# Patient Record
Sex: Male | Born: 1945 | Race: Black or African American | Hispanic: No | Marital: Married | Smoking: Former smoker
Health system: Southern US, Community
[De-identification: ages and names within clinical notes are randomized; demographics above are authoritative.]

## PROBLEM LIST (undated history)

## (undated) DIAGNOSIS — K219 Gastro-esophageal reflux disease without esophagitis: Secondary | ICD-10-CM

## (undated) DIAGNOSIS — Z860100 Personal history of colon polyps, unspecified: Secondary | ICD-10-CM

## (undated) DIAGNOSIS — F32A Depression, unspecified: Secondary | ICD-10-CM

## (undated) DIAGNOSIS — N400 Enlarged prostate without lower urinary tract symptoms: Secondary | ICD-10-CM

## (undated) DIAGNOSIS — F329 Major depressive disorder, single episode, unspecified: Secondary | ICD-10-CM

## (undated) DIAGNOSIS — K317 Polyp of stomach and duodenum: Secondary | ICD-10-CM

## (undated) DIAGNOSIS — K5790 Diverticulosis of intestine, part unspecified, without perforation or abscess without bleeding: Secondary | ICD-10-CM

## (undated) DIAGNOSIS — F419 Anxiety disorder, unspecified: Secondary | ICD-10-CM

## (undated) DIAGNOSIS — Z87442 Personal history of urinary calculi: Secondary | ICD-10-CM

## (undated) DIAGNOSIS — Z8719 Personal history of other diseases of the digestive system: Secondary | ICD-10-CM

## (undated) DIAGNOSIS — Z8679 Personal history of other diseases of the circulatory system: Secondary | ICD-10-CM

## (undated) DIAGNOSIS — E785 Hyperlipidemia, unspecified: Secondary | ICD-10-CM

## (undated) DIAGNOSIS — N529 Male erectile dysfunction, unspecified: Secondary | ICD-10-CM

## (undated) DIAGNOSIS — C61 Malignant neoplasm of prostate: Secondary | ICD-10-CM

## (undated) DIAGNOSIS — I10 Essential (primary) hypertension: Secondary | ICD-10-CM

## (undated) DIAGNOSIS — Z9889 Other specified postprocedural states: Secondary | ICD-10-CM

## (undated) HISTORY — DX: Personal history of colonic polyps: Z86.010

## (undated) HISTORY — DX: Major depressive disorder, single episode, unspecified: F32.9

## (undated) HISTORY — PX: KNEE ARTHROSCOPY: SUR90

## (undated) HISTORY — DX: Personal history of urinary calculi: Z87.442

## (undated) HISTORY — DX: Depression, unspecified: F32.A

## (undated) HISTORY — DX: Diverticulosis of intestine, part unspecified, without perforation or abscess without bleeding: K57.90

## (undated) HISTORY — DX: Hyperlipidemia, unspecified: E78.5

## (undated) HISTORY — DX: Personal history of colon polyps, unspecified: Z86.0100

## (undated) HISTORY — DX: Personal history of other diseases of the circulatory system: Z86.79

## (undated) HISTORY — DX: Gastro-esophageal reflux disease without esophagitis: K21.9

## (undated) HISTORY — DX: Benign prostatic hyperplasia without lower urinary tract symptoms: N40.0

## (undated) HISTORY — PX: ESOPHAGEAL DILATION: SHX303

## (undated) HISTORY — DX: Polyp of stomach and duodenum: K31.7

## (undated) HISTORY — DX: Other specified postprocedural states: Z98.890

## (undated) HISTORY — PX: TONSILLECTOMY: SUR1361

## (undated) HISTORY — DX: Anxiety disorder, unspecified: F41.9

## (undated) HISTORY — DX: Personal history of other diseases of the digestive system: Z87.19

## (undated) HISTORY — PX: CARPAL TUNNEL RELEASE: SHX101

## (undated) HISTORY — PX: INGUINAL HERNIA REPAIR: SUR1180

---

## 2001-09-07 ENCOUNTER — Emergency Department (HOSPITAL_COMMUNITY): Admission: EM | Admit: 2001-09-07 | Discharge: 2001-09-07 | Payer: Self-pay | Admitting: Emergency Medicine

## 2001-09-07 ENCOUNTER — Encounter: Payer: Self-pay | Admitting: Emergency Medicine

## 2002-06-15 ENCOUNTER — Ambulatory Visit (HOSPITAL_COMMUNITY): Admission: RE | Admit: 2002-06-15 | Discharge: 2002-06-15 | Payer: Self-pay | Admitting: Endocrinology

## 2002-06-15 ENCOUNTER — Encounter: Payer: Self-pay | Admitting: Endocrinology

## 2005-01-21 ENCOUNTER — Ambulatory Visit: Payer: Self-pay | Admitting: Internal Medicine

## 2005-01-25 ENCOUNTER — Ambulatory Visit (HOSPITAL_COMMUNITY): Admission: RE | Admit: 2005-01-25 | Discharge: 2005-01-25 | Payer: Self-pay | Admitting: Internal Medicine

## 2005-01-25 ENCOUNTER — Ambulatory Visit: Payer: Self-pay | Admitting: *Deleted

## 2005-01-25 ENCOUNTER — Ambulatory Visit: Payer: Self-pay

## 2005-04-15 ENCOUNTER — Ambulatory Visit: Payer: Self-pay | Admitting: Endocrinology

## 2005-04-22 ENCOUNTER — Ambulatory Visit: Payer: Self-pay | Admitting: Endocrinology

## 2005-05-12 ENCOUNTER — Ambulatory Visit: Payer: Self-pay | Admitting: Endocrinology

## 2005-06-27 ENCOUNTER — Ambulatory Visit: Payer: Self-pay | Admitting: Endocrinology

## 2006-02-20 ENCOUNTER — Ambulatory Visit: Payer: Self-pay | Admitting: Endocrinology

## 2006-04-19 ENCOUNTER — Ambulatory Visit: Payer: Self-pay | Admitting: Endocrinology

## 2006-04-25 ENCOUNTER — Ambulatory Visit: Payer: Self-pay | Admitting: Endocrinology

## 2007-02-19 ENCOUNTER — Ambulatory Visit: Payer: Self-pay | Admitting: Endocrinology

## 2007-02-20 ENCOUNTER — Ambulatory Visit: Payer: Self-pay | Admitting: Endocrinology

## 2007-02-20 LAB — CONVERTED CEMR LAB
ALT: 23 units/L (ref 0–40)
AST: 18 units/L (ref 0–37)
Albumin: 3.8 g/dL (ref 3.5–5.2)
Alkaline Phosphatase: 47 units/L (ref 39–117)
BUN: 12 mg/dL (ref 6–23)
Basophils Absolute: 0 10*3/uL (ref 0.0–0.1)
Basophils Relative: 1 % (ref 0.0–1.0)
Bilirubin Urine: NEGATIVE
Bilirubin, Direct: 0.2 mg/dL (ref 0.0–0.3)
CO2: 32 meq/L (ref 19–32)
Calcium: 9.3 mg/dL (ref 8.4–10.5)
Chloride: 102 meq/L (ref 96–112)
Cholesterol: 171 mg/dL (ref 0–200)
Creatinine, Ser: 1.1 mg/dL (ref 0.4–1.5)
Eosinophils Absolute: 0.2 10*3/uL (ref 0.0–0.6)
Eosinophils Relative: 4.8 % (ref 0.0–5.0)
GFR calc Af Amer: 88 mL/min
GFR calc non Af Amer: 72 mL/min
Glucose, Bld: 95 mg/dL (ref 70–99)
HCT: 40.8 % (ref 39.0–52.0)
HDL: 39.7 mg/dL (ref >39.0)
Hemoglobin, Urine: NEGATIVE
Hemoglobin: 14.3 g/dL (ref 13.0–17.0)
Ketones, ur: NEGATIVE mg/dL
LDL Cholesterol: 104 mg/dL — ABNORMAL HIGH (ref 0–99)
Leukocytes, UA: NEGATIVE
Lymphocytes Relative: 24.4 % (ref 12.0–46.0)
MCHC: 35.1 g/dL (ref 30.0–36.0)
MCV: 82.6 fL (ref 78.0–100.0)
Monocytes Absolute: 0.3 10*3/uL (ref 0.2–0.7)
Monocytes Relative: 6.3 % (ref 3.0–11.0)
Neutro Abs: 2.8 10*3/uL (ref 1.4–7.7)
Neutrophils Relative %: 63.5 % (ref 43.0–77.0)
Nitrite: NEGATIVE
PSA: 2.25 ng/mL (ref 0.10–4.00)
Platelets: 178 10*3/uL (ref 150–400)
Potassium: 4.2 meq/L (ref 3.5–5.1)
RBC: 4.93 M/uL (ref 4.22–5.81)
RDW: 11.8 % (ref 11.5–14.6)
Sodium: 137 meq/L (ref 135–145)
Specific Gravity, Urine: 1.02 (ref 1.000–1.03)
TSH: 0.82 microintl units/mL (ref 0.35–5.50)
Total Bilirubin: 1 mg/dL (ref 0.3–1.2)
Total CHOL/HDL Ratio: 4.3
Total Protein, Urine: NEGATIVE mg/dL
Total Protein: 6.1 g/dL (ref 6.0–8.3)
Triglycerides: 139 mg/dL (ref 0–149)
Urine Glucose: NEGATIVE mg/dL
Urobilinogen, UA: 0.2 (ref 0.0–1.0)
VLDL: 28 mg/dL (ref 0–40)
Vitamin B-12: 762 pg/mL (ref 211–911)
WBC: 4.3 10*3/uL — ABNORMAL LOW (ref 4.5–10.5)
pH: 7 (ref 5.0–8.0)

## 2007-02-26 ENCOUNTER — Ambulatory Visit: Payer: Self-pay | Admitting: Cardiology

## 2007-05-02 ENCOUNTER — Ambulatory Visit: Payer: Self-pay | Admitting: Endocrinology

## 2007-05-02 LAB — CONVERTED CEMR LAB
ALT: 21 units/L (ref 0–40)
AST: 17 units/L (ref 0–37)
Albumin: 4.1 g/dL (ref 3.5–5.2)
Alkaline Phosphatase: 51 units/L (ref 39–117)
BUN: 13 mg/dL (ref 6–23)
Basophils Absolute: 0 10*3/uL (ref 0.0–0.1)
Basophils Relative: 0 % (ref 0.0–1.0)
Bilirubin Urine: NEGATIVE
Bilirubin, Direct: 0.2 mg/dL (ref 0.0–0.3)
CO2: 32 meq/L (ref 19–32)
Calcium: 9.4 mg/dL (ref 8.4–10.5)
Chloride: 104 meq/L (ref 96–112)
Cholesterol: 177 mg/dL (ref 0–200)
Creatinine, Ser: 1.1 mg/dL (ref 0.4–1.5)
Eosinophils Absolute: 0.2 10*3/uL (ref 0.0–0.6)
Eosinophils Relative: 4.3 % (ref 0.0–5.0)
GFR calc Af Amer: 88 mL/min
GFR calc non Af Amer: 72 mL/min
Glucose, Bld: 93 mg/dL (ref 70–99)
HCT: 42.2 % (ref 39.0–52.0)
HDL: 42.9 mg/dL (ref >39.0)
Hemoglobin, Urine: NEGATIVE
Hemoglobin: 14.6 g/dL (ref 13.0–17.0)
Ketones, ur: NEGATIVE mg/dL
LDL Cholesterol: 112 mg/dL — ABNORMAL HIGH (ref 0–99)
Leukocytes, UA: NEGATIVE
Lymphocytes Relative: 32.6 % (ref 12.0–46.0)
MCHC: 34.7 g/dL (ref 30.0–36.0)
MCV: 83.4 fL (ref 78.0–100.0)
Monocytes Absolute: 0.3 10*3/uL (ref 0.2–0.7)
Monocytes Relative: 7.7 % (ref 3.0–11.0)
Neutro Abs: 2.1 10*3/uL (ref 1.4–7.7)
Neutrophils Relative %: 55.4 % (ref 43.0–77.0)
Nitrite: NEGATIVE
Platelets: 206 10*3/uL (ref 150–400)
Potassium: 4.4 meq/L (ref 3.5–5.1)
RBC: 5.05 M/uL (ref 4.22–5.81)
RDW: 11.8 % (ref 11.5–14.6)
Sodium: 140 meq/L (ref 135–145)
Specific Gravity, Urine: 1.025 (ref 1.000–1.03)
TSH: 1.18 microintl units/mL (ref 0.35–5.50)
Total Bilirubin: 0.9 mg/dL (ref 0.3–1.2)
Total CHOL/HDL Ratio: 4.1
Total Protein, Urine: NEGATIVE mg/dL
Total Protein: 6.3 g/dL (ref 6.0–8.3)
Triglycerides: 113 mg/dL (ref 0–149)
Urine Glucose: NEGATIVE mg/dL
Urobilinogen, UA: 0.2 (ref 0.0–1.0)
VLDL: 23 mg/dL (ref 0–40)
WBC: 3.8 10*3/uL — ABNORMAL LOW (ref 4.5–10.5)
pH: 6.5 (ref 5.0–8.0)

## 2007-05-07 ENCOUNTER — Ambulatory Visit: Payer: Self-pay | Admitting: Endocrinology

## 2007-07-16 ENCOUNTER — Encounter: Payer: Self-pay | Admitting: Endocrinology

## 2007-07-16 DIAGNOSIS — Z860100 Personal history of colon polyps, unspecified: Secondary | ICD-10-CM | POA: Insufficient documentation

## 2007-07-16 DIAGNOSIS — F329 Major depressive disorder, single episode, unspecified: Secondary | ICD-10-CM

## 2007-07-16 DIAGNOSIS — E785 Hyperlipidemia, unspecified: Secondary | ICD-10-CM

## 2007-09-10 ENCOUNTER — Ambulatory Visit: Payer: Self-pay | Admitting: Endocrinology

## 2007-10-02 ENCOUNTER — Telehealth (INDEPENDENT_AMBULATORY_CARE_PROVIDER_SITE_OTHER): Payer: Self-pay | Admitting: *Deleted

## 2007-10-09 ENCOUNTER — Telehealth: Payer: Self-pay | Admitting: Endocrinology

## 2007-10-09 DIAGNOSIS — K219 Gastro-esophageal reflux disease without esophagitis: Secondary | ICD-10-CM | POA: Insufficient documentation

## 2007-10-26 ENCOUNTER — Ambulatory Visit: Payer: Self-pay | Admitting: Gastroenterology

## 2007-10-31 ENCOUNTER — Encounter: Payer: Self-pay | Admitting: Gastroenterology

## 2007-10-31 ENCOUNTER — Ambulatory Visit: Payer: Self-pay | Admitting: Gastroenterology

## 2007-10-31 DIAGNOSIS — Z8719 Personal history of other diseases of the digestive system: Secondary | ICD-10-CM

## 2007-10-31 HISTORY — DX: Personal history of other diseases of the digestive system: Z87.19

## 2008-01-30 ENCOUNTER — Ambulatory Visit: Payer: Self-pay | Admitting: Endocrinology

## 2008-05-06 ENCOUNTER — Ambulatory Visit: Payer: Self-pay | Admitting: Endocrinology

## 2008-05-07 LAB — CONVERTED CEMR LAB
ALT: 27 U/L
AST: 21 U/L
Albumin: 3.8 g/dL
Alkaline Phosphatase: 44 U/L
BUN: 12 mg/dL
Basophils Absolute: 0 10*3/uL
Basophils Relative: 0.3 %
Bilirubin Urine: NEGATIVE
Bilirubin, Direct: 0.1 mg/dL
CO2: 31 meq/L
Calcium: 9.4 mg/dL
Chloride: 102 meq/L
Cholesterol: 145 mg/dL
Creatinine, Ser: 1.3 mg/dL
Eosinophils Absolute: 0.1 10*3/uL
Eosinophils Relative: 5.5 % — ABNORMAL HIGH
GFR calc Af Amer: 72 mL/min
GFR calc non Af Amer: 59 mL/min
Glucose, Bld: 97 mg/dL
HCT: 42.2 %
HDL: 36.6 mg/dL — ABNORMAL LOW
Hemoglobin, Urine: NEGATIVE
Hemoglobin: 14.5 g/dL
Ketones, ur: NEGATIVE mg/dL
LDL Cholesterol: 91 mg/dL
Leukocytes, UA: NEGATIVE
Lymphocytes Relative: 42 %
MCHC: 34.2 g/dL
MCV: 85.3 fL
Monocytes Absolute: 0.3 10*3/uL
Monocytes Relative: 9.5 %
Neutro Abs: 1.2 10*3/uL — ABNORMAL LOW
Neutrophils Relative %: 42.7 % — ABNORMAL LOW
Nitrite: NEGATIVE
PSA: 2.52 ng/mL
Platelets: 186 10*3/uL
Potassium: 4.5 meq/L
RBC: 4.95 M/uL
RDW: 12 %
Sodium: 142 meq/L
Specific Gravity, Urine: 1.015
TSH: 1.06 u[IU]/mL
Total Bilirubin: 1.1 mg/dL
Total CHOL/HDL Ratio: 4
Total Protein, Urine: NEGATIVE mg/dL
Total Protein: 6.1 g/dL
Triglycerides: 87 mg/dL
Urine Glucose: NEGATIVE mg/dL
Urobilinogen, UA: 0.2
VLDL: 17 mg/dL
WBC: 2.7 10*3/uL — ABNORMAL LOW
pH: 6.5

## 2008-05-15 ENCOUNTER — Ambulatory Visit: Payer: Self-pay | Admitting: Endocrinology

## 2008-06-11 ENCOUNTER — Ambulatory Visit: Payer: Self-pay | Admitting: Gastroenterology

## 2008-06-25 ENCOUNTER — Ambulatory Visit: Payer: Self-pay | Admitting: Gastroenterology

## 2009-05-22 ENCOUNTER — Ambulatory Visit: Payer: Self-pay | Admitting: Endocrinology

## 2009-05-23 LAB — CONVERTED CEMR LAB
ALT: 19 units/L (ref 0–53)
AST: 18 units/L (ref 0–37)
Albumin: 4.2 g/dL (ref 3.5–5.2)
Alkaline Phosphatase: 55 units/L (ref 39–117)
BUN: 13 mg/dL (ref 6–23)
Basophils Absolute: 0 10*3/uL (ref 0.0–0.1)
Basophils Relative: 1 % (ref 0.0–3.0)
Bilirubin Urine: NEGATIVE
Bilirubin, Direct: 0.2 mg/dL (ref 0.0–0.3)
CO2: 31 meq/L (ref 19–32)
Calcium: 9.3 mg/dL (ref 8.4–10.5)
Chloride: 103 meq/L (ref 96–112)
Cholesterol: 157 mg/dL (ref 0–200)
Creatinine, Ser: 1.2 mg/dL (ref 0.4–1.5)
Eosinophils Absolute: 0.1 10*3/uL (ref 0.0–0.7)
Eosinophils Relative: 4.6 % (ref 0.0–5.0)
GFR calc non Af Amer: 78.56 mL/min (ref >60)
Glucose, Bld: 85 mg/dL (ref 70–99)
HCT: 44.1 % (ref 39.0–52.0)
HDL: 39.3 mg/dL (ref >39.00)
Hemoglobin, Urine: NEGATIVE
Hemoglobin: 15.2 g/dL (ref 13.0–17.0)
Ketones, ur: NEGATIVE mg/dL
LDL Cholesterol: 92 mg/dL (ref 0–99)
Leukocytes, UA: NEGATIVE
Lymphocytes Relative: 37.7 % (ref 12.0–46.0)
Lymphs Abs: 1.1 10*3/uL (ref 0.7–4.0)
MCHC: 34.5 g/dL (ref 30.0–36.0)
MCV: 84 fL (ref 78.0–100.0)
Monocytes Absolute: 0.3 10*3/uL (ref 0.1–1.0)
Monocytes Relative: 8.7 % (ref 3.0–12.0)
Neutro Abs: 1.4 10*3/uL (ref 1.4–7.7)
Neutrophils Relative %: 48 % (ref 43.0–77.0)
Nitrite: NEGATIVE
PSA: 3.71 ng/mL (ref 0.10–4.00)
Platelets: 171 10*3/uL (ref 150.0–400.0)
Potassium: 4.2 meq/L (ref 3.5–5.1)
RBC: 5.25 M/uL (ref 4.22–5.81)
RDW: 11.8 % (ref 11.5–14.6)
Sodium: 142 meq/L (ref 135–145)
Specific Gravity, Urine: 1.02 (ref 1.000–1.030)
TSH: 0.84 microintl units/mL (ref 0.35–5.50)
Total Bilirubin: 1.4 mg/dL — ABNORMAL HIGH (ref 0.3–1.2)
Total CHOL/HDL Ratio: 4
Total Protein, Urine: NEGATIVE mg/dL
Total Protein: 6.7 g/dL (ref 6.0–8.3)
Triglycerides: 129 mg/dL (ref 0.0–149.0)
Urine Glucose: NEGATIVE mg/dL
Urobilinogen, UA: 0.2 (ref 0.0–1.0)
VLDL: 25.8 mg/dL (ref 0.0–40.0)
WBC: 2.9 10*3/uL — ABNORMAL LOW (ref 4.5–10.5)
pH: 6 (ref 5.0–8.0)

## 2009-05-27 ENCOUNTER — Ambulatory Visit: Payer: Self-pay | Admitting: Endocrinology

## 2009-05-27 DIAGNOSIS — D72819 Decreased white blood cell count, unspecified: Secondary | ICD-10-CM | POA: Insufficient documentation

## 2009-08-17 ENCOUNTER — Ambulatory Visit: Payer: Self-pay | Admitting: Endocrinology

## 2009-08-17 DIAGNOSIS — H8110 Benign paroxysmal vertigo, unspecified ear: Secondary | ICD-10-CM

## 2009-08-17 LAB — CONVERTED CEMR LAB
Basophils Relative: 0 % (ref 0.0–3.0)
CO2: 32 meq/L (ref 19–32)
Chloride: 105 meq/L (ref 96–112)
Eosinophils Relative: 6 % — ABNORMAL HIGH (ref 0.0–5.0)
Glucose, Bld: 91 mg/dL (ref 70–99)
Monocytes Relative: 9.7 % (ref 3.0–12.0)
Neutrophils Relative %: 39.4 % — ABNORMAL LOW (ref 43.0–77.0)
Platelets: 175 10*3/uL (ref 150.0–400.0)
Potassium: 4.1 meq/L (ref 3.5–5.1)
RBC: 5.26 M/uL (ref 4.22–5.81)
Sodium: 140 meq/L (ref 135–145)
WBC: 3.3 10*3/uL — ABNORMAL LOW (ref 4.5–10.5)

## 2009-09-01 ENCOUNTER — Ambulatory Visit: Payer: Self-pay | Admitting: Endocrinology

## 2009-09-01 LAB — CONVERTED CEMR LAB
Basophils Absolute: 0 10*3/uL (ref 0.0–0.1)
Eosinophils Absolute: 0.2 10*3/uL (ref 0.0–0.7)
HCT: 42.4 % (ref 39.0–52.0)
Hemoglobin: 14.3 g/dL (ref 13.0–17.0)
Lymphs Abs: 1.2 10*3/uL (ref 0.7–4.0)
MCHC: 33.8 g/dL (ref 30.0–36.0)
MCV: 84.5 fL (ref 78.0–100.0)
Monocytes Absolute: 0.3 10*3/uL (ref 0.1–1.0)
Monocytes Relative: 9.6 % (ref 3.0–12.0)
Neutro Abs: 1.5 10*3/uL (ref 1.4–7.7)
RDW: 12.6 % (ref 11.5–14.6)

## 2010-05-19 ENCOUNTER — Ambulatory Visit: Payer: Self-pay | Admitting: Endocrinology

## 2010-05-20 LAB — CONVERTED CEMR LAB
ALT: 16 units/L (ref 0–53)
AST: 18 units/L (ref 0–37)
Albumin: 4.2 g/dL (ref 3.5–5.2)
Basophils Absolute: 0 10*3/uL (ref 0.0–0.1)
Bilirubin Urine: NEGATIVE
Eosinophils Relative: 5.6 % — ABNORMAL HIGH (ref 0.0–5.0)
GFR calc non Af Amer: 72.69 mL/min (ref >60)
Glucose, Bld: 90 mg/dL (ref 70–99)
HCT: 41.4 % (ref 39.0–52.0)
Hemoglobin: 14.3 g/dL (ref 13.0–17.0)
Ketones, ur: NEGATIVE mg/dL
Leukocytes, UA: NEGATIVE
Lymphs Abs: 1.2 10*3/uL (ref 0.7–4.0)
Monocytes Relative: 9.2 % (ref 3.0–12.0)
Neutro Abs: 1.4 10*3/uL (ref 1.4–7.7)
Potassium: 4.6 meq/L (ref 3.5–5.1)
RDW: 13.6 % (ref 11.5–14.6)
Sodium: 139 meq/L (ref 135–145)
TSH: 0.8 microintl units/mL (ref 0.35–5.50)
Urobilinogen, UA: 0.2 (ref 0.0–1.0)
VLDL: 39.6 mg/dL (ref 0.0–40.0)

## 2010-05-28 ENCOUNTER — Ambulatory Visit: Payer: Self-pay | Admitting: Endocrinology

## 2010-07-26 ENCOUNTER — Ambulatory Visit: Payer: Self-pay | Admitting: Endocrinology

## 2010-08-03 ENCOUNTER — Telehealth: Payer: Self-pay | Admitting: Endocrinology

## 2011-01-06 NOTE — Assessment & Plan Note (Signed)
Summary: cpx /bcbs / #/ cd   Vital Signs:  Patient profile:   65 year old male Height:      75 inches (190.50 cm) Weight:      210 pounds (95.45 kg) BMI:     26.34 O2 Sat:      96 % on Room air Temp:     97.9 degrees F (36.61 degrees C) oral Pulse rate:   79 / minute BP sitting:   114 / 74  (left arm) Cuff size:   large  Vitals Entered By: Brenton Grills MA (May 28, 2010 10:18 AM)  O2 Flow:  Room air CC: CPX/aj   CC:  CPX/aj.  History of Present Illness: here for regular wellness examination.  He's feeling pretty well in general, and does not drink or smoke.   Current Medications (verified): 1)  Zocor 80 Mg  Tabs (Simvastatin) .... Take 1 By Mouth At Bedtime 2)  Multivitamins   Tabs (Multiple Vitamin) .... Take 1 By Mouth Qd 3)  Viagra 100 Mg  Tabs (Sildenafil Citrate) .... As Needed Use 4)  Nexium 40 Mg  Cpdr (Esomeprazole Magnesium) .... Qd 5)  Flomax 0.4 Mg  Cp24 (Tamsulosin Hcl) .... Qd 6)  Alprazolam 0.25 Mg Tabs (Alprazolam) .Marland Kitchen.. 1 As Needed Anxiety  Allergies (verified): 1)  ! Sulfa  Past History:  Past Medical History: Last updated: 01/02/2008 Colonic polyps, hx of Depression Hyperlipidemia Decreased WBC Carpal tunnel syndrome Prostatism BPH E.D. Gean Maidens D  Family History: Reviewed history from 05/15/2008 and no changes required. mother had ovarian cancer  Social History: Reviewed history from 05/15/2008 and no changes required. married church pastor  Review of Systems       The patient complains of weight gain.  The patient denies fever, vision loss, decreased hearing, chest pain, syncope, dyspnea on exertion, prolonged cough, headaches, abdominal pain, melena, hematochezia, severe indigestion/heartburn, hematuria, suspicious skin lesions, and depression.    Physical Exam  General:  normal appearance.   Head:  head: no deformity eyes: no periorbital swelling, no proptosis external nose and ears are normal mouth: no lesion seen Neck:   Supple without thyroid enlargement or tenderness.  Lungs:  Clear to auscultation bilaterally. Normal respiratory effort.  Heart:  Regular rate and rhythm without murmurs or gallops noted. Normal S1,S2.   Abdomen:  abdomen is soft, nontender.  no hepatosplenomegaly.   not distended.  no hernia  Rectal:  normal external and internal exam.  heme neg  Prostate:  Normal size prostate without masses or tenderness.  Msk:  muscle bulk and strength are grossly normal.  no obvious joint swelling.  gait is normal and steady  Pulses:  dorsalis pedis intact bilat.  no carotid bruit  Extremities:  no deformity.  no ulcer on the feet.  feet are of normal color and temp.  no edema mycotic toenails.   there are healed abrasions (hypopigmented) at the legs. Neurologic:  cn 2-12 grossly intact.   readily moves all 4's.   sensation is intact to touch on the feet  Skin:  normal texture and temp.  no rash.  not diaphoretic  Cervical Nodes:  No significant adenopathy.  Psych:  Alert and cooperative; normal mood and affect; normal attention span and concentration.     Impression & Recommendations:  Problem # 1:  ROUTINE GENERAL MEDICAL EXAM@HEALTH  CARE FACL (ICD-V70.0)  Other Orders: Est. Patient 40-64 years (13086)  Patient Instructions: 1)  please consider these measures for your health:  minimize alcohol.  do not use tobacco products.  have a colonoscopy at least every 10 years from age 7.  keep firearms safely stored.  always use seat belts.  have working smoke alarms in your home.  see the dentist regularly.  never drive under the influence of alcohol or drugs (including prescription drugs).  2)  please let me know what your wishes would be, if artificial life support measures should become necessary.  it is critically important to prevent falling down (keep floor areas well-lit, dry, and free of loose objects) 3)  it is critically important to maintain a healthy body weight.  i would be happy to  refer you to a dietician.  we are checking for diabetes and cholesterol today.

## 2011-01-06 NOTE — Progress Notes (Signed)
Summary: Rx refill req  Phone Note Call from Patient Call back at Home Phone [phone]   Caller: Patient Summary of Call: Pt is requesting refills of Cialis 20mg  to [facility] [address] Initial call taken by: Margaret Pyle, CMA,  August 03, 2010 3:24 PM  Follow-up for Phone Call        sent Follow-up by: Minus Breeding MD,  August 03, 2010 4:05 PM  Additional Follow-up for Phone Call Additional follow up Details #1::        Pt's spouse informed Additional Follow-up by: Margaret Pyle, CMA,  August 03, 2010 4:15 PM    Prescriptions: CIALIS 20 MG TABS (TADALAFIL) for as needed use  #5 x 11   Entered and Authorized by:   Minus Breeding MD   Signed by:   Minus Breeding MD on 08/03/2010   Method used:   Electronically to        [facility] (retail)       [address]       [place], [place]  [address]       Ph: [phone]       Fax: [phone]   RxID:   [id number]

## 2011-01-06 NOTE — Assessment & Plan Note (Signed)
Summary: ?ulcers-lb   Vital Signs:  Patient profile:   65 year old male Height:      75 inches (190.50 cm) Weight:      207 pounds (94.09 kg) BMI:     25.97 O2 Sat:      95 % on Room air Temp:     97.9 degrees F (36.61 degrees C) oral Pulse rate:   72 / minute BP sitting:   122 / 74  (left arm) Cuff size:   large  Vitals Entered By: Brenton Grills MA (July 26, 2010 2:18 PM)  O2 Flow:  Room air CC: Ulcers/aj   CC:  Ulcers/aj.  History of Present Illness: pt states 1 year of intermittent bitter taste in his mouth, and associated nausea, in the context of eating spicy food.  no dysphagia.  he takes nexuim as rx'ed.    Current Medications (verified): 1)  Zocor 80 Mg  Tabs (Simvastatin) .... Take 1 By Mouth At Bedtime 2)  Multivitamins   Tabs (Multiple Vitamin) .... Take 1 By Mouth Qd 3)  Viagra 100 Mg  Tabs (Sildenafil Citrate) .... As Needed Use 4)  Nexium 40 Mg  Cpdr (Esomeprazole Magnesium) .... Qd 5)  Flomax 0.4 Mg  Cp24 (Tamsulosin Hcl) .... Qd 6)  Alprazolam 0.25 Mg Tabs (Alprazolam) .Marland Kitchen.. 1 As Needed Anxiety  Allergies (verified): 1)  ! Sulfa  Past History:  Past Medical History: Last updated: 01/02/2008 Colonic polyps, hx of Depression Hyperlipidemia Decreased WBC Carpal tunnel syndrome Prostatism BPH E.D. Loni Beckwith R D  Review of Systems  The patient denies abdominal pain, hematochezia, weight loss, and weight gain.         no vomiting.  Physical Exam  General:  normal appearance.   Abdomen:  abdomen is soft, nontender.  no hepatosplenomegaly.   not distended.  no hernia    Impression & Recommendations:  Problem # 1:  ESOPHAGEAL REFLUX (ICD-530.81) Assessment Deteriorated  Medications Added to Medication List This Visit: 1)  Nexium 40 Mg Cpdr (Esomeprazole magnesium) .Marland Kitchen.. 1 tab two times a day 2)  Cialis 20 Mg Tabs (Tadalafil) .... For as needed use  Other Orders: Gastroenterology Referral (GI) Est. Patient Level III ([address])  Patient  Instructions: 1)  refer back to dr Arlyce Dice.   you will be called with a day and time for an appointment. 2)  pending that day, increase nexium to 40 mg two times a day.

## 2011-01-13 ENCOUNTER — Encounter: Payer: Self-pay | Admitting: Endocrinology

## 2011-01-13 ENCOUNTER — Ambulatory Visit (INDEPENDENT_AMBULATORY_CARE_PROVIDER_SITE_OTHER): Payer: Medicare Other | Admitting: Endocrinology

## 2011-01-13 DIAGNOSIS — M259 Joint disorder, unspecified: Secondary | ICD-10-CM

## 2011-01-20 NOTE — Assessment & Plan Note (Signed)
Summary: ?athlete's foot   Vital Signs:  Patient profile:   65 year old male Height:      75 inches (190.50 cm) Weight:      208.25 pounds (94.66 kg) BMI:     26.12 O2 Sat:      95 % on Room air Temp:     98.7 degrees F (37.06 degrees C) oral Pulse rate:   80 / minute BP sitting:   110 / 82  (left arm) Cuff size:   large  Vitals Entered By: Brenton Grills CMA Duncan Dull) (January 13, 2011 3:34 PM)  O2 Flow:  Room air CC: Athlete's foot (L Foot)?/aj Is Patient Diabetic? No Comments pt declined flu shot   CC:  Athlete's foot (L Foot)?/aj.  History of Present Illness: pt states few weeks of burning of the left foot, but no assoc slight rash.  despite no rash, he thinks this could be athlete's foot.   he says cialis helps, but is too expensive.    Current Medications (verified): 1)  Zocor 80 Mg  Tabs (Simvastatin) .... Take 1 By Mouth At Bedtime 2)  Multivitamins   Tabs (Multiple Vitamin) .... Take 1 By Mouth Qd 3)  Nexium 40 Mg  Cpdr (Esomeprazole Magnesium) .Marland Kitchen.. 1 Tab Two Times A Day 4)  Flomax 0.4 Mg  Cp24 (Tamsulosin Hcl) .... Qd 5)  Alprazolam 0.25 Mg Tabs (Alprazolam) .Marland Kitchen.. 1 As Needed Anxiety 6)  Cialis 20 Mg Tabs (Tadalafil) .... For As Needed Use  Allergies (verified): 1)  ! Sulfa  Past History:  Past Medical History: Last updated: 01/02/2008 Colonic polyps, hx of Depression Hyperlipidemia Decreased WBC Carpal tunnel syndrome Prostatism BPH E.D. Loni Beckwith R D  Review of Systems       no itching  Physical Exam  General:  normal appearance.   Extremities:  no deformity.  no ulcer on the left foot.  feet are of normal color and temp.  no edema mycotic toenails.     Impression & Recommendations:  Problem # 1:  UNSPECIFIED DISORDER OF ANKLE AND FOOT JOINT (ICD-719.97) Assessment New uncertain etiology  Problem # 2:  ed he needs some adjustment in his therapy  Medications Added to Medication List This Visit: 1)  Clotrimazole-betamethasone 1-0.05 % Crea  (Clotrimazole-betamethasone) .... Three times a day as needed for foot symptoms. 2)  Levitra 20 Mg Tabs (Vardenafil hcl) .... For as needed use  Other Orders: Est. Patient Level III ([address])  Patient Instructions: 1)  trial of this cream three times a day as needed for the symptoms. 2)  call next week if you are not better, and i would be happy to refer you to a foot specialist. 3)  chenge cialis to levitra, for as needed use Prescriptions: LEVITRA 20 MG TABS (VARDENAFIL HCL) for as needed use  #5 x 11   Entered and Authorized by:   Minus Breeding MD   Signed by:   Minus Breeding MD on 01/13/2011   Method used:   Electronically to        [facility] (retail)       [address]       [place], [place]  [address]       Ph: [phone]       Fax: [phone]   RxID:   [id number] CLOTRIMAZOLE-BETAMETHASONE 1-0.05 % CREA (CLOTRIMAZOLE-BETAMETHASONE) three times a day as needed for foot symptoms.  #1 med tube  x 1   Entered and Authorized by:   Minus Breeding MD   Signed by:   Minus Breeding MD on 01/13/2011   Method used:   Electronically to        [facility] (retail)       [address]       [place], [place]  [address]       Ph: [phone]       Fax: [phone]   RxID:   [id number]    Orders Added: 1)  Est. Patient Level III [[address]]

## 2011-03-29 ENCOUNTER — Ambulatory Visit (INDEPENDENT_AMBULATORY_CARE_PROVIDER_SITE_OTHER): Payer: Medicare Other | Admitting: Endocrinology

## 2011-03-29 ENCOUNTER — Other Ambulatory Visit (INDEPENDENT_AMBULATORY_CARE_PROVIDER_SITE_OTHER): Payer: Medicare Other

## 2011-03-29 ENCOUNTER — Encounter: Payer: Self-pay | Admitting: Endocrinology

## 2011-03-29 DIAGNOSIS — E785 Hyperlipidemia, unspecified: Secondary | ICD-10-CM

## 2011-03-29 DIAGNOSIS — D72819 Decreased white blood cell count, unspecified: Secondary | ICD-10-CM

## 2011-03-29 DIAGNOSIS — R109 Unspecified abdominal pain: Secondary | ICD-10-CM

## 2011-03-29 DIAGNOSIS — R972 Elevated prostate specific antigen [PSA]: Secondary | ICD-10-CM

## 2011-03-29 DIAGNOSIS — R079 Chest pain, unspecified: Secondary | ICD-10-CM

## 2011-03-29 DIAGNOSIS — N400 Enlarged prostate without lower urinary tract symptoms: Secondary | ICD-10-CM

## 2011-03-29 DIAGNOSIS — K219 Gastro-esophageal reflux disease without esophagitis: Secondary | ICD-10-CM

## 2011-03-29 LAB — CBC WITH DIFFERENTIAL/PLATELET
Eosinophils Relative: 6.8 % — ABNORMAL HIGH (ref 0.0–5.0)
HCT: 42.4 % (ref 39.0–52.0)
Hemoglobin: 14.6 g/dL (ref 13.0–17.0)
Lymphs Abs: 1.3 10*3/uL (ref 0.7–4.0)
MCV: 83.6 fl (ref 78.0–100.0)
Monocytes Relative: 10.8 % (ref 3.0–12.0)
Neutro Abs: 1.1 10*3/uL — ABNORMAL LOW (ref 1.4–7.7)
WBC: 3 10*3/uL — ABNORMAL LOW (ref 4.5–10.5)

## 2011-03-29 LAB — LIPID PANEL
LDL Cholesterol: 94 mg/dL (ref 0–99)
Total CHOL/HDL Ratio: 4
VLDL: 16.4 mg/dL (ref 0.0–40.0)

## 2011-03-29 LAB — BASIC METABOLIC PANEL
CO2: 31 mEq/L (ref 19–32)
Chloride: 105 mEq/L (ref 96–112)
GFR: 69.36 mL/min (ref >60.00)
Glucose, Bld: 81 mg/dL (ref 70–99)
Potassium: 4.5 mEq/L (ref 3.5–5.1)
Sodium: 141 mEq/L (ref 135–145)

## 2011-03-29 LAB — HEPATIC FUNCTION PANEL
Bilirubin, Direct: 0.1 mg/dL (ref 0.0–0.3)
Total Bilirubin: 0.7 mg/dL (ref 0.3–1.2)

## 2011-03-29 LAB — TSH: TSH: 0.74 u[IU]/mL (ref 0.35–5.50)

## 2011-03-29 NOTE — Patient Instructions (Addendum)
Refer back to dr Arlyce Dice.  you will be called with a day and time for an appointment For now, increase nexium to 40 mg 2x a day. blood tests are being ordered for you today.  please call [phone] to hear your test results. Please make a "medicare wellness" appointment in 2 months. (update: i left message on phone-tree:  Ref urol.  you will be called with a day and time for an appointment)

## 2011-03-29 NOTE — Progress Notes (Signed)
  Subjective:    Patient ID: Omar [name], male    DOB: May 09, 1946, 65 y.o.   MRN: [id number]  HPI Pt states few mos of intermittent "sensation," generalized throughout the abdomen, and assoc "bitter taste in my mouth."  No weight loss.  No help with nexium. He also has 2 mos of intermittent chest pain lasting 1 second at a time, with twisting of the torso. Past Medical History  Diagnosis Date  . Depression   . Hyperlipidemia   . GERD (gastroesophageal reflux disease)   . History of colonic polyps   . Decreased white blood cell count   . Carpal tunnel syndrome   . Prostatism   . BPH (benign prostatic hypertrophy)   . ED (erectile dysfunction)    No past surgical history on file.  reports that he has quit smoking. He does not have any smokeless tobacco history on file. His alcohol and drug histories not on file. family history includes Cancer in his mother. Allergies  Allergen Reactions  . Sulfonamide Derivatives     REACTION: blisters on skin    Review of Systems Denies brbpr and dysphagia.    Objective:   Physical Exam GENERAL: no distress Chest wall:  nontender LUNGS:  Clear to auscultation ABDOMEN: abdomen is soft, nontender.  no hepatosplenomegaly.   not distended.  no hernia RECTAL: normal external and internal exam.  heme neg     Lab Results  Component Value Date   WBC 3.0* 03/29/2011   HGB 14.6 03/29/2011   HCT 42.4 03/29/2011   PLT 174.0 03/29/2011   CHOL 152 03/29/2011   TRIG 82.0 03/29/2011   HDL 42.00 03/29/2011   ALT 17 03/29/2011   AST 15 03/29/2011   NA 141 03/29/2011   K 4.5 03/29/2011   CL 105 03/29/2011   CREATININE 1.3 03/29/2011   BUN 13 03/29/2011   CO2 31 03/29/2011   TSH 0.74 03/29/2011   PSA 4.95* 03/29/2011      Assessment & Plan:  abd "sensation," prob due to gerd Chest-wall pain, new elev psa, new

## 2011-04-04 ENCOUNTER — Encounter: Payer: Self-pay | Admitting: Endocrinology

## 2011-04-19 NOTE — Assessment & Plan Note (Signed)
[facility]                         GASTROENTEROLOGY OFFICE NOTE   NAME:Omar [name], Omar [name]                        MRN:          [id number]  DATE:10/26/2007                            DOB:          02-Oct-1946    REASON FOR CONSULTATION:  Indigestion.   [name] is a pleasant 65 year old African-American male referred  through the courtesy of Dr. Everardo All for evaluation.  Despite taking  daily Nexium, he has frequent breakthrough pyrosis.  He has had  regurgitation of gastric contents.  He denies dysphagia, odynophagia,  cough, or throat pain.  He is on no gastric irritants including  nonsteroidals.  Since switching to Zegerid, he notes some improvement of  his symptoms.   PAST MEDICAL HISTORY:  Unremarkable.   FAMILY HISTORY:  Noncontributory.   MEDICATIONS:  1. Zocor.  2. Nexium.  3. Multivitamin.   He is allergic to SULFA.   He neither smokes nor drinks.  He is married and works as a Education officer, environmental.   REVIEW OF SYSTEMS:  Reviewed and was negative.  He had a colonoscopy in  1999 where internal hemorrhoids were seen.   PHYSICAL EXAMINATION:  He is a healthy-appearing male.  Pulse 72, blood pressure 120/80, weight 200.  HEENT: EOMI.  PERRLA.  Sclerae are anicteric.  Conjunctivae are pink.  NECK:  Supple without thyromegaly, adenopathy or carotid bruits.  CHEST:  Clear to auscultation and percussion without adventitious  sounds.  CARDIAC:  Regular rhythm; normal S1 S2.  There are no murmurs, gallops  or rubs.  ABDOMEN:  Bowel sounds are normoactive.  Abdomen is soft, nontender and  nondistended.  There are no abdominal masses, tenderness, splenic  enlargement or hepatomegaly.  EXTREMITIES:  Full range of motion.  No cyanosis, clubbing or edema.  RECTAL:  Deferred.   IMPRESSION:  Persistent acid reflux symptoms despite proton pump  inhibitor therapy.   RECOMMENDATION:  1. Continue Zegerid.  2. Upper endoscopy for evaluation and to rule out  Barrett's esophagus.  3. Followup colonoscopy sometime in 2009.     Barbette Hair. Arlyce Dice, MD,FACG  Electronically Signed    RDK/MedQ  DD: 10/26/2007  DT: 10/26/2007  Job #: [id number]

## 2011-05-26 ENCOUNTER — Emergency Department (HOSPITAL_COMMUNITY)
Admission: EM | Admit: 2011-05-26 | Discharge: 2011-05-26 | Disposition: A | Discharge status: Home / Self Care | Payer: Medicare Other | Attending: Emergency Medicine | Admitting: Emergency Medicine

## 2011-05-26 DIAGNOSIS — R12 Heartburn: Secondary | ICD-10-CM | POA: Insufficient documentation

## 2011-05-26 DIAGNOSIS — K219 Gastro-esophageal reflux disease without esophagitis: Secondary | ICD-10-CM | POA: Insufficient documentation

## 2011-05-26 DIAGNOSIS — R109 Unspecified abdominal pain: Secondary | ICD-10-CM | POA: Insufficient documentation

## 2011-05-26 DIAGNOSIS — Z79899 Other long term (current) drug therapy: Secondary | ICD-10-CM | POA: Insufficient documentation

## 2011-05-26 DIAGNOSIS — E7800 Pure hypercholesterolemia, unspecified: Secondary | ICD-10-CM | POA: Insufficient documentation

## 2011-05-26 LAB — DIFFERENTIAL
Basophils Absolute: 0 10*3/uL (ref 0.0–0.1)
Basophils Relative: 1 % (ref 0–1)
Lymphocytes Relative: 36 % (ref 12–46)
Monocytes Absolute: 0.3 10*3/uL (ref 0.1–1.0)
Monocytes Relative: 10 % (ref 3–12)
Neutro Abs: 1.4 10*3/uL — ABNORMAL LOW (ref 1.7–7.7)
Neutrophils Relative %: 47 % (ref 43–77)

## 2011-05-26 LAB — URINALYSIS, ROUTINE W REFLEX MICROSCOPIC
Glucose, UA: NEGATIVE mg/dL
Hgb urine dipstick: NEGATIVE
Leukocytes, UA: NEGATIVE
Protein, ur: NEGATIVE mg/dL
pH: 6 (ref 5.0–8.0)

## 2011-05-26 LAB — LIPASE, BLOOD: Lipase: 32 U/L (ref 11–59)

## 2011-05-26 LAB — COMPREHENSIVE METABOLIC PANEL
Albumin: 3.5 g/dL (ref 3.5–5.2)
Alkaline Phosphatase: 49 U/L (ref 39–117)
BUN: 12 mg/dL (ref 6–23)
Calcium: 9.1 mg/dL (ref 8.4–10.5)
Creatinine, Ser: 1.26 mg/dL (ref 0.50–1.35)
GFR calc Af Amer: >60 mL/min (ref >60)
Glucose, Bld: 99 mg/dL (ref 70–99)
Potassium: 4.3 mEq/L (ref 3.5–5.1)
Total Protein: 5.9 g/dL — ABNORMAL LOW (ref 6.0–8.3)

## 2011-05-26 LAB — CBC
HCT: 40.8 % (ref 39.0–52.0)
Hemoglobin: 14.3 g/dL (ref 13.0–17.0)
MCHC: 35 g/dL (ref 30.0–36.0)
RBC: 5.09 MIL/uL (ref 4.22–5.81)

## 2011-05-27 LAB — H. PYLORI ANTIBODY, IGG: H Pylori IgG: <0.4 {ISR}

## 2011-05-30 ENCOUNTER — Telehealth: Payer: Self-pay

## 2011-05-30 DIAGNOSIS — K219 Gastro-esophageal reflux disease without esophagitis: Secondary | ICD-10-CM

## 2011-05-30 NOTE — Telephone Encounter (Signed)
Pt advised and will expect a call from [facility] with appt info 

## 2011-05-30 NOTE — Telephone Encounter (Signed)
done

## 2011-05-30 NOTE — Telephone Encounter (Signed)
Pt came into clinic and filled out walk in form requesting GI referral per ER visit last Thursday.

## 2011-06-02 ENCOUNTER — Encounter: Payer: Self-pay | Admitting: Gastroenterology

## 2011-06-02 ENCOUNTER — Telehealth: Payer: Self-pay | Admitting: Endocrinology

## 2011-06-02 ENCOUNTER — Other Ambulatory Visit (INDEPENDENT_AMBULATORY_CARE_PROVIDER_SITE_OTHER): Payer: Medicare Other

## 2011-06-02 DIAGNOSIS — R109 Unspecified abdominal pain: Secondary | ICD-10-CM

## 2011-06-02 LAB — HEPATIC FUNCTION PANEL
ALT: 21 U/L (ref 0–53)
AST: 16 U/L (ref 0–37)
Albumin: 4.3 g/dL (ref 3.5–5.2)
Alkaline Phosphatase: 49 U/L (ref 39–117)
Total Bilirubin: 0.9 mg/dL (ref 0.3–1.2)

## 2011-06-02 LAB — CBC WITH DIFFERENTIAL/PLATELET
Basophils Relative: 0.5 % (ref 0.0–3.0)
Eosinophils Relative: 2.2 % (ref 0.0–5.0)
HCT: 43.4 % (ref 39.0–52.0)
Lymphs Abs: 1.1 10*3/uL (ref 0.7–4.0)
MCV: 83.4 fl (ref 78.0–100.0)
Monocytes Absolute: 0.4 10*3/uL (ref 0.1–1.0)
Monocytes Relative: 6.8 % (ref 3.0–12.0)
RBC: 5.2 Mil/uL (ref 4.22–5.81)
WBC: 6.3 10*3/uL (ref 4.5–10.5)

## 2011-06-02 LAB — BASIC METABOLIC PANEL
Chloride: 101 mEq/L (ref 96–112)
GFR: 78.82 mL/min (ref >60.00)
Potassium: 3.7 mEq/L (ref 3.5–5.1)
Sodium: 138 mEq/L (ref 135–145)

## 2011-06-02 LAB — AMYLASE: Amylase: 101 U/L (ref 27–131)

## 2011-06-02 NOTE — Telephone Encounter (Signed)
Pt is here with his wife's appointment.  He reports ongoing discomfort throughout the abdomen, nausea, and bitter taste in the mouth.  No help with increasing the nexium.  No weight loss. Labs and ct are ordered.  Pt advised

## 2011-06-10 ENCOUNTER — Ambulatory Visit (INDEPENDENT_AMBULATORY_CARE_PROVIDER_SITE_OTHER)
Admission: RE | Admit: 2011-06-10 | Discharge: 2011-06-10 | Disposition: A | Discharge status: Home / Self Care | Payer: Medicare Other | Source: Ambulatory Visit | Attending: Endocrinology | Admitting: Endocrinology

## 2011-06-10 DIAGNOSIS — R109 Unspecified abdominal pain: Secondary | ICD-10-CM

## 2011-06-10 MED ORDER — IOHEXOL 300 MG/ML  SOLN
100.0000 mL | Freq: Once | INTRAMUSCULAR | Status: AC | PRN
  Administered 2011-06-10: 100 mL via INTRAVENOUS

## 2011-06-20 ENCOUNTER — Other Ambulatory Visit: Payer: Self-pay | Admitting: Endocrinology

## 2011-08-01 ENCOUNTER — Ambulatory Visit: Payer: Medicare Other | Admitting: Gastroenterology

## 2011-09-27 ENCOUNTER — Other Ambulatory Visit: Payer: Self-pay | Admitting: *Deleted

## 2011-09-27 MED ORDER — ESOMEPRAZOLE MAGNESIUM 40 MG PO CPDR: 40.0000 mg | DELAYED_RELEASE_CAPSULE | Freq: Two times a day (BID) | ORAL | Status: DC

## 2011-09-27 MED ORDER — TADALAFIL 20 MG PO TABS: 20.0000 mg | ORAL_TABLET | ORAL | Status: DC | PRN

## 2011-09-27 NOTE — Telephone Encounter (Signed)
Please refill x 1 Medicare wellness ov is due

## 2011-09-27 NOTE — Telephone Encounter (Signed)
Rx's printed awaiting MD's signature

## 2011-09-27 NOTE — Telephone Encounter (Signed)
Rx faxed to pharmacy, left message for pt to callback office to schedule OV.

## 2011-09-27 NOTE — Telephone Encounter (Signed)
Pt wants rx for Cialis and Nexium faxed to [phone]-please advise on Cialis refill

## 2011-09-28 ENCOUNTER — Telehealth: Payer: Self-pay

## 2011-09-28 MED ORDER — TADALAFIL 5 MG PO TABS: 5.0000 mg | ORAL_TABLET | Freq: Every day | ORAL | Status: DC

## 2011-09-28 NOTE — Telephone Encounter (Signed)
Wants rx faxed to [phone] printed-awaiting MD's signature

## 2011-09-28 NOTE — Telephone Encounter (Signed)
i changed med list, and i sent rx

## 2011-09-28 NOTE — Telephone Encounter (Signed)
Pt informed. Appointment scheduled next week.

## 2011-09-28 NOTE — Telephone Encounter (Signed)
Pt wants rx faxed to pharmacy that helps with medications-he will callback with fax number-he cannot afford at [facility]

## 2011-09-28 NOTE — Telephone Encounter (Signed)
Pt is requesting 5 mg cialis for everyday use instead of 20 mg as needed use. Pt is also requesting 3 month supply until he is able to sign up (in January) for Medical coverage through Medicare. Pt only has Rx and hospital at this time

## 2011-09-29 MED ORDER — TADALAFIL 5 MG PO TABS: 5.0000 mg | ORAL_TABLET | Freq: Every day | ORAL | Status: DC

## 2011-09-29 NOTE — Telephone Encounter (Signed)
Rx faxed to pharmacy, pt informed.  

## 2011-10-06 ENCOUNTER — Ambulatory Visit: Payer: Medicare Other | Admitting: Endocrinology

## 2012-01-29 ENCOUNTER — Other Ambulatory Visit: Payer: Self-pay | Admitting: Endocrinology

## 2012-02-17 ENCOUNTER — Ambulatory Visit (INDEPENDENT_AMBULATORY_CARE_PROVIDER_SITE_OTHER): Payer: Medicare Other | Admitting: Endocrinology

## 2012-02-17 ENCOUNTER — Encounter: Payer: Self-pay | Admitting: Endocrinology

## 2012-02-17 VITALS — BP 126/72 | HR 74 | Temp 98.3°F | Ht 75.5 in | Wt 198.6 lb

## 2012-02-17 DIAGNOSIS — R197 Diarrhea, unspecified: Secondary | ICD-10-CM | POA: Diagnosis not present

## 2012-02-17 MED ORDER — METRONIDAZOLE 250 MG PO TABS: 250.0000 mg | ORAL_TABLET | Freq: Three times a day (TID) | ORAL | Status: AC

## 2012-02-17 MED ORDER — TAMSULOSIN HCL 0.4 MG PO CAPS: 0.4000 mg | ORAL_CAPSULE | Freq: Every day | ORAL | Status: DC

## 2012-02-17 NOTE — Progress Notes (Signed)
  Subjective:    Patient ID: Omar Beecham., male    DOB: [birth year], 66 y.o.   MRN: [id number]  HPI Pt states 3 weeks of watery-quality diarrhea, but no assoc abd cramps.  No recent abx.  No n/v.  Several friends have had similar sxs, but they did not last this [place].   Past Medical History  Diagnosis Date  . Depression   . Hyperlipidemia   . GERD (gastroesophageal reflux disease)   . History of colonic polyps   . Decreased white blood cell count   . Carpal tunnel syndrome   . Prostatism   . BPH (benign prostatic hypertrophy)   . ED (erectile dysfunction)     No past surgical history on file.  History   Social History  . Marital Status: Married    Spouse Name: N/A    Number of Children: N/A  . Years of Education: N/A   Occupational History  . Not on file.   Social History Main Topics  . Smoking status: Former Games developer  . Smokeless tobacco: Not on file  . Alcohol Use: Not on file  . Drug Use: Not on file  . Sexually Active: Not on file   Other Topics Concern  . Not on file   Social History Narrative  . No narrative on file    Current Outpatient Prescriptions on File Prior to Visit  Medication Sig Dispense Refill  . ALPRAZolam (XANAX) 0.25 MG tablet Take 0.25 mg by mouth daily as needed.        . clotrimazole-betamethasone (LOTRISONE) cream Apply 1 application topically 3 (three) times daily as needed.        Marland Kitchen esomeprazole (NEXIUM) 40 MG capsule Take 1 capsule (40 mg total) by mouth 2 (two) times daily.  180 capsule  0  . Multiple Vitamin (MULTIVITAMIN) tablet Take 1 tablet by mouth daily.        . simvastatin (ZOCOR) 80 MG tablet TAKE 1 TABLET BY MOUTH AT BEDTIME 1 tablet by mouth at bedtime  30 tablet  5  . vardenafil (LEVITRA) 20 MG tablet Take 20 mg by mouth as needed.          Allergies  Allergen Reactions  . Sulfonamide Derivatives     REACTION: blisters on skin    Family History  Problem Relation Age of Onset  . Cancer Mother     Ovarian    BP  126/72  Pulse 74  Temp(Src) 98.3 F (36.8 C) (Oral)  Ht 6' 3.5" (1.918 m)  Wt 198 lb 9.6 oz (90.084 kg)  BMI 24.50 kg/m2  SpO2 97%   Review of Systems Denies brbpr and fever.      Objective:   Physical Exam VITAL SIGNS:  See vs page GENERAL: no distress ABDOMEN:  abdomen is soft, nontender.  no hepatosplenomegaly.   not distended.  no hernia.      Assessment & Plan:  Diarrhea, uncertain etiology.  new

## 2012-02-17 NOTE — Patient Instructions (Addendum)
i have sent a prescription to your pharmacy, for an antibiotic on a trial basis. I hope you feel better soon.  If you don't feel better by the middle of next week, please call back.

## 2012-02-24 ENCOUNTER — Telehealth: Payer: Self-pay | Admitting: *Deleted

## 2012-02-24 DIAGNOSIS — R197 Diarrhea, unspecified: Secondary | ICD-10-CM | POA: Insufficient documentation

## 2012-02-24 NOTE — Telephone Encounter (Signed)
Pt is requesting a referral to GI specialist for loose stool-pt states it has iimproved but he is still having loose stool

## 2012-02-24 NOTE — Telephone Encounter (Signed)
Done

## 2012-02-27 ENCOUNTER — Encounter: Payer: Self-pay | Admitting: Gastroenterology

## 2012-02-27 NOTE — Telephone Encounter (Signed)
Pt informed of referral

## 2012-02-29 ENCOUNTER — Ambulatory Visit (INDEPENDENT_AMBULATORY_CARE_PROVIDER_SITE_OTHER): Payer: Medicare Other | Admitting: Endocrinology

## 2012-02-29 ENCOUNTER — Encounter: Payer: Self-pay | Admitting: Endocrinology

## 2012-02-29 VITALS — BP 132/88 | HR 71 | Temp 97.0°F | Ht 75.5 in | Wt 207.8 lb

## 2012-02-29 DIAGNOSIS — Z0000 Encounter for general adult medical examination without abnormal findings: Secondary | ICD-10-CM | POA: Diagnosis not present

## 2012-02-29 DIAGNOSIS — R972 Elevated prostate specific antigen [PSA]: Secondary | ICD-10-CM | POA: Diagnosis not present

## 2012-02-29 DIAGNOSIS — E785 Hyperlipidemia, unspecified: Secondary | ICD-10-CM

## 2012-02-29 DIAGNOSIS — Z79899 Other long term (current) drug therapy: Secondary | ICD-10-CM | POA: Diagnosis not present

## 2012-02-29 DIAGNOSIS — D72819 Decreased white blood cell count, unspecified: Secondary | ICD-10-CM | POA: Diagnosis not present

## 2012-02-29 DIAGNOSIS — Z23 Encounter for immunization: Secondary | ICD-10-CM | POA: Diagnosis not present

## 2012-02-29 NOTE — Patient Instructions (Signed)
please consider these measures for your health:  minimize alcohol.  do not use tobacco products.  have a colonoscopy at least every 10 years from age 66.  keep firearms safely stored.  always use seat belts.  have working smoke alarms in your home.  see an eye doctor and dentist regularly.  never drive under the influence of alcohol or drugs (including prescription drugs).   please let me know what your wishes would be, if artificial life support measures should become necessary.  it is critically important to prevent falling down (keep floor areas well-lit, dry, and free of loose objects.  If you have a cane, walker, or wheelchair, you should use it, even for short trips around the house.  Also, try not to rush) blood tests are being requested for you today.  Please do them after April 24.  You will receive a letter with results.

## 2012-02-29 NOTE — Progress Notes (Signed)
Subjective:    Patient ID: Omar [name]., male    DOB: December 25, 1945, 66 y.o.   MRN: [id number]  HPI Pt returns for f/u of dyslipidemia.  He tolerates zocor well.  Denies myalgias. Past Medical History  Diagnosis Date  . Depression   . Hyperlipidemia   . GERD (gastroesophageal reflux disease)   . History of colonic polyps   . Decreased white blood cell count   . Carpal tunnel syndrome   . Prostatism   . BPH (benign prostatic hypertrophy)   . ED (erectile dysfunction)     No past surgical history on file.  History   Social History  . Marital Status: Married    Spouse Name: N/A    Number of Children: N/A  . Years of Education: N/A   Occupational History  . Not on file.   Social History Main Topics  . Smoking status: Former Games developer  . Smokeless tobacco: Not on file  . Alcohol Use: Not on file  . Drug Use: Not on file  . Sexually Active: Not on file   Other Topics Concern  . Not on file   Social History Narrative  . No narrative on file    Current Outpatient Prescriptions on File Prior to Visit  Medication Sig Dispense Refill  . esomeprazole (NEXIUM) 40 MG capsule Take 1 capsule (40 mg total) by mouth 2 (two) times daily.  180 capsule  0  . Multiple Vitamin (MULTIVITAMIN) tablet Take 1 tablet by mouth daily.        . simvastatin (ZOCOR) 80 MG tablet TAKE 1 TABLET BY MOUTH AT BEDTIME 1 tablet by mouth at bedtime  30 tablet  5  . Tamsulosin HCl (FLOMAX) 0.4 MG CAPS Take 1 capsule (0.4 mg total) by mouth daily.  30 capsule  11  . DISCONTD: vardenafil (LEVITRA) 20 MG tablet Take 20 mg by mouth as needed.          Allergies  Allergen Reactions  . Sulfonamide Derivatives     REACTION: blisters on skin    Family History  Problem Relation Age of Onset  . Cancer Mother     Ovarian    BP 132/88  Pulse 71  Temp(Src) 97 F (36.1 C) (Oral)  Ht 6' 3.5" (1.918 m)  Wt 207 lb 12.8 oz (94.257 kg)  BMI 25.63 kg/m2  SpO2 97%  Review of Systems Denies chest pain   Objective:   Physical Exam VITAL SIGNS:  See vs page GENERAL: no distress NECK: There is no palpable thyroid enlargement.  No thyroid nodule is palpable.  No palpable lymphadenopathy at the anterior neck. Pulses: no carotid bruit LUNGS:  Clear to auscultation HEART:  Regular rate and rhythm without murmurs noted. Normal S1,S2.    Lab Results  Component Value Date   WBC 6.3 06/02/2011   HGB 14.9 06/02/2011   HCT 43.4 06/02/2011   PLT 196.0 06/02/2011   GLUCOSE 90 06/02/2011   CHOL 152 03/29/2011   TRIG 82.0 03/29/2011   HDL 42.00 03/29/2011   LDLCALC 94 03/29/2011   ALT 21 06/02/2011   AST 16 06/02/2011   NA 138 06/02/2011   K 3.7 06/02/2011   CL 101 06/02/2011   CREATININE 1.2 06/02/2011   BUN 11 06/02/2011   CO2 29 06/02/2011   TSH 0.74 03/29/2011   PSA 4.95* 03/29/2011      Assessment & Plan:  Dyslipidemia.  He tolerates med well.   Subjective:   Patient here for Medicare annual  wellness visit and management of other chronic and acute problems.     Risk factors: advanced age    Roster of Physicians Providing Medical Care to Patient: none   Activities of Daily Living: In your present state of health, do you have any difficulty performing the following activities?:  Preparing food and eating?: No  Bathing yourself: No  Getting dressed: No  Using the toilet:No  Moving around from place to place: No  In the past year have you fallen or had a near fall?: No    Home Safety: Has smoke detector and wears seat belts.  Firearms are safely stored  Diet and Exercise  Current exercise habits:  Pt says good Dietary issues discussed:  Pt reports a healthy diet   Depression Screen  Q1: Over the past two weeks, have you felt down, depressed or hopeless? no  Q2: Over the past two weeks, have you felt little interest or pleasure in doing things? no   The following portions of the patient's history were reviewed and updated as appropriate: allergies, current medications, past family  history, past medical history, past social history, past surgical history and problem list.  Past Medical History  Diagnosis Date  . Depression   . Hyperlipidemia   . GERD (gastroesophageal reflux disease)   . History of colonic polyps   . Decreased white blood cell count   . Carpal tunnel syndrome   . Prostatism   . BPH (benign prostatic hypertrophy)   . ED (erectile dysfunction)     No past surgical history on file.  History   Social History  . Marital Status: Married    Spouse Name: N/A    Number of Children: N/A  . Years of Education: N/A   Occupational History  . Not on file.   Social History Main Topics  . Smoking status: Former Games developer  . Smokeless tobacco: Not on file  . Alcohol Use: Not on file  . Drug Use: Not on file  . Sexually Active: Not on file   Other Topics Concern  . Not on file   Social History Narrative  . No narrative on file    Current Outpatient Prescriptions on File Prior to Visit  Medication Sig Dispense Refill  . esomeprazole (NEXIUM) 40 MG capsule Take 1 capsule (40 mg total) by mouth 2 (two) times daily.  180 capsule  0  . Multiple Vitamin (MULTIVITAMIN) tablet Take 1 tablet by mouth daily.        . simvastatin (ZOCOR) 80 MG tablet TAKE 1 TABLET BY MOUTH AT BEDTIME 1 tablet by mouth at bedtime  30 tablet  5  . Tamsulosin HCl (FLOMAX) 0.4 MG CAPS Take 1 capsule (0.4 mg total) by mouth daily.  30 capsule  11    Allergies  Allergen Reactions  . Sulfonamide Derivatives     REACTION: blisters on skin    Family History  Problem Relation Age of Onset  . Cancer Mother     Ovarian    BP 132/88  Pulse 71  Temp(Src) 97 F (36.1 C) (Oral)  Ht 6' 3.5" (1.918 m)  Wt 207 lb 12.8 oz (94.257 kg)  BMI 25.63 kg/m2  SpO2 97%   Review of Systems  Denies hearing loss, and visual loss Objective:   Vision:  Sees opthalmologist Hearing: grossly normal Body mass index:  See vs page Msk: pt easily and quickly performs "get-up-and-go" from a  sitting position Cognitive Impairment Assessment: cognition, memory and judgment appear normal.  remembers 3/3 at 5 minutes.  excellent recall.  can easily read and write a sentence.  alert and oriented x 3.     Assessment:   Medicare wellness utd on preventive parameters    Plan:   During the course of the visit the patient was educated and counseled about appropriate screening and preventive services including:        Fall prevention   Diabetes screening  Nutrition counseling   Vaccines / LABS Zostavax / Pnemonccoal Vaccine  today  PSA  Patient Instructions (the written plan) was given to the patient.

## 2012-04-02 ENCOUNTER — Ambulatory Visit (INDEPENDENT_AMBULATORY_CARE_PROVIDER_SITE_OTHER): Payer: Medicare Other | Admitting: Gastroenterology

## 2012-04-02 ENCOUNTER — Encounter: Payer: Self-pay | Admitting: Gastroenterology

## 2012-04-02 ENCOUNTER — Other Ambulatory Visit (INDEPENDENT_AMBULATORY_CARE_PROVIDER_SITE_OTHER): Payer: Medicare Other

## 2012-04-02 ENCOUNTER — Other Ambulatory Visit: Payer: Self-pay | Admitting: Endocrinology

## 2012-04-02 ENCOUNTER — Encounter: Payer: Self-pay | Admitting: Endocrinology

## 2012-04-02 VITALS — BP 122/84 | HR 80 | Ht 75.5 in | Wt 200.5 lb

## 2012-04-02 DIAGNOSIS — E785 Hyperlipidemia, unspecified: Secondary | ICD-10-CM | POA: Diagnosis not present

## 2012-04-02 DIAGNOSIS — Z79899 Other long term (current) drug therapy: Secondary | ICD-10-CM

## 2012-04-02 DIAGNOSIS — K219 Gastro-esophageal reflux disease without esophagitis: Secondary | ICD-10-CM | POA: Diagnosis not present

## 2012-04-02 DIAGNOSIS — R972 Elevated prostate specific antigen [PSA]: Secondary | ICD-10-CM

## 2012-04-02 DIAGNOSIS — D72819 Decreased white blood cell count, unspecified: Secondary | ICD-10-CM | POA: Diagnosis not present

## 2012-04-02 LAB — BASIC METABOLIC PANEL
CO2: 31 mEq/L (ref 19–32)
Calcium: 9.1 mg/dL (ref 8.4–10.5)
Creatinine, Ser: 1.1 mg/dL (ref 0.4–1.5)
GFR: 90.83 mL/min (ref >60.00)
Glucose, Bld: 92 mg/dL (ref 70–99)
Sodium: 142 mEq/L (ref 135–145)

## 2012-04-02 LAB — CBC WITH DIFFERENTIAL/PLATELET
Basophils Relative: 1.1 % (ref 0.0–3.0)
Eosinophils Relative: 5.3 % — ABNORMAL HIGH (ref 0.0–5.0)
Lymphocytes Relative: 37.6 % (ref 12.0–46.0)
Monocytes Relative: 10.5 % (ref 3.0–12.0)
Neutrophils Relative %: 45.5 % (ref 43.0–77.0)
Platelets: 164 10*3/uL (ref 150.0–400.0)
RBC: 5.11 Mil/uL (ref 4.22–5.81)
WBC: 2.4 10*3/uL — ABNORMAL LOW (ref 4.5–10.5)

## 2012-04-02 LAB — LIPID PANEL
Cholesterol: 144 mg/dL (ref 0–200)
HDL: 43.6 mg/dL (ref >39.00)
LDL Cholesterol: 84 mg/dL (ref 0–99)
Total CHOL/HDL Ratio: 3
Triglycerides: 81 mg/dL (ref 0.0–149.0)
VLDL: 16.2 mg/dL (ref 0.0–40.0)

## 2012-04-02 LAB — HEPATIC FUNCTION PANEL
ALT: 22 U/L (ref 0–53)
AST: 16 U/L (ref 0–37)
Albumin: 3.9 g/dL (ref 3.5–5.2)
Total Bilirubin: 0.7 mg/dL (ref 0.3–1.2)

## 2012-04-02 NOTE — Progress Notes (Signed)
History of Present Illness: [name] is a pleasant 66 year old American male referred at the request of Dr. Everardo All for evaluation of reflux. He complains of frequent bitter taste in his mouth with a burning sensation. This tends to occur depending upon diet. Most recently he's been more symptomatic despite taking Nexium. In the last week symptoms have subsided. He denies dysphagia or odynophagia. Endoscopy in 2008 demonstrated a distal esophageal stricture which was dilated. Fundic gland polyps were seen as well. Colonoscopy in 2009 showed diverticulosis.     Past Medical History  Diagnosis Date  . Depression   . Hyperlipidemia   . GERD (gastroesophageal reflux disease)   . History of colonic polyps   . Decreased white blood cell count   . Carpal tunnel syndrome   . Prostatism   . BPH (benign prostatic hypertrophy)   . ED (erectile dysfunction)   . History of kidney stones   . History of rheumatic fever    Past Surgical History  Procedure Date  . Inguinal hernia repair   . Knee arthroscopy     left  . Tonsillectomy    family history includes Lung cancer in his maternal uncle and Ovarian cancer in his mother. Current Outpatient Prescriptions  Medication Sig Dispense Refill  . esomeprazole (NEXIUM) 40 MG capsule Take 1 capsule (40 mg total) by mouth 2 (two) times daily.  180 capsule  0  . simvastatin (ZOCOR) 80 MG tablet TAKE 1 TABLET BY MOUTH AT BEDTIME 1 tablet by mouth at bedtime  30 tablet  5  . Tamsulosin HCl (FLOMAX) 0.4 MG CAPS Take 1 capsule (0.4 mg total) by mouth daily.  30 capsule  11  . DISCONTD: vardenafil (LEVITRA) 20 MG tablet Take 20 mg by mouth as needed.         Allergies as of 04/02/2012 - Review Complete 04/02/2012  Allergen Reaction Noted  . Sulfonamide derivatives  06/11/2008    reports that he has quit smoking. His smoking use included Cigarettes. He has never used smokeless tobacco. He reports that he does not drink alcohol or use illicit  drugs.     Review of Systems: Pertinent positive and negative review of systems were noted in the above HPI section. All other review of systems were otherwise negative.  Vital signs were reviewed in today's medical record Physical Exam: General: Well developed , well nourished, no acute distress Head: Normocephalic and atraumatic Eyes:  sclerae anicteric, EOMI Ears: Normal auditory acuity Mouth: No deformity or lesions Neck: Supple, no masses or thyromegaly Lungs: Clear throughout to auscultation Heart: Regular rate and rhythm; no murmurs, rubs or bruits Abdomen: Soft, non tender and non distended. No masses, hepatosplenomegaly or hernias noted. Normal Bowel sounds Rectal:deferred Musculoskeletal: Symmetrical with no gross deformities  Skin: No lesions on visible extremities Pulses:  Normal pulses noted Extremities: No clubbing, cyanosis, edema or deformities noted Neurological: Alert oriented x 4, grossly nonfocal Cervical Nodes:  No significant cervical adenopathy Inguinal Nodes: No significant inguinal adenopathy Psychological:  Alert and cooperative. Normal mood and affect

## 2012-04-02 NOTE — Assessment & Plan Note (Addendum)
Symptoms are generally well controlled and seen to be diet-related. I have given him samples of dexilant and instructed him   to modify his diet during times when he is especially symptomatic.

## 2012-04-02 NOTE — Patient Instructions (Signed)
Diet for GERD or PUD Nutrition therapy can help ease the discomfort of gastroesophageal reflux disease (GERD) and peptic ulcer disease (PUD).  HOME CARE INSTRUCTIONS   Eat your meals slowly, in a relaxed setting.   Eat 5 to 6 small meals per day.   If a food causes distress, stop eating it for a period of time.  FOODS TO AVOID  Coffee, regular or decaffeinated.   Cola beverages, regular or low calorie.   Tea, regular or decaffeinated.   Pepper.   Cocoa.   High fat foods, including meats.   Butter, margarine, hydrogenated oil (trans fats).   Peppermint or spearmint (if you have GERD).   Fruits and vegetables if not tolerated.   Alcohol.   Nicotine (smoking or chewing). This is one of the most potent stimulants to acid production in the gastrointestinal tract.   Any food that seems to aggravate your condition.  If you have questions regarding your diet, ask your caregiver or a registered dietitian. TIPS  Lying flat may make symptoms worse. Keep the head of your bed raised 6 to 9 inches (15 to 23 cm) by using a foam wedge or blocks under the legs of the bed.   Do not lay down until 3 hours after eating a meal.   Daily physical activity may help reduce symptoms.  MAKE SURE YOU:   Understand these instructions.   Will watch your condition.   Will get help right away if you are not doing well or get worse.  Document Released: 11/21/2005 Document Revised: 11/10/2011 Document Reviewed: 10/07/2011 [facility] Patient Information 2012 [place], [place].  Gastroesophageal Reflux Disease, Adult Gastroesophageal reflux disease (GERD) happens when acid from your stomach flows up into the esophagus. When acid comes in contact with the esophagus, the acid causes soreness (inflammation) in the esophagus. Over time, GERD may create small holes (ulcers) in the lining of the esophagus. CAUSES   Increased body weight. This puts pressure on the stomach, making acid rise from the stomach  into the esophagus.   Smoking. This increases acid production in the stomach.   Drinking alcohol. This causes decreased pressure in the lower esophageal sphincter (valve or ring of muscle between the esophagus and stomach), allowing acid from the stomach into the esophagus.   Late evening meals and a full stomach. This increases pressure and acid production in the stomach.   A malformed lower esophageal sphincter.  Sometimes, no cause is found. SYMPTOMS   Burning pain in the lower part of the mid-chest behind the breastbone and in the mid-stomach area. This may occur twice a week or more often.   Trouble swallowing.   Sore throat.   Dry cough.   Asthma-like symptoms including chest tightness, shortness of breath, or wheezing.  DIAGNOSIS  Your caregiver may be able to diagnose GERD based on your symptoms. In some cases, X-rays and other tests may be done to check for complications or to check the condition of your stomach and esophagus. TREATMENT  Your caregiver may recommend over-the-counter or prescription medicines to help decrease acid production. Ask your caregiver before starting or adding any new medicines.  HOME CARE INSTRUCTIONS   Change the factors that you can control. Ask your caregiver for guidance concerning weight loss, quitting smoking, and alcohol consumption.   Avoid foods and drinks that make your symptoms worse, such as:   Caffeine or alcoholic drinks.   Chocolate.   Peppermint or mint flavorings.   Garlic and onions.   Spicy foods.  Citrus fruits, such as oranges, lemons, or limes.   Tomato-based foods such as sauce, chili, salsa, and pizza.   Fried and fatty foods.   Avoid lying down for the 3 hours prior to your bedtime or prior to taking a nap.   Eat small, frequent meals instead of large meals.   Wear loose-fitting clothing. Do not wear anything tight around your waist that causes pressure on your stomach.   Raise the head of your bed 6 to  8 inches with wood blocks to help you sleep. Extra pillows will not help.   Only take over-the-counter or prescription medicines for pain, discomfort, or fever as directed by your caregiver.   Do not take aspirin, ibuprofen, or other nonsteroidal anti-inflammatory drugs (NSAIDs).  SEEK IMMEDIATE MEDICAL CARE IF:   You have pain in your arms, neck, jaw, teeth, or back.   Your pain increases or changes in intensity or duration.   You develop nausea, vomiting, or sweating (diaphoresis).   You develop shortness of breath, or you faint.   Your vomit is green, yellow, black, or looks like coffee grounds or blood.   Your stool is red, bloody, or black.  These symptoms could be signs of other problems, such as heart disease, gastric bleeding, or esophageal bleeding. MAKE SURE YOU:   Understand these instructions.   Will watch your condition.   Will get help right away if you are not doing well or get worse.  Document Released: 08/31/2005 Document Revised: 11/10/2011 Document Reviewed: 06/10/2011 [facility] Patient Information 2012 [place], [place]. We have given you Dexilant samples today

## 2012-04-03 ENCOUNTER — Telehealth: Payer: Self-pay | Admitting: *Deleted

## 2012-04-03 NOTE — Telephone Encounter (Signed)
Called pt to inform of lab results, left message for pt to callback office (letter also mailed to pt). 

## 2012-04-03 NOTE — Telephone Encounter (Signed)
Pt advised of lab results.  

## 2012-04-05 ENCOUNTER — Telehealth: Payer: Self-pay | Admitting: Gastroenterology

## 2012-04-05 NOTE — Telephone Encounter (Signed)
Pt was given dexilant to take but states he has been experiencing some nausea. Per last OV note pt is to take the dexilant and adjust diet when he has problems. Discussed with pt and he states he has not started taking the dexilant yet. Encouraged pt to take the dexilant and to call us back if he had no improvement. Pt verbalized understanding.

## 2012-04-23 ENCOUNTER — Telehealth: Payer: Self-pay | Admitting: Gastroenterology

## 2012-04-23 NOTE — Telephone Encounter (Signed)
Left message for pt to call back  °

## 2012-04-23 NOTE — Telephone Encounter (Signed)
Pt scheduled to see Amy Esterwood PA 04/26/12@10am . Pt c/o nausea and weight loss. Pt aware of appt date and time.

## 2012-04-26 ENCOUNTER — Ambulatory Visit (INDEPENDENT_AMBULATORY_CARE_PROVIDER_SITE_OTHER): Payer: Medicare Other | Admitting: Physician Assistant

## 2012-04-26 ENCOUNTER — Encounter: Payer: Self-pay | Admitting: *Deleted

## 2012-04-26 VITALS — BP 120/64 | HR 88 | Ht 75.0 in | Wt 201.0 lb

## 2012-04-26 DIAGNOSIS — R110 Nausea: Secondary | ICD-10-CM

## 2012-04-26 DIAGNOSIS — K219 Gastro-esophageal reflux disease without esophagitis: Secondary | ICD-10-CM | POA: Diagnosis not present

## 2012-04-26 DIAGNOSIS — Z800 Family history of malignant neoplasm of digestive organs: Secondary | ICD-10-CM

## 2012-04-26 DIAGNOSIS — R1031 Right lower quadrant pain: Secondary | ICD-10-CM | POA: Diagnosis not present

## 2012-04-26 MED ORDER — MOVIPREP 100 G PO SOLR: ORAL | Status: DC

## 2012-04-26 NOTE — Patient Instructions (Signed)
We scheduled the Endoscopy/Colonoscopy with Dr.Kaplan on 05-03-2012. We faxed a prescription for the Moviprep to [facility].  Directions provided.  Upper GI Endoscopy Upper GI endoscopy means using a flexible scope to look at the esophagus, stomach, and upper small bowel. This is done to make a diagnosis in people with heartburn, abdominal pain, or abnormal bleeding. Sometimes an endoscope is needed to remove foreign bodies or food that become stuck in the esophagus; it can also be used to take biopsy samples. For the best results, do not eat or drink for 8 hours before having your upper endoscopy.  To perform the endoscopy, you will probably be sedated and your throat will be numbed with a special spray. The endoscope is then slowly passed down your throat (this will not interfere with your breathing). An endoscopy exam takes 15 to 30 minutes to complete and there is no real pain. Patients rarely remember much about the procedure. The results of the test may take several days if a biopsy or other test is taken.  You may have a sore throat after an endoscopy exam. Serious complications are very rare. Stick to liquids and soft foods until your pain is better. Do not drive a car or operate any dangerous equipment for at least 24 hours after being sedated. SEEK IMMEDIATE MEDICAL CARE IF:   You have severe throat pain.   You have shortness of breath.   You have bleeding problems.   You have a fever.   You have difficulty recovering from your sedation.  Document Released: 12/29/2004 Document Revised: 11/10/2011 Document Reviewed: 11/23/2008 [facility] Patient Information 2012 [place], [place].  Colonoscopy A colonoscopy is an exam to evaluate your entire colon. In this exam, your colon is cleansed. A Saltos fiberoptic tube is inserted through your rectum and into your colon. The fiberoptic scope (endoscope) is a Griess bundle of enclosed and very flexible fibers. These fibers transmit light to the  area examined and send images from that area to your caregiver. Discomfort is usually minimal. You may be given a drug to help you sleep (sedative) during or prior to the procedure. This exam helps to detect lumps (tumors), polyps, inflammation, and areas of bleeding. Your caregiver may also take a small piece of tissue (biopsy) that will be examined under a microscope. LET YOUR CAREGIVER KNOW ABOUT:   Allergies to food or medicine.   Medicines taken, including vitamins, herbs, eyedrops, over-the-counter medicines, and creams.   Use of steroids (by mouth or creams).   Previous problems with anesthetics or numbing medicines.   History of bleeding problems or blood clots.   Previous surgery.   Other health problems, including diabetes and kidney problems.   Possibility of pregnancy, if this applies.  BEFORE THE PROCEDURE   A clear liquid diet may be required for 2 days before the exam.   Ask your caregiver about changing or stopping your regular medications.   Liquid injections (enemas) or laxatives may be required.   A large amount of electrolyte solution may be given to you to drink over a short period of time. This solution is used to clean out your colon.   You should be present 60 minutes prior to your procedure or as directed by your caregiver.  AFTER THE PROCEDURE   If you received a sedative or pain relieving medication, you will need to arrange for someone to drive you home.   Occasionally, there is a little blood passed with the first bowel movement. Do not  be concerned.  FINDING OUT THE RESULTS OF YOUR TEST Not all test results are available during your visit. If your test results are not back during the visit, make an appointment with your caregiver to find out the results. Do not assume everything is normal if you have not heard from your caregiver or the medical facility. It is important for you to follow up on all of your test results. HOME CARE INSTRUCTIONS   It is  not unusual to pass moderate amounts of gas and experience mild abdominal cramping following the procedure. This is due to air being used to inflate your colon during the exam. Walking or a warm pack on your belly (abdomen) may help.   You may resume all normal meals and activities after sedatives and medicines have worn off.   Only take over-the-counter or prescription medicines for pain, discomfort, or fever as directed by your caregiver. Do not use aspirin or blood thinners if a biopsy was taken. Consult your caregiver for medicine usage if biopsies were taken.  SEEK IMMEDIATE MEDICAL CARE IF:   You have a fever.   You pass large blood clots or fill a toilet with blood following the procedure. This may also occur 10 to 14 days following the procedure. This is more likely if a biopsy was taken.   You develop abdominal pain that keeps getting worse and cannot be relieved with medicine.  Document Released: 11/18/2000 Document Revised: 11/10/2011 Document Reviewed: 07/03/2008 [facility] Patient Information 2012 [place], [place].

## 2012-04-26 NOTE — Progress Notes (Signed)
Subjective:    Patient ID: Omar Beecham., male    DOB: 1946-11-10, 66 y.o.   MRN: [id number]  HPI Omar [name] is a pleasant 66 year old [place] male known to Dr. Arlyce Dice he was last seen in the office 04/02/2012. At that time he was complaining of reflux type symptoms. He had been on Nexium and was switched to a trial of Dexilant. He says the Dexilant does not seem to be making any difference and he has been feeling worse over the past several weeks. He describes daily heartburn, indigestion and sour brash type symptoms over the past several months and is now having problems with almost daily nausea as well. At this point his nausea comes and goes but is present every day rate he has not had any vomiting his appetite has been fine and his weight overall has been stable. He says his bowel movements have been normal he has not noted any melena or hematochezia. He is concerned because over the past few weeks he has been having right lowe rquadrant pain which is new and is fairly constant. He says he is uncomfortable in the same area where a prior right inguinal herniorrhaphy was done. This pain seems to be somewhat worse with walking. He has not had a definite radiation into the groin. Patient has not had any other abdominal surgery other than hernia repair which was remote.  He also mentions that his mother had colon cancer and has a strong family history of cancers on his maternal side of the family.. Last colonoscopy was done in 2009 showing diverticulosis and last endoscopy done was done in 2008 he did have a distal esophageal stricture which was dilated.  Labs from 04/02/2012 were reviewed he had a white count of 2.4 looking back his white count generally has been mildly low. Hemoglobin 14.2 hematocrit 42.3 platelets 164 electrolytes within normal limits , TSH was 0.7, and PSA is elevated at 5.59. 1 year ago his PSA was 4.95.    Review of Systems  Constitutional: Negative.   HENT: Negative.   Eyes:  Negative.   Respiratory: Negative.   Cardiovascular: Negative.   Gastrointestinal: Positive for nausea and abdominal pain.  Genitourinary: Negative.   Neurological: Negative.   Hematological: Negative.   Psychiatric/Behavioral: Negative.    Outpatient Prescriptions Prior to Visit  Medication Sig Dispense Refill  . esomeprazole (NEXIUM) 40 MG capsule Take 1 capsule (40 mg total) by mouth 2 (two) times daily.  180 capsule  0  . simvastatin (ZOCOR) 80 MG tablet TAKE 1 TABLET BY MOUTH AT BEDTIME 1 tablet by mouth at bedtime  30 tablet  5  . Tamsulosin HCl (FLOMAX) 0.4 MG CAPS Take 1 capsule (0.4 mg total) by mouth daily.  30 capsule  11    Allergies  Allergen Reactions  . Sulfonamide Derivatives     REACTION: blisters on skin       Patient Active Problem List  Diagnoses  . HYPERLIPIDEMIA  . LEUKOPENIA, MILD  . DEPRESSION  . BENIGN POSITIONAL VERTIGO  . ESOPHAGEAL REFLUX  . MELENA  . PAINFUL RESPIRATION  . COLONIC POLYPS, HX OF  . UNSPECIFIED DISORDER OF ANKLE AND FOOT JOINT  . Abdominal pain, unspecified site  . BPH (benign prostatic hyperplasia)  . Elevated PSA  . Diarrhea  . Encounter for Stead-term (current) use of other medications   History   Social History  . Marital Status: Married    Spouse Name: N/A    Number of Children: 4  .  Years of Education: N/A   Occupational History  . pastor    Social History Main Topics  . Smoking status: Former Smoker    Types: Cigarettes  . Smokeless tobacco: Never Used  . Alcohol Use: No  . Drug Use: No  . Sexually Active: Not on file   Other Topics Concern  . Not on file   Social History Narrative  . No narrative on file    Objective:   Physical Exam well-developed African American male in no acute distress, accompanied by his wife. HEENT; nontraumatic normocephalic EOMI PERRLA sclera anicteric,Neck; Supple no JVD, Cardiovascular; regular rate and rhythm with S1-S2 no murmur gallop, Pulmonary; clear bilaterally,  Abdomen; soft is mildly tender with deep palpation in the right lower quadrant and over his prior right lower quadrant hernia scar is no palpable defect or hernia currently a palpable mass or hepatosplenomegaly bowel sounds are present, Rectal; exam not done today, Extremities; no clubbing cyanosis or edema and warm and dry, psych ;mood and affect normal and appropriate        Assessment & Plan:  #87 66 year old male with persistent GERD and sour brash type symptoms refractory to recent trial of Dexilant. Patient all also now with development of nausea intermittent over the past several weeks and new right lower quadrant pain. Etiology of his symptoms is not clear. He may have a recurrent early right inguinal hernia, however I cannot demonstrate this today. Need to rule out occult colon lesion him a peptic ulcer disease gastritis and gallbladder disease are all possibilities to explain his indigestion and nausea.  Plan; discussed with patient and his wife. He would like to proceed with colonoscopy and upper endoscopy first, and these will be scheduled with Dr. Arlyce Dice. Procedures were discussed in detail. Trial of Zegerid 40 mg once daily, samples were given. This will be in place of Dexilant If colonoscopy and upper endoscopy are nondiagnostic he will need CT scan of the abdomen and pelvis.

## 2012-04-27 NOTE — Progress Notes (Signed)
Agree with assessment and plans 

## 2012-05-03 ENCOUNTER — Other Ambulatory Visit: Payer: Self-pay | Admitting: Gastroenterology

## 2012-05-03 ENCOUNTER — Ambulatory Visit (AMBULATORY_SURGERY_CENTER): Payer: Medicare Other | Admitting: Gastroenterology

## 2012-05-03 ENCOUNTER — Encounter: Payer: Self-pay | Admitting: Gastroenterology

## 2012-05-03 ENCOUNTER — Encounter: Payer: Medicare Other | Admitting: Gastroenterology

## 2012-05-03 VITALS — BP 163/108 | HR 86 | Temp 99.2°F | Resp 18 | Ht 75.0 in | Wt 201.0 lb

## 2012-05-03 DIAGNOSIS — R1031 Right lower quadrant pain: Secondary | ICD-10-CM

## 2012-05-03 DIAGNOSIS — D131 Benign neoplasm of stomach: Secondary | ICD-10-CM

## 2012-05-03 DIAGNOSIS — Z800 Family history of malignant neoplasm of digestive organs: Secondary | ICD-10-CM | POA: Diagnosis not present

## 2012-05-03 DIAGNOSIS — K222 Esophageal obstruction: Secondary | ICD-10-CM

## 2012-05-03 DIAGNOSIS — R109 Unspecified abdominal pain: Secondary | ICD-10-CM

## 2012-05-03 DIAGNOSIS — R110 Nausea: Secondary | ICD-10-CM

## 2012-05-03 DIAGNOSIS — R1310 Dysphagia, unspecified: Secondary | ICD-10-CM | POA: Diagnosis not present

## 2012-05-03 DIAGNOSIS — K219 Gastro-esophageal reflux disease without esophagitis: Secondary | ICD-10-CM

## 2012-05-03 DIAGNOSIS — K317 Polyp of stomach and duodenum: Secondary | ICD-10-CM

## 2012-05-03 MED ORDER — SODIUM CHLORIDE 0.9 % IV SOLN: 500.0000 mL | INTRAVENOUS | Status: DC

## 2012-05-03 NOTE — Op Note (Addendum)
[facility] [address] [place], [place]  [address]  COLONOSCOPY PROCEDURE REPORT  PATIENT:  Omar, [name]  MR#:  [id number] BIRTHDATE:  1946/11/24, 66 yrs. old  GENDER:  male ENDOSCOPIST:  Barbette Hair. Arlyce Dice, MD REF. BY: PROCEDURE DATE:  05/03/2012 PROCEDURE:  Diagnostic Colonoscopy ASA CLASS:  Class II INDICATIONS:  Screening MEDICATIONS:   MAC sedation, administered by CRNA propofol 100mg IV  DESCRIPTION OF PROCEDURE:   After the risks benefits and alternatives of the procedure were thoroughly explained, informed consent was obtained.  Digital rectal exam was performed and revealed no abnormalities.   The LB CF-[id number] endoscope was introduced through the anus and advanced to the cecum, which was identified by both the appendix and ileocecal valve, limited by poor preparation.  Moderate amount of retained liquid stool The quality of the prep was Moviprep fair.  The instrument was then slowly withdrawn as the colon was fully examined. <<PROCEDUREIMAGES>>  FINDINGS:  Scattered diverticula were found in the sigmoid to descending colon segments (see image2).  This was otherwise a normal examination of the colon (see image1, image3, and image4). Retroflexed views in the rectum revealed no abnormalities.    The time to cecum =  1) 4.25  minutes. The scope was then withdrawn in 1) 6.75  minutes from the cecum and the procedure completed. COMPLICATIONS:  None ENDOSCOPIC IMPRESSION: 1) Diverticula, scattered in the sigmoid to descending colon segments 2) Otherwise normal examination RECOMMENDATIONS: 1) colonoscopy 10 years REPEAT EXAM:  In 10 year(s) for Colonoscopy.  ______________________________ Barbette Hair. Arlyce Dice, MD  CC:  Minus Breeding, MD  n. REVISED:  05/30/2012 09:12 AM eSIGNED:   Barbette Hair. Merrianne Mccumbers at 05/30/2012 09:12 AM  Willette Alma, [phone]

## 2012-05-03 NOTE — Progress Notes (Signed)
Patient did not experience any of the following events: a burn prior to discharge; a fall within the facility; wrong site/side/patient/procedure/implant event; or a hospital transfer or hospital admission upon discharge from the facility. (G8907) Patient did not have preoperative order for IV antibiotic SSI prophylaxis. (G8918)  

## 2012-05-03 NOTE — Patient Instructions (Addendum)
YOU HAD AN ENDOSCOPIC PROCEDURE TODAY AT THE [facility]: Refer to the procedure report that was given to you for any specific questions about what was found during the examination.  If the procedure report does not answer your questions, please call your gastroenterologist to clarify.  If you requested that your care partner not be given the details of your procedure findings, then the procedure report has been included in a sealed envelope for you to review at your convenience later.  YOU SHOULD EXPECT: Some feelings of bloating in the abdomen. Passage of more gas than usual.  Walking can help get rid of the air that was put into your GI tract during the procedure and reduce the bloating. If you had a lower endoscopy (such as a colonoscopy or flexible sigmoidoscopy) you may notice spotting of blood in your stool or on the toilet paper. If you underwent a bowel prep for your procedure, then you may not have a normal bowel movement for a few days.  DIET- FOLLOW DILATION DIET, SEE HANDOUT.  Drink plenty of fluids but you should avoid alcoholic beverages for 24 hours.  ACTIVITY: Your care partner should take you home directly after the procedure.  You should plan to take it easy, moving slowly for the rest of the day.  You can resume normal activity the day after the procedure however you should NOT DRIVE or use heavy machinery for 24 hours (because of the sedation medicines used during the test).    SYMPTOMS TO REPORT IMMEDIATELY: A gastroenterologist can be reached at any hour.  During normal business hours, 8:30 AM to 5:00 PM Monday through Friday, call [phone].  After hours and on weekends, please call the GI answering service at [phone] who will take a message and have the physician on call contact you.   Following lower endoscopy (colonoscopy or flexible sigmoidoscopy):  Excessive amounts of blood in the stool  Significant tenderness or worsening of abdominal  pains  Swelling of the abdomen that is new, acute  Fever of 100F or higher  Following upper endoscopy (EGD)  Vomiting of blood or coffee ground material  New chest pain or pain under the shoulder blades  Painful or persistently difficult swallowing  New shortness of breath  Fever of 100F or higher  Black, tarry-looking stools  FOLLOW UP: If any biopsies were taken you will be contacted by phone or by letter within the next 1-3 weeks.  Call your gastroenterologist if you have not heard about the biopsies in 3 weeks.  Our staff will call the home number listed on your records the next business day following your procedure to check on you and address any questions or concerns that you may have at that time regarding the information given to you following your procedure. This is a courtesy call and so if there is no answer at the home number and we have not heard from you through the emergency physician on call, we will assume that you have returned to your regular daily activities without incident.  SIGNATURES/CONFIDENTIALITY: You and/or your care partner have signed paperwork which will be entered into your electronic medical record.  These signatures attest to the fact that that the information above on your After Visit Summary has been reviewed and is understood.  Full responsibility of the confidentiality of this discharge information lies with you and/or your care-partner.   DR. Marzetta Board OFFICE WILL CALL YOU TO SET UP A GASTRIC EMPTYING SCAN TO TEST YOUR  STOMACH FUNCTIONS  CALL HIS OFFICE IN THE NEXT 2-3 DAYS TO SCHEDULE A FOLLOW UP APPOINTMENT FOR 3 WEEKS

## 2012-05-03 NOTE — Op Note (Signed)
[facility] [address] [place], [place]  [address]  ENDOSCOPY PROCEDURE REPORT  PATIENT:  Omar [name], Omar [name]  MR#:  [id number] BIRTHDATE:  09/28/1946, 66 yrs. old  GENDER:  male  ENDOSCOPIST:  Barbette Hair. Arlyce Dice, MD Referred by:  PROCEDURE DATE:  05/03/2012 PROCEDURE:  EGD with biopsy, 43239, Maloney Dilation of Esophagus ASA CLASS:  Class II INDICATIONS:  abdominal pain, nausea  MEDICATIONS:   There was residual sedation effect present from prior procedure., MAC sedation, administered by CRNA propofol 100mg IV, glycopyrrolate (Robinal) 0.2 mg IV, 0.6cc simethancone 0.6 cc PO TOPICAL ANESTHETIC:  DESCRIPTION OF PROCEDURE:   After the risks and benefits of the procedure were explained, informed consent was obtained.  The [facility] GIF-H180 [id number] endoscope was introduced through the mouth and advanced to the third portion of the duodenum.  The instrument was slowly withdrawn as the mucosa was fully examined. <<PROCEDUREIMAGES>>  A stricture was found at the gastroesophageal junction (see image9 and image10). Dilation with maloney dilator 18mm Mild to moderate resistance; no heme  There were multiple polyps identified. Multiple fundic appearing sessile polyps ranging from 2-71mm in gastric fundus and body. There is a single pedunculated 12-45mm vascular polyp in antrum. Bxs taken (see image1 and image4).  Otherwise the examination was normal (see image3 and image6).    Retroflexed views revealed no abnormalities.    The scope was then withdrawn from the patient and the procedure completed.  COMPLICATIONS:  None  ENDOSCOPIC IMPRESSION: 1) Stricture at the gastroesophageal junction - s/p maloney dilitation 2) Gastric Polyps, multiple 3) Otherwise normal examination RECOMMENDATIONS: 1) Await biopsy results 2) continue PPI 3) My office will arrange for you to have a Gastric Emptying Scan performed. This is a radiology test that gives an idea of how well your stomach  functions. 4) Call office next 2-3 days to schedule an office appointment for 3 weeks  ______________________________ Barbette Hair. Arlyce Dice, MD  CC:  Minus Breeding, MD  n. Rosalie DoctorBarbette Hair. Lillia Lengel at 05/03/2012 03:54 PM  Willette Alma, [phone]

## 2012-05-03 NOTE — Progress Notes (Addendum)
Propofol per [facility] crna. See scanned crna intra procedure report. ewm  Pt tolerated both procedures well/ ewm

## 2012-05-04 ENCOUNTER — Telehealth: Payer: Self-pay | Admitting: *Deleted

## 2012-05-04 ENCOUNTER — Telehealth: Payer: Self-pay

## 2012-05-04 NOTE — Telephone Encounter (Signed)
  Follow up Call-  Call back number 05/03/2012  Post procedure Call Back phone  # [phone]  Permission to leave phone message Yes     Patient questions:  Do you have a fever, pain , or abdominal swelling? no Pain Score  0 *  Have you tolerated food without any problems? yes  Have you been able to return to your normal activities? yes  Do you have any questions about your discharge instructions: Diet   no Medications  no Follow up visit  no  Do you have questions or concerns about your Care? no  Actions: * If pain score is 4 or above: No action needed, pain <4.

## 2012-05-04 NOTE — Telephone Encounter (Signed)
Pt scheduled for GES @[facility]  05/10/12 arrival time 0745 for an 8am appt. Pt to be NPO after midnight and to hold Nexium. Pt states this date will not work for him. Pt given phone number [phone] to call and reschedule the appt. Pt aware.

## 2012-05-10 ENCOUNTER — Other Ambulatory Visit (HOSPITAL_COMMUNITY): Payer: Medicare Other

## 2012-05-10 ENCOUNTER — Encounter: Payer: Self-pay | Admitting: Gastroenterology

## 2012-05-30 ENCOUNTER — Encounter: Payer: Self-pay | Admitting: Gastroenterology

## 2012-05-30 ENCOUNTER — Ambulatory Visit (INDEPENDENT_AMBULATORY_CARE_PROVIDER_SITE_OTHER): Payer: Medicare Other | Admitting: Gastroenterology

## 2012-05-30 VITALS — BP 120/76 | HR 88 | Ht 75.0 in | Wt 202.0 lb

## 2012-05-30 DIAGNOSIS — K222 Esophageal obstruction: Secondary | ICD-10-CM | POA: Diagnosis not present

## 2012-05-30 DIAGNOSIS — K219 Gastro-esophageal reflux disease without esophagitis: Secondary | ICD-10-CM | POA: Diagnosis not present

## 2012-05-30 DIAGNOSIS — Z86010 Personal history of colonic polyps: Secondary | ICD-10-CM | POA: Diagnosis not present

## 2012-05-30 NOTE — Assessment & Plan Note (Signed)
Well-controlled with Nexium 

## 2012-05-30 NOTE — Assessment & Plan Note (Signed)
Plan repeat colonoscopy 10 years

## 2012-05-30 NOTE — Patient Instructions (Addendum)
Follow up as needed

## 2012-05-30 NOTE — Progress Notes (Signed)
History of Present Illness:  Omar [name] returns following colonoscopy and upper endoscopy. The latter was normal. The former demonstrated a distal esophageal stricture which was dilated. He's currently taking Nexium andhas no  GI complaints including pyrosis, regurgitation of gastric contents or dysphagia.    Review of Systems: Pertinent positive and negative review of systems were noted in the above HPI section. All other review of systems were otherwise negative.    Current Medications, Allergies, Past Medical History, Past Surgical History, Family History and Social History were reviewed in [employer] electronic medical record  Vital signs were reviewed in today's medical record. Physical Exam: General: Well developed , well nourished, no acute distress

## 2012-05-31 DIAGNOSIS — K222 Esophageal obstruction: Secondary | ICD-10-CM | POA: Insufficient documentation

## 2012-05-31 NOTE — Assessment & Plan Note (Signed)
Repeat dilitation prn

## 2012-06-18 ENCOUNTER — Other Ambulatory Visit: Payer: Self-pay | Admitting: Endocrinology

## 2012-06-19 ENCOUNTER — Ambulatory Visit (INDEPENDENT_AMBULATORY_CARE_PROVIDER_SITE_OTHER): Payer: Medicare Other | Admitting: Internal Medicine

## 2012-06-19 ENCOUNTER — Encounter: Payer: Self-pay | Admitting: Internal Medicine

## 2012-06-19 VITALS — BP 110/70 | HR 86 | Temp 98.4°F | Ht 75.5 in | Wt 201.4 lb

## 2012-06-19 DIAGNOSIS — K219 Gastro-esophageal reflux disease without esophagitis: Secondary | ICD-10-CM | POA: Diagnosis not present

## 2012-06-19 DIAGNOSIS — R109 Unspecified abdominal pain: Secondary | ICD-10-CM | POA: Diagnosis not present

## 2012-06-19 DIAGNOSIS — R972 Elevated prostate specific antigen [PSA]: Secondary | ICD-10-CM

## 2012-06-19 DIAGNOSIS — R1030 Lower abdominal pain, unspecified: Secondary | ICD-10-CM

## 2012-06-19 MED ORDER — CYCLOBENZAPRINE HCL 5 MG PO TABS: 5.0000 mg | ORAL_TABLET | Freq: Three times a day (TID) | ORAL | Status: AC | PRN

## 2012-06-19 MED ORDER — ESOMEPRAZOLE MAGNESIUM 40 MG PO CPDR: 40.0000 mg | DELAYED_RELEASE_CAPSULE | Freq: Every day | ORAL | Status: DC

## 2012-06-19 NOTE — Assessment & Plan Note (Signed)
Overall stable overall by hx and exam, and pt to continue medical treatment as before- nexium refilled

## 2012-06-19 NOTE — Progress Notes (Signed)
Subjective:    Patient ID: Omar [name]., male    DOB: 11-05-1946, 66 y.o.   MRN: [id number]  HPI  Here with intermittent sharp right groin pain without swelling or mass, mild but keeps recurring, nothing makes better or worse, started after had to wrestle with a lawnmower trying to fall off a counter he was working on.  Denies worsening reflux except recently in the past 2 wks since he ran out of nexium, but no dysphagia, other abd pain, n/v, bowel change or blood.   Denies urinary symptoms such as dysuria, frequency, urgency,or hematuria.  Not worse with walking, no back or hip joint pain.  Denies worsening prostatism recent, has had elev PSA April 2013, referred per Dr Everardo All to urology, but for some reason this was not accomplished.   Pt denies fever, wt loss, night sweats, loss of appetite, or other constitutional symptoms Past Medical History  Diagnosis Date  . Depression   . Hyperlipidemia   . GERD (gastroesophageal reflux disease)   . History of colonic polyps   . Decreased white blood cell count   . Carpal tunnel syndrome   . Prostatism   . BPH (benign prostatic hypertrophy)   . ED (erectile dysfunction)   . History of kidney stones   . History of rheumatic fever   . History of esophageal stricture 10/31/2007  . Diverticulosis   . Gastric polyps    Past Surgical History  Procedure Date  . Inguinal hernia repair   . Knee arthroscopy     left  . Tonsillectomy     reports that he has quit smoking. His smoking use included Cigarettes. He has never used smokeless tobacco. He reports that he does not drink alcohol or use illicit drugs. family history includes Lung cancer in his maternal aunt and maternal uncle and Ovarian cancer in his mother.  There is no history of Colon cancer, and Esophageal cancer, and Stomach cancer, and Rectal cancer, . Allergies  Allergen Reactions  . Sulfonamide Derivatives     REACTION: blisters on skin   Current Outpatient Prescriptions on File  Prior to Visit  Medication Sig Dispense Refill  . Alum Hydroxide-Mag Carbonate (GAVISCON PO) Take by mouth as directed.      Marland Kitchen esomeprazole (NEXIUM) 40 MG capsule Take 1 capsule (40 mg total) by mouth 2 (two) times daily.  180 capsule  0  . simvastatin (ZOCOR) 80 MG tablet TAKE 1 TABLET BY MOUTH AT BEDTIME 1 tablet by mouth at bedtime  30 tablet  5  . Tamsulosin HCl (FLOMAX) 0.4 MG CAPS Take 1 capsule (0.4 mg total) by mouth daily.  30 capsule  11  . DISCONTD: vardenafil (LEVITRA) 20 MG tablet Take 20 mg by mouth as needed.         Review of Systems  Constitutional: Negative for diaphoresis and unexpected weight change.  HENT: Negative for tinnitus.   Eyes: Negative for photophobia and visual disturbance.  Respiratory: Negative for choking and stridor.   Gastrointestinal: Negative for vomiting and blood in stool.  Genitourinary: Negative for hematuria and decreased urine volume.  Musculoskeletal: Negative for gait problem.  Skin: Negative for color change and wound.  Neurological: Negative for tremors and numbness.    Objective:   Physical Exam BP 110/70  Pulse 86  Temp 98.4 F (36.9 C) (Oral)  Ht 6' 3.5" (1.918 m)  Wt 201 lb 6 oz (91.343 kg)  BMI 24.84 kg/m2  SpO2 97% Physical Exam  VS noted Constitutional:  Pt appears well-developed and well-nourished.  HENT: Head: Normocephalic.  Right Ear: External ear normal.  Left Ear: External ear normal.  Eyes: Conjunctivae and EOM are normal. Pupils are equal, round, and reactive to light.  Neck: Normal range of motion. Neck supple.  Cardiovascular: Normal rate and regular rhythm.   Pulmonary/Chest: Effort normal and breath sounds normal.  Abd:  Soft, NT, non-distended, + BS Right groin without hernia,, swelling, tender or mass Right hip with FROM with flexion, no pain elicited on int/ext rotation Neurological: Pt is alert. Not confused  Skin: Skin is warm. No erythema.  Psychiatric: Pt behavior is normal. Thought content normal.      Assessment & Plan:

## 2012-06-19 NOTE — Patient Instructions (Addendum)
Take all new medications as prescribed Continue all other medications as before You will be contacted regarding the referral for: urology Please take the information today with you to the appointment Your nexium was refilled to the pharmacy Please have the pharmacy call with any other refills you may need.

## 2012-06-19 NOTE — Assessment & Plan Note (Signed)
Was referred urology, but for some reason did not go;  Will re-refer today

## 2012-06-19 NOTE — Assessment & Plan Note (Signed)
C/w MSK strain most likely, for flexeril prn,  to f/u any worsening symptoms or concerns

## 2012-07-19 DIAGNOSIS — R972 Elevated prostate specific antigen [PSA]: Secondary | ICD-10-CM | POA: Diagnosis not present

## 2012-08-05 ENCOUNTER — Other Ambulatory Visit: Payer: Self-pay | Admitting: Endocrinology

## 2012-08-17 DIAGNOSIS — IMO0002 Reserved for concepts with insufficient information to code with codable children: Secondary | ICD-10-CM | POA: Diagnosis not present

## 2012-08-17 DIAGNOSIS — R972 Elevated prostate specific antigen [PSA]: Secondary | ICD-10-CM | POA: Diagnosis not present

## 2012-09-25 DIAGNOSIS — C61 Malignant neoplasm of prostate: Secondary | ICD-10-CM | POA: Diagnosis not present

## 2012-09-27 ENCOUNTER — Other Ambulatory Visit: Payer: Self-pay | Admitting: Urology

## 2012-10-09 ENCOUNTER — Other Ambulatory Visit: Payer: Self-pay | Admitting: Endocrinology

## 2012-10-09 ENCOUNTER — Other Ambulatory Visit: Payer: Self-pay | Admitting: *Deleted

## 2012-10-09 MED ORDER — TADALAFIL 20 MG PO TABS: 20.0000 mg | ORAL_TABLET | ORAL | Status: DC | PRN

## 2012-10-09 NOTE — Telephone Encounter (Signed)
Medication refill request.

## 2012-10-11 DIAGNOSIS — C61 Malignant neoplasm of prostate: Secondary | ICD-10-CM | POA: Diagnosis not present

## 2012-10-11 DIAGNOSIS — R279 Unspecified lack of coordination: Secondary | ICD-10-CM | POA: Diagnosis not present

## 2012-10-11 DIAGNOSIS — M6281 Muscle weakness (generalized): Secondary | ICD-10-CM | POA: Diagnosis not present

## 2012-10-18 ENCOUNTER — Telehealth: Payer: Self-pay | Admitting: Endocrinology

## 2012-10-18 NOTE — Telephone Encounter (Signed)
Pt would like refill of cialis sent to pharmacy.

## 2012-10-19 ENCOUNTER — Other Ambulatory Visit: Payer: Self-pay | Admitting: Endocrinology

## 2012-10-19 MED ORDER — TADALAFIL 5 MG PO TABS: 5.0000 mg | ORAL_TABLET | Freq: Every day | ORAL | Status: DC

## 2012-10-19 MED ORDER — TADALAFIL 20 MG PO TABS: 20.0000 mg | ORAL_TABLET | ORAL | Status: DC | PRN

## 2012-10-19 NOTE — Telephone Encounter (Signed)
i printed 

## 2012-10-22 DIAGNOSIS — M6281 Muscle weakness (generalized): Secondary | ICD-10-CM | POA: Diagnosis not present

## 2012-10-22 DIAGNOSIS — R279 Unspecified lack of coordination: Secondary | ICD-10-CM | POA: Diagnosis not present

## 2012-10-22 DIAGNOSIS — R82998 Other abnormal findings in urine: Secondary | ICD-10-CM | POA: Diagnosis not present

## 2012-10-26 ENCOUNTER — Encounter (HOSPITAL_COMMUNITY): Payer: Self-pay | Admitting: Pharmacy Technician

## 2012-10-30 ENCOUNTER — Ambulatory Visit (HOSPITAL_COMMUNITY)
Admission: RE | Admit: 2012-10-30 | Discharge: 2012-10-30 | Disposition: A | Discharge status: Home / Self Care | Payer: Medicare Other | Source: Ambulatory Visit | Attending: Urology | Admitting: Urology

## 2012-10-30 ENCOUNTER — Encounter (HOSPITAL_COMMUNITY): Payer: Self-pay

## 2012-10-30 ENCOUNTER — Ambulatory Visit (HOSPITAL_COMMUNITY): Admission: RE | Admit: 2012-10-30 | Payer: Medicare Other | Source: Ambulatory Visit

## 2012-10-30 ENCOUNTER — Encounter (HOSPITAL_COMMUNITY)
Admission: RE | Admit: 2012-10-30 | Discharge: 2012-10-30 | Disposition: A | Discharge status: Home / Self Care | Payer: Medicare Other | Source: Ambulatory Visit | Attending: Urology | Admitting: Urology

## 2012-10-30 DIAGNOSIS — Z01818 Encounter for other preprocedural examination: Secondary | ICD-10-CM | POA: Diagnosis not present

## 2012-10-30 LAB — CBC
MCHC: 33.8 g/dL (ref 30.0–36.0)
Platelets: 187 10*3/uL (ref 150–400)
RDW: 12.6 % (ref 11.5–15.5)
WBC: 2.7 10*3/uL — ABNORMAL LOW (ref 4.0–10.5)

## 2012-10-30 LAB — SURGICAL PCR SCREEN
MRSA, PCR: NEGATIVE
Staphylococcus aureus: NEGATIVE

## 2012-10-30 LAB — BASIC METABOLIC PANEL
BUN: 12 mg/dL (ref 6–23)
Chloride: 104 mEq/L (ref 96–112)
GFR calc Af Amer: 67 mL/min — ABNORMAL LOW (ref >90)
GFR calc non Af Amer: 58 mL/min — ABNORMAL LOW (ref >90)
Potassium: 4.5 mEq/L (ref 3.5–5.1)
Sodium: 140 mEq/L (ref 135–145)

## 2012-10-30 NOTE — Pre-Procedure Instructions (Addendum)
10-30-12 EKG 02-29-12-Epic. CXR today. 10-30-12 1515 Note fax labs viewable in Epic.W. Sol Odor,RN.

## 2012-10-30 NOTE — Patient Instructions (Addendum)
20 Omar [name].  10/30/2012   Your procedure is scheduled on:12-5   -2013  Report to [facility] at     0515   AM.  Call this number if you have problems the morning of surgery: [phone]  Or Presurgical Testing [phone](Sherene Plancarte)   Remember:   Do not eat food:After Midnight.  Follow any bowel prep instructions per MD office.    Take these medicines the morning of surgery with A SIP OF WATER: Omeprazole. Tamsulosin.   Do not wear jewelry, make-up or nail polish.  Do not wear lotions, powders, or perfumes. You may wear deodorant.  Do not shave 48 hours prior to surgery.(face and neck okay, no shaving of legs)  Do not bring valuables to the hospital.  Contacts, dentures or bridgework may not be worn into surgery.  Leave suitcase in the car. After surgery it may be brought to your room.  For patients admitted to the hospital, checkout time is 11:00 AM the day of discharge.   Patients discharged the day of surgery will not be allowed to drive home. Must have responsible person with you x 24 hours once discharged.  Name and phone number of your driver: Asberry Lascola, spouse [phone]- cell  Special Instructions: CHG Shower Use Special Wash: see special instruction sheet.(avoid face and genitals)   Please read over the following fact sheets that you were given: MRSA Information, Blood Transfusion fact sheet, Incentive Spirometry Instruction.

## 2012-10-30 NOTE — Progress Notes (Signed)
10-30-12 1515 Pt. has hx. Of low WBC's-labs viewable in Epic. W. Kennon Portela

## 2012-11-08 ENCOUNTER — Inpatient Hospital Stay (HOSPITAL_COMMUNITY): Payer: Medicare Other | Admitting: Anesthesiology

## 2012-11-08 ENCOUNTER — Encounter (HOSPITAL_COMMUNITY)
Admission: RE | Disposition: A | Discharge status: Home / Self Care | Payer: Self-pay | Source: Ambulatory Visit | Attending: Urology

## 2012-11-08 ENCOUNTER — Observation Stay (HOSPITAL_COMMUNITY)
Admission: RE | Admit: 2012-11-08 | Discharge: 2012-11-09 | Disposition: A | Discharge status: Home / Self Care | Payer: Medicare Other | Source: Ambulatory Visit | Attending: Urology | Admitting: Urology

## 2012-11-08 ENCOUNTER — Encounter (HOSPITAL_COMMUNITY): Payer: Self-pay | Admitting: *Deleted

## 2012-11-08 ENCOUNTER — Encounter (HOSPITAL_COMMUNITY): Payer: Self-pay | Admitting: Anesthesiology

## 2012-11-08 DIAGNOSIS — K219 Gastro-esophageal reflux disease without esophagitis: Secondary | ICD-10-CM | POA: Diagnosis not present

## 2012-11-08 DIAGNOSIS — C61 Malignant neoplasm of prostate: Secondary | ICD-10-CM | POA: Diagnosis not present

## 2012-11-08 DIAGNOSIS — N401 Enlarged prostate with lower urinary tract symptoms: Secondary | ICD-10-CM | POA: Diagnosis not present

## 2012-11-08 DIAGNOSIS — G5600 Carpal tunnel syndrome, unspecified upper limb: Secondary | ICD-10-CM | POA: Diagnosis not present

## 2012-11-08 DIAGNOSIS — Z23 Encounter for immunization: Secondary | ICD-10-CM | POA: Diagnosis not present

## 2012-11-08 DIAGNOSIS — Z79899 Other long term (current) drug therapy: Secondary | ICD-10-CM | POA: Insufficient documentation

## 2012-11-08 DIAGNOSIS — N529 Male erectile dysfunction, unspecified: Secondary | ICD-10-CM | POA: Insufficient documentation

## 2012-11-08 DIAGNOSIS — E785 Hyperlipidemia, unspecified: Secondary | ICD-10-CM | POA: Diagnosis not present

## 2012-11-08 HISTORY — DX: Malignant neoplasm of prostate: C61

## 2012-11-08 HISTORY — PX: ROBOT ASSISTED LAPAROSCOPIC RADICAL PROSTATECTOMY: SHX5141

## 2012-11-08 LAB — CBC
MCH: 27.3 pg (ref 26.0–34.0)
MCHC: 33.7 g/dL (ref 30.0–36.0)
Platelets: 186 10*3/uL (ref 150–400)
RBC: 5.2 MIL/uL (ref 4.22–5.81)

## 2012-11-08 LAB — CREATININE, SERUM
Creatinine, Ser: 1.13 mg/dL (ref 0.50–1.35)
GFR calc non Af Amer: 66 mL/min — ABNORMAL LOW (ref >90)

## 2012-11-08 SURGERY — ROBOTIC ASSISTED LAPAROSCOPIC RADICAL PROSTATECTOMY LEVEL 1
Anesthesia: General | Wound class: Clean Contaminated

## 2012-11-08 MED ORDER — ROCURONIUM BROMIDE 100 MG/10ML IV SOLN
INTRAVENOUS | Status: DC | PRN
  Administered 2012-11-08: 10 mg via INTRAVENOUS
  Administered 2012-11-08: 50 mg via INTRAVENOUS
  Administered 2012-11-08: 10 mg via INTRAVENOUS
  Administered 2012-11-08 (×2): 5 mg via INTRAVENOUS
  Administered 2012-11-08: 10 mg via INTRAVENOUS
  Administered 2012-11-08: 20 mg via INTRAVENOUS

## 2012-11-08 MED ORDER — PROPOFOL 10 MG/ML IV BOLUS
INTRAVENOUS | Status: DC | PRN
  Administered 2012-11-08: 150 mg via INTRAVENOUS

## 2012-11-08 MED ORDER — HEPARIN SODIUM (PORCINE) 5000 UNIT/ML IJ SOLN
5000.0000 [IU] | Freq: Three times a day (TID) | INTRAMUSCULAR | Status: DC
  Administered 2012-11-08 – 2012-11-09 (×3): 5000 [IU] via SUBCUTANEOUS
  Filled 2012-11-08 (×7): qty 1

## 2012-11-08 MED ORDER — MORPHINE SULFATE 2 MG/ML IJ SOLN: 2.0000 mg | INTRAMUSCULAR | Status: DC | PRN

## 2012-11-08 MED ORDER — SODIUM CHLORIDE 0.9 % IR SOLN
Status: DC | PRN
  Administered 2012-11-08: 1000 mL

## 2012-11-08 MED ORDER — ONDANSETRON HCL 4 MG/2ML IJ SOLN
INTRAMUSCULAR | Status: DC | PRN
  Administered 2012-11-08: 4 mg via INTRAVENOUS

## 2012-11-08 MED ORDER — STERILE WATER FOR IRRIGATION IR SOLN
Status: DC | PRN
  Administered 2012-11-08: 3000 mL

## 2012-11-08 MED ORDER — CEFAZOLIN SODIUM-DEXTROSE 2-3 GM-% IV SOLR
2.0000 g | Freq: Three times a day (TID) | INTRAVENOUS | Status: AC
  Administered 2012-11-08 (×2): 2 g via INTRAVENOUS
  Filled 2012-11-08 (×2): qty 50

## 2012-11-08 MED ORDER — NEOSTIGMINE METHYLSULFATE 1 MG/ML IJ SOLN
INTRAMUSCULAR | Status: DC | PRN
  Administered 2012-11-08: 4 mg via INTRAVENOUS

## 2012-11-08 MED ORDER — HYDROMORPHONE HCL PF 1 MG/ML IJ SOLN
INTRAMUSCULAR | Status: AC
  Filled 2012-11-08: qty 1

## 2012-11-08 MED ORDER — HYDROMORPHONE HCL PF 1 MG/ML IJ SOLN
0.2500 mg | INTRAMUSCULAR | Status: DC | PRN
  Administered 2012-11-08 (×3): 0.5 mg via INTRAVENOUS

## 2012-11-08 MED ORDER — ACETAMINOPHEN 10 MG/ML IV SOLN
INTRAVENOUS | Status: DC | PRN
  Administered 2012-11-08: 1000 mg via INTRAVENOUS

## 2012-11-08 MED ORDER — LACTATED RINGERS IV SOLN
INTRAVENOUS | Status: DC | PRN
  Administered 2012-11-08: 08:00:00

## 2012-11-08 MED ORDER — BISACODYL 10 MG RE SUPP
10.0000 mg | Freq: Two times a day (BID) | RECTAL | Status: DC
  Administered 2012-11-08 – 2012-11-09 (×2): 10 mg via RECTAL
  Filled 2012-11-08 (×2): qty 1

## 2012-11-08 MED ORDER — ACETAMINOPHEN 325 MG PO TABS: 650.0000 mg | ORAL_TABLET | ORAL | Status: DC | PRN

## 2012-11-08 MED ORDER — PROMETHAZINE HCL 25 MG/ML IJ SOLN: 6.2500 mg | INTRAMUSCULAR | Status: DC | PRN

## 2012-11-08 MED ORDER — PNEUMOCOCCAL VAC POLYVALENT 25 MCG/0.5ML IJ INJ
0.5000 mL | INJECTION | INTRAMUSCULAR | Status: AC
  Administered 2012-11-09: 0.5 mL via INTRAMUSCULAR
  Filled 2012-11-08: qty 0.5

## 2012-11-08 MED ORDER — GLYCOPYRROLATE 0.2 MG/ML IJ SOLN
INTRAMUSCULAR | Status: DC | PRN
  Administered 2012-11-08: .6 mg via INTRAVENOUS

## 2012-11-08 MED ORDER — HEPARIN SODIUM (PORCINE) 5000 UNIT/ML IJ SOLN
5000.0000 [IU] | INTRAMUSCULAR | Status: AC
  Administered 2012-11-08: 5000 [IU] via SUBCUTANEOUS
  Filled 2012-11-08: qty 1

## 2012-11-08 MED ORDER — SENNOSIDES-DOCUSATE SODIUM 8.6-50 MG PO TABS
1.0000 | ORAL_TABLET | Freq: Two times a day (BID) | ORAL | Status: DC
  Administered 2012-11-08 – 2012-11-09 (×2): 1 via ORAL
  Filled 2012-11-08 (×3): qty 1

## 2012-11-08 MED ORDER — ATORVASTATIN CALCIUM 40 MG PO TABS
40.0000 mg | ORAL_TABLET | Freq: Every day | ORAL | Status: DC
  Administered 2012-11-08: 40 mg via ORAL
  Filled 2012-11-08 (×2): qty 1

## 2012-11-08 MED ORDER — LACTATED RINGERS IV SOLN
INTRAVENOUS | Status: DC | PRN
  Administered 2012-11-08 (×2): via INTRAVENOUS

## 2012-11-08 MED ORDER — PANTOPRAZOLE SODIUM 40 MG PO TBEC
40.0000 mg | DELAYED_RELEASE_TABLET | Freq: Every day | ORAL | Status: DC
  Administered 2012-11-09: 40 mg via ORAL
  Filled 2012-11-08: qty 1

## 2012-11-08 MED ORDER — BACITRACIN-NEOMYCIN-POLYMYXIN 400-5-5000 EX OINT: 1.0000 "application " | TOPICAL_OINTMENT | Freq: Three times a day (TID) | CUTANEOUS | Status: DC | PRN

## 2012-11-08 MED ORDER — FENTANYL CITRATE 0.05 MG/ML IJ SOLN
INTRAMUSCULAR | Status: DC | PRN
  Administered 2012-11-08: 25 ug via INTRAVENOUS
  Administered 2012-11-08 (×6): 50 ug via INTRAVENOUS
  Administered 2012-11-08: 25 ug via INTRAVENOUS
  Administered 2012-11-08: 100 ug via INTRAVENOUS

## 2012-11-08 MED ORDER — ONDANSETRON HCL 4 MG/2ML IJ SOLN: 4.0000 mg | INTRAMUSCULAR | Status: DC | PRN

## 2012-11-08 MED ORDER — ACETAMINOPHEN 10 MG/ML IV SOLN
1000.0000 mg | Freq: Four times a day (QID) | INTRAVENOUS | Status: DC
  Administered 2012-11-08 – 2012-11-09 (×3): 1000 mg via INTRAVENOUS
  Filled 2012-11-08 (×4): qty 100

## 2012-11-08 MED ORDER — SODIUM CHLORIDE 0.9 % IV BOLUS (SEPSIS)
1000.0000 mL | Freq: Once | INTRAVENOUS | Status: AC
  Administered 2012-11-08: 1000 mL via INTRAVENOUS

## 2012-11-08 MED ORDER — OXYCODONE HCL 5 MG PO TABS: 5.0000 mg | ORAL_TABLET | ORAL | Status: DC | PRN

## 2012-11-08 MED ORDER — BUPIVACAINE LIPOSOME 1.3 % IJ SUSP
20.0000 mL | Freq: Once | INTRAMUSCULAR | Status: AC
  Administered 2012-11-08: 40 mL
  Filled 2012-11-08: qty 20

## 2012-11-08 MED ORDER — HYOSCYAMINE SULFATE 0.125 MG SL SUBL
0.1250 mg | SUBLINGUAL_TABLET | SUBLINGUAL | Status: DC | PRN
  Filled 2012-11-08: qty 1

## 2012-11-08 MED ORDER — SODIUM CHLORIDE 0.9 % IV SOLN
INTRAVENOUS | Status: DC
  Administered 2012-11-08: 125 mL/h via INTRAVENOUS

## 2012-11-08 MED ORDER — LACTATED RINGERS IV SOLN: INTRAVENOUS | Status: DC

## 2012-11-08 MED ORDER — DEXAMETHASONE SODIUM PHOSPHATE 10 MG/ML IJ SOLN
INTRAMUSCULAR | Status: DC | PRN
  Administered 2012-11-08: 10 mg via INTRAVENOUS

## 2012-11-08 MED ORDER — INDIGOTINDISULFONATE SODIUM 8 MG/ML IJ SOLN
INTRAMUSCULAR | Status: DC | PRN
  Administered 2012-11-08 (×2): 5 mL via INTRAVENOUS

## 2012-11-08 MED ORDER — MIDAZOLAM HCL 5 MG/5ML IJ SOLN
INTRAMUSCULAR | Status: DC | PRN
  Administered 2012-11-08: 2 mg via INTRAVENOUS

## 2012-11-08 MED ORDER — ARTIFICIAL TEARS OP OINT
TOPICAL_OINTMENT | OPHTHALMIC | Status: DC | PRN
  Administered 2012-11-08: 1 via OPHTHALMIC

## 2012-11-08 MED ORDER — CEFAZOLIN SODIUM-DEXTROSE 2-3 GM-% IV SOLR
2.0000 g | INTRAVENOUS | Status: AC
  Administered 2012-11-08: 2 g via INTRAVENOUS

## 2012-11-08 MED ORDER — INFLUENZA VIRUS VACC SPLIT PF IM SUSP
0.5000 mL | INTRAMUSCULAR | Status: AC
  Administered 2012-11-09: 0.5 mL via INTRAMUSCULAR
  Filled 2012-11-08: qty 0.5

## 2012-11-08 SURGICAL SUPPLY — 52 items
APPLIER CLIP 5 13 M/L LIGAMAX5 (MISCELLANEOUS) ×2
APR CLP MED LRG 5 ANG JAW (MISCELLANEOUS) ×1
CANISTER SUCTION 2500CC (MISCELLANEOUS) ×2 IMPLANT
CATH FOLEY 2WAY SLVR 18FR 30CC (CATHETERS) ×2 IMPLANT
CATH ROBINSON RED A/P 8FR (CATHETERS) ×2 IMPLANT
CATH TIEMANN FOLEY 18FR 5CC (CATHETERS) ×2 IMPLANT
CHLORAPREP W/TINT 26ML (MISCELLANEOUS) ×2 IMPLANT
CLIP APPLIE 5 13 M/L LIGAMAX5 (MISCELLANEOUS) ×1 IMPLANT
CLIP LIGATING HEM O LOK PURPLE (MISCELLANEOUS) IMPLANT
CLOTH BEACON ORANGE TIMEOUT ST (SAFETY) ×2 IMPLANT
CORD HIGH FREQUENCY UNIPOLAR (ELECTROSURGICAL) IMPLANT
COVER SURGICAL LIGHT HANDLE (MISCELLANEOUS) ×2 IMPLANT
COVER TIP SHEARS 8 DVNC (MISCELLANEOUS) ×1 IMPLANT
COVER TIP SHEARS 8MM DA VINCI (MISCELLANEOUS) ×1
CUTTER ECHEON FLEX ENDO 45 340 (ENDOMECHANICALS) ×2 IMPLANT
DECANTER SPIKE VIAL GLASS SM (MISCELLANEOUS) IMPLANT
DRAPE SURG IRRIG POUCH 19X23 (DRAPES) ×2 IMPLANT
DRAPE UTILITY XL STRL (DRAPES) IMPLANT
DRSG TEGADERM 2-3/8X2-3/4 SM (GAUZE/BANDAGES/DRESSINGS) ×8 IMPLANT
DRSG TEGADERM 4X4.75 (GAUZE/BANDAGES/DRESSINGS) ×4 IMPLANT
DRSG TEGADERM 6X8 (GAUZE/BANDAGES/DRESSINGS) ×4 IMPLANT
ELECT REM PT RETURN 9FT ADLT (ELECTROSURGICAL) ×2
ELECTRODE REM PT RTRN 9FT ADLT (ELECTROSURGICAL) ×1 IMPLANT
GLOVE BIOGEL M 7.0 STRL (GLOVE) IMPLANT
GLOVE BIOGEL PI IND STRL 7.5 (GLOVE) ×3 IMPLANT
GLOVE BIOGEL PI INDICATOR 7.5 (GLOVE) ×3
GLOVE ECLIPSE 7.0 STRL STRAW (GLOVE) ×4 IMPLANT
GOWN PREVENTION PLUS XLARGE (GOWN DISPOSABLE) ×2 IMPLANT
GOWN STRL NON-REIN LRG LVL3 (GOWN DISPOSABLE) ×4 IMPLANT
HOLDER FOLEY CATH W/STRAP (MISCELLANEOUS) ×2 IMPLANT
IV LACTATED RINGERS 1000ML (IV SOLUTION) ×2 IMPLANT
KIT ACCESSORY DA VINCI DISP (KITS) ×1
KIT ACCESSORY DVNC DISP (KITS) ×1 IMPLANT
NDL SAFETY ECLIPSE 18X1.5 (NEEDLE) ×1 IMPLANT
NEEDLE HYPO 18GX1.5 SHARP (NEEDLE) ×2
PACK ROBOT UROLOGY CUSTOM (CUSTOM PROCEDURE TRAY) ×2 IMPLANT
POSITIONER SURGICAL ARM (MISCELLANEOUS) IMPLANT
RELOAD GREEN ECHELON 45 (STAPLE) ×2 IMPLANT
SCRUB PCMX 4 OZ (MISCELLANEOUS) IMPLANT
SET TUBE IRRIG SUCTION NO TIP (IRRIGATION / IRRIGATOR) ×2 IMPLANT
SOLUTION ELECTROLUBE (MISCELLANEOUS) ×2 IMPLANT
SPONGE GAUZE 4X4 12PLY (GAUZE/BANDAGES/DRESSINGS) ×2 IMPLANT
SUT MNCRL 3 0 VIOLET RB1 (SUTURE) ×1 IMPLANT
SUT MONOCRYL 3 0 RB1 (SUTURE) ×1
SUT VIC AB 2-0 SH 27 (SUTURE) ×2
SUT VIC AB 2-0 SH 27X BRD (SUTURE) ×1 IMPLANT
SUT VIC AB 3-0 SH 27 (SUTURE) ×2
SUT VIC AB 3-0 SH 27XBRD (SUTURE) ×1 IMPLANT
SUT VICRYL 0 UR6 27IN ABS (SUTURE) ×6 IMPLANT
SYR 27GX1/2 1ML LL SAFETY (SYRINGE) ×2 IMPLANT
TOWEL OR NON WOVEN STRL DISP B (DISPOSABLE) ×2 IMPLANT
WATER STERILE IRR 1500ML POUR (IV SOLUTION) ×4 IMPLANT

## 2012-11-08 NOTE — Transfer of Care (Signed)
Immediate Anesthesia Transfer of Care Note  Patient: Omar [name].  Procedure(s) Performed: Procedure(s) (LRB) with comments: ROBOTIC ASSISTED LAPAROSCOPIC RADICAL PROSTATECTOMY LEVEL 1 (N/A) -    Patient Location: PACU  Anesthesia Type:General  Level of Consciousness: awake, alert , oriented and patient cooperative  Airway & Oxygen Therapy: Patient Spontanous Breathing and Patient connected to face mask oxygen  Post-op Assessment: Report given to PACU RN and Post -op Vital signs reviewed and stable  Post vital signs: Reviewed and stable  Complications: No apparent anesthesia complications

## 2012-11-08 NOTE — Preoperative (Signed)
Beta Blockers   Reason not to administer Beta Blockers:Not Applicable 

## 2012-11-08 NOTE — Op Note (Addendum)
DATE OF PROCEDURE: 11/08/12   OPERATIVE REPORT  SURGEON: Natalia Leatherwood, MD  ASSISTANT:  Pecola Leisure, PA  PREOPERATIVE DIAGNOSIS: Prostate cancer.  POSTOPERATIVE DIAGNOSIS: Prostate cancer.  PROCEDURE PERFORMED: Robotic-assisted laparoscopic radical  Prostatectomy, right nerve sparing approach, left non-nerve sparing approach. ESTIMATED BLOOD LOSS: 100 cc.  DRAINS: Jackson-Pratt drain, left lower quadrant and Foley catheter.  SPECIMEN: Prostate sent with seminal vesicles in its entirety.  FINDINGS: Large prostate. Poor surgical plane between the left neurovascular bundle and the prostate. Umbilical hernia. Marland Kitchen  HISTORY OF PRESENT ILLNESS: 66 year old male presents today with history of prostate cancer. His was Gleason 3+3 equals 6.  After evaluation and discussion of management options, he elected for surgical extirpation of his prostate.   PROCEDURE IN DETAIL: Informed consent was obtained. He received subcutaneous heparin for DVT prophylaxis before being taken to the OR. The patient was taken to the operating room, where he was placed in the supine position. IV antibiotics were infused and general anesthesia was induced. A time- out was performed, in which the correct patient, surgical site, and procedure were identified and agreed upon by the team. Hair was removed  from his abdomen and genitals and then he was placed in a dorsal  lithotomy position. All pertinent neurovascular pressure points were padded appropriately. His arms were tucked to the side using gel padding. After this, his abdomen and genitals were prepped and draped in the usual sterile fashion. A Foley catheter was placed on the field, 10 cc of sterile water was placed into the balloon.   Access was gained for laparoscopic procedure by using the Hasson technique by cutting down to the fascia in the midline above the umbilicus. It was noted that he did have an umbilical hernia. Once the peritoneum was accessed, a 10 mm   port was placed and abdomen was insufflated with carbon dioxide. Opening pressures were initially low so insufflation was continued. Next, markings were made for placement of the 1st & 3rd robotic arm ports in the usual areas with the ports being placed approximately 10 cm from the midline on either  side of camera (2nd) arm.  A 10 mm assistant port was placed on the far right lateral side of the abdomen. A 5 mm assistant port was placed between the right robotic (1st arm) and the camera port (2nd arm). The 4th robotic arm port was placed at the far left lateral side of the abdomen. All these were placed under direct visualization.   He was then placed in a Trendelenburg position, and the robot was docked to the robotic ports.  After this was done, the urachal remnant was identified and divided and the bladder was  Dissected from the anterior wall of the abdomen. This was taken down to  the endopelvic fascia bilaterally and the perineum was split down to the  vas deferens bilaterally. The vas deferens was divided bipolar and monopolar electrocautery. After this was done, the prograsp was used to  retract the bladder cephalad. After this was done, the endopelvic fascia was incised  bilaterally and carried anterior and posteriorly bilaterally. The  puboprostatic ligaments were then divided and then the stapler device  was used to divide and ligate the dorsal venous complex.   After this was complete, attention was turned back to the junction between the prostate and bladder neck.  Dissection was carried down between the prostate and the bladder until  the Foley catheter was encountered. It was deflated and then placed  through the dissection  site and then anterior traction was placed by  grasping the catheter with the 4th robot arm. The remainder of the bladder neck  was then dissected out, and dissection was carried down posteriorly making sure to avoid reentry into the bladder until the seminal  vesicles were encountered.   The seminal vesicles and vas deferens were then dissected out completely bilaterally. After  these were done, they were placed on traction anteriorly and blunt  dissection was carried out between the rectum and the prostate.   Attention was turned to the right side. It was evaluated and found to have a good plane of dissection, therefore a  nerve sparing technique was undertaken by incising medial to the neurovascular bundle and releasing it laterally. After this was carried  out, the right prostatic pedicle was controlled with sharp dissection  and Hem-o-lok clips. Dissection was then carried around posterior to  the prostate to the previous dissected area between the rectum and prostate.  Attention was turned to the left prostatic pedicle. It was evaluated and found to have no good surgical plane.  It was felt that further attempt at nerve sparing on this left side would jeopardize cancer control and a wide dissection of this side was carried out. The left prostatic pedicle was controlled with sharp dissection and Hem-o-lok clips.    Dissection was then carried up to the urethra which was all that remained attaching the prostate to the body. The urethra was  then divided with scissors and the prostate was placed to the side.   The pelvis was irrigated and flushing of air into the rectal catheter showed no air bubbles.   There was noted to be a small cystotomy on the left lateral side of the bladder neck. This was closed with a running 3-0 Vicryl. After this was complete. I inspected the inside of the bladder to ensure that the ureters were visible and that they were both effluxing urine. They were both seen to be effluxing blue urine as the patient had been given indigo carmine. After this, anastomosis of the bladder neck to the urethra was carried out. A supporting stitch using 2-0 Vicryl was used to reapproximate the posterior bladder neck to the area just inferior  to the urethra. Double-armed Monocryl suture was used in a running  fashion to perform the anastomosis. After this was done, a Foley  catheter was placed through the anastomosis and into the bladder. It was irrigated and found to be  water tight except for a very small posterior ooze when the bladder was filled with irrigation. It was tied down and the suture needles were removed from the  body.   A Jackson-Pratt drain was placed through the port of the 4th robot arm. The specimen was placed in the EndoCatch bag. The robot was  undocked and the operating table was leveled. The EndoCatch bag was removed from the body by enlarging  the midline of the umbilicus. All ports were checked and found to be  free of bleeding. The 10 mm assistant port was closed with a suture  passer under direct visualization. The fascia of the umbilical port was close with interrupted vicryl suture in a figure-of-eight fashion until there was no defect palpable. I did consider trying to close the patient's umbilical hernia by incorporating the hernia into the fascial opening for removal of the prostate specimen, but the hernia actually originated fairly inferiorly to the umbilicus. This would have required a very large incision to be made,  and I did not feel that this was appropriate at this time. Care was taken to avoid incorporation of intra-abdominal contents into the closure. After this was done, the drain was sutured into place, and the local injection with Exparel was performed. All wounds were irrigated with  sterile normal saline, an interrupted Vicryl suture was used to close the subcutaneous tissue of the camera port, and then closed with staples. A sterile dressing was then applied. Tegaderm and drain gauze was placed over the Jackson-Pratt drain and  Foley catheter was placed to closed drainage. The patient was placed  back in supine position. Anesthesia was reversed. He was taken to PACU  in stable condition. He  will be kept overnight for evaluation.      Counts were correct at the end of the procedure. There were no complications.

## 2012-11-08 NOTE — Progress Notes (Signed)
   CARE MANAGEMENT NOTE 11/08/2012  Patient:  Omar [name], Omar [name]   Account Number:  [id number]  Date Initiated:  11/08/2012  Documentation initiated by:  Jiles Crocker  Subjective/Objective Assessment:   ADMITTED FOR SURGERY - Robotic-assisted laparoscopic radical  Prostatectomy     Action/Plan:   LIVES AT HOME WITH SPOUSE; CM WILL FOLLOW FOR DCP   Anticipated DC Date:  11/10/2012   Anticipated DC Plan:  HOME/SELF CARE      DC Planning Services  CM consult              Status of service:  In process, will continue to follow Medicare Important Message given?  NA - LOS <3 / Initial given by admissions (If response is "NO", the following Medicare IM given date fields will be blank)  Per UR Regulation:  Reviewed for med. necessity/level of care/duration of stay  Comments:  11/08/2012- B Sonora Catlin RN,BSN,MHA

## 2012-11-08 NOTE — H&P (Signed)
Urology History and Physical Exam  CC: Prostate cancer  HPI: 66 year old male with prostate cancer presents for robot assisted laparoscopic radical prostatectomy. He was diagnosed via transrectal ultrasound biopsy due to an elevated PSA of 5.59.  He was found to have stage 1, Gleason grade 3+3=6 cancer in 3 cores. His prostate volume at that time was measured to be 76cc. His pre-biopsy SHIM score was 20; IPSS 12 (QoL 3). We have discussed his management options. He has chosen to proceed with robotic prostatectomy. We have reviewed the risks (including, but not limited to conversion to open prostatectomy), benefits, alternatives, and likelihood of achieving his goals. We have discussed the nerve-sparing approach and have agreed that this will be determined at the time of surgery. Urine culture 10/22/12 was negative for growth.  He has a history of leukopenia which has been worked up in the past, but he states no etiology was ever discovered. This has been stable over time.  PMH: Past Medical History  Diagnosis Date  . Depression   . Hyperlipidemia   . GERD (gastroesophageal reflux disease)   . History of colonic polyps   . Decreased white blood cell count   . Carpal tunnel syndrome   . Prostatism   . BPH (benign prostatic hypertrophy)   . ED (erectile dysfunction)   . History of kidney stones   . History of rheumatic fever   . History of esophageal stricture 10/31/2007  . Diverticulosis   . Gastric polyps     PSH: Past Surgical History  Procedure Date  . Inguinal hernia repair   . Knee arthroscopy     left  . Tonsillectomy   . Carpal tunnel release     Bilateral  . Esophageal dilation     last dilation 8'12    Allergies: Allergies  Allergen Reactions  . Sulfonamide Derivatives     REACTION: blisters on skin    Medications: Prescriptions prior to admission  Medication Sig Dispense Refill  . Omeprazole-Sodium Bicarbonate (ZEGERID) 20-1100 MG CAPS Take 1 capsule by  mouth daily before breakfast.      . simvastatin (ZOCOR) 80 MG tablet Take 80 mg by mouth at bedtime.      . Tamsulosin HCl (FLOMAX) 0.4 MG CAPS Take 0.4 mg by mouth daily.      . tadalafil (CIALIS) 5 MG tablet Take 1 tablet (5 mg total) by mouth daily.  90 tablet  3  . tadalafil (CIALIS) 5 MG tablet Take 5 mg by mouth daily.         Social History: History   Social History  . Marital Status: Married    Spouse Name: N/A    Number of Children: 4  . Years of Education: N/A   Occupational History  . pastor    Social History Main Topics  . Smoking status: Former Smoker    Types: Cigarettes  . Smokeless tobacco: Former Neurosurgeon    Quit date: 10/30/1982  . Alcohol Use: No  . Drug Use: No  . Sexually Active: Yes   Other Topics Concern  . Not on file   Social History Narrative  . No narrative on file    Family History: Family History  Problem Relation Age of Onset  . Ovarian cancer Mother   . Lung cancer Maternal Uncle   . Lung cancer Maternal Aunt   . Colon cancer Neg Hx   . Esophageal cancer Neg Hx   . Stomach cancer Neg Hx   . Rectal  cancer Neg Hx     Review of Systems: Positive: Hot flashes. Negative: Chest pain, SOB, or fever.  A further 10 point review of systems was negative except what is listed in the HPI.  Physical Exam: Filed Vitals:   11/08/12 0519  BP: 141/94  Pulse: 90  Temp: 97.4 F (36.3 C)  Resp: 18    General: No acute distress.  Awake. Head:  Normocephalic.  Atraumatic. ENT:  EOMI.  Mucous membranes moist Neck:  Supple.  No lymphadenopathy. CV:  S1 present. S2 present. Regular rate. Pulmonary: Equal effort bilaterally.  Clear to auscultation bilaterally. Abdomen: Soft.  Non- tender to palpation. Skin:  Normal turgor.  No visible rash. Extremity: No gross deformity of bilateral upper extremities.  No gross deformity of    bilateral lower extremities. Neurologic: Alert. Appropriate mood.    Studies:  No results found for this basename:  HGB:2,WBC:2,PLT:2 in the last 72 hours  No results found for this basename: NA:2,K:2,CL:2,CO2:2,BUN:2,CREATININE:2,CALCIUM:2,MAGNESIUM:2,GFRNONAA:2,GFRAA:2 in the last 72 hours   No results found for this basename: PT:2,INR:2,APTT:2 in the last 72 hours   No components found with this basename: ABG:2    Assessment:  Prostate cancer.  Plan: -To the OR for robot assisted laparoscopic radical prostatectomy with nerve sparing to be determined at the time of surgery.

## 2012-11-08 NOTE — Anesthesia Postprocedure Evaluation (Signed)
  Anesthesia Post-op Note  Patient: Omar [name].  Procedure(s) Performed: Procedure(s) (LRB): ROBOTIC ASSISTED LAPAROSCOPIC RADICAL PROSTATECTOMY LEVEL 1 (N/A)  Patient Location: PACU  Anesthesia Type: General  Level of Consciousness: awake and alert   Airway and Oxygen Therapy: Patient Spontanous Breathing  Post-op Pain: mild  Post-op Assessment: Post-op Vital signs reviewed, Patient's Cardiovascular Status Stable, Respiratory Function Stable, Patent Airway and No signs of Nausea or vomiting  Last Vitals:  Filed Vitals:   11/08/12 1210  BP: 141/88  Pulse: 68  Temp: 36.6 C  Resp: 16    Post-op Vital Signs: stable   Complications: No apparent anesthesia complications

## 2012-11-08 NOTE — Anesthesia Preprocedure Evaluation (Signed)
Anesthesia Evaluation  Patient identified by MRN, date of birth, ID band Patient awake    Reviewed: Allergy & Precautions, H&P , NPO status , Patient's Chart, lab work & pertinent test results  Airway Mallampati: II TM Distance: <3 FB Neck ROM: Full    Dental No notable dental hx.    Pulmonary neg pulmonary ROS,  breath sounds clear to auscultation  Pulmonary exam normal       Cardiovascular negative cardio ROS  Rhythm:Regular Rate:Normal     Neuro/Psych negative neurological ROS  negative psych ROS   GI/Hepatic Neg liver ROS, GERD-  Medicated,  Endo/Other  negative endocrine ROS  Renal/GU negative Renal ROS  negative genitourinary   Musculoskeletal negative musculoskeletal ROS (+)   Abdominal   Peds negative pediatric ROS (+)  Hematology negative hematology ROS (+)   Anesthesia Other Findings   Reproductive/Obstetrics negative OB ROS                           Anesthesia Physical Anesthesia Plan  ASA: II  Anesthesia Plan: General   Post-op Pain Management:    Induction: Intravenous  Airway Management Planned: Oral ETT  Additional Equipment:   Intra-op Plan:   Post-operative Plan: Extubation in OR  Informed Consent: I have reviewed the patients History and Physical, chart, labs and discussed the procedure including the risks, benefits and alternatives for the proposed anesthesia with the patient or authorized representative who has indicated his/her understanding and acceptance.   Dental advisory given  Plan Discussed with: CRNA and Surgeon  Anesthesia Plan Comments:         Anesthesia Quick Evaluation

## 2012-11-08 NOTE — Progress Notes (Signed)
Post op check.  Patient is doing well. No complaints at this time. Pain well controlled.  I described surgical findings and the course of the surgery to the patient.  PE: General: NAD, AAO Abd: soft, appropriately TTP, incisions dressed, JP serosanguinous GU: Foley in place, foreskin reduced, draining slightly clearer than amber colored urine.  Assessment: Prostate cancer. Robot prostatectomy today. Plan: -Ambulate -Regular diet -Incentive spirometry -Heparin for DVT prophylaxis

## 2012-11-09 ENCOUNTER — Encounter (HOSPITAL_COMMUNITY): Payer: Self-pay | Admitting: Urology

## 2012-11-09 DIAGNOSIS — C61 Malignant neoplasm of prostate: Secondary | ICD-10-CM

## 2012-11-09 HISTORY — DX: Malignant neoplasm of prostate: C61

## 2012-11-09 LAB — BASIC METABOLIC PANEL
BUN: 10 mg/dL (ref 6–23)
Calcium: 8.6 mg/dL (ref 8.4–10.5)
GFR calc Af Amer: 73 mL/min — ABNORMAL LOW (ref >90)
GFR calc non Af Amer: 63 mL/min — ABNORMAL LOW (ref >90)
Glucose, Bld: 105 mg/dL — ABNORMAL HIGH (ref 70–99)

## 2012-11-09 MED ORDER — OXYCODONE HCL 5 MG PO TABS: 5.0000 mg | ORAL_TABLET | ORAL | Status: DC | PRN

## 2012-11-09 MED ORDER — BISACODYL 10 MG RE SUPP: 10.0000 mg | Freq: Two times a day (BID) | RECTAL | Status: DC

## 2012-11-09 MED ORDER — CIPROFLOXACIN HCL 500 MG PO TABS: 250.0000 mg | ORAL_TABLET | Freq: Two times a day (BID) | ORAL | Status: DC

## 2012-11-09 MED ORDER — HYOSCYAMINE SULFATE 0.125 MG SL SUBL: 0.1250 mg | SUBLINGUAL_TABLET | SUBLINGUAL | Status: DC | PRN

## 2012-11-09 MED ORDER — BACITRACIN-NEOMYCIN-POLYMYXIN 400-5-5000 EX OINT: 1.0000 "application " | TOPICAL_OINTMENT | Freq: Three times a day (TID) | CUTANEOUS | Status: DC

## 2012-11-09 MED ORDER — SENNOSIDES-DOCUSATE SODIUM 8.6-50 MG PO TABS: 1.0000 | ORAL_TABLET | Freq: Two times a day (BID) | ORAL | Status: DC

## 2012-11-09 MED ORDER — OXYBUTYNIN CHLORIDE 5 MG PO TABS: 5.0000 mg | ORAL_TABLET | Freq: Four times a day (QID) | ORAL | Status: DC | PRN

## 2012-11-09 NOTE — Discharge Summary (Signed)
Physician Discharge Summary  Patient ID: Omar [name]. MRN: [id number] DOB/AGE: 66-19-1947 66 y.o.  Admit date: 11/08/2012 Discharge date: 11/09/2012  Admission Diagnoses: Prostate cancer  Discharge Diagnoses:  Principal Problem:  *Prostate cancer   Discharged Condition: good  Hospital Course:  The patient was admitted to the hospital following robotic prostatectomy. He was able to ambulate the day of surgery and tolerate a regular diet. His pain was controlled with PO medications. His JP drain had a low output and this was removed by the nursing staff. He was taught foley catheter care. He did not have flatus or a BM, but it was felt that he could be discharged home on a bowel regiment.  Consults: None  Significant Diagnostic Studies: None.  Treatments: surgery: Robotic radical prostatectomy.  Discharge Exam: Blood pressure 136/82, pulse 75, temperature 98 F (36.7 C), temperature source Oral, resp. rate 18, height 6' 3.5" (1.918 m), weight 94.9 kg (209 lb 3.5 oz), SpO2 100.00%. See PE from progress note on date of discharge.  Disposition: 01-Home or Self Care  Discharge Orders    Future Orders Please Complete By Expires   Discharge patient          Medication List     As of 11/09/2012  6:32 AM    STOP taking these medications         tadalafil 5 MG tablet   Commonly known as: CIALIS      Tamsulosin HCl 0.4 MG Caps   Commonly known as: FLOMAX      TAKE these medications         bisacodyl 10 MG suppository   Commonly known as: DULCOLAX   Place 1 suppository (10 mg total) rectally 2 (two) times daily.      ciprofloxacin 500 MG tablet   Commonly known as: CIPRO   Take 0.5 tablets (250 mg total) by mouth 2 (two) times daily.      hyoscyamine 0.125 MG SL tablet   Commonly known as: LEVSIN SL   Take 1 tablet (0.125 mg total) by mouth every 4 (four) hours as needed (bladder spasms).      neomycin-bacitracin-polymyxin ointment   Commonly known as: NEOSPORIN    Apply 1 application topically 3 (three) times daily. apply to tip of penis      Omeprazole-Sodium Bicarbonate 20-1100 MG Caps   Commonly known as: ZEGERID   Take 1 capsule by mouth daily before breakfast.      oxybutynin 5 MG tablet   Commonly known as: DITROPAN   Take 1 tablet (5 mg total) by mouth every 6 (six) hours as needed (bladder spasms).      oxyCODONE 5 MG immediate release tablet   Commonly known as: Oxy IR/ROXICODONE   Take 1-2 tablets (5-10 mg total) by mouth every 4 (four) hours as needed for pain.      senna-docusate 8.6-50 MG per tablet   Commonly known as: Senokot-S   Take 1 tablet by mouth 2 (two) times daily.      simvastatin 80 MG tablet   Commonly known as: ZOCOR   Take 80 mg by mouth at bedtime.           Follow-up Information    Follow up with Milford Cage, MD. On 11/19/2012. (8:45 am)    Contact information:   [address] [place] FLOOR [address], [place] [address] [phone]          Signed: Milford Cage 11/09/2012, 6:32  AM

## 2012-11-09 NOTE — Progress Notes (Signed)
Urology Progress Note  Subjective:     No acute urologic events overnight. Pain well controlled. He has ambulated several times without problems. Negative flatus or BM. Tolerating regular diet.   ROS: Negative: chest pain or nausea.  Objective:  Patient Vitals for the past 24 hrs:  BP Temp Temp src Pulse Resp SpO2 Height Weight  11/09/12 0523 136/82 mmHg 98 F (36.7 C) Oral 75  18  100 % - -  11/08/12 2100 151/100 mmHg 97.8 F (36.6 C) Oral 89  20  94 % - -  11/08/12 1455 - - - - - - 6' 3.5" (1.918 m) 94.9 kg (209 lb 3.5 oz)  11/08/12 1430 150/91 mmHg 98.3 F (36.8 C) Oral 88  16  98 % - -  11/08/12 1415 147/84 mmHg - - 71  17  100 % - -  11/08/12 1400 153/90 mmHg 97.4 F (36.3 C) - 81  22  99 % - -  11/08/12 1345 143/95 mmHg - - 69  19  100 % - -  11/08/12 1330 154/92 mmHg 97.4 F (36.3 C) - 69  17  100 % - -  11/08/12 1315 170/98 mmHg 97.4 F (36.3 C) - 77  15  100 % - -  11/08/12 1300 158/93 mmHg 97.4 F (36.3 C) - 77  13  100 % - -  11/08/12 1245 152/93 mmHg - - 70  16  100 % - -  11/08/12 1230 148/88 mmHg - - 73  16  100 % - -  11/08/12 1215 138/84 mmHg - - 67  16  - - -  11/08/12 1210 141/88 mmHg 97.8 F (36.6 C) - 68  16  100 % - -    Physical Exam: General:  No acute distress, awake Cardiovascular:    [x]   S1/S2 present, RRR  []   Irregularly irregular Chest:  CTA-B Abdomen:               []  Soft, appropriately TTP  []  Soft, NTTP  [x]  Soft, appropriately TTP, incision(s) dressed without soakage, JP serosanguinous  Genitourinary: Foreskin reduced. Foley in place. Foley:  Draining clear yellow urine.    I/O last 3 completed shifts: In: 3418.3 [I.V.:2358.3; Other:10; IV Piggyback:1050] Out: 630 [Urine:500; Drains:30; Blood:100]  Recent Labs  [facility] 11/09/12 0438 11/08/12 1540   HGB 13.3 14.2   WBC -- 8.6   PLT -- 186    Recent Labs  Basename 11/09/12 0438 11/08/12 1540   NA 137 --   K 4.1 --   CL 103 --   CO2 27 --   BUN 10 --   CREATININE  1.17 1.13   CALCIUM 8.6 --   GFRNONAA 63* 66*   GFRAA 73* 76*     No results found for this basename: PT:2,INR:2,APTT:2 in the last 72 hours   No components found with this basename: ABG:2    Length of stay: 1 days.  Assessment: Prostate cancer POD#1 Robot radical prostatectomy   Plan: -Discontinue JP drain. -Nurse to teach foley catheter care. -Saline lock IV. -Discharge home. Patient instructed to remove surgical dressings tomorrow. We discussed the discharge instructions, please refer to these instructions for details.   Natalia Leatherwood, MD [phone]

## 2012-12-10 DIAGNOSIS — N393 Stress incontinence (female) (male): Secondary | ICD-10-CM | POA: Diagnosis not present

## 2012-12-10 DIAGNOSIS — M6281 Muscle weakness (generalized): Secondary | ICD-10-CM | POA: Diagnosis not present

## 2012-12-10 DIAGNOSIS — R279 Unspecified lack of coordination: Secondary | ICD-10-CM | POA: Diagnosis not present

## 2013-01-31 DIAGNOSIS — N453 Epididymo-orchitis: Secondary | ICD-10-CM | POA: Diagnosis not present

## 2013-02-14 ENCOUNTER — Other Ambulatory Visit: Payer: Self-pay | Admitting: Endocrinology

## 2013-02-18 DIAGNOSIS — N529 Male erectile dysfunction, unspecified: Secondary | ICD-10-CM | POA: Diagnosis not present

## 2013-02-18 DIAGNOSIS — N453 Epididymo-orchitis: Secondary | ICD-10-CM | POA: Diagnosis not present

## 2013-02-18 DIAGNOSIS — C61 Malignant neoplasm of prostate: Secondary | ICD-10-CM | POA: Diagnosis not present

## 2013-02-18 DIAGNOSIS — N393 Stress incontinence (female) (male): Secondary | ICD-10-CM | POA: Diagnosis not present

## 2013-03-28 DIAGNOSIS — R109 Unspecified abdominal pain: Secondary | ICD-10-CM | POA: Diagnosis not present

## 2013-04-08 ENCOUNTER — Encounter (INDEPENDENT_AMBULATORY_CARE_PROVIDER_SITE_OTHER): Payer: Self-pay | Admitting: General Surgery

## 2013-04-08 ENCOUNTER — Ambulatory Visit (INDEPENDENT_AMBULATORY_CARE_PROVIDER_SITE_OTHER): Payer: Medicare Other | Admitting: General Surgery

## 2013-04-08 VITALS — BP 138/88 | HR 80 | Temp 98.0°F | Ht 75.5 in | Wt 202.4 lb

## 2013-04-08 DIAGNOSIS — K429 Umbilical hernia without obstruction or gangrene: Secondary | ICD-10-CM

## 2013-04-08 NOTE — Patient Instructions (Signed)
[facility] _______[facility]  UMBILICAL HERNIA REPAIR: POST OP INSTRUCTIONS  Always review your discharge instruction sheet given to you by the facility where your surgery was performed. IF YOU HAVE DISABILITY OR FAMILY LEAVE FORMS, YOU MUST BRING THEM TO THE OFFICE FOR PROCESSING.   DO NOT GIVE THEM TO YOUR DOCTOR.  1. A  prescription for pain medication may be given to you upon discharge.  Take your pain medication as prescribed, if needed.  If narcotic pain medicine is not needed, then you may take acetaminophen (Tylenol) or ibuprofen (Advil) as needed. 2. Take your usually prescribed medications unless otherwise directed. 3. If you need a refill on your pain medication, please contact your pharmacy.  They will contact our office to request authorization. Prescriptions will not be filled after 5 pm or on week-ends. 4. You should follow a light diet the first 24 hours after arrival home, such as soup and crackers, etc.  Be sure to include lots of fluids daily.  Resume your normal diet the day after surgery. 5. Most patients will experience some swelling and bruising around the umbilicus.  Ice packs and reclining will help.  Swelling and bruising can take several days to resolve.  6. It is common to experience some constipation if taking pain medication after surgery.  Increasing fluid intake and taking a stool softener (such as Colace) will usually help or prevent this problem from occurring.  A mild laxative (Milk of Magnesia or Miralax) should be taken according to package directions if there are no bowel movements after 48 hours. 7. Unless discharge instructions indicate otherwise, you may remove your bandages 24-48 hours after surgery, and you may shower at that time.  You may have steri-strips (small skin tapes) in place directly over the incision.  These strips should be left on the skin for 7-10 days.  If your surgeon used skin glue on the incision, you may shower in 24 hours.  The  glue will flake off over the next 2-3 weeks.  Any sutures or staples will be removed at the office during your follow-up visit. 8. ACTIVITIES:  You may resume regular (light) daily activities beginning the next day-such as daily self-care, walking, climbing stairs-gradually increasing activities as tolerated.  You may have sexual intercourse when it is comfortable.  Refrain from any heavy lifting or straining until approved by your doctor. a. You may drive when you are no longer taking prescription pain medication, you can comfortably wear a seatbelt, and you can safely maneuver your car and apply brakes. b. RETURN TO WORK:  __________________________________________________________ 9. You should see your doctor in the office for a follow-up appointment approximately 2-3 weeks after your surgery.  Make sure that you call for this appointment within a day or two after you arrive home to insure a convenient appointment time. 10. OTHER INSTRUCTIONS:  __________________________________________________________________________________________________________________________________________________________________________________________  WHEN TO CALL YOUR DOCTOR: 1. Fever over 101.0 2. Inability to urinate 3. Nausea and/or vomiting 4. Extreme swelling or bruising 5. Continued bleeding from incision. 6. Increased pain, redness, or drainage from the incision  The clinic staff is available to answer your questions during regular business hours.  Please don't hesitate to call and ask to speak to one of the nurses for clinical concerns.  If you have a medical emergency, go to the nearest emergency room or call 911.  A surgeon from [facility] is always on call at the hospital   [address], [address], [place], [place]    [address] ?  [address] [address], [place], [place]   [address] [phone] ? [phone] ? FAX [phone] Web site: [email or web address] 

## 2013-04-08 NOTE — Progress Notes (Signed)
Patient ID: Omar H Turner Jr., male   DOB: 04/05/1946, 67 y.o.   MRN: [id number]  Chief Complaint  Patient presents with  . New Evaluation    eval ventral hernia    HPI Omar H Riedlinger Jr. is a 67 y.o. male.   HPI  He is referred by Dr. Woodruff for further treatment of an umbilical hernia.  He underwent a robotic assisted prostatectomy in December of 2013. At that time he was discovered to have an umbilical hernia. It is becoming symptomatic and he has intermittent shooting pains from the area. No obstructive symptoms.  He is referred here to discuss repair.  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  Past Medical History  Diagnosis Date  . Depression   . Hyperlipidemia   . GERD (gastroesophageal reflux disease)   . History of colonic polyps   . Decreased white blood cell count   . Carpal tunnel syndrome   . Prostatism   . BPH (benign prostatic hypertrophy)   . ED (erectile dysfunction)   . History of kidney stones   . History of rheumatic fever   . History of esophageal stricture 10/31/2007  . Diverticulosis   . Gastric polyps   . Prostate cancer 11/09/2012    Past Surgical History  Procedure Laterality Date  . Inguinal hernia repair    . Knee arthroscopy      left  . Tonsillectomy    . Carpal tunnel release      Bilateral  . Esophageal dilation      last dilation 8'12  . Robot assisted laparoscopic radical prostatectomy  11/08/2012    Procedure: ROBOTIC ASSISTED LAPAROSCOPIC RADICAL PROSTATECTOMY LEVEL 1;  Surgeon: Daniel Young Woodruff, MD;  Location: [facility];  Service: Urology;  Laterality: N/A;       Family History  Problem Relation Age of Onset  . Ovarian cancer Mother   . Cancer Mother   . Lung cancer Maternal Uncle   . Lung cancer Maternal Aunt   . Colon cancer Neg Hx   . Esophageal cancer Neg Hx   . Stomach cancer Neg Hx   . Rectal cancer Neg Hx   . Stroke Father     Social History History  Substance Use Topics  . Smoking status: Former Smoker   Types: Cigarettes  . Smokeless tobacco: Former User    Quit date: 10/30/1994  . Alcohol Use: No    Allergies  Allergen Reactions  . Sulfonamide Derivatives     REACTION: blisters on skin    Current Outpatient Prescriptions  Medication Sig Dispense Refill  . Omeprazole-Sodium Bicarbonate (ZEGERID) 20-1100 MG CAPS Take 1 capsule by mouth daily before breakfast.      . simvastatin (ZOCOR) 80 MG tablet TAKE 1 TABLET BY MOUTH AT BEDTIME  30 tablet  5  . tadalafil (CIALIS) 5 MG tablet Take 5 mg by mouth daily as needed for erectile dysfunction.      . [DISCONTINUED] vardenafil (LEVITRA) 20 MG tablet Take 20 mg by mouth as needed.         No current facility-administered medications for this visit.    Review of Systems Review of Systems  Constitutional: Negative.   Respiratory: Negative.   Cardiovascular: Negative.   Gastrointestinal: Negative for constipation.  Genitourinary: Negative for difficulty urinating.    Blood pressure 138/88, pulse 80, temperature 98 F (36.7 C), temperature source Temporal, height 6' 3.5" (1.918 m), weight 202 lb 6.4 oz (91.808 kg), SpO2 97.00%.    Physical Exam Physical Exam  Constitutional: He appears well-developed and well-nourished. No distress.  HENT:  Head: Normocephalic and atraumatic.  Neck: Neck supple.  Cardiovascular: Normal rate and regular rhythm.   Pulmonary/Chest: Effort normal and breath sounds normal.  Abdominal: Soft. He exhibits no distension. There is no tenderness.  There is a moderate sized umbilical bulge that is reducible. There is a palpable fascial defect. Multiple small scars are noted.  Genitourinary:  No inguinal bulges.  Lymphadenopathy:    He has no cervical adenopathy.    Data Reviewed Dr. Woodruff's note.  Assessment    Symptomatic umbilical hernia.     Plan    Umbilical hernia repair with mesh.  I have discussed the procedure, risks, and aftercare.  Risks include but are not limited to bleeding,  infection, wound problems, anesthesia, recurrence, injury to intestine, mesh problems.  We also discussed not knowing what the umbilicus would look like in the future.  He seems to understand and agrees with the plan.        Dezaria Methot J 04/08/2013, 4:53 PM    

## 2013-04-10 ENCOUNTER — Encounter (HOSPITAL_COMMUNITY): Payer: Self-pay | Admitting: Pharmacy Technician

## 2013-04-11 ENCOUNTER — Encounter (HOSPITAL_COMMUNITY)
Admission: RE | Admit: 2013-04-11 | Discharge: 2013-04-11 | Disposition: A | Discharge status: Home / Self Care | Payer: Medicare Other | Source: Ambulatory Visit | Attending: General Surgery | Admitting: General Surgery

## 2013-04-11 ENCOUNTER — Encounter (HOSPITAL_COMMUNITY): Payer: Self-pay

## 2013-04-11 DIAGNOSIS — Z87891 Personal history of nicotine dependence: Secondary | ICD-10-CM | POA: Diagnosis not present

## 2013-04-11 DIAGNOSIS — F329 Major depressive disorder, single episode, unspecified: Secondary | ICD-10-CM | POA: Diagnosis not present

## 2013-04-11 DIAGNOSIS — K429 Umbilical hernia without obstruction or gangrene: Secondary | ICD-10-CM | POA: Diagnosis not present

## 2013-04-11 DIAGNOSIS — C61 Malignant neoplasm of prostate: Secondary | ICD-10-CM | POA: Diagnosis not present

## 2013-04-11 DIAGNOSIS — Z9079 Acquired absence of other genital organ(s): Secondary | ICD-10-CM | POA: Diagnosis not present

## 2013-04-11 DIAGNOSIS — K5730 Diverticulosis of large intestine without perforation or abscess without bleeding: Secondary | ICD-10-CM | POA: Diagnosis not present

## 2013-04-11 DIAGNOSIS — N529 Male erectile dysfunction, unspecified: Secondary | ICD-10-CM | POA: Diagnosis not present

## 2013-04-11 DIAGNOSIS — K219 Gastro-esophageal reflux disease without esophagitis: Secondary | ICD-10-CM | POA: Diagnosis not present

## 2013-04-11 DIAGNOSIS — E785 Hyperlipidemia, unspecified: Secondary | ICD-10-CM | POA: Diagnosis not present

## 2013-04-11 LAB — COMPREHENSIVE METABOLIC PANEL
ALT: 12 U/L (ref 0–53)
Albumin: 3.7 g/dL (ref 3.5–5.2)
Alkaline Phosphatase: 56 U/L (ref 39–117)
BUN: 14 mg/dL (ref 6–23)
Chloride: 104 mEq/L (ref 96–112)
Potassium: 4.1 mEq/L (ref 3.5–5.1)
Sodium: 140 mEq/L (ref 135–145)
Total Bilirubin: 0.6 mg/dL (ref 0.3–1.2)
Total Protein: 6.4 g/dL (ref 6.0–8.3)

## 2013-04-11 LAB — CBC WITH DIFFERENTIAL/PLATELET
Basophils Relative: 2 % — ABNORMAL HIGH (ref 0–1)
Eosinophils Absolute: 0.2 10*3/uL (ref 0.0–0.7)
Hemoglobin: 14.3 g/dL (ref 13.0–17.0)
Lymphs Abs: 1.1 10*3/uL (ref 0.7–4.0)
MCH: 27.2 pg (ref 26.0–34.0)
MCHC: 35 g/dL (ref 30.0–36.0)
Monocytes Relative: 9 % (ref 3–12)
Neutro Abs: 1.2 10*3/uL — ABNORMAL LOW (ref 1.7–7.7)
Neutrophils Relative %: 43 % (ref 43–77)
Platelets: 159 10*3/uL (ref 150–400)
RBC: 5.26 MIL/uL (ref 4.22–5.81)

## 2013-04-11 LAB — SURGICAL PCR SCREEN
MRSA, PCR: NEGATIVE
Staphylococcus aureus: NEGATIVE

## 2013-04-11 NOTE — Pre-Procedure Instructions (Signed)
Omar [name].  04/11/2013   Your procedure is scheduled on:  Apr 15, 2013  Report to [facility] at 9:20 AM.  Call this number if you have problems the morning of surgery: [phone]   Remember:   Do not eat food or drink liquids after midnight.   Take these medicines the morning of surgery with A SIP OF WATER: omeprazole(zegrid)   Do not wear jewelry, make-up or nail polish.  Do not wear lotions, powders, or perfumes. You may wear deodorant.  Do not shave 48 hours prior to surgery. Men may shave face and neck.  Do not bring valuables to the hospital.  Contacts, dentures or bridgework may not be worn into surgery.  Leave suitcase in the car. After surgery it may be brought to your room.  For patients admitted to the hospital, checkout time is 11:00 AM the day of  discharge.   Patients discharged the day of surgery will not be allowed to drive  home.  Name and phone number of your driver:   Special Instructions: Shower using CHG 2 nights before surgery and the night before surgery.  If you shower the day of surgery use CHG.  Use special wash - you have one bottle of CHG for all showers.  You should use approximately 1/3 of the bottle for each shower.   Please read over the following fact sheets that you were given: Pain Booklet, Coughing and Deep Breathing and Surgical Site Infection Prevention

## 2013-04-14 MED ORDER — CEFAZOLIN SODIUM-DEXTROSE 2-3 GM-% IV SOLR
2.0000 g | INTRAVENOUS | Status: AC
  Administered 2013-04-15: 2 g via INTRAVENOUS
  Filled 2013-04-14: qty 50

## 2013-04-15 ENCOUNTER — Encounter (HOSPITAL_COMMUNITY)
Admission: RE | Disposition: A | Discharge status: Home / Self Care | Payer: Self-pay | Source: Ambulatory Visit | Attending: General Surgery

## 2013-04-15 ENCOUNTER — Encounter (HOSPITAL_COMMUNITY): Payer: Self-pay | Admitting: *Deleted

## 2013-04-15 ENCOUNTER — Encounter (HOSPITAL_COMMUNITY): Payer: Self-pay | Admitting: Anesthesiology

## 2013-04-15 ENCOUNTER — Ambulatory Visit (HOSPITAL_COMMUNITY): Payer: Medicare Other | Admitting: Anesthesiology

## 2013-04-15 ENCOUNTER — Ambulatory Visit (HOSPITAL_COMMUNITY)
Admission: RE | Admit: 2013-04-15 | Discharge: 2013-04-15 | Disposition: A | Discharge status: Home / Self Care | Payer: Medicare Other | Source: Ambulatory Visit | Attending: General Surgery | Admitting: General Surgery

## 2013-04-15 DIAGNOSIS — E785 Hyperlipidemia, unspecified: Secondary | ICD-10-CM | POA: Diagnosis not present

## 2013-04-15 DIAGNOSIS — K219 Gastro-esophageal reflux disease without esophagitis: Secondary | ICD-10-CM | POA: Insufficient documentation

## 2013-04-15 DIAGNOSIS — F329 Major depressive disorder, single episode, unspecified: Secondary | ICD-10-CM | POA: Insufficient documentation

## 2013-04-15 DIAGNOSIS — Z87891 Personal history of nicotine dependence: Secondary | ICD-10-CM | POA: Insufficient documentation

## 2013-04-15 DIAGNOSIS — K429 Umbilical hernia without obstruction or gangrene: Secondary | ICD-10-CM | POA: Insufficient documentation

## 2013-04-15 DIAGNOSIS — F3289 Other specified depressive episodes: Secondary | ICD-10-CM | POA: Insufficient documentation

## 2013-04-15 DIAGNOSIS — N529 Male erectile dysfunction, unspecified: Secondary | ICD-10-CM | POA: Insufficient documentation

## 2013-04-15 DIAGNOSIS — Z9079 Acquired absence of other genital organ(s): Secondary | ICD-10-CM | POA: Insufficient documentation

## 2013-04-15 DIAGNOSIS — C61 Malignant neoplasm of prostate: Secondary | ICD-10-CM | POA: Insufficient documentation

## 2013-04-15 DIAGNOSIS — K5730 Diverticulosis of large intestine without perforation or abscess without bleeding: Secondary | ICD-10-CM | POA: Insufficient documentation

## 2013-04-15 HISTORY — PX: UMBILICAL HERNIA REPAIR: SHX196

## 2013-04-15 HISTORY — DX: Male erectile dysfunction, unspecified: N52.9

## 2013-04-15 HISTORY — PX: INSERTION OF MESH: SHX5868

## 2013-04-15 SURGERY — REPAIR, HERNIA, UMBILICAL, ADULT
Anesthesia: General | Site: Abdomen | Wound class: Clean

## 2013-04-15 MED ORDER — ONDANSETRON HCL 4 MG/2ML IJ SOLN: 4.0000 mg | Freq: Once | INTRAMUSCULAR | Status: DC | PRN

## 2013-04-15 MED ORDER — LACTATED RINGERS IV SOLN
INTRAVENOUS | Status: DC
  Administered 2013-04-15: 11:00:00 via INTRAVENOUS

## 2013-04-15 MED ORDER — ONDANSETRON HCL 4 MG/2ML IJ SOLN
INTRAMUSCULAR | Status: DC | PRN
  Administered 2013-04-15: 4 mg via INTRAVENOUS

## 2013-04-15 MED ORDER — BUPIVACAINE-EPINEPHRINE 0.5% -1:200000 IJ SOLN
INTRAMUSCULAR | Status: DC | PRN
  Administered 2013-04-15: 20 mL

## 2013-04-15 MED ORDER — ACETAMINOPHEN 10 MG/ML IV SOLN
1000.0000 mg | Freq: Once | INTRAVENOUS | Status: AC | PRN
  Administered 2013-04-15: 1000 mg via INTRAVENOUS

## 2013-04-15 MED ORDER — GLYCOPYRROLATE 0.2 MG/ML IJ SOLN
INTRAMUSCULAR | Status: DC | PRN
  Administered 2013-04-15: 0.1 mg via INTRAVENOUS
  Administered 2013-04-15: 0.4 mg via INTRAVENOUS

## 2013-04-15 MED ORDER — LIDOCAINE HCL (CARDIAC) 20 MG/ML IV SOLN
INTRAVENOUS | Status: DC | PRN
  Administered 2013-04-15: 40 mg via INTRAVENOUS

## 2013-04-15 MED ORDER — EPHEDRINE SULFATE 50 MG/ML IJ SOLN
INTRAMUSCULAR | Status: DC | PRN
  Administered 2013-04-15: 5 mg via INTRAVENOUS
  Administered 2013-04-15: 10 mg via INTRAVENOUS

## 2013-04-15 MED ORDER — BUPIVACAINE-EPINEPHRINE PF 0.5-1:200000 % IJ SOLN
INTRAMUSCULAR | Status: AC
  Filled 2013-04-15: qty 30

## 2013-04-15 MED ORDER — PHENYLEPHRINE HCL 10 MG/ML IJ SOLN
INTRAMUSCULAR | Status: DC | PRN
  Administered 2013-04-15 (×3): 80 ug via INTRAVENOUS
  Administered 2013-04-15: 40 ug via INTRAVENOUS

## 2013-04-15 MED ORDER — HYDROMORPHONE HCL PF 1 MG/ML IJ SOLN: 0.2500 mg | INTRAMUSCULAR | Status: DC | PRN

## 2013-04-15 MED ORDER — ROCURONIUM BROMIDE 100 MG/10ML IV SOLN
INTRAVENOUS | Status: DC | PRN
  Administered 2013-04-15: 40 mg via INTRAVENOUS

## 2013-04-15 MED ORDER — FENTANYL CITRATE 0.05 MG/ML IJ SOLN
INTRAMUSCULAR | Status: DC | PRN
  Administered 2013-04-15: 125 ug via INTRAVENOUS
  Administered 2013-04-15: 25 ug via INTRAVENOUS

## 2013-04-15 MED ORDER — MIDAZOLAM HCL 5 MG/5ML IJ SOLN
INTRAMUSCULAR | Status: DC | PRN
  Administered 2013-04-15: 2 mg via INTRAVENOUS

## 2013-04-15 MED ORDER — OXYCODONE HCL 5 MG PO TABS: 5.0000 mg | ORAL_TABLET | ORAL | Status: DC | PRN

## 2013-04-15 MED ORDER — 0.9 % SODIUM CHLORIDE (POUR BTL) OPTIME
TOPICAL | Status: DC | PRN
  Administered 2013-04-15: 1000 mL

## 2013-04-15 MED ORDER — NEOSTIGMINE METHYLSULFATE 1 MG/ML IJ SOLN
INTRAMUSCULAR | Status: DC | PRN
  Administered 2013-04-15: 3 mg via INTRAVENOUS

## 2013-04-15 MED ORDER — PROPOFOL 10 MG/ML IV BOLUS
INTRAVENOUS | Status: DC | PRN
  Administered 2013-04-15: 180 mg via INTRAVENOUS

## 2013-04-15 SURGICAL SUPPLY — 46 items
APL SKNCLS STERI-STRIP NONHPOA (GAUZE/BANDAGES/DRESSINGS) ×1
BENZOIN TINCTURE PRP APPL 2/3 (GAUZE/BANDAGES/DRESSINGS) ×2 IMPLANT
BLADE SURG 10 STRL SS (BLADE) ×2 IMPLANT
BLADE SURG 15 STRL LF DISP TIS (BLADE) ×1 IMPLANT
BLADE SURG 15 STRL SS (BLADE) ×2
CHLORAPREP W/TINT 26ML (MISCELLANEOUS) ×2 IMPLANT
CLOTH BEACON ORANGE TIMEOUT ST (SAFETY) ×2 IMPLANT
COVER SURGICAL LIGHT HANDLE (MISCELLANEOUS) ×2 IMPLANT
DECANTER SPIKE VIAL GLASS SM (MISCELLANEOUS) ×2 IMPLANT
DRAPE INCISE IOBAN 66X45 STRL (DRAPES) ×2 IMPLANT
DRAPE LAPAROSCOPIC ABDOMINAL (DRAPES) ×2 IMPLANT
DRAPE UTILITY 15X26 W/TAPE STR (DRAPE) ×4 IMPLANT
DRSG TEGADERM 4X4.75 (GAUZE/BANDAGES/DRESSINGS) ×2 IMPLANT
ELECT CAUTERY BLADE 6.4 (BLADE) ×2 IMPLANT
ELECT REM PT RETURN 9FT ADLT (ELECTROSURGICAL) ×2
ELECTRODE REM PT RTRN 9FT ADLT (ELECTROSURGICAL) ×1 IMPLANT
GAUZE SPONGE 4X4 16PLY XRAY LF (GAUZE/BANDAGES/DRESSINGS) ×2 IMPLANT
GLOVE BIOGEL PI IND STRL 6 (GLOVE) ×1 IMPLANT
GLOVE BIOGEL PI IND STRL 6.5 (GLOVE) ×1 IMPLANT
GLOVE BIOGEL PI IND STRL 8 (GLOVE) ×1 IMPLANT
GLOVE BIOGEL PI INDICATOR 6 (GLOVE) ×1
GLOVE BIOGEL PI INDICATOR 6.5 (GLOVE) ×1
GLOVE BIOGEL PI INDICATOR 8 (GLOVE) ×1
GLOVE ECLIPSE 8.0 STRL XLNG CF (GLOVE) ×2 IMPLANT
GOWN STRL NON-REIN LRG LVL3 (GOWN DISPOSABLE) ×6 IMPLANT
KIT BASIN OR (CUSTOM PROCEDURE TRAY) ×2 IMPLANT
KIT ROOM TURNOVER OR (KITS) ×2 IMPLANT
MESH BARD SOFT 3X6IN (Mesh General) IMPLANT
MESH HERNIA 3X6 (Mesh General) ×2 IMPLANT
NEEDLE HYPO 25GX1X1/2 BEV (NEEDLE) ×2 IMPLANT
NS IRRIG 1000ML POUR BTL (IV SOLUTION) ×2 IMPLANT
PACK SURGICAL SETUP 50X90 (CUSTOM PROCEDURE TRAY) ×2 IMPLANT
PAD ARMBOARD 7.5X6 YLW CONV (MISCELLANEOUS) ×4 IMPLANT
PENCIL BUTTON HOLSTER BLD 10FT (ELECTRODE) ×2 IMPLANT
SPONGE GAUZE 4X4 12PLY (GAUZE/BANDAGES/DRESSINGS) ×2 IMPLANT
STRIP CLOSURE SKIN 1/2X4 (GAUZE/BANDAGES/DRESSINGS) ×2 IMPLANT
SUT MNCRL AB 4-0 PS2 18 (SUTURE) ×2 IMPLANT
SUT PROLENE 0 CT 1 30 (SUTURE) ×2 IMPLANT
SUT PROLENE 0 CT 2 (SUTURE) ×2 IMPLANT
SUT SURG 0 T 19/GS 22 1969 62 (SUTURE) ×2 IMPLANT
SUT VIC AB 3-0 SH 27 (SUTURE) ×2
SUT VIC AB 3-0 SH 27XBRD (SUTURE) ×1 IMPLANT
SYR CONTROL 10ML LL (SYRINGE) ×2 IMPLANT
TOWEL OR 17X24 6PK STRL BLUE (TOWEL DISPOSABLE) ×2 IMPLANT
TOWEL OR 17X26 10 PK STRL BLUE (TOWEL DISPOSABLE) ×2 IMPLANT
WATER STERILE IRR 1000ML POUR (IV SOLUTION) IMPLANT

## 2013-04-15 NOTE — Op Note (Signed)
Preoperative diagnosis: Umbilical hernia  Postoperative diagnosis: Same  Procedure: Umbilical hernia repair with mesh.  Surgeon: Avel Peace M.D.  Anesthesia: General plus Marcaine local   Indication:This is a 68 year old male noted to have an umbilical hernia while he was having a prostatectomy.  He is having pain from it and now presents for repair.  Technique: He was seen in the holding room and then brought to the operating room, placed supine on the operating table, and a general anesthetic was given.  The hair in the periumbilical area was clipped as was necessary.  The periumbilical area was sterilely prepped and draped.  Marcaine solution was infiltrated superficially and deep in the periumbilical area. A subumbilical transverse incision was made through the skin and subcutaneous tissue until the fascia was identified. The subcutaneous tissue was mobilized free from the fascia inferiorly and laterally. The umbilical stalk was mobilized and dissected free from the fascia exposing the underlying hernia defect. The hernia sac and tissue within it were reduced back into the abdominal cavity.  The hernia measure approximately 2-3 cm.  The subcutaneous tissue was freed from the fascia for 3-4 cm around the primary defect. The primary defect was then closed with interrupted 0 Surgilon sutures. The sutures were left Garduno.  A piece of polypropylene mesh was brought into the field. The primary repair sutures were threaded up through the mesh and tied down anchoring the mesh directly over the primary repair. The periphery of the mesh was then anchored to the fascia with a running 0 Prolene suture to allow for 3-4 cm of mesh overlap. The excess mesh was trimmed and removed.  Hemostasis was adequate at this time. The umbilicus was reimplanted onto the mesh and fascia with 3-0 Vicryl suture. The subcutaneous tissue was closed over the mesh with a running 3-0 Vicryl suture. The skin was closed with a  4-0 Monocryl subcuticular stitch. Steri-Strips and sterile dressings were applied.  He tolerated the procedure well without any apparent complications and was taken to the recovery room in satisfactory condition.

## 2013-04-15 NOTE — Transfer of Care (Signed)
Immediate Anesthesia Transfer of Care Note  Patient: Omar [name].  Procedure(s) Performed: Procedure(s): HERNIA REPAIR UMBILICAL ADULT (N/A) INSERTION OF MESH (N/A)  Patient Location: PACU  Anesthesia Type:General  Level of Consciousness: awake, alert  and oriented  Airway & Oxygen Therapy: Patient Spontanous Breathing and Patient connected to nasal cannula oxygen  Post-op Assessment: Report given to PACU RN, Post -op Vital signs reviewed and stable and Patient moving all extremities X 4  Post vital signs: Reviewed and stable  Complications: No apparent anesthesia complications

## 2013-04-15 NOTE — Anesthesia Preprocedure Evaluation (Addendum)
Anesthesia Evaluation  Patient identified by MRN, date of birth, ID band Patient awake    Reviewed: Allergy & Precautions, H&P , NPO status , Patient's Chart, lab work & pertinent test results  Airway Mallampati: II TM Distance: >3 FB Neck ROM: Full    Dental  (+) Edentulous Upper and Edentulous Lower   Pulmonary  breath sounds clear to auscultation        Cardiovascular Rhythm:Regular Rate:Normal     Neuro/Psych PSYCHIATRIC DISORDERS Depression    GI/Hepatic GERD-  Medicated,  Endo/Other    Renal/GU      Musculoskeletal   Abdominal   Peds  Hematology   Anesthesia Other Findings   Reproductive/Obstetrics                          Anesthesia Physical Anesthesia Plan  ASA: II  Anesthesia Plan: General   Post-op Pain Management:    Induction: Intravenous  Airway Management Planned: Oral ETT  Additional Equipment:   Intra-op Plan:   Post-operative Plan: Extubation in OR  Informed Consent: I have reviewed the patients History and Physical, chart, labs and discussed the procedure including the risks, benefits and alternatives for the proposed anesthesia with the patient or authorized representative who has indicated his/her understanding and acceptance.   Dental advisory given  Plan Discussed with: CRNA, Anesthesiologist and Surgeon  Anesthesia Plan Comments: (Ventral Hernia S/P Robotic prostatectomy H/O prostate Ca GERD H/O esophageal stricture S/P dilatations  Plan GA with oral ETT  Kipp Brood, MD )       Anesthesia Quick Evaluation

## 2013-04-15 NOTE — Anesthesia Procedure Notes (Signed)
Procedure Name: Intubation Date/Time: 04/15/2013 11:17 AM Performed by: Elon Alas Pre-anesthesia Checklist: Patient identified, Timeout performed, Emergency Drugs available, Suction available and Patient being monitored Patient Re-evaluated:Patient Re-evaluated prior to inductionOxygen Delivery Method: Circle system utilized Preoxygenation: Pre-oxygenation with 100% oxygen Intubation Type: IV induction Ventilation: Mask ventilation without difficulty Laryngoscope Size: Mac and 4 Grade View: Grade III Tube type: Oral Tube size: 7.5 mm Number of attempts: 1 Airway Equipment and Method: Stylet Placement Confirmation: ETT inserted through vocal cords under direct vision,  positive ETCO2 and breath sounds checked- equal and bilateral Secured at: 24 cm Tube secured with: Tape Dental Injury: Teeth and Oropharynx as per pre-operative assessment

## 2013-04-15 NOTE — H&P (View-Only) (Signed)
Patient ID: Omar [name]., male   DOB: 1946-06-21, 67 y.o.   MRN: [id number]  Chief Complaint  Patient presents with  . New Evaluation    eval ventral hernia    HPI Omar [name]. is a 67 y.o. male.   HPI  He is referred by Dr. Margarita Grizzle for further treatment of an umbilical hernia.  He underwent a robotic assisted prostatectomy in December of 2013. At that time he was discovered to have an umbilical hernia. It is becoming symptomatic and he has intermittent shooting pains from the area. No obstructive symptoms.  He is referred here to discuss repair.  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  Past Medical History  Diagnosis Date  . Depression   . Hyperlipidemia   . GERD (gastroesophageal reflux disease)   . History of colonic polyps   . Decreased white blood cell count   . Carpal tunnel syndrome   . Prostatism   . BPH (benign prostatic hypertrophy)   . ED (erectile dysfunction)   . History of kidney stones   . History of rheumatic fever   . History of esophageal stricture 10/31/2007  . Diverticulosis   . Gastric polyps   . Prostate cancer 11/09/2012    Past Surgical History  Procedure Laterality Date  . Inguinal hernia repair    . Knee arthroscopy      left  . Tonsillectomy    . Carpal tunnel release      Bilateral  . Esophageal dilation      last dilation 8'12  . Robot assisted laparoscopic radical prostatectomy  11/08/2012    Procedure: ROBOTIC ASSISTED LAPAROSCOPIC RADICAL PROSTATECTOMY LEVEL 1;  Surgeon: Milford Cage, MD;  Location: [facility];  Service: Urology;  Laterality: N/A;       Family History  Problem Relation Age of Onset  . Ovarian cancer Mother   . Cancer Mother   . Lung cancer Maternal Uncle   . Lung cancer Maternal Aunt   . Colon cancer Neg Hx   . Esophageal cancer Neg Hx   . Stomach cancer Neg Hx   . Rectal cancer Neg Hx   . Stroke Father     Social History History  Substance Use Topics  . Smoking status: Former Smoker   Types: Cigarettes  . Smokeless tobacco: Former Neurosurgeon    Quit date: 10/30/1994  . Alcohol Use: No    Allergies  Allergen Reactions  . Sulfonamide Derivatives     REACTION: blisters on skin    Current Outpatient Prescriptions  Medication Sig Dispense Refill  . Omeprazole-Sodium Bicarbonate (ZEGERID) 20-1100 MG CAPS Take 1 capsule by mouth daily before breakfast.      . simvastatin (ZOCOR) 80 MG tablet TAKE 1 TABLET BY MOUTH AT BEDTIME  30 tablet  5  . tadalafil (CIALIS) 5 MG tablet Take 5 mg by mouth daily as needed for erectile dysfunction.      . [DISCONTINUED] vardenafil (LEVITRA) 20 MG tablet Take 20 mg by mouth as needed.         No current facility-administered medications for this visit.    Review of Systems Review of Systems  Constitutional: Negative.   Respiratory: Negative.   Cardiovascular: Negative.   Gastrointestinal: Negative for constipation.  Genitourinary: Negative for difficulty urinating.    Blood pressure 138/88, pulse 80, temperature 98 F (36.7 C), temperature source Temporal, height 6' 3.5" (1.918 m), weight 202 lb 6.4 oz (91.808 kg), SpO2 97.00%.  Physical Exam Physical Exam  Constitutional: He appears well-developed and well-nourished. No distress.  HENT:  Head: Normocephalic and atraumatic.  Neck: Neck supple.  Cardiovascular: Normal rate and regular rhythm.   Pulmonary/Chest: Effort normal and breath sounds normal.  Abdominal: Soft. He exhibits no distension. There is no tenderness.  There is a moderate sized umbilical bulge that is reducible. There is a palpable fascial defect. Multiple small scars are noted.  Genitourinary:  No inguinal bulges.  Lymphadenopathy:    He has no cervical adenopathy.    Data Reviewed Dr. Hilario Quarry note.  Assessment    Symptomatic umbilical hernia.     Plan    Umbilical hernia repair with mesh.  I have discussed the procedure, risks, and aftercare.  Risks include but are not limited to bleeding,  infection, wound problems, anesthesia, recurrence, injury to intestine, mesh problems.  We also discussed not knowing what the umbilicus would look like in the future.  He seems to understand and agrees with the plan.        Destiny Trickey J 04/08/2013, 4:53 PM

## 2013-04-15 NOTE — Progress Notes (Signed)
Report given to angel rn as caregiver 

## 2013-04-15 NOTE — Interval H&P Note (Signed)
History and Physical Interval Note:  04/15/2013 10:57 AM  Omar [name].  has presented today for surgery, with the diagnosis of umbilical hernia  The various methods of treatment have been discussed with the patient and family. After consideration of risks, benefits and other options for treatment, the patient has consented to  Procedure(s): HERNIA REPAIR UMBILICAL ADULT (N/A) INSERTION OF MESH (N/A) as a surgical intervention .  The patient's history has been reviewed, patient examined, no change in status, stable for surgery.  I have reviewed the patient's chart and labs.  Questions were answered to the patient's satisfaction.     [name] Shela Commons

## 2013-04-15 NOTE — Preoperative (Signed)
Beta Blockers   Reason not to administer Beta Blockers:Not Applicable 

## 2013-04-16 ENCOUNTER — Telehealth (INDEPENDENT_AMBULATORY_CARE_PROVIDER_SITE_OTHER): Payer: Self-pay | Admitting: General Surgery

## 2013-04-16 ENCOUNTER — Encounter (HOSPITAL_COMMUNITY): Payer: Self-pay | Admitting: General Surgery

## 2013-04-16 NOTE — Telephone Encounter (Signed)
Patient called to see if he could take a shower. I advised to wait 24 hours after the surgery to remove the dressing and shower. He will call with any other questions.

## 2013-05-02 ENCOUNTER — Encounter (INDEPENDENT_AMBULATORY_CARE_PROVIDER_SITE_OTHER): Payer: Self-pay | Admitting: General Surgery

## 2013-05-02 ENCOUNTER — Ambulatory Visit (INDEPENDENT_AMBULATORY_CARE_PROVIDER_SITE_OTHER): Payer: Medicare Other | Admitting: General Surgery

## 2013-05-02 ENCOUNTER — Telehealth: Payer: Self-pay | Admitting: Endocrinology

## 2013-05-02 VITALS — BP 138/76 | HR 76 | Temp 97.7°F | Resp 16 | Ht 75.5 in | Wt 198.4 lb

## 2013-05-02 DIAGNOSIS — Z9889 Other specified postprocedural states: Secondary | ICD-10-CM

## 2013-05-02 NOTE — Progress Notes (Signed)
Procedure:  Umbilical hernia repair with mesh  Date:  04/15/2013  Pathology: na  History:  He is here for his first postoperative visit. Initially he was quite sore but he is much better now. He is able to drive. No problem with urination or bowel movements.  Exam: General- Is in NAD. Abdomen-subumbilical incision is clean and intact with a firm healing ridge.  Assessment:  Doing well postoperatively.  Plan:  Continue light activities for now. May resume normal activities as tolerated 6 weeks after the operation. Return visit as needed.

## 2013-05-02 NOTE — Patient Instructions (Signed)
Continue light activities. May resume normal activities as tolerated 6 weeks after your surgery ( as we discussed ).

## 2013-05-02 NOTE — Telephone Encounter (Signed)
please call patient: "medicare wellness" visit is due 

## 2013-05-06 NOTE — Telephone Encounter (Signed)
Called pt x 1, he was not at home. i will try again later / Sherri

## 2013-05-10 ENCOUNTER — Ambulatory Visit (INDEPENDENT_AMBULATORY_CARE_PROVIDER_SITE_OTHER): Payer: Medicare Other | Admitting: Endocrinology

## 2013-05-10 ENCOUNTER — Encounter: Payer: Self-pay | Admitting: Endocrinology

## 2013-05-10 VITALS — BP 132/80 | HR 78 | Ht 75.0 in | Wt 197.0 lb

## 2013-05-10 DIAGNOSIS — E785 Hyperlipidemia, unspecified: Secondary | ICD-10-CM

## 2013-05-10 DIAGNOSIS — Z79899 Other long term (current) drug therapy: Secondary | ICD-10-CM

## 2013-05-10 DIAGNOSIS — D72819 Decreased white blood cell count, unspecified: Secondary | ICD-10-CM | POA: Diagnosis not present

## 2013-05-10 DIAGNOSIS — C61 Malignant neoplasm of prostate: Secondary | ICD-10-CM | POA: Diagnosis not present

## 2013-05-10 LAB — CBC WITH DIFFERENTIAL/PLATELET
Basophils Absolute: 0 10*3/uL (ref 0.0–0.1)
Basophils Relative: 1 % (ref 0.0–3.0)
Eosinophils Absolute: 0.2 10*3/uL (ref 0.0–0.7)
HCT: 44.3 % (ref 39.0–52.0)
Hemoglobin: 14.7 g/dL (ref 13.0–17.0)
Lymphocytes Relative: 38.4 % (ref 12.0–46.0)
Lymphs Abs: 1.4 10*3/uL (ref 0.7–4.0)
MCHC: 33.2 g/dL (ref 30.0–36.0)
MCV: 82.5 fl (ref 78.0–100.0)
Monocytes Absolute: 0.3 10*3/uL (ref 0.1–1.0)
Neutro Abs: 1.6 10*3/uL (ref 1.4–7.7)
RDW: 13.4 % (ref 11.5–14.6)

## 2013-05-10 LAB — BASIC METABOLIC PANEL
CO2: 27 mEq/L (ref 19–32)
Chloride: 103 mEq/L (ref 96–112)
GFR: 72.68 mL/min (ref >60.00)
Glucose, Bld: 73 mg/dL (ref 70–99)
Potassium: 3.9 mEq/L (ref 3.5–5.1)
Sodium: 140 mEq/L (ref 135–145)

## 2013-05-10 LAB — HEPATIC FUNCTION PANEL
AST: 13 U/L (ref 0–37)
Albumin: 3.9 g/dL (ref 3.5–5.2)

## 2013-05-10 LAB — LIPID PANEL: Total CHOL/HDL Ratio: 4

## 2013-05-10 LAB — TSH: TSH: 0.92 u[IU]/mL (ref 0.35–5.50)

## 2013-05-10 MED ORDER — TADALAFIL 5 MG PO TABS: 5.0000 mg | ORAL_TABLET | Freq: Every day | ORAL | Status: DC | PRN

## 2013-05-10 MED ORDER — SIMVASTATIN 80 MG PO TABS: 80.0000 mg | ORAL_TABLET | Freq: Every day | ORAL | Status: DC

## 2013-05-10 NOTE — Progress Notes (Signed)
Subjective:    Patient ID: Omar [name]., male    DOB: June 11, 1946, 67 y.o.   MRN: [id number]  HPI The state of at least three ongoing medical problems is addressed today, with interval history of each noted here: Dyslipidemia: he has lost a few lbs, due to his efforts. Leukopenia: he denies fever Depression is much better recently. Past Medical History  Diagnosis Date  . Hyperlipidemia   . GERD (gastroesophageal reflux disease)   . History of colonic polyps   . Decreased white blood cell count   . Prostatism   . BPH (benign prostatic hypertrophy)   . History of kidney stones   . History of rheumatic fever   . History of esophageal stricture 10/31/2007  . Diverticulosis   . Gastric polyps   . Prostate cancer 11/09/2012  . Depression   . Erectile dysfunction     Past Surgical History  Procedure Laterality Date  . Inguinal hernia repair    . Knee arthroscopy      left  . Tonsillectomy    . Carpal tunnel release      Bilateral  . Esophageal dilation      last dilation 8'12  . Robot assisted laparoscopic radical prostatectomy  11/08/2012    Procedure: ROBOTIC ASSISTED LAPAROSCOPIC RADICAL PROSTATECTOMY LEVEL 1;  Surgeon: Milford Cage, MD;  Location: [facility];  Service: Urology;  Laterality: N/A;     . Umbilical hernia repair N/A 04/15/2013    Procedure: HERNIA REPAIR UMBILICAL ADULT;  Surgeon: Adolph Pollack, MD;  Location: [facility] OR;  Service: General;  Laterality: N/A;  . Insertion of mesh N/A 04/15/2013    Procedure: INSERTION OF MESH;  Surgeon: Adolph Pollack, MD;  Location: [facility] OR;  Service: General;  Laterality: N/A;    History   Social History  . Marital Status: Married    Spouse Name: N/A    Number of Children: 4  . Years of Education: N/A   Occupational History  . pastor    Social History Main Topics  . Smoking status: Former Smoker    Types: Cigarettes  . Smokeless tobacco: Former Neurosurgeon    Quit date: 10/30/1994  . Alcohol Use: No  . Drug Use:  No  . Sexually Active: Yes   Other Topics Concern  . Not on file   Social History Narrative  . No narrative on file    Current Outpatient Prescriptions on File Prior to Visit  Medication Sig Dispense Refill  . Multiple Vitamins-Minerals (ZINC PO) Take 1 tablet by mouth 2 (two) times daily.      Maxwell Caul Bicarbonate (ZEGERID) 20-1100 MG CAPS Take 1 capsule by mouth daily before breakfast.      . [DISCONTINUED] vardenafil (LEVITRA) 20 MG tablet Take 20 mg by mouth as needed.         No current facility-administered medications on file prior to visit.    Allergies  Allergen Reactions  . Sulfonamide Derivatives     REACTION: blisters on skin    Family History  Problem Relation Age of Onset  . Ovarian cancer Mother   . Cancer Mother   . Lung cancer Maternal Uncle   . Lung cancer Maternal Aunt   . Colon cancer Neg Hx   . Esophageal cancer Neg Hx   . Stomach cancer Neg Hx   . Rectal cancer Neg Hx   . Stroke Father     BP 132/80  Pulse 78  Ht 6\' 3"  (1.905 m)  Wt 197 lb (89.359 kg)  BMI 24.62 kg/m2  SpO2 95%  Review of Systems Denies chest pain and dizziness.    Objective:   Physical Exam VITAL SIGNS:  See vs page GENERAL: no distress NECK: There is no palpable thyroid enlargement.  No thyroid nodule is palpable.  No palpable lymphadenopathy at the anterior neck. LUNGS:  Clear to auscultation HEART:  Regular rate and rhythm without murmurs noted. Normal S1,S2.    Lab Results  Component Value Date   WBC 3.5* 05/10/2013   HGB 14.7 05/10/2013   HCT 44.3 05/10/2013   PLT 187.0 05/10/2013   GLUCOSE 73 05/10/2013   CHOL 159 05/10/2013   TRIG 106.0 05/10/2013   HDL 43.10 05/10/2013   LDLCALC 95 05/10/2013   ALT 13 05/10/2013   AST 13 05/10/2013   NA 140 05/10/2013   K 3.9 05/10/2013   CL 103 05/10/2013   CREATININE 1.3 05/10/2013   BUN 15 05/10/2013   CO2 27 05/10/2013   TSH 0.92 05/10/2013   PSA 5.59* 04/02/2012   INR 0.93 04/11/2013      Assessment & Plan:  Depression: better  recently Dyslipidemia: well-controlled Leukopenia: stable

## 2013-05-10 NOTE — Patient Instructions (Addendum)
blood tests are being requested for you today.  We'll contact you with results. Please come is soon for a "medicare wellness" visit.

## 2013-05-17 ENCOUNTER — Encounter: Payer: Medicare Other | Admitting: Endocrinology

## 2013-05-21 ENCOUNTER — Telehealth: Payer: Self-pay | Admitting: Endocrinology

## 2013-05-21 MED ORDER — TADALAFIL 5 MG PO TABS: 5.0000 mg | ORAL_TABLET | Freq: Every day | ORAL | Status: DC | PRN

## 2013-05-21 NOTE — Telephone Encounter (Signed)
Patient states that Cialis refill was not supposed to go to [facility]. Please send Cialis refill to [facility]. Fax # [phone]

## 2013-05-21 NOTE — Telephone Encounter (Signed)
Rx for Cialis sent to the wrong pharmacy. Need to send a written rx to fax# [phone].

## 2013-05-22 ENCOUNTER — Ambulatory Visit (INDEPENDENT_AMBULATORY_CARE_PROVIDER_SITE_OTHER): Payer: Medicare Other | Admitting: Endocrinology

## 2013-05-22 ENCOUNTER — Encounter: Payer: Self-pay | Admitting: Endocrinology

## 2013-05-22 VITALS — BP 134/78 | HR 68 | Ht 74.0 in | Wt 199.0 lb

## 2013-05-22 DIAGNOSIS — C61 Malignant neoplasm of prostate: Secondary | ICD-10-CM | POA: Diagnosis not present

## 2013-05-22 DIAGNOSIS — Z0000 Encounter for general adult medical examination without abnormal findings: Secondary | ICD-10-CM | POA: Diagnosis not present

## 2013-05-22 DIAGNOSIS — K429 Umbilical hernia without obstruction or gangrene: Secondary | ICD-10-CM

## 2013-05-22 NOTE — Patient Instructions (Addendum)
good diet and exercise habits significanly improve your health.  please let me know if you wish to be referred to a dietician.   please consider these measures for your health:  minimize alcohol.  do not use tobacco products.  keep firearms safely stored.  always use seat belts.  have working smoke alarms in your home.  see an eye doctor and dentist regularly.  never drive under the influence of alcohol or drugs (including prescription drugs).   it is critically important to prevent falling down (keep floor areas well-lit, dry, and free of loose objects.  If you have a cane, walker, or wheelchair, you should use it, even for short trips around the house.  Also, try not to rush). Please return in 1 year.

## 2013-05-22 NOTE — Progress Notes (Signed)
Subjective:    Patient ID: Omar Beecham., male    DOB: March 16, 1946, 67 y.o.   MRN: [id number]  HPI Pt reports mild work-related depression.  He says he does not need medication.   Past Medical History  Diagnosis Date  . Hyperlipidemia   . GERD (gastroesophageal reflux disease)   . History of colonic polyps   . Decreased white blood cell count   . Prostatism   . BPH (benign prostatic hypertrophy)   . History of kidney stones   . History of rheumatic fever   . History of esophageal stricture 10/31/2007  . Diverticulosis   . Gastric polyps   . Prostate cancer 11/09/2012  . Depression   . Erectile dysfunction     Past Surgical History  Procedure Laterality Date  . Inguinal hernia repair    . Knee arthroscopy      left  . Tonsillectomy    . Carpal tunnel release      Bilateral  . Esophageal dilation      last dilation 8'12  . Robot assisted laparoscopic radical prostatectomy  11/08/2012    Procedure: ROBOTIC ASSISTED LAPAROSCOPIC RADICAL PROSTATECTOMY LEVEL 1;  Surgeon: Milford Cage, MD;  Location: [facility];  Service: Urology;  Laterality: N/A;     . Umbilical hernia repair N/A 04/15/2013    Procedure: HERNIA REPAIR UMBILICAL ADULT;  Surgeon: Adolph Pollack, MD;  Location: [facility] OR;  Service: General;  Laterality: N/A;  . Insertion of mesh N/A 04/15/2013    Procedure: INSERTION OF MESH;  Surgeon: Adolph Pollack, MD;  Location: [facility] OR;  Service: General;  Laterality: N/A;    History   Social History  . Marital Status: Married    Spouse Name: N/A    Number of Children: 4  . Years of Education: N/A   Occupational History  . pastor    Social History Main Topics  . Smoking status: Former Smoker    Types: Cigarettes  . Smokeless tobacco: Former Neurosurgeon    Quit date: 10/30/1994  . Alcohol Use: No  . Drug Use: No  . Sexually Active: Yes   Other Topics Concern  . Not on file   Social History Narrative  . No narrative on file    Current Outpatient  Prescriptions on File Prior to Visit  Medication Sig Dispense Refill  . Multiple Vitamins-Minerals (ZINC PO) Take 1 tablet by mouth 2 (two) times daily.      Maxwell Caul Bicarbonate (ZEGERID) 20-1100 MG CAPS Take 1 capsule by mouth daily before breakfast.      . simvastatin (ZOCOR) 80 MG tablet Take 1 tablet (80 mg total) by mouth at bedtime.  30 tablet  11  . [DISCONTINUED] vardenafil (LEVITRA) 20 MG tablet Take 20 mg by mouth as needed.         No current facility-administered medications on file prior to visit.    Allergies  Allergen Reactions  . Sulfonamide Derivatives     REACTION: blisters on skin    Family History  Problem Relation Age of Onset  . Ovarian cancer Mother   . Cancer Mother   . Lung cancer Maternal Uncle   . Lung cancer Maternal Aunt   . Colon cancer Neg Hx   . Esophageal cancer Neg Hx   . Stomach cancer Neg Hx   . Rectal cancer Neg Hx   . Stroke Father     BP 134/78  Pulse 68  Ht 6\' 2"  (1.88 m)  Wt  199 lb (90.266 kg)  BMI 25.54 kg/m2  SpO2 98%  Review of Systems Denies weight change    Objective:   Physical Exam VITAL SIGNS:  See vs page GENERAL: no distress PSYCH: Alert and oriented x 3.  Does not appear anxious nor depressed.    i reviewed electrocardiogram: no change since 2013    Assessment & Plan:  Depression, mild, no rx needed.   Subjective:   Patient here for Medicare annual wellness visit and management of other chronic and acute problems.     Risk factors: advanced age    Roster of Physicians Providing Medical Care to Patient:  See "snapshot"   Activities of Daily Living: In your present state of health, do you have any difficulty performing the following activities?:  Preparing food and eating?: No  Bathing yourself: No  Getting dressed: No  Using the toilet:No  Moving around from place to place: No  In the past year have you fallen or had a near fall?:No    Home Safety: Has smoke detector and wears seat  belts. Firearms are safely stored.   Diet and Exercise  Current exercise habits: pt says good. Dietary issues discussed: pt reports a healthy diet   Depression Screen  Q1: Over the past two weeks, have you felt down, depressed or hopeless?no  Q2: Over the past two weeks, have you felt little interest or pleasure in doing things? no   The following portions of the patient's history were reviewed and updated as appropriate: allergies, current medications, past family history, past medical history, past social history, past surgical history and problem list.  Past Medical History  Diagnosis Date  . Hyperlipidemia   . GERD (gastroesophageal reflux disease)   . History of colonic polyps   . Decreased white blood cell count   . Prostatism   . BPH (benign prostatic hypertrophy)   . History of kidney stones   . History of rheumatic fever   . History of esophageal stricture 10/31/2007  . Diverticulosis   . Gastric polyps   . Prostate cancer 11/09/2012  . Depression   . Erectile dysfunction     Past Surgical History  Procedure Laterality Date  . Inguinal hernia repair    . Knee arthroscopy      left  . Tonsillectomy    . Carpal tunnel release      Bilateral  . Esophageal dilation      last dilation 8'12  . Robot assisted laparoscopic radical prostatectomy  11/08/2012    Procedure: ROBOTIC ASSISTED LAPAROSCOPIC RADICAL PROSTATECTOMY LEVEL 1;  Surgeon: Milford Cage, MD;  Location: [facility];  Service: Urology;  Laterality: N/A;     . Umbilical hernia repair N/A 04/15/2013    Procedure: HERNIA REPAIR UMBILICAL ADULT;  Surgeon: Adolph Pollack, MD;  Location: [facility] OR;  Service: General;  Laterality: N/A;  . Insertion of mesh N/A 04/15/2013    Procedure: INSERTION OF MESH;  Surgeon: Adolph Pollack, MD;  Location: [facility] OR;  Service: General;  Laterality: N/A;    History   Social History  . Marital Status: Married    Spouse Name: N/A    Number of Children: 4  . Years of  Education: N/A   Occupational History  . pastor    Social History Main Topics  . Smoking status: Former Smoker    Types: Cigarettes  . Smokeless tobacco: Former Neurosurgeon    Quit date: 10/30/1994  . Alcohol Use: No  . Drug Use:  No  . Sexually Active: Yes   Other Topics Concern  . Not on file   Social History Narrative  . No narrative on file    Current Outpatient Prescriptions on File Prior to Visit  Medication Sig Dispense Refill  . Multiple Vitamins-Minerals (ZINC PO) Take 1 tablet by mouth 2 (two) times daily.      Maxwell Caul Bicarbonate (ZEGERID) 20-1100 MG CAPS Take 1 capsule by mouth daily before breakfast.      . simvastatin (ZOCOR) 80 MG tablet Take 1 tablet (80 mg total) by mouth at bedtime.  30 tablet  11  . tadalafil (CIALIS) 5 MG tablet Take 1 tablet (5 mg total) by mouth daily as needed for erectile dysfunction.  30 tablet  11  . [DISCONTINUED] vardenafil (LEVITRA) 20 MG tablet Take 20 mg by mouth as needed.         No current facility-administered medications on file prior to visit.    Allergies  Allergen Reactions  . Sulfonamide Derivatives     REACTION: blisters on skin    Family History  Problem Relation Age of Onset  . Ovarian cancer Mother   . Cancer Mother   . Lung cancer Maternal Uncle   . Lung cancer Maternal Aunt   . Colon cancer Neg Hx   . Esophageal cancer Neg Hx   . Stomach cancer Neg Hx   . Rectal cancer Neg Hx   . Stroke Father     BP 134/78  Pulse 68  Ht 6\' 2"  (1.88 m)  Wt 199 lb (90.266 kg)  BMI 25.54 kg/m2  SpO2 98%   Review of Systems  Denies hearing loss, and visual loss Objective:   Vision:  Sees opthalmologist Hearing: grossly normal Body mass index:  See vs page Msk: pt easily and quickly performs "get-up-and-go" from a sitting position Cognitive Impairment Assessment: cognition, memory and judgment appear normal.  remembers 3/3 at 5 minutes.  excellent recall.  can easily read and write a sentence.  alert and  oriented x 3   Assessment:   Medicare wellness utd on preventive parameters    Plan:   During the course of the visit the patient was educated and counseled about appropriate screening and preventive services including:        Fall prevention    Diabetes screening: see recent labs  Nutrition counseling   Vaccines / LABS Zostavax / Pnemonccoal Vaccine  today  PSA: sees urology  Patient Instructions (the written plan) was given to the patient.   we discussed code status.  pt requests full code, but would not want to be started or maintained on artificial life-support measures if there was not a reasonable chance of recovery

## 2013-05-23 MED ORDER — TADALAFIL 20 MG PO TABS: 20.0000 mg | ORAL_TABLET | Freq: Every day | ORAL | Status: DC | PRN

## 2013-05-28 ENCOUNTER — Other Ambulatory Visit: Payer: Self-pay | Admitting: *Deleted

## 2013-05-28 MED ORDER — TADALAFIL 20 MG PO TABS: 20.0000 mg | ORAL_TABLET | Freq: Every day | ORAL | Status: DC | PRN

## 2013-05-28 NOTE — Telephone Encounter (Signed)
Pt called stating his rx was sent to the wrong pharmacy. Pt wants it sent to [facility] # [phone]. Re-doing to be faxed for pt.

## 2013-08-02 DIAGNOSIS — C61 Malignant neoplasm of prostate: Secondary | ICD-10-CM | POA: Diagnosis not present

## 2013-08-09 DIAGNOSIS — N393 Stress incontinence (female) (male): Secondary | ICD-10-CM | POA: Diagnosis not present

## 2013-08-09 DIAGNOSIS — C61 Malignant neoplasm of prostate: Secondary | ICD-10-CM | POA: Diagnosis not present

## 2013-08-09 DIAGNOSIS — N529 Male erectile dysfunction, unspecified: Secondary | ICD-10-CM | POA: Diagnosis not present

## 2013-08-27 DIAGNOSIS — Z23 Encounter for immunization: Secondary | ICD-10-CM | POA: Diagnosis not present

## 2013-09-06 ENCOUNTER — Encounter: Payer: Self-pay | Admitting: Endocrinology

## 2013-09-06 ENCOUNTER — Ambulatory Visit (INDEPENDENT_AMBULATORY_CARE_PROVIDER_SITE_OTHER): Payer: Medicare Other | Admitting: Endocrinology

## 2013-09-06 VITALS — BP 128/72 | HR 84 | Wt 203.0 lb

## 2013-09-06 DIAGNOSIS — N529 Male erectile dysfunction, unspecified: Secondary | ICD-10-CM

## 2013-09-06 NOTE — Progress Notes (Signed)
Subjective:    Patient ID: Omar [name]., male    DOB: 21-Jan-1946, 67 y.o.   MRN: [id number]  HPI Pt reports 1 year of slight pain at the genital area, and assoc ED sxs.  He gets incomplete relief from cialis.  Pt says this started when he had surgery for prostate cancer. He has healed from the surgery.  Past Medical History  Diagnosis Date  . Hyperlipidemia   . GERD (gastroesophageal reflux disease)   . History of colonic polyps   . Decreased white blood cell count   . Prostatism   . BPH (benign prostatic hypertrophy)   . History of kidney stones   . History of rheumatic fever   . History of esophageal stricture 10/31/2007  . Diverticulosis   . Gastric polyps   . Prostate cancer 11/09/2012  . Depression   . Erectile dysfunction     Past Surgical History  Procedure Laterality Date  . Inguinal hernia repair    . Knee arthroscopy      left  . Tonsillectomy    . Carpal tunnel release      Bilateral  . Esophageal dilation      last dilation 8'12  . Robot assisted laparoscopic radical prostatectomy  11/08/2012    Procedure: ROBOTIC ASSISTED LAPAROSCOPIC RADICAL PROSTATECTOMY LEVEL 1;  Surgeon: Milford Cage, MD;  Location: [facility];  Service: Urology;  Laterality: N/A;     . Umbilical hernia repair N/A 04/15/2013    Procedure: HERNIA REPAIR UMBILICAL ADULT;  Surgeon: Adolph Pollack, MD;  Location: [facility] OR;  Service: General;  Laterality: N/A;  . Insertion of mesh N/A 04/15/2013    Procedure: INSERTION OF MESH;  Surgeon: Adolph Pollack, MD;  Location: [facility] OR;  Service: General;  Laterality: N/A;    History   Social History  . Marital Status: Married    Spouse Name: N/A    Number of Children: 4  . Years of Education: N/A   Occupational History  . pastor    Social History Main Topics  . Smoking status: Former Smoker    Types: Cigarettes  . Smokeless tobacco: Former Neurosurgeon    Quit date: 10/30/1994  . Alcohol Use: No  . Drug Use: No  . Sexual Activity: Yes    Other Topics Concern  . Not on file   Social History Narrative  . No narrative on file    Current Outpatient Prescriptions on File Prior to Visit  Medication Sig Dispense Refill  . Multiple Vitamins-Minerals (ZINC PO) Take 1 tablet by mouth 2 (two) times daily.      Maxwell Caul Bicarbonate (ZEGERID) 20-1100 MG CAPS Take 1 capsule by mouth daily before breakfast.      . simvastatin (ZOCOR) 80 MG tablet Take 1 tablet (80 mg total) by mouth at bedtime.  30 tablet  11  . [DISCONTINUED] vardenafil (LEVITRA) 20 MG tablet Take 20 mg by mouth as needed.         No current facility-administered medications on file prior to visit.    Allergies  Allergen Reactions  . Sulfonamide Derivatives     REACTION: blisters on skin    Family History  Problem Relation Age of Onset  . Ovarian cancer Mother   . Cancer Mother   . Lung cancer Maternal Uncle   . Lung cancer Maternal Aunt   . Colon cancer Neg Hx   . Esophageal cancer Neg Hx   . Stomach cancer Neg Hx   .  Rectal cancer Neg Hx   . Stroke Father     BP 128/72  Pulse 84  Wt 203 lb (92.08 kg)  BMI 26.05 kg/m2  SpO2 97%  Review of Systems Denies dysuria and hematuria.    Objective:   Physical Exam VITAL SIGNS:  See vs page GENERAL: no distress GENITALIA:  Normal male testicles, scrotum, and penis     Assessment & Plan:  ED sxs, persistent

## 2013-09-06 NOTE — Patient Instructions (Addendum)
Here are some samples of "stendra," 100 mg.  Take 1 or 2 as needed for ED symptoms. If this does not help, please ask your urologist what else might be done for this.

## 2013-09-07 DIAGNOSIS — N529 Male erectile dysfunction, unspecified: Secondary | ICD-10-CM | POA: Insufficient documentation

## 2013-11-04 DIAGNOSIS — C61 Malignant neoplasm of prostate: Secondary | ICD-10-CM | POA: Diagnosis not present

## 2013-11-07 ENCOUNTER — Emergency Department (HOSPITAL_COMMUNITY): Payer: Medicare Other

## 2013-11-07 ENCOUNTER — Encounter (HOSPITAL_COMMUNITY): Payer: Self-pay | Admitting: Emergency Medicine

## 2013-11-07 ENCOUNTER — Emergency Department (HOSPITAL_COMMUNITY)
Admission: EM | Admit: 2013-11-07 | Discharge: 2013-11-07 | Disposition: A | Discharge status: Home / Self Care | Payer: Medicare Other | Attending: Emergency Medicine | Admitting: Emergency Medicine

## 2013-11-07 DIAGNOSIS — Z8701 Personal history of pneumonia (recurrent): Secondary | ICD-10-CM | POA: Diagnosis not present

## 2013-11-07 DIAGNOSIS — N529 Male erectile dysfunction, unspecified: Secondary | ICD-10-CM | POA: Diagnosis not present

## 2013-11-07 DIAGNOSIS — K219 Gastro-esophageal reflux disease without esophagitis: Secondary | ICD-10-CM | POA: Insufficient documentation

## 2013-11-07 DIAGNOSIS — Z8546 Personal history of malignant neoplasm of prostate: Secondary | ICD-10-CM | POA: Insufficient documentation

## 2013-11-07 DIAGNOSIS — J90 Pleural effusion, not elsewhere classified: Secondary | ICD-10-CM | POA: Diagnosis not present

## 2013-11-07 DIAGNOSIS — Z8659 Personal history of other mental and behavioral disorders: Secondary | ICD-10-CM | POA: Diagnosis not present

## 2013-11-07 DIAGNOSIS — Z860100 Personal history of colon polyps, unspecified: Secondary | ICD-10-CM | POA: Insufficient documentation

## 2013-11-07 DIAGNOSIS — Z862 Personal history of diseases of the blood and blood-forming organs and certain disorders involving the immune mechanism: Secondary | ICD-10-CM | POA: Insufficient documentation

## 2013-11-07 DIAGNOSIS — R0781 Pleurodynia: Secondary | ICD-10-CM

## 2013-11-07 DIAGNOSIS — Z87891 Personal history of nicotine dependence: Secondary | ICD-10-CM | POA: Insufficient documentation

## 2013-11-07 DIAGNOSIS — R071 Chest pain on breathing: Secondary | ICD-10-CM | POA: Diagnosis not present

## 2013-11-07 DIAGNOSIS — Z79899 Other long term (current) drug therapy: Secondary | ICD-10-CM | POA: Diagnosis not present

## 2013-11-07 DIAGNOSIS — Z87442 Personal history of urinary calculi: Secondary | ICD-10-CM | POA: Diagnosis not present

## 2013-11-07 DIAGNOSIS — Z8679 Personal history of other diseases of the circulatory system: Secondary | ICD-10-CM | POA: Insufficient documentation

## 2013-11-07 DIAGNOSIS — E785 Hyperlipidemia, unspecified: Secondary | ICD-10-CM | POA: Diagnosis not present

## 2013-11-07 LAB — D-DIMER, QUANTITATIVE: D-Dimer, Quant: 0.47 ug/mL-FEU (ref 0.00–0.48)

## 2013-11-07 LAB — TROPONIN I: Troponin I: <0.3 ng/mL (ref <0.30)

## 2013-11-07 MED ORDER — ACETAMINOPHEN 325 MG PO TABS
650.0000 mg | ORAL_TABLET | Freq: Once | ORAL | Status: AC
  Administered 2013-11-07: 650 mg via ORAL
  Filled 2013-11-07: qty 2

## 2013-11-07 MED ORDER — AZITHROMYCIN 250 MG PO TABS: ORAL_TABLET | ORAL | Status: DC

## 2013-11-07 MED ORDER — KETOROLAC TROMETHAMINE 60 MG/2ML IM SOLN
60.0000 mg | Freq: Once | INTRAMUSCULAR | Status: AC
  Administered 2013-11-07: 60 mg via INTRAMUSCULAR
  Filled 2013-11-07: qty 2

## 2013-11-07 MED ORDER — TRAMADOL HCL 50 MG PO TABS
100.0000 mg | ORAL_TABLET | Freq: Once | ORAL | Status: AC
  Administered 2013-11-07: 100 mg via ORAL
  Filled 2013-11-07: qty 2

## 2013-11-07 MED ORDER — TRAMADOL HCL 50 MG PO TABS: 100.0000 mg | ORAL_TABLET | Freq: Four times a day (QID) | ORAL | Status: DC | PRN

## 2013-11-07 NOTE — ED Notes (Signed)
Pt c/o right sided rib pain since yesterday. States the pain has gotten progressively worse and "comes and goes, like a pulse". Denies dizziness, N/V, SOB, or increase in pain with deep breaths. Pt ambulated from triage with no distress.

## 2013-11-07 NOTE — ED Provider Notes (Signed)
CSN: [id number]     Arrival date & time 11/07/13  0112 History   First MD Initiated Contact with Patient 11/07/13 0139     Chief Complaint  Patient presents with  . Chest Pain   (Consider location/radiation/quality/duration/timing/severity/associated sxs/prior Treatment) HPI  Patient reports he started getting a intermittent sharp pain in his right anterior lower chest on 12 /2. He denies any change in activity or injury. He denies any cough or fever. He describes the pain as a sharp jab lasting for a second. He denies that anything makes it worse however during his exam he was noticeably painful when he took a deep breath. He states nothing makes it feel better. He denies having shortness of breath, pain or swelling in his legs. He denies recent travel. He denies rhinorrhea, sore throat, or rash. He states he had something similar to this when he had pneumonia years ago.  PCP Dr Kearney Hard  Past Medical History  Diagnosis Date  . Hyperlipidemia   . GERD (gastroesophageal reflux disease)   . History of colonic polyps   . Decreased white blood cell count   . Prostatism   . BPH (benign prostatic hypertrophy)   . History of kidney stones   . History of rheumatic fever   . History of esophageal stricture 10/31/2007  . Diverticulosis   . Gastric polyps   . Prostate cancer 11/09/2012  . Depression   . Erectile dysfunction    Past Surgical History  Procedure Laterality Date  . Inguinal hernia repair    . Knee arthroscopy      left  . Tonsillectomy    . Carpal tunnel release      Bilateral  . Esophageal dilation      last dilation 8'12  . Robot assisted laparoscopic radical prostatectomy  11/08/2012    Procedure: ROBOTIC ASSISTED LAPAROSCOPIC RADICAL PROSTATECTOMY LEVEL 1;  Surgeon: Milford Cage, MD;  Location: [facility];  Service: Urology;  Laterality: N/A;     . Umbilical hernia repair N/A 04/15/2013    Procedure: HERNIA REPAIR UMBILICAL ADULT;  Surgeon: Adolph Pollack,  MD;  Location: [facility];  Service: General;  Laterality: N/A;  . Insertion of mesh N/A 04/15/2013    Procedure: INSERTION OF MESH;  Surgeon: Adolph Pollack, MD;  Location: [facility] OR;  Service: General;  Laterality: N/A;   Family History  Problem Relation Age of Onset  . Ovarian cancer Mother   . Cancer Mother   . Lung cancer Maternal Uncle   . Lung cancer Maternal Aunt   . Colon cancer Neg Hx   . Esophageal cancer Neg Hx   . Stomach cancer Neg Hx   . Rectal cancer Neg Hx   . Stroke Father    History  Substance Use Topics  . Smoking status: Former Smoker    Types: Cigarettes  . Smokeless tobacco: Former Neurosurgeon    Quit date: 10/30/1994  . Alcohol Use: No  Pt is a preacher  Review of Systems  All other systems reviewed and are negative.    Allergies  Sulfonamide derivatives  Home Medications   Current Outpatient Rx  Name  Route  Sig  Dispense  Refill  . esomeprazole (NEXIUM) 40 MG capsule   Oral   Take 40 mg by mouth daily at 12 noon.         . Multiple Vitamins-Minerals (ZINC PO)   Oral   Take 1 tablet by mouth 2 (two) times daily.         Marland Kitchen  simvastatin (ZOCOR) 80 MG tablet   Oral   Take 1 tablet (80 mg total) by mouth at bedtime.   30 tablet   11   . Avanafil (STENDRA) 100 MG TABS   Oral   Take by mouth. 1-2 pills as needed for ED symptoms         . Omeprazole-Sodium Bicarbonate (ZEGERID) 20-1100 MG CAPS   Oral   Take 1 capsule by mouth daily before breakfast.          BP 171/97  Pulse 77  Temp(Src) 97.8 F (36.6 C) (Oral)  Resp 20  Ht 6' 3.5" (1.918 m)  Wt 203 lb (92.08 kg)  BMI 25.03 kg/m2  SpO2 99% Physical Exam  Nursing note and vitals reviewed. Constitutional: He is oriented to person, place, and time. He appears well-developed and well-nourished.  Non-toxic appearance. He does not appear ill. No distress.  HENT:  Head: Normocephalic and atraumatic.  Right Ear: External ear normal.  Left Ear: External ear normal.  Nose: Nose normal. No  mucosal edema or rhinorrhea.  Mouth/Throat: Oropharynx is clear and moist and mucous membranes are normal. No dental abscesses or uvula swelling.  Eyes: Conjunctivae and EOM are normal. Pupils are equal, round, and reactive to light.  Neck: Normal range of motion and full passive range of motion without pain. Neck supple.  Cardiovascular: Normal rate, regular rhythm and normal heart sounds.  Exam reveals no gallop and no friction rub.   No murmur heard. Pulmonary/Chest: Effort normal and breath sounds normal. No respiratory distress. He has no wheezes. He has no rhonchi. He has no rales. He exhibits no tenderness and no crepitus.    Area of pain noted, nontender  Abdominal: Soft. Normal appearance and bowel sounds are normal. He exhibits no distension. There is no tenderness. There is no rebound and no guarding.  Musculoskeletal: Normal range of motion. He exhibits no edema and no tenderness.  Moves all extremities well.   Neurological: He is alert and oriented to person, place, and time. He has normal strength. No cranial nerve deficit.  Skin: Skin is warm, dry and intact. No rash noted. No erythema. No pallor.  Psychiatric: He has a normal mood and affect. His speech is normal and behavior is normal. His mood appears not anxious.    ED Course  Procedures (including critical care time)  Medications  ketorolac (TORADOL) injection 60 mg (60 mg Intramuscular Given 11/07/13 0234)  traMADol (ULTRAM) tablet 100 mg (100 mg Oral Given 11/07/13 0334)  acetaminophen (TYLENOL) tablet 650 mg (650 mg Oral Given 11/07/13 0334)    Pt states minimal relief with toradol.   Labs Review Results for orders placed during the hospital encounter of 11/07/13  TROPONIN I      Result Value Range   Troponin I <0.30  <0.30 ng/mL  D-DIMER, QUANTITATIVE      Result Value Range   D-Dimer, Quant 0.47  0.00 - 0.48 ug/mL-FEU   Laboratory interpretation all normal   Imaging Review Dg Chest 2 View  11/07/2013    CLINICAL DATA:  Right-sided chest pain  EXAM: CHEST  2 VIEW  COMPARISON:  10/30/2012  FINDINGS: Cardiomediastinal contours are similar to prior. Eventration right hemidiaphragm. Increased right greater than left lower lobe opacities. Suspect small effusions. No pneumothorax. No acute osseous finding.  IMPRESSION: Interval development of small effusions and bibasilar opacities, right greater than left ; atelectasis versus pneumonia.   Electronically Signed   By: Jearld Lesch M.D.   On:  11/07/2013 02:59    EKG Interpretation    Date/Time:  Thursday November 07 2013 01:40:17 EST Ventricular Rate:  76 PR Interval:  174 QRS Duration: 78 QT Interval:  376 QTC Calculation: 423 R Axis:   10 Text Interpretation:  Normal sinus rhythm Normal ECG No previous ECGs available Confirmed by Veatrice Eckstein  MD-I, Jakhari Space ([id number]) on 11/07/2013 2:56:49 AM            MDM    This patient presents with pleuritic chest pain without fever, cough, or SOB. He has no rash and is atypical for shingles. Atypical for CAD, pneumonia. His CXR may be explained by not breathing deep b/o pain. Pt will be treated for pleurisy and put on antibiotics for possible infection.    1. Pleuritic chest pain     Discharge Medication List as of 11/07/2013  3:29 AM    START taking these medications   Details  azithromycin (ZITHROMAX) 250 MG tablet Take 2 po the first day then once a day for the next 4 days., Print    traMADol (ULTRAM) 50 MG tablet Take 2 tablets (100 mg total) by mouth every 6 (six) hours as needed for moderate pain., Starting 11/07/2013, Until Discontinued, Print        Plan discharge  Devoria Albe, MD, Franz Dell, MD 11/07/13 [id number]

## 2013-11-11 DIAGNOSIS — Z8546 Personal history of malignant neoplasm of prostate: Secondary | ICD-10-CM | POA: Diagnosis not present

## 2013-11-11 DIAGNOSIS — N529 Male erectile dysfunction, unspecified: Secondary | ICD-10-CM | POA: Diagnosis not present

## 2013-11-11 DIAGNOSIS — N393 Stress incontinence (female) (male): Secondary | ICD-10-CM | POA: Diagnosis not present

## 2013-11-12 ENCOUNTER — Ambulatory Visit
Admission: RE | Admit: 2013-11-12 | Discharge: 2013-11-12 | Disposition: A | Discharge status: Home / Self Care | Payer: Medicare Other | Source: Ambulatory Visit | Attending: Endocrinology | Admitting: Endocrinology

## 2013-11-12 ENCOUNTER — Encounter: Payer: Self-pay | Admitting: Endocrinology

## 2013-11-12 ENCOUNTER — Ambulatory Visit (INDEPENDENT_AMBULATORY_CARE_PROVIDER_SITE_OTHER): Payer: Medicare Other | Admitting: Endocrinology

## 2013-11-12 VITALS — BP 118/80 | HR 64 | Temp 98.0°F | Ht 75.5 in | Wt 198.0 lb

## 2013-11-12 DIAGNOSIS — R0789 Other chest pain: Secondary | ICD-10-CM

## 2013-11-12 DIAGNOSIS — Z79899 Other long term (current) drug therapy: Secondary | ICD-10-CM | POA: Diagnosis not present

## 2013-11-12 DIAGNOSIS — R0602 Shortness of breath: Secondary | ICD-10-CM | POA: Diagnosis not present

## 2013-11-12 DIAGNOSIS — R071 Chest pain on breathing: Secondary | ICD-10-CM

## 2013-11-12 DIAGNOSIS — R079 Chest pain, unspecified: Secondary | ICD-10-CM | POA: Diagnosis not present

## 2013-11-12 LAB — CBC WITH DIFFERENTIAL/PLATELET
Basophils Relative: 0.2 % (ref 0.0–3.0)
Eosinophils Relative: 6.8 % — ABNORMAL HIGH (ref 0.0–5.0)
HCT: 44 % (ref 39.0–52.0)
Hemoglobin: 14.8 g/dL (ref 13.0–17.0)
Lymphs Abs: 0.9 10*3/uL (ref 0.7–4.0)
MCV: 80.6 fl (ref 78.0–100.0)
Monocytes Absolute: 0.3 10*3/uL (ref 0.1–1.0)
Neutrophils Relative %: 54.7 % (ref 43.0–77.0)
RBC: 5.46 Mil/uL (ref 4.22–5.81)
WBC: 3.1 10*3/uL — ABNORMAL LOW (ref 4.5–10.5)

## 2013-11-12 LAB — BASIC METABOLIC PANEL
BUN: 16 mg/dL (ref 6–23)
Chloride: 98 mEq/L (ref 96–112)
GFR: 75.3 mL/min (ref >60.00)
Potassium: 3.8 mEq/L (ref 3.5–5.1)
Sodium: 134 mEq/L — ABNORMAL LOW (ref 135–145)

## 2013-11-12 MED ORDER — TERBINAFINE HCL 250 MG PO TABS: 250.0000 mg | ORAL_TABLET | Freq: Every day | ORAL | Status: DC

## 2013-11-12 NOTE — Progress Notes (Signed)
Subjective:    Patient ID: Omar [name], male    DOB: May 24, 1946, 67 y.o.   MRN: [id number]  HPI 5 days ago, pt was seen in ER for severe pain at the right lateral chest-wall pain, worse in the context of inspiration.  No assoc rash.  Tramadol helps.  Quit smoking in the 1980's.  No h/o DVT.   Past Medical History  Diagnosis Date  . Hyperlipidemia   . GERD (gastroesophageal reflux disease)   . History of colonic polyps   . Decreased white blood cell count   . Prostatism   . BPH (benign prostatic hypertrophy)   . History of kidney stones   . History of rheumatic fever   . History of esophageal stricture 10/31/2007  . Diverticulosis   . Gastric polyps   . Prostate cancer 11/09/2012  . Depression   . Erectile dysfunction     Past Surgical History  Procedure Laterality Date  . Inguinal hernia repair    . Knee arthroscopy      left  . Tonsillectomy    . Carpal tunnel release      Bilateral  . Esophageal dilation      last dilation 8'12  . Robot assisted laparoscopic radical prostatectomy  11/08/2012    Procedure: ROBOTIC ASSISTED LAPAROSCOPIC RADICAL PROSTATECTOMY LEVEL 1;  Surgeon: Milford Cage, MD;  Location: [facility];  Service: Urology;  Laterality: N/A;     . Umbilical hernia repair N/A 04/15/2013    Procedure: HERNIA REPAIR UMBILICAL ADULT;  Surgeon: Adolph Pollack, MD;  Location: [facility] OR;  Service: General;  Laterality: N/A;  . Insertion of mesh N/A 04/15/2013    Procedure: INSERTION OF MESH;  Surgeon: Adolph Pollack, MD;  Location: [facility] OR;  Service: General;  Laterality: N/A;    History   Social History  . Marital Status: Married    Spouse Name: N/A    Number of Children: 4  . Years of Education: N/A   Occupational History  . pastor    Social History Main Topics  . Smoking status: Former Smoker    Types: Cigarettes  . Smokeless tobacco: Former Neurosurgeon    Quit date: 10/30/1994  . Alcohol Use: No  . Drug Use: No  . Sexual Activity: Yes   Other  Topics Concern  . Not on file   Social History Narrative  . No narrative on file    Current Outpatient Prescriptions on File Prior to Visit  Medication Sig Dispense Refill  . Avanafil (STENDRA) 100 MG TABS Take by mouth. 1-2 pills as needed for ED symptoms      . azithromycin (ZITHROMAX) 250 MG tablet Take 2 po the first day then once a day for the next 4 days.  6 tablet  0  . esomeprazole (NEXIUM) 40 MG capsule Take 40 mg by mouth daily at 12 noon.      . Multiple Vitamins-Minerals (ZINC PO) Take 1 tablet by mouth 2 (two) times daily.      Maxwell Caul Bicarbonate (ZEGERID) 20-1100 MG CAPS Take 1 capsule by mouth daily before breakfast.      . simvastatin (ZOCOR) 80 MG tablet Take 1 tablet (80 mg total) by mouth at bedtime.  30 tablet  11  . traMADol (ULTRAM) 50 MG tablet Take 2 tablets (100 mg total) by mouth every 6 (six) hours as needed for moderate pain.  40 tablet  0  . [DISCONTINUED] vardenafil (LEVITRA) 20 MG tablet Take 20 mg by  mouth as needed.         No current facility-administered medications on file prior to visit.    Allergies  Allergen Reactions  . Sulfonamide Derivatives     REACTION: blisters on skin    Family History  Problem Relation Age of Onset  . Ovarian cancer Mother   . Cancer Mother   . Lung cancer Maternal Uncle   . Lung cancer Maternal Aunt   . Colon cancer Neg Hx   . Esophageal cancer Neg Hx   . Stomach cancer Neg Hx   . Rectal cancer Neg Hx   . Stroke Father    BP 118/80  Pulse 64  Temp(Src) 98 F (36.7 C) (Oral)  Ht 6' 3.5" (1.918 m)  Wt 198 lb (89.812 kg)  BMI 24.41 kg/m2  SpO2 96%  Review of Systems Denies fever, sob, and weight loss.      Objective:   Physical Exam VITAL SIGNS:  See vs page. GENERAL: no distress. LUNGS:  Clear to auscultation Ext: no edema  CXR: NAD    Assessment & Plan:  Chest wall pain, persistent Abnormal CXR: improved.  ? Related to chest pain.

## 2013-11-12 NOTE — Patient Instructions (Addendum)
Please continue tramadol as needed for pain.   blood tests, and a repeat chest-x-ray, are being requested for you today.  We'll contact you with results.   i have sent a prescription to your pharmacy, for the toenail fungus.

## 2013-11-13 ENCOUNTER — Inpatient Hospital Stay (HOSPITAL_COMMUNITY)
Admission: EM | Admit: 2013-11-13 | Discharge: 2013-11-14 | DRG: 176 | Disposition: A | Discharge status: Home / Self Care | Payer: Medicare Other | Attending: Internal Medicine | Admitting: Internal Medicine

## 2013-11-13 ENCOUNTER — Ambulatory Visit (HOSPITAL_COMMUNITY)
Admission: RE | Admit: 2013-11-13 | Discharge: 2013-11-13 | Disposition: A | Discharge status: Home / Self Care | Payer: Medicare Other | Source: Ambulatory Visit | Attending: Endocrinology | Admitting: Endocrinology

## 2013-11-13 ENCOUNTER — Encounter (HOSPITAL_COMMUNITY): Payer: Self-pay | Admitting: Emergency Medicine

## 2013-11-13 ENCOUNTER — Telehealth: Payer: Self-pay | Admitting: Endocrinology

## 2013-11-13 DIAGNOSIS — F32A Depression, unspecified: Secondary | ICD-10-CM | POA: Diagnosis present

## 2013-11-13 DIAGNOSIS — K222 Esophageal obstruction: Secondary | ICD-10-CM

## 2013-11-13 DIAGNOSIS — N400 Enlarged prostate without lower urinary tract symptoms: Secondary | ICD-10-CM | POA: Diagnosis present

## 2013-11-13 DIAGNOSIS — R071 Chest pain on breathing: Secondary | ICD-10-CM | POA: Diagnosis not present

## 2013-11-13 DIAGNOSIS — C61 Malignant neoplasm of prostate: Secondary | ICD-10-CM

## 2013-11-13 DIAGNOSIS — E785 Hyperlipidemia, unspecified: Secondary | ICD-10-CM | POA: Diagnosis not present

## 2013-11-13 DIAGNOSIS — Z801 Family history of malignant neoplasm of trachea, bronchus and lung: Secondary | ICD-10-CM | POA: Diagnosis not present

## 2013-11-13 DIAGNOSIS — Z860100 Personal history of colon polyps, unspecified: Secondary | ICD-10-CM

## 2013-11-13 DIAGNOSIS — J029 Acute pharyngitis, unspecified: Secondary | ICD-10-CM | POA: Diagnosis present

## 2013-11-13 DIAGNOSIS — F3289 Other specified depressive episodes: Secondary | ICD-10-CM

## 2013-11-13 DIAGNOSIS — Z87891 Personal history of nicotine dependence: Secondary | ICD-10-CM | POA: Diagnosis not present

## 2013-11-13 DIAGNOSIS — I517 Cardiomegaly: Secondary | ICD-10-CM | POA: Diagnosis not present

## 2013-11-13 DIAGNOSIS — Z87442 Personal history of urinary calculi: Secondary | ICD-10-CM | POA: Diagnosis not present

## 2013-11-13 DIAGNOSIS — N529 Male erectile dysfunction, unspecified: Secondary | ICD-10-CM

## 2013-11-13 DIAGNOSIS — I2699 Other pulmonary embolism without acute cor pulmonale: Secondary | ICD-10-CM | POA: Diagnosis not present

## 2013-11-13 DIAGNOSIS — Z8041 Family history of malignant neoplasm of ovary: Secondary | ICD-10-CM | POA: Diagnosis not present

## 2013-11-13 DIAGNOSIS — K219 Gastro-esophageal reflux disease without esophagitis: Secondary | ICD-10-CM | POA: Diagnosis not present

## 2013-11-13 DIAGNOSIS — Z823 Family history of stroke: Secondary | ICD-10-CM

## 2013-11-13 DIAGNOSIS — R0789 Other chest pain: Secondary | ICD-10-CM

## 2013-11-13 DIAGNOSIS — Z79899 Other long term (current) drug therapy: Secondary | ICD-10-CM

## 2013-11-13 DIAGNOSIS — F329 Major depressive disorder, single episode, unspecified: Secondary | ICD-10-CM | POA: Diagnosis present

## 2013-11-13 DIAGNOSIS — K429 Umbilical hernia without obstruction or gangrene: Secondary | ICD-10-CM

## 2013-11-13 DIAGNOSIS — Z8546 Personal history of malignant neoplasm of prostate: Secondary | ICD-10-CM

## 2013-11-13 DIAGNOSIS — M79609 Pain in unspecified limb: Secondary | ICD-10-CM | POA: Diagnosis not present

## 2013-11-13 DIAGNOSIS — H8110 Benign paroxysmal vertigo, unspecified ear: Secondary | ICD-10-CM

## 2013-11-13 DIAGNOSIS — D72819 Decreased white blood cell count, unspecified: Secondary | ICD-10-CM

## 2013-11-13 LAB — BASIC METABOLIC PANEL
BUN: 14 mg/dL (ref 6–23)
Chloride: 98 mEq/L (ref 96–112)
GFR calc Af Amer: 68 mL/min — ABNORMAL LOW (ref >90)
Glucose, Bld: 97 mg/dL (ref 70–99)
Potassium: 3.8 mEq/L (ref 3.5–5.1)

## 2013-11-13 LAB — TROPONIN I: Troponin I: <0.3 ng/mL (ref <0.30)

## 2013-11-13 LAB — CBC
HCT: 45.1 % (ref 39.0–52.0)
Hemoglobin: 15.3 g/dL (ref 13.0–17.0)
MCH: 27.6 pg (ref 26.0–34.0)
MCHC: 33.9 g/dL (ref 30.0–36.0)
WBC: 4.7 10*3/uL (ref 4.0–10.5)

## 2013-11-13 LAB — PROTIME-INR
INR: 0.95 (ref 0.00–1.49)
Prothrombin Time: 12.5 seconds (ref 11.6–15.2)

## 2013-11-13 MED ORDER — ONDANSETRON HCL 4 MG PO TABS: 4.0000 mg | ORAL_TABLET | Freq: Four times a day (QID) | ORAL | Status: DC | PRN

## 2013-11-13 MED ORDER — SODIUM CHLORIDE 0.9 % IJ SOLN
3.0000 mL | Freq: Two times a day (BID) | INTRAMUSCULAR | Status: DC
  Administered 2013-11-13: 3 mL via INTRAVENOUS

## 2013-11-13 MED ORDER — ATORVASTATIN CALCIUM 40 MG PO TABS
40.0000 mg | ORAL_TABLET | Freq: Every day | ORAL | Status: DC
  Administered 2013-11-13 – 2013-11-14 (×2): 40 mg via ORAL
  Filled 2013-11-13 (×2): qty 1

## 2013-11-13 MED ORDER — HEPARIN (PORCINE) IN NACL 100-0.45 UNIT/ML-% IJ SOLN
1450.0000 [IU]/h | INTRAMUSCULAR | Status: DC
  Administered 2013-11-13: 1450 [IU]/h via INTRAVENOUS
  Filled 2013-11-13: qty 250

## 2013-11-13 MED ORDER — ACETAMINOPHEN 650 MG RE SUPP: 650.0000 mg | Freq: Four times a day (QID) | RECTAL | Status: DC | PRN

## 2013-11-13 MED ORDER — ENOXAPARIN SODIUM 100 MG/ML ~~LOC~~ SOLN
1.0000 mg/kg | Freq: Once | SUBCUTANEOUS | Status: AC
  Administered 2013-11-13: 90 mg via SUBCUTANEOUS
  Filled 2013-11-13: qty 1

## 2013-11-13 MED ORDER — TRAMADOL HCL 50 MG PO TABS
100.0000 mg | ORAL_TABLET | Freq: Four times a day (QID) | ORAL | Status: DC | PRN
  Administered 2013-11-14: 100 mg via ORAL
  Filled 2013-11-13: qty 2

## 2013-11-13 MED ORDER — IOHEXOL 350 MG/ML SOLN
100.0000 mL | Freq: Once | INTRAVENOUS | Status: AC | PRN
  Administered 2013-11-13: 100 mL via INTRAVENOUS

## 2013-11-13 MED ORDER — ONDANSETRON HCL 4 MG/2ML IJ SOLN: 4.0000 mg | Freq: Four times a day (QID) | INTRAMUSCULAR | Status: DC | PRN

## 2013-11-13 MED ORDER — ENOXAPARIN SODIUM 100 MG/ML ~~LOC~~ SOLN
SUBCUTANEOUS | Status: AC
  Filled 2013-11-13: qty 1

## 2013-11-13 MED ORDER — ACETAMINOPHEN 325 MG PO TABS: 650.0000 mg | ORAL_TABLET | Freq: Four times a day (QID) | ORAL | Status: DC | PRN

## 2013-11-13 MED ORDER — PANTOPRAZOLE SODIUM 40 MG PO TBEC
80.0000 mg | DELAYED_RELEASE_TABLET | Freq: Every day | ORAL | Status: DC
  Administered 2013-11-14: 80 mg via ORAL
  Filled 2013-11-13 (×2): qty 2

## 2013-11-13 NOTE — H&P (Signed)
[facility] History and Physical  Omar [name] ZOX:[id number] DOB: 02-22-1946 DOA: 11/13/2013   PCP: Romero Belling, MD   Chief Complaint: Chest discomfort, shortness of breath, fatigue  HPI:  67 year old male with a history of hyperlipidemia, prostate cancer, GERD presents with one-week history of chest discomfort and intermittent shortness of breath. The patient states that on 11/06/2013 he had right-sided chest discomfort that was pleuritic in nature. He came to the emergency department at that time. Chest x-ray showed bibasilar atelectasis/opacities. The patient was sent home with azithromycin with which he was compliant. Unfortunately, the patient continued to have intermittent chest discomfort and dyspnea on exertion. He went to see his urologist on 11/11/2013. He was deemed to be medically stable from his prostate cancer standpoint. He went to see his primary care provider on 11/12/2013. A CT angiogram of the chest was scheduled for today. His primary care provider called him with results suggestive of pulmonary embolus, and the patient was advised to come to the emergency department. Currently, the patient has not had any chest discomfort, dizziness, shortness of breath. There is no family history of thromboembolism, recent [place] travels, surgeries, or trauma. The patient had some subjective chills over the weekend prior to admission. He denies any headache, visual disturbance, dizziness, syncope, nausea, vomiting, diarrhea, abdominal pain, dysuria, hematuria. There is no hemoptysis, hematemesis, hematochezia, hematuria.  In the emergency department, the patient was dynamically stable without any tachycardia. He was 100% oxygen saturation on room air. BMP showed serum creatinine 1.24. CBC was unremarkable. INR 0.95. D-dimer was 0.89. CT angiogram the chest showed bilateral pulmonary emboli in the left lower lobe and right upper lobe. There was a suggestion of possible right heart strain as  well as a wedge-shaped consolidation suggestive of a pulmonary infarction. Assessment/Plan: Pulmonary embolus -The patient's prostate cancer is his major risk factor -Given the suggestion of pulmonary infarction as well as right heart strain, start the patient on intravenous heparin -However, patient is clinically stable without any tachycardia, hypoxemia, or hemodynamic instability -Echocardiogram to clarify cardiac status -I have had a preliminary discussion with the patient regarding the risks, benefits, and alternatives of vitamin K antagonist versus Factor Xa inhibition -Lower extremity duplex r/o DVT Prostate cancer -In remission/stable -Patient follows Dr. Margarita Grizzle Hyperlipidemia -Continue Zocor Chest discomfort -Likely due to the patient's pulmonary embolus -Although the patient's EKG on 11/07/2013 did not have any ST-T wave abnormalities, I will repeat EKG today given the patient's chest discomfort -cycling Troponins        Past Medical History  Diagnosis Date  . Hyperlipidemia   . GERD (gastroesophageal reflux disease)   . History of colonic polyps   . Decreased white blood cell count   . Prostatism   . BPH (benign prostatic hypertrophy)   . History of kidney stones   . History of rheumatic fever   . History of esophageal stricture 10/31/2007  . Diverticulosis   . Gastric polyps   . Prostate cancer 11/09/2012  . Depression   . Erectile dysfunction    Past Surgical History  Procedure Laterality Date  . Inguinal hernia repair    . Knee arthroscopy      left  . Tonsillectomy    . Carpal tunnel release      Bilateral  . Esophageal dilation      last dilation 8'12  . Robot assisted laparoscopic radical prostatectomy  11/08/2012    Procedure: ROBOTIC ASSISTED LAPAROSCOPIC RADICAL PROSTATECTOMY LEVEL 1;  Surgeon: Milford Cage, MD;  Location: [facility] ORS;  Service: Urology;  Laterality: N/A;     . Umbilical hernia repair N/A 04/15/2013    Procedure: HERNIA  REPAIR UMBILICAL ADULT;  Surgeon: Adolph Pollack, MD;  Location[facility] OR;  Service: General;  Laterality: N/A;  . Insertion of mesh N/A 04/15/2013    Procedure: INSERTION OF MESH;  Surgeon: Adolph Pollack, MD;  Location: [facility] OR;  Service: General;  Laterality: N/A;   Social History:  reports that he has quit smoking. His smoking use included Cigarettes. He smoked 0.00 packs per day. He quit smokeless tobacco use about 19 years ago. He reports that he does not drink alcohol or use illicit drugs.   Family History  Problem Relation Age of Onset  . Ovarian cancer Mother   . Cancer Mother   . Lung cancer Maternal Uncle   . Lung cancer Maternal Aunt   . Colon cancer Neg Hx   . Esophageal cancer Neg Hx   . Stomach cancer Neg Hx   . Rectal cancer Neg Hx   . Stroke Father      Allergies  Allergen Reactions  . Sulfonamide Derivatives Other (See Comments)    REACTION: blisters on skin      Prior to Admission medications   Medication Sig Start Date End Date Taking? Authorizing Provider  esomeprazole (NEXIUM) 40 MG capsule Take 40 mg by mouth daily at 12 noon.   Yes Historical Provider, MD  Misc Natural Products (TOTAL MEMORY & FOCUS FORMULA PO) Take 1 capsule by mouth daily. FOCUS-FACTOR   Yes Historical Provider, MD  simvastatin (ZOCOR) 80 MG tablet Take 1 tablet (80 mg total) by mouth at bedtime. 05/10/13  Yes Romero Belling, MD  traMADol (ULTRAM) 50 MG tablet Take 2 tablets (100 mg total) by mouth every 6 (six) hours as needed for moderate pain. 11/07/13  Yes Ward Givens, MD  Avanafil (STENDRA) 100 MG TABS Take by mouth. 1-2 pills as needed for ED symptoms    Historical Provider, MD  azithromycin (ZITHROMAX) 250 MG tablet Take 2 po the first day then once a day for the next 4 days. 11/07/13   Ward Givens, MD  terbinafine (LAMISIL) 250 MG tablet Take 1 tablet (250 mg total) by mouth daily. 11/12/13   Romero Belling, MD    Review of Systems:  Constitutional:  No weight loss, night sweats,  Fevers, chills, fatigue.  Head&Eyes: No headache.  No vision loss.  No eye pain or scotoma ENT:  No Difficulty swallowing,Tooth/dental problems,Sore throat,  No ear ache, post nasal drip,  Cardio-vascular:  No  Orthopnea, PND, swelling in lower extremities,  dizziness, palpitations  GI:  No  abdominal pain, nausea, vomiting, diarrhea, loss of appetite, hematochezia, melena, heartburn, indigestion, Resp:  . No cough. No coughing up of blood .No wheezing.No chest wall deformity  Skin:  no rash or lesions.  GU:  no dysuria, change in color of urine, no urgency or frequency. No flank pain.  Musculoskeletal:  No joint pain or swelling. No decreased range of motion. No back pain.  Psych:  No change in mood or affect. No depression or anxiety. Neurologic: No headache, no dysesthesia, no focal weakness, no vision loss. No syncope  Physical Exam: Filed Vitals:   11/13/13 1535  BP: 122/73  Pulse: 98  Temp: 97.5 F (36.4 C)  TempSrc: Oral  Resp: 20  Height: 6\' 3"  (1.905 m)  Weight: 90.719 kg (200 lb)  SpO2: 100%   General:  A&O x  3, NAD, nontoxic, pleasant/cooperative Head/Eye: No conjunctival hemorrhage, no icterus, [place]/AT, No nystagmus ENT:  No icterus,  No thrush, edentulous, no pharyngeal exudate Neck:  No masses, no lymphadenpathy, no bruits CV:  RRR, no rub, no gallop, no S3 Lung:  Bibasilar crackles, right greater than left. No wheezing. Good air movement. Abdomen: soft/NT, +BS, nondistended, no peritoneal signs Ext: No cyanosis, No rashes, No petechiae, No lymphangitis, No edema Neuro: CNII-XII intact, strength 4/5 in bilateral upper and lower extremities, no dysmetria  Labs on Admission:  Basic Metabolic Panel:  Recent Labs Lab 11/12/13 1008 11/13/13 1550  NA 134* 137  K 3.8 3.8  CL 98 98  CO2 30 28  GLUCOSE 83 97  BUN 16 14  CREATININE 1.2 1.24  CALCIUM 9.3 9.5   Liver Function Tests: No results found for this basename: AST, ALT, ALKPHOS, BILITOT, PROT,  ALBUMIN,  in the last 168 hours No results found for this basename: LIPASE, AMYLASE,  in the last 168 hours No results found for this basename: AMMONIA,  in the last 168 hours CBC:  Recent Labs Lab 11/12/13 1008 11/13/13 1550  WBC 3.1* 4.7  NEUTROABS 1.7  --   HGB 14.8 15.3  HCT 44.0 45.1  MCV 80.6 81.4  PLT 246.0 205   Cardiac Enzymes:  Recent Labs Lab 11/07/13 0235  TROPONINI <0.30   BNP: No components found with this basename: POCBNP,  CBG: No results found for this basename: GLUCAP,  in the last 168 hours  Radiological Exams on Admission: Dg Chest 2 View  11/12/2013   CLINICAL DATA:  Chest pain and shortness of breath.  EXAM: CHEST  2 VIEW  COMPARISON:  PA and lateral chest 11/07/2013 and PA and lateral chest 10/30/2012.  FINDINGS: Lungs are clear. Heart size is normal. No pneumothorax or pleural effusion. Eventration right hemidiaphragm is noted.  IMPRESSION: No acute disease.  Stable compared to prior exams.   Electronically Signed   By: Drusilla Kanner M.D.   On: 11/12/2013 13:26   Ct Angio Chest W/cm &/or Wo Cm  11/13/2013   CLINICAL DATA:  GERD, esophageal stricture, history of gastric polyps and prostate cancer, now with right-sided chest wall pain, evaluate for pulmonary embolism  EXAM: CT ANGIOGRAPHY CHEST WITH CONTRAST  TECHNIQUE: Multidetector CT imaging of the chest was performed using the standard protocol during bolus administration of intravenous contrast. Multiplanar CT image reconstructions including MIPs were obtained to evaluate the vascular anatomy.  CONTRAST:  OMNIPAQUE IOHEXOL 350 MG/ML SOLN  COMPARISON:  Chest radiograph -11/12/2013; CT abdomen and pelvis-06/10/2011  FINDINGS: Vascular Findings:  There is suboptimal opacification of the pulmonary arterial system with the main pulmonary artery measuring only 180 Hounsfield units. Given this limitation, there are near occlusive segmental filling defects within the left lower lobe pulmonary artery  (representative axial image 25, series 11), extending to involve primarily the anterior basilar segmental and subsegmental of the left lower lobe (images 209 and 217). There are segmental and subsegmental filling defects seen within the right upper lobe (image representative axial images 140 and 136).  Normal caliber of the main pulmonary artery. There is no reflux of intravenous contrast into the hepatic venous system. While the overall clot burden is deemed small, there is minimal bony the interventricular septum (axial image 83, series 7) with estimated RV/ LV ratio of 1.  Normal heart size. Trace amount of pericardial fluid, possibly physiologic. Coronary artery calcifications per Normal caliber of the thoracic aorta. No definite thoracic aortic dissection.  Conventional configuration of the aortic arch.  Review of the MIP images confirms the above findings.   ----------------------------------------------------------------------------------  Nonvascular Findings:  There are geographic wedge-shaped peripheral opacities within the right costophrenic angle which measure approximately 1.8 x 1.4 cm which in the setting of pulmonary embolism may represent an area of developing pulmonary infarction. There is a very minimal amount of subpleural ground-glass atelectasis within the dependent portion of the right lower lobe. Minimal subsegmental atelectasis within the right middle lobe. Otherwise, no focal airspace opacities. No discrete pulmonary nodules. No definite pleural effusion or pneumothorax. The central pulmonary airways widely patent. Scattered shotty mediastinal lymph nodes are individually not enlarged by size criteria. No mediastinal, hilar or axillary lymphadenopathy.  Limited early arterial phase evaluation of the upper abdomen in demonstrates an approximately 2.9 x 2.3 cm hypo attenuating (6 Hounsfield units) right-sided presumed adrenal adenoma, though note, the caudal aspect of this nodule is not image.   No acute or aggressive osseous abnormalities. Normal appearance of the thyroid gland.  IMPRESSION: 1. Examination is positive for bilateral pulmonary embolism, primarily involving the left lower and right upper lobes. While the overall clot burden is deemed small, there are CT findings indicative of right-sided heart strain (including a RV / LV ratio of 1) suggestive of sub massive pulmonary embolism. 2. Geographic slightly wedge-shaped consolidative opacities within the right costophrenic angle are nonspecific though in the setting of pulmonary embolism are suggestive of an area of pulmonary infarction. Otherwise, no focal airspace opacities. 3. Coronary artery calcifications. 4. Approximately 2.9 cm right-sided adrenal nodule is incompletely imaged, though grossly unchanged since abdominal CT performed 06/10/2011, suggestive of a benign adrenal adenoma. Further evaluation with nonemergent abdominal MRI could be performed for complete characterization as clinically indicated. Critical Value/emergent results were called by telephone at the time of interpretation on 11/13/2013 at 2:36 PM to Dr. Romero Belling , who verbally acknowledged these results.   Electronically Signed   By: Simonne Come M.D.   On: 11/13/2013 14:53    EKG: Independently reviewed. pending    Time spent:60 minutes Code Status:   FULL Family Communication:   Wife at bedside   Omar Pennie, DO  [facility] Pager [phone]  If 7PM-7AM, please contact night-coverage [email or web address] Password [facility] 11/13/2013, 6:19 PM

## 2013-11-13 NOTE — Progress Notes (Signed)
ANTICOAGULATION CONSULT NOTE - Initial Consult  Pharmacy Consult for Heparin Indication: pulmonary embolus  Allergies  Allergen Reactions  . Sulfonamide Derivatives Other (See Comments)    REACTION: blisters on skin    Patient Measurements: Height: 6\' 3"  (190.5 cm) Weight: 196 lb 14.4 oz (89.313 kg) IBW/kg (Calculated) : 84.5 Heparin Dosing Weight: 89.3 kg  Vital Signs: Temp: 98.3 F (36.8 C) (12/10 1846) Temp src: Oral (12/10 1846) BP: 124/86 mmHg (12/10 1846) Pulse Rate: 79 (12/10 1846)  Labs:  Recent Labs  11/12/13 1008 11/13/13 1550  HGB 14.8 15.3  HCT 44.0 45.1  PLT 246.0 205  APTT  --  31  LABPROT  --  12.5  INR  --  0.95  CREATININE 1.2 1.24    Estimated Creatinine Clearance: 69.1 ml/min (by C-G formula based on Cr of 1.24).   Medical History: Past Medical History  Diagnosis Date  . Hyperlipidemia   . GERD (gastroesophageal reflux disease)   . History of colonic polyps   . Decreased white blood cell count   . Prostatism   . BPH (benign prostatic hypertrophy)   . History of kidney stones   . History of rheumatic fever   . History of esophageal stricture 10/31/2007  . Diverticulosis   . Gastric polyps   . Prostate cancer 11/09/2012  . Depression   . Erectile dysfunction     Medications:  Scheduled:  . atorvastatin  40 mg Oral q1800  . [START ON 11/14/2013] pantoprazole  80 mg Oral Q1200  . sodium chloride  3 mL Intravenous Q12H    Assessment: Heparin for PE Suggestion of right heart strain Lovenox 90 mg SQ given in ED 4 PM today  Goal of Therapy:  Heparin level 0.3-0.7 units/ml Monitor platelets by anticoagulation protocol: Yes   Plan:  Heparin 1450 units/hour (16 Units/kg/hr) starting at 9 PM. No bolus Heparin level 8 hours after starting , then daily Monitor platelets, CBC Labs per protocol   Omar [name], Omar [name] 11/13/2013,6:54 PM

## 2013-11-13 NOTE — ED Notes (Signed)
Patient denies any chest pain or shortness of breath at this time. Patient in NAD. Will continue to monitor patient.

## 2013-11-13 NOTE — ED Notes (Addendum)
Pt sent over by Dr. Everardo All for abnormal CT results, pt states "they found a blood clot". Ct shows blood clot going to lungs.

## 2013-11-13 NOTE — ED Provider Notes (Signed)
CSN: [id number]     Arrival date & time 11/13/13  1532 History  This chart was scribed for Shelda Jakes, MD by Dorothey Baseman, ED Scribe. This patient was seen in room APA05/APA05 and the patient's care was started at 3:51 PM.    Chief Complaint  Patient presents with  . Abnormal Lab  . Shortness of Breath   Patient is a 67 y.o. male presenting with shortness of breath. The history is provided by the patient. No language interpreter was used.  Shortness of Breath Severity:  Mild Onset quality:  Sudden Timing:  Unable to specify Progression:  Improving Chronicity:  Recurrent Associated symptoms: sore throat   Associated symptoms: no abdominal pain, no chest pain, no cough, no fever, no headaches, no rash and no vomiting   Risk factors: hx of cancer    HPI Comments: NAZAIR FORTENBERRY is a 67 y.o. male who presents to the Emergency Department complaining of fatigue with associated very mild shortness of breath. He reports some associated sore throat, sinus pressure, and nausea. Patient was seen here on 11/07/2013 for similar complaints and received a chest x-ray and blood labs, including a D dimer work up, that were negative. Patient followed up with his PCP (Dr. Everardo All) today and received a CT, which indicated a possible PE, and the patient was advised to come here. He denies any chest pain at this time. He denies fever, chills, visual disturbance, cough, rhinorrhea, abdominal pain, emesis, diarrhea, dysuria, hematuria, rashes, leg swelling, headache. He denies history of bleeding easily or anticoagulant use. Patient reports a history of prostate cancer. He denies history of stroke.   Past Medical History  Diagnosis Date  . Hyperlipidemia   . GERD (gastroesophageal reflux disease)   . History of colonic polyps   . Decreased white blood cell count   . Prostatism   . BPH (benign prostatic hypertrophy)   . History of kidney stones   . History of rheumatic fever   . History of esophageal  stricture 10/31/2007  . Diverticulosis   . Gastric polyps   . Prostate cancer 11/09/2012  . Depression   . Erectile dysfunction    Past Surgical History  Procedure Laterality Date  . Inguinal hernia repair    . Knee arthroscopy      left  . Tonsillectomy    . Carpal tunnel release      Bilateral  . Esophageal dilation      last dilation 8'12  . Robot assisted laparoscopic radical prostatectomy  11/08/2012    Procedure: ROBOTIC ASSISTED LAPAROSCOPIC RADICAL PROSTATECTOMY LEVEL 1;  Surgeon: Milford Cage, MD;  Location: [facility] ORS;  Service: Urology;  Laterality: N/A;     . Umbilical hernia repair N/A 04/15/2013    Procedure: HERNIA REPAIR UMBILICAL ADULT;  Surgeon: Adolph Pollack, MD;  Location: [facility] OR;  Service: General;  Laterality: N/A;  . Insertion of mesh N/A 04/15/2013    Procedure: INSERTION OF MESH;  Surgeon: Adolph Pollack, MD;  Location: [facility] OR;  Service: General;  Laterality: N/A;   Family History  Problem Relation Age of Onset  . Ovarian cancer Mother   . Cancer Mother   . Lung cancer Maternal Uncle   . Lung cancer Maternal Aunt   . Colon cancer Neg Hx   . Esophageal cancer Neg Hx   . Stomach cancer Neg Hx   . Rectal cancer Neg Hx   . Stroke Father    History  Substance Use Topics  .  Smoking status: Former Smoker    Types: Cigarettes  . Smokeless tobacco: Former Neurosurgeon    Quit date: 10/30/1994  . Alcohol Use: No    Review of Systems  Constitutional: Positive for fatigue. Negative for fever and chills.  HENT: Positive for sinus pressure and sore throat. Negative for rhinorrhea.   Eyes: Negative for visual disturbance.  Respiratory: Positive for shortness of breath. Negative for cough.   Cardiovascular: Negative for chest pain and leg swelling.  Gastrointestinal: Positive for nausea. Negative for vomiting, abdominal pain and diarrhea.  Genitourinary: Negative for dysuria and hematuria.  Skin: Negative for rash.  Neurological: Negative for headaches.   Psychiatric/Behavioral: Negative for confusion.    Allergies  Sulfonamide derivatives  Home Medications   Current Outpatient Rx  Name  Route  Sig  Dispense  Refill  . esomeprazole (NEXIUM) 40 MG capsule   Oral   Take 40 mg by mouth daily at 12 noon.         . Misc Natural Products (TOTAL MEMORY & FOCUS FORMULA PO)   Oral   Take 1 capsule by mouth daily. FOCUS-FACTOR         . simvastatin (ZOCOR) 80 MG tablet   Oral   Take 1 tablet (80 mg total) by mouth at bedtime.   30 tablet   11   . traMADol (ULTRAM) 50 MG tablet   Oral   Take 2 tablets (100 mg total) by mouth every 6 (six) hours as needed for moderate pain.   40 tablet   0   . Avanafil (STENDRA) 100 MG TABS   Oral   Take by mouth. 1-2 pills as needed for ED symptoms         . azithromycin (ZITHROMAX) 250 MG tablet      Take 2 po the first day then once a day for the next 4 days.   6 tablet   0   . terbinafine (LAMISIL) 250 MG tablet   Oral   Take 1 tablet (250 mg total) by mouth daily.   90 tablet   0    Triage Vitals: BP 122/73  Pulse 98  Temp(Src) 97.5 F (36.4 C) (Oral)  Resp 20  Ht 6\' 3"  (1.905 m)  Wt 200 lb (90.719 kg)  BMI 25.00 kg/m2  SpO2 100%  Physical Exam  Nursing note and vitals reviewed. Constitutional: He is oriented to person, place, and time. He appears well-developed and well-nourished. No distress.  HENT:  Head: Normocephalic and atraumatic.  Eyes: Conjunctivae and EOM are normal. Pupils are equal, round, and reactive to light.  Neck: Normal range of motion. Neck supple.  Cardiovascular: Normal rate, regular rhythm and normal heart sounds.  Exam reveals no gallop and no friction rub.   No murmur heard. Pulmonary/Chest: Effort normal and breath sounds normal. No respiratory distress. He has no wheezes. He has no rales.  Abdominal: Soft. Bowel sounds are normal. He exhibits no distension. There is no tenderness.  Musculoskeletal: Normal range of motion. He exhibits no  edema.  No swelling to the bilateral lower extremities   Neurological: He is alert and oriented to person, place, and time. No cranial nerve deficit. He exhibits normal muscle tone. Coordination normal.  Skin: Skin is warm and dry.  Psychiatric: He has a normal mood and affect. His behavior is normal.    ED Course  Procedures (including critical care time)  DIAGNOSTIC STUDIES: Oxygen Saturation is 100% on room air, normal by my interpretation.  COORDINATION OF CARE: 3:57 PM- Discussed that symptoms do appear to be due to a pulmonary embolism and that the patient will need to be admitted to the hospital. Discussed that EKG results were normal and that a chest x-ray will not be necessary at this time. Will order blood labs. Discussed treatment plan with patient at bedside and patient verbalized agreement.     Labs Review Labs Reviewed  BASIC METABOLIC PANEL - Abnormal; Notable for the following:    GFR calc non Af Amer 58 (*)    GFR calc Af Amer 68 (*)    All other components within normal limits  PROTIME-INR  CBC  APTT   Results for orders placed during the hospital encounter of 11/13/13  PROTIME-INR      Result Value Range   Prothrombin Time 12.5  11.6 - 15.2 seconds   INR 0.95  0.00 - 1.49  BASIC METABOLIC PANEL      Result Value Range   Sodium 137  135 - 145 mEq/L   Potassium 3.8  3.5 - 5.1 mEq/L   Chloride 98  96 - 112 mEq/L   CO2 28  19 - 32 mEq/L   Glucose, Bld 97  70 - 99 mg/dL   BUN 14  6 - 23 mg/dL   Creatinine, Ser 4.54  0.50 - 1.35 mg/dL   Calcium 9.5  8.4 - 09.8 mg/dL   GFR calc non Af Amer 58 (*) >90 mL/min   GFR calc Af Amer 68 (*) >90 mL/min  CBC      Result Value Range   WBC 4.7  4.0 - 10.5 K/uL   RBC 5.54  4.22 - 5.81 MIL/uL   Hemoglobin 15.3  13.0 - 17.0 g/dL   HCT 11.9  14.7 - 82.9 %   MCV 81.4  78.0 - 100.0 fL   MCH 27.6  26.0 - 34.0 pg   MCHC 33.9  30.0 - 36.0 g/dL   RDW 56.2  13.0 - 86.5 %   Platelets 205  150 - 400 K/uL  APTT       Result Value Range   aPTT 31  24 - 37 seconds    Imaging Review Dg Chest 2 View  11/12/2013   CLINICAL DATA:  Chest pain and shortness of breath.  EXAM: CHEST  2 VIEW  COMPARISON:  PA and lateral chest 11/07/2013 and PA and lateral chest 10/30/2012.  FINDINGS: Lungs are clear. Heart size is normal. No pneumothorax or pleural effusion. Eventration right hemidiaphragm is noted.  IMPRESSION: No acute disease.  Stable compared to prior exams.   Electronically Signed   By: Drusilla Kanner M.D.   On: 11/12/2013 13:26   Ct Angio Chest W/cm &/or Wo Cm  11/13/2013   CLINICAL DATA:  GERD, esophageal stricture, history of gastric polyps and prostate cancer, now with right-sided chest wall pain, evaluate for pulmonary embolism  EXAM: CT ANGIOGRAPHY CHEST WITH CONTRAST  TECHNIQUE: Multidetector CT imaging of the chest was performed using the standard protocol during bolus administration of intravenous contrast. Multiplanar CT image reconstructions including MIPs were obtained to evaluate the vascular anatomy.  CONTRAST:  OMNIPAQUE IOHEXOL 350 MG/ML SOLN  COMPARISON:  Chest radiograph -11/12/2013; CT abdomen and pelvis-06/10/2011  FINDINGS: Vascular Findings:  There is suboptimal opacification of the pulmonary arterial system with the main pulmonary artery measuring only 180 Hounsfield units. Given this limitation, there are near occlusive segmental filling defects within the left lower lobe pulmonary artery (representative axial image 25,  series 11), extending to involve primarily the anterior basilar segmental and subsegmental of the left lower lobe (images 209 and 217). There are segmental and subsegmental filling defects seen within the right upper lobe (image representative axial images 140 and 136).  Normal caliber of the main pulmonary artery. There is no reflux of intravenous contrast into the hepatic venous system. While the overall clot burden is deemed small, there is minimal bony the interventricular  septum (axial image 83, series 7) with estimated RV/ LV ratio of 1.  Normal heart size. Trace amount of pericardial fluid, possibly physiologic. Coronary artery calcifications per Normal caliber of the thoracic aorta. No definite thoracic aortic dissection. Conventional configuration of the aortic arch.  Review of the MIP images confirms the above findings.   ----------------------------------------------------------------------------------  Nonvascular Findings:  There are geographic wedge-shaped peripheral opacities within the right costophrenic angle which measure approximately 1.8 x 1.4 cm which in the setting of pulmonary embolism may represent an area of developing pulmonary infarction. There is a very minimal amount of subpleural ground-glass atelectasis within the dependent portion of the right lower lobe. Minimal subsegmental atelectasis within the right middle lobe. Otherwise, no focal airspace opacities. No discrete pulmonary nodules. No definite pleural effusion or pneumothorax. The central pulmonary airways widely patent. Scattered shotty mediastinal lymph nodes are individually not enlarged by size criteria. No mediastinal, hilar or axillary lymphadenopathy.  Limited early arterial phase evaluation of the upper abdomen in demonstrates an approximately 2.9 x 2.3 cm hypo attenuating (6 Hounsfield units) right-sided presumed adrenal adenoma, though note, the caudal aspect of this nodule is not image.  No acute or aggressive osseous abnormalities. Normal appearance of the thyroid gland.  IMPRESSION: 1. Examination is positive for bilateral pulmonary embolism, primarily involving the left lower and right upper lobes. While the overall clot burden is deemed small, there are CT findings indicative of right-sided heart strain (including a RV / LV ratio of 1) suggestive of sub massive pulmonary embolism. 2. Geographic slightly wedge-shaped consolidative opacities within the right costophrenic angle are  nonspecific though in the setting of pulmonary embolism are suggestive of an area of pulmonary infarction. Otherwise, no focal airspace opacities. 3. Coronary artery calcifications. 4. Approximately 2.9 cm right-sided adrenal nodule is incompletely imaged, though grossly unchanged since abdominal CT performed 06/10/2011, suggestive of a benign adrenal adenoma. Further evaluation with nonemergent abdominal MRI could be performed for complete characterization as clinically indicated. Critical Value/emergent results were called by telephone at the time of interpretation on 11/13/2013 at 2:36 PM to Dr. Romero Belling , who verbally acknowledged these results.   Electronically Signed   By: Simonne Come M.D.   On: 11/13/2013 14:53    EKG Interpretation   None      Date: 11/13/2013  Rate: 83  Rhythm: normal sinus rhythm  QRS Axis: normal  Intervals: normal  ST/T Wave abnormalities: normal  Conduction Disutrbances:none  Narrative Interpretation:   Old EKG Reviewed: none available    MDM   1. Pulmonary embolus    Patient with CT scan today that documents bilateral pulmonary embolus. Patient not currently hypoxic and does have fatigue no significant shortness of breath. Patient was seen in the emergency apartment on the December 4 with negative d-dimer. Patient had CT scan ordered by primary care Dr. today because symptoms continued. On December 4 patient did have pleuritic type chest pain. Patient started on Lovenox here. Patient has no history of any significant bleeding problems. We'll discuss with the hospitalist about admission and  cardiac monitoring.  I personally performed the services described in this documentation, which was scribed in my presence. The recorded information has been reviewed and is accurate.      Shelda Jakes, MD 11/13/13 [phone]

## 2013-11-13 NOTE — Telephone Encounter (Signed)
please call patient: CT showed blood clots going to the lungs. Go to ER now

## 2013-11-13 NOTE — Telephone Encounter (Signed)
Patient informed and stated that he would go the ER.

## 2013-11-14 ENCOUNTER — Inpatient Hospital Stay (HOSPITAL_COMMUNITY): Payer: Medicare Other

## 2013-11-14 DIAGNOSIS — I2699 Other pulmonary embolism without acute cor pulmonale: Secondary | ICD-10-CM | POA: Diagnosis not present

## 2013-11-14 DIAGNOSIS — M79609 Pain in unspecified limb: Secondary | ICD-10-CM | POA: Diagnosis not present

## 2013-11-14 DIAGNOSIS — R071 Chest pain on breathing: Secondary | ICD-10-CM | POA: Diagnosis not present

## 2013-11-14 DIAGNOSIS — I517 Cardiomegaly: Secondary | ICD-10-CM | POA: Diagnosis not present

## 2013-11-14 LAB — CBC
Hemoglobin: 14.4 g/dL (ref 13.0–17.0)
MCHC: 34 g/dL (ref 30.0–36.0)
Platelets: 186 10*3/uL (ref 150–400)
RDW: 12.5 % (ref 11.5–15.5)

## 2013-11-14 LAB — BASIC METABOLIC PANEL
BUN: 13 mg/dL (ref 6–23)
Calcium: 9 mg/dL (ref 8.4–10.5)
Creatinine, Ser: 1.13 mg/dL (ref 0.50–1.35)
GFR calc Af Amer: 76 mL/min — ABNORMAL LOW (ref >90)
GFR calc non Af Amer: 65 mL/min — ABNORMAL LOW (ref >90)
Potassium: 3.8 mEq/L (ref 3.5–5.1)
Sodium: 137 mEq/L (ref 135–145)

## 2013-11-14 LAB — TROPONIN I: Troponin I: <0.3 ng/mL (ref <0.30)

## 2013-11-14 LAB — HEPARIN LEVEL (UNFRACTIONATED): Heparin Unfractionated: 1.02 IU/mL — ABNORMAL HIGH (ref 0.30–0.70)

## 2013-11-14 MED ORDER — RIVAROXABAN 20 MG PO TABS: 20.0000 mg | ORAL_TABLET | Freq: Every day | ORAL | Status: DC

## 2013-11-14 MED ORDER — RIVAROXABAN 15 MG PO TABS: 15.0000 mg | ORAL_TABLET | Freq: Two times a day (BID) | ORAL | Status: DC

## 2013-11-14 MED ORDER — HEPARIN (PORCINE) IN NACL 100-0.45 UNIT/ML-% IJ SOLN
1150.0000 [IU]/h | INTRAMUSCULAR | Status: DC
  Administered 2013-11-14: 1150 [IU]/h via INTRAVENOUS

## 2013-11-14 MED ORDER — RIVAROXABAN 15 MG PO TABS
15.0000 mg | ORAL_TABLET | Freq: Two times a day (BID) | ORAL | Status: DC
  Administered 2013-11-14: 15 mg via ORAL
  Filled 2013-11-14: qty 1

## 2013-11-14 NOTE — Progress Notes (Signed)
ANTICOAGULATION CONSULT NOTE -   Pharmacy Consult for Heparin Indication: pulmonary embolus  Allergies  Allergen Reactions  . Sulfonamide Derivatives Other (See Comments)    REACTION: blisters on skin    Patient Measurements: Height: 6\' 3"  (190.5 cm) Weight: 196 lb 14.4 oz (89.313 kg) IBW/kg (Calculated) : 84.5 Heparin Dosing Weight: 89.3 kg  Vital Signs: Temp: 97.4 F (36.3 C) (12/11 0656) Temp src: Oral (12/11 0656) BP: 111/72 mmHg (12/11 0656) Pulse Rate: 69 (12/11 0656)  Labs:  Recent Labs  11/12/13 1008 11/13/13 1550 11/13/13 1901 11/14/13 0048 11/14/13 0554  HGB 14.8 15.3  --   --  14.4  HCT 44.0 45.1  --   --  42.4  PLT 246.0 205  --   --  186  APTT  --  31  --   --   --   LABPROT  --  12.5  --   --   --   INR  --  0.95  --   --   --   HEPARINUNFRC  --   --   --   --  1.02*  CREATININE 1.2 1.24  --   --  1.13  TROPONINI  --   --  <0.30 <0.30 <0.30    Estimated Creatinine Clearance: 75.8 ml/min (by C-G formula based on Cr of 1.13).   Medical History: Past Medical History  Diagnosis Date  . Hyperlipidemia   . GERD (gastroesophageal reflux disease)   . History of colonic polyps   . Decreased white blood cell count   . Prostatism   . BPH (benign prostatic hypertrophy)   . History of kidney stones   . History of rheumatic fever   . History of esophageal stricture 10/31/2007  . Diverticulosis   . Gastric polyps   . Prostate cancer 11/09/2012  . Depression   . Erectile dysfunction     Medications:  Scheduled:  . atorvastatin  40 mg Oral q1800  . pantoprazole  80 mg Oral Q1200  . sodium chloride  3 mL Intravenous Q12H    Assessment: Heparin for PE Suggestion of right heart strain Lovenox 90 mg SQ given in ED  Heparin level above goal  Goal of Therapy:  Heparin level 0.3-0.7 units/ml Monitor platelets by anticoagulation protocol: Yes   Plan:  Hold heparin infusion for 1 hour, restart at 1150 units/hr Heparin level 6 hours after  starting , then daily Monitor platelets, CBC Labs per protocol   Raquel James, Axell Trigueros Bennett 11/14/2013,7:19 AM

## 2013-11-14 NOTE — Progress Notes (Addendum)
Pt's Heparin level (unfractionated) lab result is 1.02 IU/ml. Benny, the on call pharmacist, stated to hold the Heparin for an hour then restart the Heparin at 1150 units/hour at 7:50 am. Day nurse, Shon Hale, will be made aware. Pt safe and stable at this time. Will continue to monitor.

## 2013-11-14 NOTE — Progress Notes (Addendum)
Patient's IV removed.  Site WNL.  AVS reviewed with patient.  Dr. George Hugh office did not call back with appointment time.  Patient instructed to contact office in the morning for follow-up information.  Patient verbalized understanding of discharge instructions, medications and physician follow-up.  Patient educated on Xarelto by Careers adviser.  Patient refused wheelchair and ambulated to main entrance for discharge accompanied by NT.  Patient stable at time of discharge.

## 2013-11-14 NOTE — Progress Notes (Signed)
Utilization Review Complete  

## 2013-11-14 NOTE — Progress Notes (Signed)
*  PRELIMINARY RESULTS* Echocardiogram 2D Echocardiogram has been performed.  Ein Rijo 11/14/2013, 9:56 AM

## 2013-11-14 NOTE — Progress Notes (Signed)
RN called Dr. Harlow Asa office for follow-up appointment.  Left message for office to call RN back.  Will educate patient on follow-up appointment.

## 2013-11-14 NOTE — Discharge Summary (Addendum)
Physician Discharge Summary  JEDD SCHULENBURG ZOX:[id number] DOB: 1945-12-11 DOA: 11/13/2013  PCP: Romero Belling, MD  Admit date: 11/13/2013 Discharge date: 11/14/2013  Recommendations for Outpatient Follow-up:  1. Pt will need to follow up with PCP in 2 weeks post discharge 2. Please obtain BMP to evaluate electrolytes and kidney function 3. Please also check CBC to evaluate Hg and Hct levels   Discharge Diagnoses:  Active Problems:   DEPRESSION   Prostate cancer   Right-sided chest wall pain   Pulmonary embolus   Pulmonary embolism   Hyperlipidemia Pulmonary embolus  -The patient's prostate cancer is his major risk factor  -Given the suggestion of pulmonary infarction as well as right heart strain,  the patient was started on intravenous heparin  -However, patient remains clinically stable without any tachycardia, hypoxemia, or hemodynamic instability  -Echocardiogram--normal RV function without collapse.  EF 65-70%. Grade 1 diastolic dysfunction. -I have had a pdiscussion with the patient regarding the risks, benefits, and alternatives of vitamin K antagonist versus Factor Xa inhibition  -After discussion, the patient preferred to take rivaroxaban.  Heparin drip was ultimately stopped and pt started on Xarelto -Lower extremity duplex--neg for DVT  Prostate cancer  -In remission/stable  -Patient follows Dr. Margarita Grizzle  Hyperlipidemia  -Continue Zocor  Chest discomfort  -Likely due to the patient's pulmonary embolus  -EKG negative for ST-T wave changes -cycling Troponins--neg x 3   Discharge Condition: Stable  Disposition:  discharge home  Diet: Heart healthy Wt Readings from Last 3 Encounters:  11/13/13 89.313 kg (196 lb 14.4 oz)  11/12/13 89.812 kg (198 lb)  11/07/13 92.08 kg (203 lb)    History of present illness:  67 year old male with a history of hyperlipidemia, prostate cancer, GERD presents with one-week history of chest discomfort and intermittent shortness of  breath. The patient states that on 11/06/2013 he had right-sided chest discomfort that was pleuritic in nature. He came to the emergency department at that time. Chest x-ray showed bibasilar atelectasis/opacities. The patient was sent home with azithromycin with which he was compliant. Unfortunately, the patient continued to have intermittent chest discomfort and dyspnea on exertion. He went to see his urologist on 11/11/2013. He was deemed to be medically stable from his prostate cancer standpoint. He went to see his primary care provider on 11/12/2013. A CT angiogram of the chest was scheduled for 11/13/2013. His primary care provider called him with results suggestive of pulmonary embolus, and the patient was advised to come to the emergency department.  There is no family history of thromboembolism, recent [place] travels, surgeries, or trauma. The patient had some subjective chills over the weekend prior to admission. He denies any headache, visual disturbance, dizziness, syncope, nausea, vomiting, diarrhea, abdominal pain, dysuria, hematuria. There is no hemoptysis, hematemesis, hematochezia, hematuria.  In the emergency department, the patient was dynamically stable without any tachycardia. He was 100% oxygen saturation on room air. BMP showed serum creatinine 1.24. CBC was unremarkable. INR 0.95. D-dimer was 0.89. CT angiogram the chest showed bilateral pulmonary emboli in the left lower lobe and right upper lobe. There was a suggestion of possible right heart strain as well as a wedge-shaped consolidation suggestive of a pulmonary infarction.  The patient was started on a heparin drip to 2 concerns of pulmonary infarction and right ventricular strain. Echocardiogram was performed and showed normal right ventricular function without any collapse. Left ventricular ejection fraction 65-70% with grade 1 diastolic dysfunction. There was no wall motion abnormalities. Troponins were negative x3. EKG  shows sinus  rhythm without any ST-T wave changes. The patient remained hemodynamically stable and afebrile. He was not tachycardic. He was not hypoxemic. The patient was started back on his home medications. His heparin drip was discontinued. The patient was started on rivaroxaban. Risks, benefits, and alternatives of anticoagulation were discussed with the patient. He expressed understanding and agreed to follow treatment protocols. He preferred to take rivaroxaban over vitamin K antagonist. The patient was started on rivaroxaban 15 mg twice a day for 21 days. He was then started on 20 mg daily. The patient will need followup with his primary care provider for further monitoring.     Discharge Exam: Filed Vitals:   11/14/13 1455  BP: 112/80  Pulse: 74  Temp:   Resp: 18   Filed Vitals:   11/13/13 1846 11/13/13 2315 11/14/13 0656 11/14/13 1455  BP: 124/86 114/83 111/72 112/80  Pulse: 79 73 69 74  Temp: 98.3 F (36.8 C) 98.2 F (36.8 C) 97.4 F (36.3 C)   TempSrc: Oral Oral Oral   Resp: 18 18 20 18   Height: 6\' 3"  (1.905 m)     Weight: 89.313 kg (196 lb 14.4 oz)     SpO2: 100% 99% 98% 98%   General: A&O x 3, NAD, pleasant, cooperative Cardiovascular: RRR, no rub, no gallop, no S3 Respiratory: Left clear to auscultation. Right basilar crackle. No wheezing. Good air movement. Abdomen:soft, nontender, nondistended, positive bowel sounds Extremities: No edema, No lymphangitis, no petechiae  Discharge Instructions      Discharge Orders   Future Orders Complete By Expires   Diet - low sodium heart healthy  As directed    Discharge instructions  As directed    Comments:     Take rivaroxaban 15 mg twice a day x21 days On day #22 (12/06/2013) take rivaroxaban 20 mg once daily   Increase activity slowly  As directed        Medication List    STOP taking these medications       azithromycin 250 MG tablet  Commonly known as:  ZITHROMAX      TAKE these medications       esomeprazole 40  MG capsule  Commonly known as:  NEXIUM  Take 40 mg by mouth daily at 12 noon.     Rivaroxaban 15 MG Tabs tablet  Commonly known as:  XARELTO  Take 1 tablet (15 mg total) by mouth 2 (two) times daily with a meal.     Rivaroxaban 20 MG Tabs tablet  Commonly known as:  XARELTO  Take 1 tablet (20 mg total) by mouth daily with supper. Start on 12/06/2013     simvastatin 80 MG tablet  Commonly known as:  ZOCOR  Take 1 tablet (80 mg total) by mouth at bedtime.     STENDRA 100 MG Tabs  Generic drug:  Avanafil  Take by mouth. 1-2 pills as needed for ED symptoms     terbinafine 250 MG tablet  Commonly known as:  LAMISIL  Take 1 tablet (250 mg total) by mouth daily.     TOTAL MEMORY & FOCUS FORMULA PO  Take 1 capsule by mouth daily. FOCUS-FACTOR     traMADol 50 MG tablet  Commonly known as:  ULTRAM  Take 2 tablets (100 mg total) by mouth every 6 (six) hours as needed for moderate pain.         The results of significant diagnostics from this hospitalization (including imaging, microbiology, ancillary and laboratory) are listed  below for reference.    Significant Diagnostic Studies: Dg Chest 2 View  11/12/2013   CLINICAL DATA:  Chest pain and shortness of breath.  EXAM: CHEST  2 VIEW  COMPARISON:  PA and lateral chest 11/07/2013 and PA and lateral chest 10/30/2012.  FINDINGS: Lungs are clear. Heart size is normal. No pneumothorax or pleural effusion. Eventration right hemidiaphragm is noted.  IMPRESSION: No acute disease.  Stable compared to prior exams.   Electronically Signed   By: Drusilla Kanner M.D.   On: 11/12/2013 13:26   Dg Chest 2 View  11/07/2013   CLINICAL DATA:  Right-sided chest pain  EXAM: CHEST  2 VIEW  COMPARISON:  10/30/2012  FINDINGS: Cardiomediastinal contours are similar to prior. Eventration right hemidiaphragm. Increased right greater than left lower lobe opacities. Suspect small effusions. No pneumothorax. No acute osseous finding.  IMPRESSION: Interval  development of small effusions and bibasilar opacities, right greater than left ; atelectasis versus pneumonia.   Electronically Signed   By: Jearld Lesch M.D.   On: 11/07/2013 02:59   Ct Angio Chest W/cm &/or Wo Cm  11/13/2013   CLINICAL DATA:  GERD, esophageal stricture, history of gastric polyps and prostate cancer, now with right-sided chest wall pain, evaluate for pulmonary embolism  EXAM: CT ANGIOGRAPHY CHEST WITH CONTRAST  TECHNIQUE: Multidetector CT imaging of the chest was performed using the standard protocol during bolus administration of intravenous contrast. Multiplanar CT image reconstructions including MIPs were obtained to evaluate the vascular anatomy.  CONTRAST:  OMNIPAQUE IOHEXOL 350 MG/ML SOLN  COMPARISON:  Chest radiograph -11/12/2013; CT abdomen and pelvis-06/10/2011  FINDINGS: Vascular Findings:  There is suboptimal opacification of the pulmonary arterial system with the main pulmonary artery measuring only 180 Hounsfield units. Given this limitation, there are near occlusive segmental filling defects within the left lower lobe pulmonary artery (representative axial image 25, series 11), extending to involve primarily the anterior basilar segmental and subsegmental of the left lower lobe (images 209 and 217). There are segmental and subsegmental filling defects seen within the right upper lobe (image representative axial images 140 and 136).  Normal caliber of the main pulmonary artery. There is no reflux of intravenous contrast into the hepatic venous system. While the overall clot burden is deemed small, there is minimal bony the interventricular septum (axial image 83, series 7) with estimated RV/ LV ratio of 1.  Normal heart size. Trace amount of pericardial fluid, possibly physiologic. Coronary artery calcifications per Normal caliber of the thoracic aorta. No definite thoracic aortic dissection. Conventional configuration of the aortic arch.  Review of the MIP images  confirms the above findings.   ----------------------------------------------------------------------------------  Nonvascular Findings:  There are geographic wedge-shaped peripheral opacities within the right costophrenic angle which measure approximately 1.8 x 1.4 cm which in the setting of pulmonary embolism may represent an area of developing pulmonary infarction. There is a very minimal amount of subpleural ground-glass atelectasis within the dependent portion of the right lower lobe. Minimal subsegmental atelectasis within the right middle lobe. Otherwise, no focal airspace opacities. No discrete pulmonary nodules. No definite pleural effusion or pneumothorax. The central pulmonary airways widely patent. Scattered shotty mediastinal lymph nodes are individually not enlarged by size criteria. No mediastinal, hilar or axillary lymphadenopathy.  Limited early arterial phase evaluation of the upper abdomen in demonstrates an approximately 2.9 x 2.3 cm hypo attenuating (6 Hounsfield units) right-sided presumed adrenal adenoma, though note, the caudal aspect of this nodule is not image.  No acute or  aggressive osseous abnormalities. Normal appearance of the thyroid gland.  IMPRESSION: 1. Examination is positive for bilateral pulmonary embolism, primarily involving the left lower and right upper lobes. While the overall clot burden is deemed small, there are CT findings indicative of right-sided heart strain (including a RV / LV ratio of 1) suggestive of sub massive pulmonary embolism. 2. Geographic slightly wedge-shaped consolidative opacities within the right costophrenic angle are nonspecific though in the setting of pulmonary embolism are suggestive of an area of pulmonary infarction. Otherwise, no focal airspace opacities. 3. Coronary artery calcifications. 4. Approximately 2.9 cm right-sided adrenal nodule is incompletely imaged, though grossly unchanged since abdominal CT performed 06/10/2011, suggestive of a  benign adrenal adenoma. Further evaluation with nonemergent abdominal MRI could be performed for complete characterization as clinically indicated. Critical Value/emergent results were called by telephone at the time of interpretation on 11/13/2013 at 2:36 PM to Dr. Romero Belling , who verbally acknowledged these results.   Electronically Signed   By: Simonne Come M.D.   On: 11/13/2013 14:53     Microbiology: No results found for this or any previous visit (from the past 240 hour(s)).   Labs: Basic Metabolic Panel:  Recent Labs Lab 11/12/13 1008 11/13/13 1550 11/14/13 0554  NA 134* 137 137  K 3.8 3.8 3.8  CL 98 98 100  CO2 30 28 26   GLUCOSE 83 97 93  BUN 16 14 13   CREATININE 1.2 1.24 1.13  CALCIUM 9.3 9.5 9.0   Liver Function Tests: No results found for this basename: AST, ALT, ALKPHOS, BILITOT, PROT, ALBUMIN,  in the last 168 hours No results found for this basename: LIPASE, AMYLASE,  in the last 168 hours No results found for this basename: AMMONIA,  in the last 168 hours CBC:  Recent Labs Lab 11/12/13 1008 11/13/13 1550 11/14/13 0554  WBC 3.1* 4.7 3.5*  NEUTROABS 1.7  --   --   HGB 14.8 15.3 14.4  HCT 44.0 45.1 42.4  MCV 80.6 81.4 81.1  PLT 246.0 205 186   Cardiac Enzymes:  Recent Labs Lab 11/13/13 1901 11/14/13 0048 11/14/13 0554  TROPONINI <0.30 <0.30 <0.30   BNP: No components found with this basename: POCBNP,  CBG: No results found for this basename: GLUCAP,  in the last 168 hours  Time coordinating discharge:  Greater than 30 minutes  Signed:  Zeb Rawl, DO [facility] Pager: [phone] 11/14/2013, 3:12 PM

## 2013-11-20 ENCOUNTER — Telehealth: Payer: Self-pay

## 2013-11-20 NOTE — Telephone Encounter (Signed)
Please tell the person that you see for the coumadin, about the lamisil, before you start or take any more lamisil.

## 2013-11-20 NOTE — Telephone Encounter (Signed)
Patient called asking if medication prescribed for toe fungus (generic for Lamisil) could be taken along with blood thinner.  Please advise,  Thanks!

## 2013-11-20 NOTE — Telephone Encounter (Signed)
Patient informed. 

## 2013-11-22 ENCOUNTER — Ambulatory Visit (INDEPENDENT_AMBULATORY_CARE_PROVIDER_SITE_OTHER): Payer: Medicare Other | Admitting: Endocrinology

## 2013-11-22 ENCOUNTER — Encounter: Payer: Self-pay | Admitting: Endocrinology

## 2013-11-22 VITALS — BP 122/70 | HR 87 | Temp 98.0°F | Ht 75.0 in | Wt 198.0 lb

## 2013-11-22 DIAGNOSIS — Z79899 Other long term (current) drug therapy: Secondary | ICD-10-CM | POA: Diagnosis not present

## 2013-11-22 DIAGNOSIS — D72819 Decreased white blood cell count, unspecified: Secondary | ICD-10-CM

## 2013-11-22 LAB — BASIC METABOLIC PANEL
BUN: 18 mg/dL (ref 6–23)
Calcium: 9.5 mg/dL (ref 8.4–10.5)
Chloride: 104 mEq/L (ref 96–112)
Creatinine, Ser: 1.2 mg/dL (ref 0.4–1.5)
GFR: 77.47 mL/min (ref >60.00)
Glucose, Bld: 84 mg/dL (ref 70–99)
Sodium: 140 mEq/L (ref 135–145)

## 2013-11-22 LAB — CBC WITH DIFFERENTIAL/PLATELET
Basophils Absolute: 0 10*3/uL (ref 0.0–0.1)
Lymphocytes Relative: 32.4 % (ref 12.0–46.0)
Monocytes Relative: 9.3 % (ref 3.0–12.0)
Neutrophils Relative %: 53.4 % (ref 43.0–77.0)
Platelets: 275 10*3/uL (ref 150.0–400.0)
RDW: 13.1 % (ref 11.5–14.6)

## 2013-11-22 MED ORDER — RIVAROXABAN 20 MG PO TABS: 20.0000 mg | ORAL_TABLET | Freq: Every day | ORAL | Status: DC

## 2013-11-22 NOTE — Progress Notes (Signed)
Subjective:    Patient ID: Omar [name], male    DOB: 03-Jan-1946, 67 y.o.   MRN: [id number]  HPI The state of at least three ongoing medical problems is addressed today, with interval history of each noted here: Pt returns for f/u of pulm emboli: he feels much better.  Leukopenia was again noted in the hospital. Renal function should be checked on xarelto. Pt says he may not be able to afford to but xarelto. Past Medical History  Diagnosis Date  . Hyperlipidemia   . GERD (gastroesophageal reflux disease)   . History of colonic polyps   . Decreased white blood cell count   . Prostatism   . BPH (benign prostatic hypertrophy)   . History of kidney stones   . History of rheumatic fever   . History of esophageal stricture 10/31/2007  . Diverticulosis   . Gastric polyps   . Prostate cancer 11/09/2012  . Depression   . Erectile dysfunction     Past Surgical History  Procedure Laterality Date  . Inguinal hernia repair    . Knee arthroscopy      left  . Tonsillectomy    . Carpal tunnel release      Bilateral  . Esophageal dilation      last dilation 8'12  . Robot assisted laparoscopic radical prostatectomy  11/08/2012    Procedure: ROBOTIC ASSISTED LAPAROSCOPIC RADICAL PROSTATECTOMY LEVEL 1;  Surgeon: Milford Cage, MD;  Location: [facility];  Service: Urology;  Laterality: N/A;     . Umbilical hernia repair N/A 04/15/2013    Procedure: HERNIA REPAIR UMBILICAL ADULT;  Surgeon: Adolph Pollack, MD;  Location[facility];  Service: General;  Laterality: N/A;  . Insertion of mesh N/A 04/15/2013    Procedure: INSERTION OF MESH;  Surgeon: Adolph Pollack, MD;  Location: [facility] OR;  Service: General;  Laterality: N/A;    History   Social History  . Marital Status: Married    Spouse Name: N/A    Number of Children: 4  . Years of Education: N/A   Occupational History  . pastor    Social History Main Topics  . Smoking status: Former Smoker    Types: Cigarettes  . Smokeless  tobacco: Former Neurosurgeon    Quit date: 10/30/1994  . Alcohol Use: No  . Drug Use: No  . Sexual Activity: Yes   Other Topics Concern  . Not on file   Social History Narrative  . No narrative on file    Current Outpatient Prescriptions on File Prior to Visit  Medication Sig Dispense Refill  . Avanafil (STENDRA) 100 MG TABS Take by mouth. 1-2 pills as needed for ED symptoms      . esomeprazole (NEXIUM) 40 MG capsule Take 40 mg by mouth daily at 12 noon.      . Misc Natural Products (TOTAL MEMORY & FOCUS FORMULA PO) Take 1 capsule by mouth daily. FOCUS-FACTOR      . simvastatin (ZOCOR) 80 MG tablet Take 1 tablet (80 mg total) by mouth at bedtime.  30 tablet  11  . terbinafine (LAMISIL) 250 MG tablet Take 1 tablet (250 mg total) by mouth daily.  90 tablet  0  . traMADol (ULTRAM) 50 MG tablet Take 2 tablets (100 mg total) by mouth every 6 (six) hours as needed for moderate pain.  40 tablet  0  . [DISCONTINUED] vardenafil (LEVITRA) 20 MG tablet Take 20 mg by mouth as needed.  No current facility-administered medications on file prior to visit.    Allergies  Allergen Reactions  . Sulfonamide Derivatives Other (See Comments)    REACTION: blisters on skin    Family History  Problem Relation Age of Onset  . Ovarian cancer Mother   . Cancer Mother   . Lung cancer Maternal Uncle   . Lung cancer Maternal Aunt   . Colon cancer Neg Hx   . Esophageal cancer Neg Hx   . Stomach cancer Neg Hx   . Rectal cancer Neg Hx   . Stroke Father     BP 122/70  Pulse 87  Temp(Src) 98 F (36.7 C) (Oral)  Ht 6\' 3"  (1.905 m)  Wt 198 lb (89.812 kg)  BMI 24.75 kg/m2  SpO2 98%    Review of Systems Denies chest pain and sob.      Objective:   Physical Exam VITAL SIGNS:  See vs page GENERAL: no distress LUNGS:  Clear to auscultation.  Lab Results  Component Value Date   CREATININE 1.2 11/22/2013   BUN 18 11/22/2013   NA 140 11/22/2013   K 4.0 11/22/2013   CL 104 11/22/2013   CO2 32  11/22/2013   Lab Results  Component Value Date   WBC 3.4* 11/22/2013   HGB 13.8 11/22/2013   HCT 40.5 11/22/2013   MCV 80.3 11/22/2013   PLT 275.0 11/22/2013      Assessment & Plan:  Pulmonary embolism, clinically improved. Leukopenia: stable Mild renal insuff: stable. Economic circumstances: these may limits the rx of PE.

## 2013-11-22 NOTE — Patient Instructions (Addendum)
When you finish 3 weeks of "xarelto," change to 20 mg once daily.  i have sent a prescription to your pharmacy.  If you cannot afford this, we can change to a generic medication.  However, this requires frequent blood tests.    Please come back for a follow-up appointment in 6 months.

## 2013-12-17 DIAGNOSIS — J392 Other diseases of pharynx: Secondary | ICD-10-CM | POA: Diagnosis not present

## 2013-12-17 DIAGNOSIS — J069 Acute upper respiratory infection, unspecified: Secondary | ICD-10-CM | POA: Diagnosis not present

## 2013-12-17 DIAGNOSIS — K219 Gastro-esophageal reflux disease without esophagitis: Secondary | ICD-10-CM | POA: Diagnosis not present

## 2013-12-18 ENCOUNTER — Telehealth: Payer: Self-pay

## 2013-12-18 NOTE — Telephone Encounter (Signed)
No, coumadin is the cheap generic one.  i would be happy to refer you to the coumadin clinic.

## 2013-12-18 NOTE — Telephone Encounter (Signed)
Pt called requesting a change in medication. Pt states he would like to come off of the Xarelto because of the adverse side effects. Also, pt does not want to start Coumadin due to the price. Please advise, Thanks!

## 2013-12-18 NOTE — Telephone Encounter (Signed)
Pt informed and stated that he would just stay on the Xarelto.

## 2014-01-03 ENCOUNTER — Ambulatory Visit (INDEPENDENT_AMBULATORY_CARE_PROVIDER_SITE_OTHER): Payer: Medicare Other | Admitting: Endocrinology

## 2014-01-03 ENCOUNTER — Encounter: Payer: Self-pay | Admitting: Endocrinology

## 2014-01-03 ENCOUNTER — Other Ambulatory Visit: Payer: Self-pay | Admitting: General Practice

## 2014-01-03 VITALS — BP 112/60 | HR 109 | Temp 98.3°F | Ht 75.0 in | Wt 210.0 lb

## 2014-01-03 DIAGNOSIS — N529 Male erectile dysfunction, unspecified: Secondary | ICD-10-CM | POA: Diagnosis not present

## 2014-01-03 DIAGNOSIS — Z7901 Long term (current) use of anticoagulants: Secondary | ICD-10-CM

## 2014-01-03 DIAGNOSIS — H2510 Age-related nuclear cataract, unspecified eye: Secondary | ICD-10-CM | POA: Diagnosis not present

## 2014-01-03 LAB — CBC WITH DIFFERENTIAL/PLATELET
BASOS ABS: 0 10*3/uL (ref 0.0–0.1)
Basophils Relative: 0.3 % (ref 0.0–3.0)
EOS ABS: 0.1 10*3/uL (ref 0.0–0.7)
Eosinophils Relative: 1.3 % (ref 0.0–5.0)
HCT: 39.2 % (ref 39.0–52.0)
Hemoglobin: 12.8 g/dL — ABNORMAL LOW (ref 13.0–17.0)
Lymphocytes Relative: 14.2 % (ref 12.0–46.0)
Lymphs Abs: 1 10*3/uL (ref 0.7–4.0)
MCHC: 32.6 g/dL (ref 30.0–36.0)
MCV: 84.5 fl (ref 78.0–100.0)
Monocytes Absolute: 0.6 10*3/uL (ref 0.1–1.0)
Monocytes Relative: 8.5 % (ref 3.0–12.0)
NEUTROS PCT: 75.7 % (ref 43.0–77.0)
Neutro Abs: 5.1 10*3/uL (ref 1.4–7.7)
Platelets: 248 10*3/uL (ref 150.0–400.0)
RBC: 4.63 Mil/uL (ref 4.22–5.81)
RDW: 14.1 % (ref 11.5–14.6)
WBC: 6.8 10*3/uL (ref 4.5–10.5)

## 2014-01-03 NOTE — Patient Instructions (Addendum)
Take a stool softener, 1 pill, twice a day.   Please continue the xarelto for now.   Refer to a coumadin specialist, to start that medication.   Go to the emergency room for bleeding of more than 1 cup.   blood tests are being requested for you today.  We'll contact you with results.

## 2014-01-03 NOTE — Progress Notes (Signed)
Subjective:    Patient ID: Omar [name], male    DOB: 11-Aug-1946, 68 y.o.   MRN: [id number]  HPI The state of at least three ongoing medical problems is addressed today, with interval history of each noted here:   Pt returns for f/u of pulm emboli (dx'ed dec of 2014).  Sob is resolved Yesterday, he had slight (few tblsp) BRBPR, but no assoc pain.  Colonoscopy in 2009 showed diverticulosis.   Past Medical History  Diagnosis Date  . Hyperlipidemia   . GERD (gastroesophageal reflux disease)   . History of colonic polyps   . Decreased white blood cell count   . Prostatism   . BPH (benign prostatic hypertrophy)   . History of kidney stones   . History of rheumatic fever   . History of esophageal stricture 10/31/2007  . Diverticulosis   . Gastric polyps   . Prostate cancer 11/09/2012  . Depression   . Erectile dysfunction     Past Surgical History  Procedure Laterality Date  . Inguinal hernia repair    . Knee arthroscopy      left  . Tonsillectomy    . Carpal tunnel release      Bilateral  . Esophageal dilation      last dilation 8'12  . Robot assisted laparoscopic radical prostatectomy  11/08/2012    Procedure: ROBOTIC ASSISTED LAPAROSCOPIC RADICAL PROSTATECTOMY LEVEL 1;  Surgeon: Molli Hazard, MD;  Location: [facility];  Service: Urology;  Laterality: N/A;     . Umbilical hernia repair N/A 04/15/2013    Procedure: HERNIA REPAIR UMBILICAL ADULT;  Surgeon: Odis Hollingshead, MD;  Location: [place];  Service: General;  Laterality: N/A;  . Insertion of mesh N/A 04/15/2013    Procedure: INSERTION OF MESH;  Surgeon: Odis Hollingshead, MD;  Location: [place];  Service: General;  Laterality: N/A;    History   Social History  . Marital Status: Married    Spouse Name: N/A    Number of Children: 4  . Years of Education: N/A   Occupational History  . pastor    Social History Main Topics  . Smoking status: Former Smoker    Types: Cigarettes  . Smokeless tobacco: Former Systems developer      Quit date: 10/30/1994  . Alcohol Use: No  . Drug Use: No  . Sexual Activity: Yes   Other Topics Concern  . Not on file   Social History Narrative  . No narrative on file    Current Outpatient Prescriptions on File Prior to Visit  Medication Sig Dispense Refill  . Avanafil (STENDRA) 100 MG TABS Take by mouth. 1-2 pills as needed for ED symptoms      . esomeprazole (NEXIUM) 40 MG capsule Take 40 mg by mouth daily at 12 noon.      . Misc Natural Products (TOTAL MEMORY & FOCUS FORMULA PO) Take 1 capsule by mouth daily. FOCUS-FACTOR      . simvastatin (ZOCOR) 80 MG tablet Take 1 tablet (80 mg total) by mouth at bedtime.  30 tablet  11  . terbinafine (LAMISIL) 250 MG tablet Take 1 tablet (250 mg total) by mouth daily.  90 tablet  0  . traMADol (ULTRAM) 50 MG tablet Take 2 tablets (100 mg total) by mouth every 6 (six) hours as needed for moderate pain.  40 tablet  0  . Rivaroxaban (XARELTO) 20 MG TABS tablet Take 1 tablet (20 mg total) by mouth daily with supper. Start on  12/06/2013  30 tablet  5  . [DISCONTINUED] vardenafil (LEVITRA) 20 MG tablet Take 20 mg by mouth as needed.         No current facility-administered medications on file prior to visit.    Allergies  Allergen Reactions  . Sulfonamide Derivatives Other (See Comments)    REACTION: blisters on skin    Family History  Problem Relation Age of Onset  . Ovarian cancer Mother   . Cancer Mother   . Lung cancer Maternal Uncle   . Lung cancer Maternal Aunt   . Colon cancer Neg Hx   . Esophageal cancer Neg Hx   . Stomach cancer Neg Hx   . Rectal cancer Neg Hx   . Stroke Father     BP 112/60  Pulse 109  Temp(Src) 98.3 F (36.8 C) (Oral)  Ht 6\' 3"  (1.905 m)  Wt 210 lb (95.255 kg)  BMI 26.25 kg/m2  SpO2 97%  Review of Systems Denies cough and abd pain.      Objective:   Physical Exam VITAL SIGNS:  See vs page GENERAL: no distress Rectal: 1 small non-bleeding ext hemorrhoid.  No blood is seen.  Lab Results   Component Value Date   WBC 6.8 01/03/2014   HGB 12.8* 01/03/2014   HCT 39.2 01/03/2014   MCV 84.5 01/03/2014   PLT 248.0 01/03/2014      Assessment & Plan:  BRBPR, mild.  We'll follow this PE's.  He needs to continue anticoagulation Economic circumstances: these are dictating that he change to coumadin.

## 2014-01-07 ENCOUNTER — Ambulatory Visit (INDEPENDENT_AMBULATORY_CARE_PROVIDER_SITE_OTHER): Payer: Medicare Other | Admitting: General Practice

## 2014-01-07 ENCOUNTER — Other Ambulatory Visit: Payer: Self-pay | Admitting: General Practice

## 2014-01-07 DIAGNOSIS — Z5181 Encounter for therapeutic drug level monitoring: Secondary | ICD-10-CM | POA: Diagnosis not present

## 2014-01-07 DIAGNOSIS — I2699 Other pulmonary embolism without acute cor pulmonale: Secondary | ICD-10-CM

## 2014-01-07 LAB — POCT INR: INR: 1

## 2014-01-07 MED ORDER — WARFARIN SODIUM 5 MG PO TABS: ORAL_TABLET | ORAL | Status: DC

## 2014-01-07 NOTE — Progress Notes (Signed)
Pre-visit discussion using our clinic review tool. No additional management support is needed unless otherwise documented below in the visit note.  

## 2014-01-09 ENCOUNTER — Other Ambulatory Visit: Payer: Self-pay | Admitting: General Practice

## 2014-01-14 ENCOUNTER — Ambulatory Visit (INDEPENDENT_AMBULATORY_CARE_PROVIDER_SITE_OTHER): Payer: Medicare Other | Admitting: General Practice

## 2014-01-14 DIAGNOSIS — Z5181 Encounter for therapeutic drug level monitoring: Secondary | ICD-10-CM

## 2014-01-14 DIAGNOSIS — I2699 Other pulmonary embolism without acute cor pulmonale: Secondary | ICD-10-CM

## 2014-01-14 LAB — POCT INR: INR: 1.4

## 2014-01-14 NOTE — Progress Notes (Signed)
Pre-visit discussion using our clinic review tool. No additional management support is needed unless otherwise documented below in the visit note.  

## 2014-01-23 ENCOUNTER — Telehealth: Payer: Self-pay

## 2014-01-23 MED ORDER — TERBINAFINE HCL 250 MG PO TABS: 250.0000 mg | ORAL_TABLET | Freq: Every day | ORAL | Status: DC

## 2014-01-23 NOTE — Telephone Encounter (Signed)
Pt notified. When the prescription was sent in on 12/13/2013 the pharmacy only filled it for 30 days.(called pharmacy to verify) 2 month supply was given.

## 2014-01-23 NOTE — Telephone Encounter (Signed)
Pt called stating that during his visit on 01/03/2014 he was prescribed Lamasil for a toe nail fungus. Pt states that the medication did not clear up the fungus. Wanted to know if something else could be given or if a refill could be given of the Lamasil. Please advise, Thanks!

## 2014-01-23 NOTE — Telephone Encounter (Signed)
You have to take the pill daily for 3 months.  It takes an additional 3 months to work.

## 2014-01-31 ENCOUNTER — Ambulatory Visit (INDEPENDENT_AMBULATORY_CARE_PROVIDER_SITE_OTHER): Payer: Medicare Other | Admitting: General Practice

## 2014-01-31 DIAGNOSIS — Z5181 Encounter for therapeutic drug level monitoring: Secondary | ICD-10-CM | POA: Diagnosis not present

## 2014-01-31 DIAGNOSIS — I2699 Other pulmonary embolism without acute cor pulmonale: Secondary | ICD-10-CM | POA: Diagnosis not present

## 2014-01-31 LAB — POCT INR: INR: 1.6

## 2014-01-31 NOTE — Progress Notes (Signed)
Pre visit review using our clinic review tool, if applicable. No additional management support is needed unless otherwise documented below in the visit note. 

## 2014-02-04 DIAGNOSIS — Z8546 Personal history of malignant neoplasm of prostate: Secondary | ICD-10-CM | POA: Diagnosis not present

## 2014-02-11 DIAGNOSIS — N529 Male erectile dysfunction, unspecified: Secondary | ICD-10-CM | POA: Diagnosis not present

## 2014-02-11 DIAGNOSIS — N393 Stress incontinence (female) (male): Secondary | ICD-10-CM | POA: Diagnosis not present

## 2014-02-11 DIAGNOSIS — Z8546 Personal history of malignant neoplasm of prostate: Secondary | ICD-10-CM | POA: Diagnosis not present

## 2014-02-11 DIAGNOSIS — C61 Malignant neoplasm of prostate: Secondary | ICD-10-CM | POA: Diagnosis not present

## 2014-02-14 ENCOUNTER — Ambulatory Visit (INDEPENDENT_AMBULATORY_CARE_PROVIDER_SITE_OTHER): Payer: Medicare Other | Admitting: General Practice

## 2014-02-14 DIAGNOSIS — I2699 Other pulmonary embolism without acute cor pulmonale: Secondary | ICD-10-CM | POA: Diagnosis not present

## 2014-02-14 DIAGNOSIS — Z5181 Encounter for therapeutic drug level monitoring: Secondary | ICD-10-CM | POA: Diagnosis not present

## 2014-02-14 LAB — POCT INR: INR: 1.6

## 2014-02-14 NOTE — Progress Notes (Signed)
Pre visit review using our clinic review tool, if applicable. No additional management support is needed unless otherwise documented below in the visit note. 

## 2014-02-28 ENCOUNTER — Ambulatory Visit (INDEPENDENT_AMBULATORY_CARE_PROVIDER_SITE_OTHER): Payer: Medicare Other | Admitting: General Practice

## 2014-02-28 ENCOUNTER — Other Ambulatory Visit: Payer: Self-pay | Admitting: General Practice

## 2014-02-28 DIAGNOSIS — Z5181 Encounter for therapeutic drug level monitoring: Secondary | ICD-10-CM | POA: Diagnosis not present

## 2014-02-28 DIAGNOSIS — I2699 Other pulmonary embolism without acute cor pulmonale: Secondary | ICD-10-CM | POA: Diagnosis not present

## 2014-02-28 LAB — POCT INR: INR: 1.7

## 2014-02-28 MED ORDER — WARFARIN SODIUM 5 MG PO TABS: ORAL_TABLET | ORAL | Status: DC

## 2014-02-28 NOTE — Progress Notes (Signed)
Pre visit review using our clinic review tool, if applicable. No additional management support is needed unless otherwise documented below in the visit note. 

## 2014-03-18 ENCOUNTER — Encounter: Payer: Self-pay | Admitting: Endocrinology

## 2014-03-18 ENCOUNTER — Ambulatory Visit (INDEPENDENT_AMBULATORY_CARE_PROVIDER_SITE_OTHER): Payer: Medicare Other | Admitting: Endocrinology

## 2014-03-18 VITALS — BP 128/80 | HR 89 | Temp 98.3°F | Ht 75.0 in | Wt 205.0 lb

## 2014-03-18 DIAGNOSIS — I2510 Atherosclerotic heart disease of native coronary artery without angina pectoris: Secondary | ICD-10-CM | POA: Insufficient documentation

## 2014-03-18 DIAGNOSIS — D3500 Benign neoplasm of unspecified adrenal gland: Secondary | ICD-10-CM

## 2014-03-18 MED ORDER — CEFUROXIME AXETIL 250 MG PO TABS: 250.0000 mg | ORAL_TABLET | Freq: Two times a day (BID) | ORAL | Status: AC

## 2014-03-18 NOTE — Progress Notes (Signed)
Subjective:    Patient ID: Omar [name], male    DOB: 06-08-1946, 68 y.o.   MRN: [id number]  HPI Pt states few weeks of slight pain at the throat, but no assoc nasal congestion.  No earache.  On chest CT, incidental note was made of coronary Ca++.  Denies chest pain.   Past Medical History  Diagnosis Date  . Hyperlipidemia   . GERD (gastroesophageal reflux disease)   . History of colonic polyps   . Decreased white blood cell count   . Prostatism   . BPH (benign prostatic hypertrophy)   . History of kidney stones   . History of rheumatic fever   . History of esophageal stricture 10/31/2007  . Diverticulosis   . Gastric polyps   . Prostate cancer 11/09/2012  . Depression   . Erectile dysfunction     Past Surgical History  Procedure Laterality Date  . Inguinal hernia repair    . Knee arthroscopy      left  . Tonsillectomy    . Carpal tunnel release      Bilateral  . Esophageal dilation      last dilation 8'12  . Robot assisted laparoscopic radical prostatectomy  11/08/2012    Procedure: ROBOTIC ASSISTED LAPAROSCOPIC RADICAL PROSTATECTOMY LEVEL 1;  Surgeon: Molli Hazard, MD;  Location: [facility];  Service: Urology;  Laterality: N/A;     . Umbilical hernia repair N/A 04/15/2013    Procedure: HERNIA REPAIR UMBILICAL ADULT;  Surgeon: Odis Hollingshead, MD;  Location: [place];  Service: General;  Laterality: N/A;  . Insertion of mesh N/A 04/15/2013    Procedure: INSERTION OF MESH;  Surgeon: Odis Hollingshead, MD;  Location: [facility];  Service: General;  Laterality: N/A;    History   Social History  . Marital Status: Married    Spouse Name: N/A    Number of Children: 4  . Years of Education: N/A   Occupational History  . pastor    Social History Main Topics  . Smoking status: Former Smoker    Types: Cigarettes  . Smokeless tobacco: Former Systems developer    Quit date: 10/30/1994  . Alcohol Use: No  . Drug Use: No  . Sexual Activity: Yes   Other Topics Concern  . Not on  file   Social History Narrative  . No narrative on file    Current Outpatient Prescriptions on File Prior to Visit  Medication Sig Dispense Refill  . Avanafil (STENDRA) 100 MG TABS Take by mouth. 1-2 pills as needed for ED symptoms      . esomeprazole (NEXIUM) 40 MG capsule Take 40 mg by mouth daily at 12 noon.      . Misc Natural Products (TOTAL MEMORY & FOCUS FORMULA PO) Take 1 capsule by mouth daily. FOCUS-FACTOR      . simvastatin (ZOCOR) 80 MG tablet Take 1 tablet (80 mg total) by mouth at bedtime.  30 tablet  11  . terbinafine (LAMISIL) 250 MG tablet Take 1 tablet (250 mg total) by mouth daily.  60 tablet  0  . traMADol (ULTRAM) 50 MG tablet Take 2 tablets (100 mg total) by mouth every 6 (six) hours as needed for moderate pain.  40 tablet  0  . warfarin (COUMADIN) 5 MG tablet Take as directed by anticoagulation clinic  45 tablet  3  . [DISCONTINUED] vardenafil (LEVITRA) 20 MG tablet Take 20 mg by mouth as needed.         No  current facility-administered medications on file prior to visit.    Allergies  Allergen Reactions  . Sulfonamide Derivatives Other (See Comments)    REACTION: blisters on skin    Family History  Problem Relation Age of Onset  . Ovarian cancer Mother   . Cancer Mother   . Lung cancer Maternal Uncle   . Lung cancer Maternal Aunt   . Colon cancer Neg Hx   . Esophageal cancer Neg Hx   . Stomach cancer Neg Hx   . Rectal cancer Neg Hx   . Stroke Father     BP 128/80  Pulse 89  Temp(Src) 98.3 F (36.8 C) (Oral)  Ht 6\' 3"  (1.905 m)  Wt 205 lb (92.987 kg)  BMI 25.62 kg/m2  SpO2 98%   Review of Systems Denies fever and cough.      Objective:   Physical Exam VITAL SIGNS:  See vs page GENERAL: no distress head: no deformity eyes: no periorbital swelling, no proptosis external nose and ears are normal mouth: no lesion seen Right tm is red, but the left is normal.     (i reviewed CT result)    Assessment & Plan:  URI: new CAD: new

## 2014-03-18 NOTE — Patient Instructions (Addendum)
i have sent a prescription to your pharmacy, for an antibiotic pill.   Please come back for a "medicare wellness" appointment in 3-4 months.   Refer to a heart specialist.  you will receive a phone call, about a day and time for an appointment.

## 2014-03-28 ENCOUNTER — Ambulatory Visit (INDEPENDENT_AMBULATORY_CARE_PROVIDER_SITE_OTHER): Payer: Medicare Other | Admitting: General Practice

## 2014-03-28 DIAGNOSIS — Z5181 Encounter for therapeutic drug level monitoring: Secondary | ICD-10-CM | POA: Diagnosis not present

## 2014-03-28 DIAGNOSIS — I2699 Other pulmonary embolism without acute cor pulmonale: Secondary | ICD-10-CM

## 2014-03-28 LAB — POCT INR: INR: 1.5

## 2014-03-28 NOTE — Progress Notes (Signed)
Pre visit review using our clinic review tool, if applicable. No additional management support is needed unless otherwise documented below in the visit note. 

## 2014-04-02 ENCOUNTER — Other Ambulatory Visit: Payer: Self-pay

## 2014-04-02 MED ORDER — TERBINAFINE HCL 250 MG PO TABS: 250.0000 mg | ORAL_TABLET | Freq: Every day | ORAL | Status: DC

## 2014-04-11 ENCOUNTER — Ambulatory Visit (INDEPENDENT_AMBULATORY_CARE_PROVIDER_SITE_OTHER): Payer: Medicare Other | Admitting: Family Medicine

## 2014-04-11 DIAGNOSIS — I2699 Other pulmonary embolism without acute cor pulmonale: Secondary | ICD-10-CM

## 2014-04-11 DIAGNOSIS — Z5181 Encounter for therapeutic drug level monitoring: Secondary | ICD-10-CM | POA: Diagnosis not present

## 2014-04-11 LAB — POCT INR: INR: 1.9

## 2014-04-14 ENCOUNTER — Ambulatory Visit: Payer: Self-pay | Admitting: Family Medicine

## 2014-04-14 DIAGNOSIS — I2699 Other pulmonary embolism without acute cor pulmonale: Secondary | ICD-10-CM

## 2014-04-14 DIAGNOSIS — Z5181 Encounter for therapeutic drug level monitoring: Secondary | ICD-10-CM

## 2014-04-14 LAB — POCT INR: INR: 1.9

## 2014-04-23 ENCOUNTER — Ambulatory Visit (INDEPENDENT_AMBULATORY_CARE_PROVIDER_SITE_OTHER): Payer: Medicare Other | Admitting: Endocrinology

## 2014-04-23 ENCOUNTER — Encounter: Payer: Self-pay | Admitting: Endocrinology

## 2014-04-23 ENCOUNTER — Telehealth: Payer: Self-pay

## 2014-04-23 VITALS — BP 130/86 | HR 99 | Temp 100.0°F | Ht 75.0 in | Wt 203.0 lb

## 2014-04-23 DIAGNOSIS — I2510 Atherosclerotic heart disease of native coronary artery without angina pectoris: Secondary | ICD-10-CM | POA: Diagnosis not present

## 2014-04-23 DIAGNOSIS — I2699 Other pulmonary embolism without acute cor pulmonale: Secondary | ICD-10-CM | POA: Diagnosis not present

## 2014-04-23 MED ORDER — CEFUROXIME AXETIL 250 MG PO TABS: 250.0000 mg | ORAL_TABLET | Freq: Two times a day (BID) | ORAL | Status: AC

## 2014-04-23 MED ORDER — BENZONATATE 100 MG PO CAPS: 100.0000 mg | ORAL_CAPSULE | Freq: Three times a day (TID) | ORAL | Status: DC | PRN

## 2014-04-23 NOTE — Telephone Encounter (Signed)
Pt insisted he did not have a cough, but i have sent a prescription to your pharmacy

## 2014-04-23 NOTE — Telephone Encounter (Signed)
Pt's wife called. She states that during the visit the pt forgot to bring up he has had a persistent hacking cough. Wanted to know if something could be prescribed.  Please advise, Thanks

## 2014-04-23 NOTE — Patient Instructions (Addendum)
i have sent a prescription to your pharmacy, for an antibiotic pill. Refer to a lung specialist.  you will receive a phone call, about a day and time for an appointment.

## 2014-04-23 NOTE — Progress Notes (Signed)
Subjective:    Patient ID: Omar [name], male    DOB: [birth year], 68 y.o.   MRN: [id number]  HPI Pt states 6 weeks of burning-quality pain at the throat, but no assoc cough.   Past Medical History  Diagnosis Date  . Hyperlipidemia   . GERD (gastroesophageal reflux disease)   . History of colonic polyps   . Decreased white blood cell count   . Prostatism   . BPH (benign prostatic hypertrophy)   . History of kidney stones   . History of rheumatic fever   . History of esophageal stricture 10/31/2007  . Diverticulosis   . Gastric polyps   . Prostate cancer 11/09/2012  . Depression   . Erectile dysfunction     Past Surgical History  Procedure Laterality Date  . Inguinal hernia repair    . Knee arthroscopy      left  . Tonsillectomy    . Carpal tunnel release      Bilateral  . Esophageal dilation      last dilation 8'12  . Robot assisted laparoscopic radical prostatectomy  11/08/2012    Procedure: ROBOTIC ASSISTED LAPAROSCOPIC RADICAL PROSTATECTOMY LEVEL 1;  Surgeon: Molli Hazard, MD;  Location: [facility];  Service: Urology;  Laterality: N/A;     . Umbilical hernia repair N/A 04/15/2013    Procedure: HERNIA REPAIR UMBILICAL ADULT;  Surgeon: Odis Hollingshead, MD;  Location: [facility];  Service: General;  Laterality: N/A;  . Insertion of mesh N/A 04/15/2013    Procedure: INSERTION OF MESH;  Surgeon: Odis Hollingshead, MD;  Location: [facility];  Service: General;  Laterality: N/A;    History   Social History  . Marital Status: Married    Spouse Name: N/A    Number of Children: 4  . Years of Education: N/A   Occupational History  . pastor    Social History Main Topics  . Smoking status: Former Smoker    Types: Cigarettes  . Smokeless tobacco: Former Systems developer    Quit date: 10/30/1994  . Alcohol Use: No  . Drug Use: No  . Sexual Activity: Yes   Other Topics Concern  . Not on file   Social History Narrative  . No narrative on file    Current Outpatient Prescriptions  on File Prior to Visit  Medication Sig Dispense Refill  . Avanafil (STENDRA) 100 MG TABS Take by mouth. 1-2 pills as needed for ED symptoms      . esomeprazole (NEXIUM) 40 MG capsule Take 40 mg by mouth daily at 12 noon.      . Misc Natural Products (TOTAL MEMORY & FOCUS FORMULA PO) Take 1 capsule by mouth daily. FOCUS-FACTOR      . simvastatin (ZOCOR) 80 MG tablet Take 1 tablet (80 mg total) by mouth at bedtime.  30 tablet  11  . terbinafine (LAMISIL) 250 MG tablet Take 1 tablet (250 mg total) by mouth daily.  60 tablet  0  . warfarin (COUMADIN) 5 MG tablet Take as directed by anticoagulation clinic  45 tablet  3  . [DISCONTINUED] vardenafil (LEVITRA) 20 MG tablet Take 20 mg by mouth as needed.         No current facility-administered medications on file prior to visit.    Allergies  Allergen Reactions  . Sulfonamide Derivatives Other (See Comments)    REACTION: blisters on skin    Family History  Problem Relation Age of Onset  . Ovarian cancer Mother   .  Cancer Mother   . Lung cancer Maternal Uncle   . Lung cancer Maternal Aunt   . Colon cancer Neg Hx   . Esophageal cancer Neg Hx   . Stomach cancer Neg Hx   . Rectal cancer Neg Hx   . Stroke Father     BP 130/86  Pulse 99  Temp(Src) 100 F (37.8 C) (Oral)  Ht 6\' 3"  (1.905 m)  Wt 203 lb (92.08 kg)  BMI 25.37 kg/m2  SpO2 96%   Review of Systems He has a low-grade temp, but no earache.      Objective:   Physical Exam VITAL SIGNS:  See vs page GENERAL: no distress head: no deformity eyes: no periorbital swelling, no proptosis external nose and ears are normal mouth: no lesion seen Both tm's are red LUNGS:  Clear to auscultation   i have reviewed the records in epic from other provider(s).       Assessment & Plan:  URI: new pulm emboli: ? Duration of anticoagulation. Anticoagulation: this is considered on choice of an antibiotic  Patient Instructions  i have sent a prescription to your pharmacy, for an  antibiotic pill. Refer to a lung specialist.  you will receive a phone call, about a day and time for an appointment.

## 2014-04-24 NOTE — Progress Notes (Signed)
HPI Patient is a 68 yo who was referred for cardiac evaluation The patient is followed by [facility] December was admitted to [facility].  Found to have a PE.  CT scan showed arterial calcifications. The patien is doing much better than he was in December.  He denies CP  No SOB  Is relatively active.  Says he has appt with pulm to discuss [place] term anticoagulation.     Allergies  Allergen Reactions  . Sulfonamide Derivatives Other (See Comments)    REACTION: blisters on skin    Current Outpatient Prescriptions  Medication Sig Dispense Refill  . Avanafil (STENDRA) 100 MG TABS Take by mouth. 1-2 pills as needed for ED symptoms      . benzonatate (TESSALON) 100 MG capsule Take 1 capsule (100 mg total) by mouth 3 (three) times daily as needed for cough.  30 capsule  1  . cefUROXime (CEFTIN) 250 MG tablet Take 1 tablet (250 mg total) by mouth 2 (two) times daily.  14 tablet  0  . esomeprazole (NEXIUM) 40 MG capsule Take 40 mg by mouth daily at 12 noon.      . Misc Natural Products (TOTAL MEMORY & FOCUS FORMULA PO) Take 1 capsule by mouth daily. FOCUS-FACTOR      . simvastatin (ZOCOR) 80 MG tablet Take 1 tablet (80 mg total) by mouth at bedtime.  30 tablet  11  . terbinafine (LAMISIL) 250 MG tablet Take 1 tablet (250 mg total) by mouth daily.  60 tablet  0  . warfarin (COUMADIN) 5 MG tablet Take as directed by anticoagulation clinic  45 tablet  3  . [DISCONTINUED] vardenafil (LEVITRA) 20 MG tablet Take 20 mg by mouth as needed.         No current facility-administered medications for this visit.    Past Medical History  Diagnosis Date  . Hyperlipidemia   . GERD (gastroesophageal reflux disease)   . History of colonic polyps   . Decreased white blood cell count   . Prostatism   . BPH (benign prostatic hypertrophy)   . History of kidney stones   . History of rheumatic fever   . History of esophageal stricture 10/31/2007  . Diverticulosis   . Gastric polyps   . Prostate cancer  11/09/2012  . Depression   . Erectile dysfunction     Past Surgical History  Procedure Laterality Date  . Inguinal hernia repair    . Knee arthroscopy      left  . Tonsillectomy    . Carpal tunnel release      Bilateral  . Esophageal dilation      last dilation 8'12  . Robot assisted laparoscopic radical prostatectomy  11/08/2012    Procedure: ROBOTIC ASSISTED LAPAROSCOPIC RADICAL PROSTATECTOMY LEVEL 1;  Surgeon: Molli Hazard, MD;  Location: [facility];  Service: Urology;  Laterality: N/A;     . Umbilical hernia repair N/A 04/15/2013    Procedure: HERNIA REPAIR UMBILICAL ADULT;  Surgeon: Odis Hollingshead, MD;  Location: [facility];  Service: General;  Laterality: N/A;  . Insertion of mesh N/A 04/15/2013    Procedure: INSERTION OF MESH;  Surgeon: Odis Hollingshead, MD;  Location: [place];  Service: General;  Laterality: N/A;    Family History  Problem Relation Age of Onset  . Ovarian cancer Mother   . Cancer Mother   . Lung cancer Maternal Uncle   . Lung cancer Maternal Aunt   . Colon cancer Neg Hx   .  Esophageal cancer Neg Hx   . Stomach cancer Neg Hx   . Rectal cancer Neg Hx   . Stroke Father     History   Social History  . Marital Status: Married    Spouse Name: N/A    Number of Children: 4  . Years of Education: N/A   Occupational History  . pastor    Social History Main Topics  . Smoking status: Former Smoker    Types: Cigarettes  . Smokeless tobacco: Former Systems developer    Quit date: 10/30/1994  . Alcohol Use: No  . Drug Use: No  . Sexual Activity: Yes   Other Topics Concern  . Not on file   Social History Narrative  . No narrative on file    Review of Systems:  All systems reviewed.  They are negative to the above problem except as previously stated.  Vital Signs: BP 139/84  Pulse 76  Ht 6\' 3"  (1.905 m)  Wt 202 lb 12.8 oz (91.989 kg)  BMI 25.35 kg/m2  Physical Exam [place] is in NAD   HEENT:  Normocephalic, atraumatic. EOMI, PERRLA.  Neck: JVP is  normal.  No bruits.  Lungs: clear to auscultation. No rales no wheezes.  Heart: Regular rate and rhythm. Normal S1, S2. No S3.   No significant murmurs. PMI not displaced.  Abdomen:  Supple, nontender. Normal bowel sounds. No masses. No hepatomegaly.  Extremities:   Good distal pulses throughout. No lower extremity edema.  Musculoskeletal :moving all extremities.  Neuro:   alert and oriented x3.  CN II-XII grossly intact.   Assessment and Plan: 1.  CAD  Patient with CT in December that showed coronary calcifications.  NO symptoms of angina.though  Active.  I would not schedule further testing now.  Recomm he stay active.  2.  HL  Will check lipids today.    3.  PE  Patient on coumadin.  Check CBC  F/U 1 year.  [place] if develops symptoms.

## 2014-04-25 ENCOUNTER — Encounter: Payer: Self-pay | Admitting: Internal Medicine

## 2014-04-25 ENCOUNTER — Ambulatory Visit (INDEPENDENT_AMBULATORY_CARE_PROVIDER_SITE_OTHER): Payer: Medicare Other | Admitting: Internal Medicine

## 2014-04-25 ENCOUNTER — Ambulatory Visit (INDEPENDENT_AMBULATORY_CARE_PROVIDER_SITE_OTHER): Payer: Medicare Other | Admitting: General Practice

## 2014-04-25 VITALS — BP 139/84 | HR 76 | Ht 75.0 in | Wt 202.8 lb

## 2014-04-25 DIAGNOSIS — I2510 Atherosclerotic heart disease of native coronary artery without angina pectoris: Secondary | ICD-10-CM | POA: Diagnosis not present

## 2014-04-25 DIAGNOSIS — I2699 Other pulmonary embolism without acute cor pulmonale: Secondary | ICD-10-CM

## 2014-04-25 DIAGNOSIS — Z5181 Encounter for therapeutic drug level monitoring: Secondary | ICD-10-CM

## 2014-04-25 LAB — CBC WITH DIFFERENTIAL/PLATELET
Basophils Absolute: 0 10*3/uL (ref 0.0–0.1)
Basophils Relative: 0.9 % (ref 0.0–3.0)
EOS PCT: 6.7 % — AB (ref 0.0–5.0)
Eosinophils Absolute: 0.2 10*3/uL (ref 0.0–0.7)
HEMATOCRIT: 37.9 % — AB (ref 39.0–52.0)
Hemoglobin: 12.4 g/dL — ABNORMAL LOW (ref 13.0–17.0)
Lymphocytes Relative: 35.3 % (ref 12.0–46.0)
Lymphs Abs: 0.9 10*3/uL (ref 0.7–4.0)
MCHC: 32.7 g/dL (ref 30.0–36.0)
MCV: 78.5 fl (ref 78.0–100.0)
MONOS PCT: 9.7 % (ref 3.0–12.0)
Monocytes Absolute: 0.3 10*3/uL (ref 0.1–1.0)
NEUTROS PCT: 47.4 % (ref 43.0–77.0)
Neutro Abs: 1.2 10*3/uL — ABNORMAL LOW (ref 1.4–7.7)
Platelets: 201 10*3/uL (ref 150.0–400.0)
RBC: 4.83 Mil/uL (ref 4.22–5.81)
RDW: 14.6 % (ref 11.5–15.5)
WBC: 2.6 10*3/uL — AB (ref 4.0–10.5)

## 2014-04-25 LAB — LIPID PANEL
CHOLESTEROL: 163 mg/dL (ref 0–200)
HDL: 37.3 mg/dL — ABNORMAL LOW (ref >39.00)
LDL CALC: 105 mg/dL — AB (ref 0–99)
TRIGLYCERIDES: 106 mg/dL (ref 0.0–149.0)
Total CHOL/HDL Ratio: 4
VLDL: 21.2 mg/dL (ref 0.0–40.0)

## 2014-04-25 LAB — HEPATIC FUNCTION PANEL
ALBUMIN: 3.4 g/dL — AB (ref 3.5–5.2)
ALT: 24 U/L (ref 0–53)
AST: 17 U/L (ref 0–37)
Alkaline Phosphatase: 60 U/L (ref 39–117)
Bilirubin, Direct: 0.1 mg/dL (ref 0.0–0.3)
TOTAL PROTEIN: 6.3 g/dL (ref 6.0–8.3)
Total Bilirubin: 0.6 mg/dL (ref 0.2–1.2)

## 2014-04-25 LAB — POCT INR: INR: 2.3

## 2014-04-25 NOTE — Progress Notes (Signed)
Pre visit review using our clinic review tool, if applicable. No additional management support is needed unless otherwise documented below in the visit note. 

## 2014-04-25 NOTE — Patient Instructions (Signed)
LAB WORK TODAY; CBC W/DIFF, LIPIDS, LFT  Your physician wants you to follow-up in: [place]. You will receive a reminder letter in the mail two months in advance. If you don't receive a letter, please call our office to schedule the follow-up appointment.

## 2014-05-02 ENCOUNTER — Other Ambulatory Visit: Payer: Self-pay | Admitting: *Deleted

## 2014-05-02 DIAGNOSIS — D649 Anemia, unspecified: Secondary | ICD-10-CM

## 2014-05-02 DIAGNOSIS — E785 Hyperlipidemia, unspecified: Secondary | ICD-10-CM

## 2014-05-02 MED ORDER — ROSUVASTATIN CALCIUM 20 MG PO TABS: 10.0000 mg | ORAL_TABLET | Freq: Every day | ORAL | Status: DC

## 2014-05-08 DIAGNOSIS — C61 Malignant neoplasm of prostate: Secondary | ICD-10-CM | POA: Diagnosis not present

## 2014-05-23 DIAGNOSIS — N393 Stress incontinence (female) (male): Secondary | ICD-10-CM | POA: Diagnosis not present

## 2014-05-23 DIAGNOSIS — N529 Male erectile dysfunction, unspecified: Secondary | ICD-10-CM | POA: Diagnosis not present

## 2014-05-23 DIAGNOSIS — Z8546 Personal history of malignant neoplasm of prostate: Secondary | ICD-10-CM | POA: Diagnosis not present

## 2014-05-27 ENCOUNTER — Ambulatory Visit (INDEPENDENT_AMBULATORY_CARE_PROVIDER_SITE_OTHER): Payer: Medicare Other | Admitting: Family Medicine

## 2014-05-27 DIAGNOSIS — I2699 Other pulmonary embolism without acute cor pulmonale: Secondary | ICD-10-CM

## 2014-05-27 DIAGNOSIS — Z5181 Encounter for therapeutic drug level monitoring: Secondary | ICD-10-CM | POA: Diagnosis not present

## 2014-05-27 LAB — POCT INR: INR: 2.2

## 2014-06-04 ENCOUNTER — Ambulatory Visit (INDEPENDENT_AMBULATORY_CARE_PROVIDER_SITE_OTHER): Payer: Medicare Other | Admitting: Internal Medicine

## 2014-06-04 ENCOUNTER — Encounter: Payer: Self-pay | Admitting: Internal Medicine

## 2014-06-04 VITALS — BP 140/88 | HR 82 | Temp 98.1°F | Ht 75.0 in | Wt 204.0 lb

## 2014-06-04 DIAGNOSIS — I2699 Other pulmonary embolism without acute cor pulmonale: Secondary | ICD-10-CM

## 2014-06-04 DIAGNOSIS — I2510 Atherosclerotic heart disease of native coronary artery without angina pectoris: Secondary | ICD-10-CM | POA: Diagnosis not present

## 2014-06-04 NOTE — Progress Notes (Signed)
Subjective:    Patient ID: Omar [name], Omar [name], male    DOB: July 12, 1946 MRN: [id number]  HPI  68 ybm quit smoking  Around 1995 with dx of PE 11/13/13   Admit date: 11/13/2013  Discharge date: 11/14/2013  Discharge Diagnoses:  DEPRESSION  Prostate cancer  Right-sided chest wall pain  Pulmonary embolus  Pulmonary embolism  Hyperlipidemia   Pulmonary embolus  -The patient's prostate cancer is his major risk factor  -Given the suggestion of pulmonary infarction as well as right heart strain, the patient was started on intravenous heparin  -After discussion, the patient preferred to take rivaroxaban. Heparin drip was ultimately stopped and pt started on Xarelto  Prostate cancer  -In remission/stable  -Patient follows Dr. Jasmine December  Hyperlipidemia  -Continue Zocor  Chest discomfort  -Likely due to the patient's pulmonary embolus  -EKG negative for ST-T wave changes  -cycling Troponins--neg x 3  Discharge Condition: Stable  Disposition: discharge home  Diet: Heart healthy  Wt Readings from Last 3 Encounters:   11/13/13  89.313 kg (196 lb 14.4 oz)   11/12/13  89.812 kg (198 lb)   11/07/13  92.08 kg (203 lb)   History of present illness:  68 year old male with a history of hyperlipidemia, prostate cancer, GERD presents with one-week history of chest discomfort and intermittent shortness of breath. The patient states that on 11/06/2013 he had right-sided chest discomfort that was pleuritic in nature. He came to the emergency department at that time. Chest x-ray showed bibasilar atelectasis/opacities. The patient was sent home with azithromycin with which he was compliant. Unfortunately, the patient continued to have intermittent chest discomfort and dyspnea on exertion. He went to see his urologist on 11/11/2013. He was deemed to be medically stable from his prostate cancer standpoint. He went to see his primary care provider on 11/12/2013. A CT angiogram of the chest was scheduled for  11/13/2013. His primary care provider called him with results suggestive of pulmonary embolus, and the patient was advised to come to the emergency department. There is no family history of thromboembolism, recent [place] travels, surgeries, or trauma. The patient had some subjective chills over the weekend prior to admission. He denies any headache, visual disturbance, dizziness, syncope, nausea, vomiting, diarrhea, abdominal pain, dysuria, hematuria. There is no hemoptysis, hematemesis, hematochezia, hematuria.  In the emergency department, the patient was dynamically stable without any tachycardia. He was 100% oxygen saturation on room air. BMP showed serum creatinine 1.24. CBC was unremarkable. INR 0.95. D-dimer was 0.89. CT angiogram the chest showed bilateral pulmonary emboli in the left lower lobe and right upper lobe. There was a suggestion of possible right heart strain as well as a wedge-shaped consolidation suggestive of a pulmonary infarction.  The patient was started on a heparin drip to 2 concerns of pulmonary infarction and right ventricular strain. Echocardiogram was performed and showed normal right ventricular function without any collapse. Left ventricular ejection fraction 65-70% with grade 1 diastolic dysfunction. There was no wall motion abnormalities. Troponins were negative x3. EKG shows sinus rhythm without any ST-T wave changes. The patient remained hemodynamically stable and afebrile. He was not tachycardic. He was not hypoxemic. The patient was started back on his home medications. His heparin drip was discontinued. The patient was started on rivaroxaban. Risks, benefits, and alternatives of anticoagulation were discussed with the patient. He expressed understanding and agreed to follow treatment protocols. He preferred to take rivaroxaban over vitamin K antagonist. The patient was started on rivaroxaban 15 mg twice  a day for 21 days. He was then started on 20 mg daily. The patient will  need followup with his primary care provider for further monitoring.  06/04/2014 1st [facility] office visit/ Kaveri Perras  Chief Complaint  Patient presents with  . Pulmonary Consult    Referred per Dr. Renato Shin for eval of PE.  Pt dxed with PE 11/13/13.    he reports his last check up with urology showed no prostate ca at all. He has h/o [place] car rides but none immediately before his PE. Presently Not limited by breathing from desired activities    No obvious other patterns in day to day or daytime variabilty or assoc chronic cough or cp or chest tightness, subjective wheeze or overt sinus  symptoms. No unusual exp hx or h/o childhood pna/ asthma or knowledge of premature birth.  Sleeping ok without nocturnal  or early am exacerbation  of respiratory  c/o's or need for noct saba. Also denies any obvious fluctuation of symptoms with weather or environmental changes or other aggravating or alleviating factors except as outlined above   Current Medications, Allergies, Complete Past Medical History, Past Surgical History, Family History, and Social History were reviewed in [employer] record.             Review of Systems  Constitutional: Negative for fever, chills, activity change, appetite change and unexpected weight change.  HENT: Positive for sneezing and sore throat. Negative for congestion, dental problem, postnasal drip, rhinorrhea, trouble swallowing and voice change.   Eyes: Negative for visual disturbance.  Respiratory: Negative for cough, choking and shortness of breath.   Cardiovascular: Negative for chest pain and leg swelling.  Gastrointestinal: Negative for nausea, vomiting and abdominal pain.  Genitourinary: Negative for difficulty urinating.       Heartburn  Musculoskeletal: Negative for arthralgias.  Skin: Negative for rash.  Psychiatric/Behavioral: Negative for behavioral problems and confusion.       Objective:   Physical Exam amb wm  nad  Wt Readings from Last 3 Encounters:  06/04/14 204 lb (92.534 kg)  04/25/14 202 lb 12.8 oz (91.989 kg)  04/23/14 203 lb (92.08 kg)      HEENT: nl dentition, turbinates, and orophanx. Nl external ear canals without cough reflex   NECK :  without JVD/Nodes/TM/ nl carotid upstrokes bilaterally   LUNGS: no acc muscle use, clear to A and P bilaterally without cough on insp or exp maneuvers   CV:  RRR  no s3 or murmur or increase in P2, no edema   ABD:  soft and nontender with nl excursion in the supine position. No bruits or organomegaly, bowel sounds nl  MS:  warm without deformities, calf tenderness, cyanosis or clubbing  SKIN: warm and dry without lesions    NEURO:  alert, approp, no deficits    Venous doppler 06/05/14 No evidence of lower extremity deep venous thrombus or incompetence, bilaterally. 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        Assessment & Plan:

## 2014-06-04 NOTE — Patient Instructions (Signed)
Please see patient coordinator before you leave today  to schedule venous dopplers and if negative ok to stop the coumadin but return here 2 weeks afterward for recheck.

## 2014-06-05 ENCOUNTER — Ambulatory Visit (HOSPITAL_COMMUNITY): Payer: Medicare Other | Attending: Cardiology | Admitting: Cardiology

## 2014-06-05 DIAGNOSIS — Z86718 Personal history of other venous thrombosis and embolism: Secondary | ICD-10-CM

## 2014-06-05 DIAGNOSIS — I2699 Other pulmonary embolism without acute cor pulmonale: Secondary | ICD-10-CM | POA: Insufficient documentation

## 2014-06-05 DIAGNOSIS — E785 Hyperlipidemia, unspecified: Secondary | ICD-10-CM | POA: Diagnosis not present

## 2014-06-05 DIAGNOSIS — I8003 Phlebitis and thrombophlebitis of superficial vessels of lower extremities, bilateral: Secondary | ICD-10-CM

## 2014-06-05 DIAGNOSIS — Z7901 Long term (current) use of anticoagulants: Secondary | ICD-10-CM | POA: Diagnosis not present

## 2014-06-05 DIAGNOSIS — I8000 Phlebitis and thrombophlebitis of superficial vessels of unspecified lower extremity: Secondary | ICD-10-CM | POA: Insufficient documentation

## 2014-06-05 NOTE — Progress Notes (Signed)
Bilateral lower extremity venous duplex performed 

## 2014-06-06 NOTE — Assessment & Plan Note (Addendum)
CTa 11/13/14 1. Examination is positive for bilateral pulmonary embolism,  primarily involving the left lower and right upper lobes. While the  overall clot burden is deemed small, there are CT findings  indicative of right-sided heart strain (including a RV / LV ratio of  1) suggestive of sub massive pulmonary embolism.  2. Geographic slightly wedge-shaped consolidative opacities within  the right costophrenic angle are nonspecific though in the setting  of pulmonary embolism are suggestive of an area of pulmonary  infarction. Otherwise, no focal airspace opacities.  3. Coronary artery calcifications - Echo 11/14/14 - Right ventricle: Systolic function was normal. - Right atrium: Central venous pressure: 25mm Hg (est). - Tricuspid valve: Trivial regurgitation. - Pulmonary arteries: Systolic pressure could not be accurately estimated. - Pericardium, extracardiac: There was no pericardial effusion. Impressions: - No prior study for comparison. Mild LVH with LVEF 10-25%, grade 1 diastolic dysfunction. MAC with trivial mitral regurgitation. Mildly sclerotic aortic valve. Normal RV size and contraction. Unable to assess PASP. - Venous dopplers 06/05/14  No evidence of lower extremity deep venous thrombus or incompetence, bilaterally.  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  Main risk factors for recurrence are prostate ca and relative sedentary lifestyle with occasional [place] car trips  If in fact his prostate ca is in complete remission, I think it's reasonable to switch to asa 325 mg daily but return for hypercoagulability profile after 2 weeks off coumadin

## 2014-06-09 NOTE — Progress Notes (Signed)
Quick Note:  Spoke with pt and notified of results per Dr. Wert. Pt verbalized understanding and denied any questions.  ______ 

## 2014-06-13 ENCOUNTER — Telehealth: Payer: Self-pay | Admitting: Endocrinology

## 2014-06-13 MED ORDER — SIMVASTATIN 80 MG PO TABS: 80.0000 mg | ORAL_TABLET | Freq: Every day | ORAL | Status: DC

## 2014-06-13 NOTE — Telephone Encounter (Signed)
Ok, i have sent a prescription to your pharmacy  

## 2014-06-13 NOTE — Telephone Encounter (Signed)
Patient stated that he is having bad side effects with this his crestor meds, and would like to be put back on simvastatin.  Please advise

## 2014-06-13 NOTE — Telephone Encounter (Signed)
See below, ok to change back? Thanks!

## 2014-06-13 NOTE — Telephone Encounter (Signed)
Pt advised via voicemail. 

## 2014-06-19 ENCOUNTER — Encounter: Payer: Self-pay | Admitting: Internal Medicine

## 2014-06-19 ENCOUNTER — Ambulatory Visit (INDEPENDENT_AMBULATORY_CARE_PROVIDER_SITE_OTHER): Payer: Medicare Other | Admitting: Internal Medicine

## 2014-06-19 ENCOUNTER — Other Ambulatory Visit: Payer: Medicare Other

## 2014-06-19 VITALS — BP 132/80 | HR 72 | Temp 98.1°F | Ht 75.0 in | Wt 205.0 lb

## 2014-06-19 DIAGNOSIS — I2699 Other pulmonary embolism without acute cor pulmonale: Secondary | ICD-10-CM

## 2014-06-19 DIAGNOSIS — I2510 Atherosclerotic heart disease of native coronary artery without angina pectoris: Secondary | ICD-10-CM | POA: Diagnosis not present

## 2014-06-19 NOTE — Patient Instructions (Addendum)
Aspirin = 325 mg one daily with bfast  Avoid [place] trips/ crossing legs  Please remember to go to the lab   department downstairs for your tests - we will call you with the results when they are available.  Pulmonary follow up is as needed

## 2014-06-19 NOTE — Progress Notes (Signed)
Subjective:    Patient ID: Omar [name], Birr, male    DOB: 1946/08/01 MRN: [id number]  HPI  68 ybm quit smoking  Around 1995 with dx of PE 11/13/13   Admit date: 11/13/2013  Discharge date: 11/14/2013  Discharge Diagnoses:  DEPRESSION  Prostate cancer  Right-sided chest wall pain  Pulmonary embolus  Pulmonary embolism  Hyperlipidemia   Pulmonary embolus  -The patient's prostate cancer is his major risk factor  -Given the suggestion of pulmonary infarction as well as right heart strain, the patient was started on intravenous heparin  -After discussion, the patient preferred to take rivaroxaban. Heparin drip was ultimately stopped and pt started on Xarelto  Prostate cancer  -In remission/stable  -Patient follows Dr. Jasmine December  Hyperlipidemia  -Continue Zocor  Chest discomfort  -Likely due to the patient's pulmonary embolus  -EKG negative for ST-T wave changes  -cycling Troponins--neg x 3  Discharge Condition: Stable  Disposition: discharge home  Diet: Heart healthy  Wt Readings from Last 3 Encounters:   11/13/13  89.313 kg (196 lb 14.4 oz)   11/12/13  89.812 kg (198 lb)   11/07/13  92.08 kg (203 lb)   History of present illness:  68 year old male with a history of hyperlipidemia, prostate cancer, GERD presents with one-week history of chest discomfort and intermittent shortness of breath. The patient states that on 11/06/2013 he had right-sided chest discomfort that was pleuritic in nature. He came to the emergency department at that time. Chest x-ray showed bibasilar atelectasis/opacities. The patient was sent home with azithromycin with which he was compliant. Unfortunately, the patient continued to have intermittent chest discomfort and dyspnea on exertion. He went to see his urologist on 11/11/2013. He was deemed to be medically stable from his prostate cancer standpoint. He went to see his primary care provider on 11/12/2013. A CT angiogram of the chest was scheduled for  11/13/2013. His primary care provider called him with results suggestive of pulmonary embolus, and the patient was advised to come to the emergency department. There is no family history of thromboembolism, recent [place] travels, surgeries, or trauma. The patient had some subjective chills over the weekend prior to admission. He denies any headache, visual disturbance, dizziness, syncope, nausea, vomiting, diarrhea, abdominal pain, dysuria, hematuria. There is no hemoptysis, hematemesis, hematochezia, hematuria.  In the emergency department, the patient was dynamically stable without any tachycardia. He was 100% oxygen saturation on room air. BMP showed serum creatinine 1.24. CBC was unremarkable. INR 0.95. D-dimer was 0.89. CT angiogram the chest showed bilateral pulmonary emboli in the left lower lobe and right upper lobe. There was a suggestion of possible right heart strain as well as a wedge-shaped consolidation suggestive of a pulmonary infarction.  The patient was started on a heparin drip to 2 concerns of pulmonary infarction and right ventricular strain. Echocardiogram was performed and showed normal right ventricular function without any collapse. Left ventricular ejection fraction 65-70% with grade 1 diastolic dysfunction. There was no wall motion abnormalities. Troponins were negative x3. EKG shows sinus rhythm without any ST-T wave changes. The patient remained hemodynamically stable and afebrile. He was not tachycardic. He was not hypoxemic. The patient was started back on his home medications. His heparin drip was discontinued. The patient was started on rivaroxaban. Risks, benefits, and alternatives of anticoagulation were discussed with the patient. He expressed understanding and agreed to follow treatment protocols. He preferred to take rivaroxaban over vitamin K antagonist. The patient was started on rivaroxaban 15 mg twice  a day for 21 days. He was then started on 20 mg daily. The patient will  need followup with his primary care provider for further monitoring.  06/04/2014 1st [facility] office visit/ Toby Ayad  Chief Complaint  Patient presents with  . Pulmonary Consult    Referred per Dr. Renato Shin for eval of PE.  Pt dxed with PE 11/13/13.    he reports his last check up with urology showed no prostate ca at all. He has h/o [place] car rides but none immediately before his PE. Presently Not limited by breathing from desired activities   rec Please see patient coordinator before you leave today  to schedule venous dopplers> neg except for saphenous thrombosis So rec d/c coumadin     06/19/2014 f/u ov/Roddie Riegler re:  Chief Complaint  Patient presents with  . Follow-up    Pt states that he is overall doing well. Stopped warfarin approx 1 wk ago. No new co's today.     .Not limited by breathing from desired activities    No obvious other patterns in day to day or daytime variabilty or assoc chronic cough or cp or chest tightness, subjective wheeze or overt sinus  symptoms. No unusual exp hx or h/o childhood pna/ asthma or knowledge of premature birth.  Sleeping ok without nocturnal  or early am exacerbation  of respiratory  c/o's or need for noct saba. Also denies any obvious fluctuation of symptoms with weather or environmental changes or other aggravating or alleviating factors except as outlined above   Current Medications, Allergies, Complete Past Medical History, Past Surgical History, Family History, and Social History were reviewed in [employer] record.                   Objective:   Physical Exam amb wm nad   06/19/2014       Wt Readings from Last 3 Encounters:  06/04/14 204 lb (92.534 kg)  04/25/14 202 lb 12.8 oz (91.989 kg)  04/23/14 203 lb (92.08 kg)      HEENT: nl dentition, turbinates, and orophanx. Nl external ear canals without cough reflex   NECK :  without JVD/Nodes/TM/ nl carotid upstrokes bilaterally   LUNGS: no acc  muscle use, clear to A and P bilaterally without cough on insp or exp maneuvers   CV:  RRR  no s3 or murmur or increase in P2, no edema   ABD:  soft and nontender with nl excursion in the supine position. No bruits or organomegaly, bowel sounds nl  MS:  warm without deformities, calf tenderness, cyanosis or clubbing  SKIN: warm and dry without lesions    NEURO:  alert, approp, no deficits    Venous doppler 06/05/14 No evidence of lower extremity deep venous thrombus or incompetence, bilaterally. 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        Assessment & Plan:

## 2014-06-21 ENCOUNTER — Encounter: Payer: Self-pay | Admitting: Internal Medicine

## 2014-06-21 NOTE — Assessment & Plan Note (Addendum)
CTa 11/13/14 1. Examination is positive for bilateral pulmonary embolism,  primarily involving the left lower and right upper lobes. While the  overall clot burden is deemed small, there are CT findings  indicative of right-sided heart strain (including a RV / LV ratio of  1) suggestive of sub massive pulmonary embolism.  2. Geographic slightly wedge-shaped consolidative opacities within  the right costophrenic angle are nonspecific though in the setting  of pulmonary embolism are suggestive of an area of pulmonary  infarction. Otherwise, no focal airspace opacities.  3. Coronary artery calcifications - Echo 11/14/14 - Right ventricle: Systolic function was normal. - Right atrium: Central venous pressure: 40mm Hg (est). - Tricuspid valve: Trivial regurgitation. - Pulmonary arteries: Systolic pressure could not be accurately estimated. - Pericardium, extracardiac: There was no pericardial effusion. Impressions: - No prior study for comparison. Mild LVH with LVEF 88-91%, grade 1 diastolic dysfunction. MAC with trivial mitral regurgitation. Mildly sclerotic aortic valve. Normal RV size and contraction. Unable to assess PASP. - Venous dopplers 06/05/14  No evidence of lower extremity deep venous thrombus or incompetence, bilaterally.  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

## 2014-06-24 ENCOUNTER — Encounter: Payer: Self-pay | Admitting: Internal Medicine

## 2014-06-24 LAB — HYPERCOAGULABLE PANEL, COMPREHENSIVE
ANTICARDIOLIPIN IGG: 4 GPL U/mL (ref <23)
AntiThromb III Func: 108 % (ref 76–126)
Anticardiolipin IgA: 1 APL U/mL (ref <22)
Anticardiolipin IgM: 0 MPL U/mL (ref <11)
Beta-2 Glyco I IgG: 0 G Units (ref <20)
Beta-2-Glycoprotein I IgA: 6 A Units (ref <20)
Beta-2-Glycoprotein I IgM: 1 M Units (ref <20)
DRVVT: 33.5 s (ref <42.9)
LUPUS ANTICOAGULANT: NOT DETECTED
PTT Lupus Anticoagulant: 32.7 secs (ref 28.0–43.0)
Protein C Activity: 126 % (ref 75–133)
Protein C, Total: 87 % (ref 72–160)
Protein S Activity: 59 % — ABNORMAL LOW (ref 69–129)
Protein S Total: 73 % (ref 60–150)

## 2014-06-25 ENCOUNTER — Telehealth: Payer: Self-pay | Admitting: Internal Medicine

## 2014-06-25 NOTE — Progress Notes (Signed)
Quick Note:  Patient returned call. Advised of lab results / recs as stated by MW. Pt verbalized understanding and denied any questions. ______

## 2014-06-25 NOTE — Progress Notes (Signed)
Quick Note:  LMTCB ______ 

## 2014-06-25 NOTE — Telephone Encounter (Signed)
Pt returned call.  Spoke with patient and advised of lab results / recs as stated by MW:  Notes Recorded by Rosana Berger, CMA on 06/25/2014 at 10:42 AM LMTCB Notes Recorded by Tanda Rockers, MD on 06/24/2014 at 8:46 PM Call patient : Studies are unremarkable, no change in recs - no evidence clotting disorder  Pt verbalized his understanding and denied any questions.  Nothing further needed; will sign off.

## 2014-06-25 NOTE — Telephone Encounter (Signed)
Attempted to call x1 lmtcb

## 2014-06-30 ENCOUNTER — Ambulatory Visit (INDEPENDENT_AMBULATORY_CARE_PROVIDER_SITE_OTHER): Payer: Medicare Other | Admitting: Internal Medicine

## 2014-06-30 ENCOUNTER — Encounter: Payer: Self-pay | Admitting: Internal Medicine

## 2014-06-30 VITALS — BP 132/74 | HR 80 | Ht 74.0 in | Wt 204.0 lb

## 2014-06-30 DIAGNOSIS — I2510 Atherosclerotic heart disease of native coronary artery without angina pectoris: Secondary | ICD-10-CM

## 2014-06-30 DIAGNOSIS — I2699 Other pulmonary embolism without acute cor pulmonale: Secondary | ICD-10-CM

## 2014-06-30 NOTE — Patient Instructions (Signed)
Continue Aspirin = 325 mg one daily with bfast  Avoid [place] trips/ crossing legs  Pulmonary follow up is as needed

## 2014-06-30 NOTE — Progress Notes (Signed)
Subjective:    Patient ID: Omar [name], Omar [name], male    DOB: [birth year] MRN: [id number]    Brief patient profile:  54 ybm quit smoking  Around 1995 with dx of PE 11/13/13   Admit date: 11/13/2013  Discharge date: 11/14/2013  Discharge Diagnoses:  DEPRESSION  Prostate cancer  Right-sided chest wall pain  Pulmonary embolus  Pulmonary embolism  Hyperlipidemia   Pulmonary embolus  -The patient's prostate cancer is his major risk factor  -Given the suggestion of pulmonary infarction as well as right heart strain, the patient was started on intravenous heparin  -After discussion, the patient preferred to take rivaroxaban. Heparin drip was ultimately stopped and pt started on Xarelto  Prostate cancer  -In remission/stable  -Patient follows Dr. Jasmine [name]  Hyperlipidemia  -Continue Zocor  Chest discomfort  -Likely due to the patient's pulmonary embolus  -EKG negative for ST-T wave changes  -cycling Troponins--neg x 3  Discharge Condition: Stable  Disposition: discharge home  Diet: Heart healthy  Wt Readings from Last 3 Encounters:   11/13/13  89.313 kg (196 lb 14.4 oz)   11/12/13  89.812 kg (198 lb)   11/07/13  92.08 kg (203 lb)   History of present illness:  68 year old male with a history of hyperlipidemia, prostate cancer, GERD presents with one-week history of chest discomfort and intermittent shortness of breath. The patient states that on 11/06/2013 he had right-sided chest discomfort that was pleuritic in nature. He came to the emergency department at that time. Chest x-ray showed bibasilar atelectasis/opacities. The patient was sent home with azithromycin with which he was compliant. Unfortunately, the patient continued to have intermittent chest discomfort and dyspnea on exertion. He went to see his urologist on 11/11/2013. He was deemed to be medically stable from his prostate cancer standpoint. He went to see his primary care provider on 11/12/2013. A CT angiogram of the  chest was scheduled for 11/13/2013. His primary care provider called him with results suggestive of pulmonary embolus, and the patient was advised to come to the emergency department. There is no family history of thromboembolism, recent [place] travels, surgeries, or trauma. The patient had some subjective chills over the weekend prior to admission. He denies any headache, visual disturbance, dizziness, syncope, nausea, vomiting, diarrhea, abdominal pain, dysuria, hematuria. There is no hemoptysis, hematemesis, hematochezia, hematuria.  In the emergency department, the patient was dynamically stable without any tachycardia. He was 100% oxygen saturation on room air. BMP showed serum creatinine 1.24. CBC was unremarkable. INR 0.95. D-dimer was 0.89. CT angiogram the chest showed bilateral pulmonary emboli in the left lower lobe and right upper lobe. There was a suggestion of possible right heart strain as well as a wedge-shaped consolidation suggestive of a pulmonary infarction.  The patient was started on a heparin drip to 2 concerns of pulmonary infarction and right ventricular strain. Echocardiogram was performed and showed normal right ventricular function without any collapse. Left ventricular ejection fraction 65-70% with grade 1 diastolic dysfunction. There was no wall motion abnormalities. Troponins were negative x3. EKG shows sinus rhythm without any ST-T wave changes. The patient remained hemodynamically stable and afebrile. He was not tachycardic. He was not hypoxemic. The patient was started back on his home medications. His heparin drip was discontinued. The patient was started on rivaroxaban. Risks, benefits, and alternatives of anticoagulation were discussed with the patient. He expressed understanding and agreed to follow treatment protocols. He preferred to take rivaroxaban over vitamin K antagonist. The patient was started on  rivaroxaban 15 mg twice a day for 21 days. He was then started on 20 mg  daily. The patient will need followup with his primary care provider for further monitoring.  06/04/2014 1st [facility] office visit/ Omar [name]  Chief Complaint  Patient presents with  . Pulmonary Consult    Referred per Omar [name] for eval of PE.  Pt dxed with PE 11/13/13.    he reports his last check up with urology showed no prostate ca at all. He has h/o [place] car rides but none immediately before his PE. Presently Not limited by breathing from desired activities   rec Please see patient coordinator before you leave today  to schedule venous dopplers> neg except for saphenous thrombosis So rec d/c coumadin     06/19/2014 f/u ov/Omar [name] re: post coumadin f/u for labs Chief Complaint  Patient presents with  . Follow-up    Pt states that he is overall doing well. Stopped warfarin approx 1 wk ago. No new co's today.    Not limited by breathing from desired activities   Rec Aspirin = 325 mg one daily with bfast Avoid [place] trips/ crossing legs    06/30/2014 f/u ov/Omar [name] re:  Final PE f/u   Chief Complaint  Patient presents with  . Follow-up    pt c/o SOB with exertion, no other complaints at this time.   no change mild doe, really Not limited by breathing from desired activities    No obvious day to day or daytime variabilty or assoc chronic cough or cp or chest tightness, subjective wheeze overt sinus or hb symptoms. No unusual exp hx or h/o childhood pna/ asthma or knowledge of premature birth.  Sleeping ok without nocturnal  or early am exacerbation  of respiratory  c/o's or need for noct saba. Also denies any obvious fluctuation of symptoms with weather or environmental changes or other aggravating or alleviating factors except as outlined above   Current Medications, Allergies, Complete Past Medical History, Past Surgical History, Family History, and Social History were reviewed in [employer] [facility] record.  ROS  The following are not active complaints unless  bolded sore throat, dysphagia, dental problems, itching, sneezing,  nasal congestion or excess/ purulent secretions, ear ache,   fever, chills, sweats, unintended wt loss, pleuritic or exertional cp, hemoptysis,  orthopnea pnd or leg swelling, presyncope, palpitations, heartburn, abdominal pain, anorexia, nausea, vomiting, diarrhea  or change in bowel or urinary habits, change in stools or urine, dysuria,hematuria,  rash, arthralgias, visual complaints, headache, numbness weakness or ataxia or problems with walking or coordination,  change in mood/affect or memory.                        Objective:   Physical Exam amb wm nad   06/19/2014       204  > 06/30/2014   Wt Readings from Last 3 Encounters:  06/04/14 204 lb (92.534 kg)  04/25/14 202 lb 12.8 oz (91.989 kg)  04/23/14 203 lb (92.08 kg)      HEENT: nl dentition, turbinates, and orophanx. Nl external ear canals without cough reflex   NECK :  without JVD/Nodes/TM/ nl carotid upstrokes bilaterally   LUNGS: no acc muscle use, clear to A and P bilaterally without cough on insp or exp maneuvers   CV:  RRR  no s3 or murmur or increase in P2, no edema   ABD:  soft and nontender with nl excursion in the supine position.  No bruits or organomegaly, bowel sounds nl  MS:  warm without deformities, calf tenderness, cyanosis or clubbing  SKIN: warm and dry without lesions    NEURO:  alert, approp, no deficits    Venous doppler 06/05/14 No evidence of lower extremity deep venous thrombus or incompetence, bilaterally. 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        Assessment & Plan:

## 2014-06-30 NOTE — Assessment & Plan Note (Addendum)
CTa 11/13/14 1. Examination is positive for bilateral pulmonary embolism,  primarily involving the left lower and right upper lobes. While the  overall clot burden is deemed small, there are CT findings  indicative of right-sided heart strain (including a RV / LV ratio of  1) suggestive of sub massive pulmonary embolism.  2. Geographic slightly wedge-shaped consolidative opacities within  the right costophrenic angle are nonspecific though in the setting  of pulmonary embolism are suggestive of an area of pulmonary  infarction. Otherwise, no focal airspace opacities.  3. Coronary artery calcifications - Echo 11/14/14 - Right ventricle: Systolic function was normal. - Right atrium: Central venous pressure: 31mm Hg (est). - Tricuspid valve: Trivial regurgitation. - Pulmonary arteries: Systolic pressure could not be accurately estimated. - Pericardium, extracardiac: There was no pericardial effusion. Impressions: - No prior study for comparison. Mild LVH with LVEF 62-95%, grade 1 diastolic dysfunction. MAC with trivial mitral regurgitation. Mildly sclerotic aortic valve. Normal RV size and contraction. Unable to assess PASP. - Venous dopplers 06/05/14  No evidence of lower extremity deep venous thrombus or incompetence, bilaterally.  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

## 2014-07-01 ENCOUNTER — Ambulatory Visit (INDEPENDENT_AMBULATORY_CARE_PROVIDER_SITE_OTHER): Payer: Medicare Other | Admitting: Internal Medicine

## 2014-07-01 ENCOUNTER — Encounter: Payer: Self-pay | Admitting: Internal Medicine

## 2014-07-01 VITALS — BP 122/78 | HR 75 | Temp 98.2°F | Ht 75.0 in | Wt 204.0 lb

## 2014-07-01 DIAGNOSIS — N509 Disorder of male genital organs, unspecified: Secondary | ICD-10-CM

## 2014-07-01 DIAGNOSIS — F3289 Other specified depressive episodes: Secondary | ICD-10-CM | POA: Diagnosis not present

## 2014-07-01 DIAGNOSIS — I2699 Other pulmonary embolism without acute cor pulmonale: Secondary | ICD-10-CM

## 2014-07-01 DIAGNOSIS — R1031 Right lower quadrant pain: Secondary | ICD-10-CM | POA: Diagnosis not present

## 2014-07-01 DIAGNOSIS — I2510 Atherosclerotic heart disease of native coronary artery without angina pectoris: Secondary | ICD-10-CM | POA: Diagnosis not present

## 2014-07-01 DIAGNOSIS — N50811 Right testicular pain: Secondary | ICD-10-CM

## 2014-07-01 DIAGNOSIS — F329 Major depressive disorder, single episode, unspecified: Secondary | ICD-10-CM

## 2014-07-01 MED ORDER — AMITRIPTYLINE HCL 50 MG PO TABS: 50.0000 mg | ORAL_TABLET | Freq: Every evening | ORAL | Status: DC | PRN

## 2014-07-01 NOTE — Progress Notes (Signed)
Subjective:    Patient ID: Omar Carbon., male    DOB: 12-04-1946, 69 y.o.   MRN: [id number]  HPI    Here to  F/u, overall doing ok but with increased rlq/right side abd pain with radiation to the right testicle for 3 days, gradually worse, without n/v, fever, other abd pain, radiation, bowel change or blood. Pt denies chest pain, increased sob or doe, wheezing, orthopnea, PND, increased LE swelling, palpitations, dizziness or syncope.  Pt denies new neurological symptoms such as new headache, or facial or extremity weakness but with worsening several months of recurrent feet burning discomfort worse at night to try to sleep.   Has hx of kidney stone x 10-12 yrs ago. S/p prostatectomy , and later hernia surgury.  Denies worsening depressive symptoms, suicidal ideation, or panic Past Medical History  Diagnosis Date  . Hyperlipidemia   . GERD (gastroesophageal reflux disease)   . History of colonic polyps   . Decreased white blood cell count   . Prostatism   . BPH (benign prostatic hypertrophy)   . History of kidney stones   . History of rheumatic fever   . History of esophageal stricture 10/31/2007  . Diverticulosis   . Gastric polyps   . Prostate cancer 11/09/2012  . Depression   . Erectile dysfunction    Past Surgical History  Procedure Laterality Date  . Inguinal hernia repair    . Knee arthroscopy      left  . Tonsillectomy    . Carpal tunnel release      Bilateral  . Esophageal dilation      last dilation 8'12  . Robot assisted laparoscopic radical prostatectomy  11/08/2012    Procedure: ROBOTIC ASSISTED LAPAROSCOPIC RADICAL PROSTATECTOMY LEVEL 1;  Surgeon: Molli Hazard, MD;  Location: [facility];  Service: Urology;  Laterality: N/A;     . Umbilical hernia repair N/A 04/15/2013    Procedure: HERNIA REPAIR UMBILICAL ADULT;  Surgeon: Odis Hollingshead, MD;  Location: [facility];  Service: General;  Laterality: N/A;  . Insertion of mesh N/A 04/15/2013    Procedure: INSERTION  OF MESH;  Surgeon: Odis Hollingshead, MD;  Location: [facility];  Service: General;  Laterality: N/A;    reports that he quit smoking about 20 years ago. His smoking use included Cigarettes. He has a 25 pack-year smoking history. He has never used smokeless tobacco. He reports that he does not drink alcohol or use illicit drugs. family history includes Cancer in his mother; Lung cancer in his maternal aunt and maternal uncle; Ovarian cancer in his mother; Stroke in his father. There is no history of Colon cancer, Esophageal cancer, Stomach cancer, or Rectal cancer. Allergies  Allergen Reactions  . Sulfonamide Derivatives Other (See Comments)    REACTION: blisters on skin   Current Outpatient Prescriptions on File Prior to Visit  Medication Sig Dispense Refill  . aspirin 325 MG tablet Take 325 mg by mouth daily.      Marland Kitchen esomeprazole (NEXIUM) 40 MG capsule Take 40 mg by mouth daily at 12 noon.      . simvastatin (ZOCOR) 80 MG tablet Take 1 tablet (80 mg total) by mouth daily.  30 tablet  11  . [DISCONTINUED] vardenafil (LEVITRA) 20 MG tablet Take 20 mg by mouth as needed.         No current facility-administered medications on file prior to visit.      Review of Systems  Constitutional: Negative for unusual diaphoresis  or other sweats  HENT: Negative for ringing in ear Eyes: Negative for double vision or worsening visual disturbance.  Respiratory: Negative for choking and stridor.   Gastrointestinal: Negative for vomiting or other signifcant bowel change Genitourinary: Negative for hematuria or decreased urine volume.  Musculoskeletal: Negative for other MSK pain or swelling Skin: Negative for color change and worsening wound.  Neurological: Negative for tremors and numbness other than noted  Psychiatric/Behavioral: Negative for decreased concentration or agitation other than above       Objective:   Physical Exam BP 122/78  Pulse 75  Temp(Src) 98.2 F (36.8 C) (Oral)  Ht 6\' 3"  (1.905  m)  Wt 204 lb (92.534 kg)  BMI 25.50 kg/m2  SpO2 98% VS noted,  Constitutional: Pt appears well-developed, well-nourished.  HENT: Head: NCAT.  Right Ear: External ear normal.  Left Ear: External ear normal.  Eyes: . Pupils are equal, round, and reactive to light. Conjunctivae and EOM are normal Neck: Normal range of motion. Neck supple.  Cardiovascular: Normal rate and regular rhythm.   Pulmonary/Chest: Effort normal and breath sounds normal.  Abd:  Soft, NT, ND, + BS, no flank tender GU: normal penis, no d/c, testicles NT, no swelling Neurological: Pt is alert. Not confused , motor grossly intact Skin: Skin is warm. No rash Psychiatric: Pt behavior is normal. No agitation. not depressed affect    Assessment & Plan:

## 2014-07-01 NOTE — Progress Notes (Signed)
Pre visit review using our clinic review tool, if applicable. No additional management support is needed unless otherwise documented below in the visit note. 

## 2014-07-01 NOTE — Patient Instructions (Signed)
Please take all new medication as prescribed - the generic elavil at night for pain  Please go to the XRAY Department in the Basement (go straight as you get off the elevator) for the x-ray testing  Please go to the LAB in the Basement (turn left off the elevator) for the tests to be done tomorrow - just the urine testing  Please continue all other medications as before, and refills have been done if requested.  Please keep your appointments with your specialists as you may have planned  You will be contacted regarding the referral for: Urology

## 2014-07-02 ENCOUNTER — Other Ambulatory Visit (INDEPENDENT_AMBULATORY_CARE_PROVIDER_SITE_OTHER): Payer: Medicare Other

## 2014-07-02 ENCOUNTER — Ambulatory Visit (INDEPENDENT_AMBULATORY_CARE_PROVIDER_SITE_OTHER)
Admission: RE | Admit: 2014-07-02 | Discharge: 2014-07-02 | Disposition: A | Discharge status: Home / Self Care | Payer: Medicare Other | Source: Ambulatory Visit | Attending: Internal Medicine | Admitting: Internal Medicine

## 2014-07-02 ENCOUNTER — Encounter: Payer: Self-pay | Admitting: Internal Medicine

## 2014-07-02 DIAGNOSIS — R1031 Right lower quadrant pain: Secondary | ICD-10-CM

## 2014-07-02 DIAGNOSIS — N509 Disorder of male genital organs, unspecified: Secondary | ICD-10-CM | POA: Diagnosis not present

## 2014-07-02 DIAGNOSIS — N50811 Right testicular pain: Secondary | ICD-10-CM

## 2014-07-02 LAB — URINALYSIS, ROUTINE W REFLEX MICROSCOPIC
Bilirubin Urine: NEGATIVE
Hgb urine dipstick: NEGATIVE
Ketones, ur: NEGATIVE
Leukocytes, UA: NEGATIVE
NITRITE: NEGATIVE
RBC / HPF: NONE SEEN (ref 0–?)
Specific Gravity, Urine: <=1.005 — AB (ref 1.000–1.030)
Total Protein, Urine: NEGATIVE
Urine Glucose: NEGATIVE
Urobilinogen, UA: 0.2 (ref 0.0–1.0)
pH: 6 (ref 5.0–8.0)

## 2014-07-03 LAB — URINE CULTURE
COLONY COUNT: NO GROWTH
Organism ID, Bacteria: NO GROWTH

## 2014-07-04 DIAGNOSIS — R1031 Right lower quadrant pain: Secondary | ICD-10-CM | POA: Insufficient documentation

## 2014-07-04 DIAGNOSIS — N50811 Right testicular pain: Secondary | ICD-10-CM | POA: Insufficient documentation

## 2014-07-04 NOTE — Assessment & Plan Note (Signed)
Exam benign, afeb, with recurring subjective pain without other GI or GU symptoms, ? Renal stone with hx of stone previous; for UA, culture, KUB, consider u/s, refer urology

## 2014-07-04 NOTE — Assessment & Plan Note (Signed)
stable overall by history and exam, and pt to continue medical treatment as before,  to f/u any worsening symptoms or concerns 

## 2014-07-04 NOTE — Assessment & Plan Note (Addendum)
Now on ASA only, stable, to f/u any worsening symptoms or concerns

## 2014-07-04 NOTE — Assessment & Plan Note (Signed)
Exam benign, for eval as above,  to f/u any worsening symptoms or concerns

## 2014-07-08 ENCOUNTER — Telehealth: Payer: Self-pay

## 2014-07-08 NOTE — Telephone Encounter (Signed)
Called the patient left a message to call back. 

## 2014-07-08 NOTE — Telephone Encounter (Signed)
Please call pt back.

## 2014-07-08 NOTE — Telephone Encounter (Signed)
Message copied by Jamesetta Orleans on Tue Jul 08, 2014 11:04 AM ------      Message from: Biagio Borg      Created: Fri Jul 04, 2014  5:41 PM      Regarding: recent abd pain       Robin to contact pt; with recent UA neg, culture neg, KUB neg except for constipation, I think he does not really need to see urology, unless he wants to for other concerns (I think he mentioned some BPH like symptoms at last visit); we can cancel his appt if he likes ------

## 2014-07-08 NOTE — Telephone Encounter (Signed)
Patient informed of results and ok with the patient to cancel appointment with urologist.

## 2014-07-08 NOTE — Telephone Encounter (Signed)
Ok to forward to [place] and [place]

## 2014-07-09 NOTE — Telephone Encounter (Signed)
Canceled office visit for today, 07/09/14, with Dr. Diona Fanti. Per [facility], patient has other appointments scheduled. Left message for patient to return my call. If he would like to cancel the other appointments, he will need to call [facility] himself as we did not make those appointments for him.

## 2014-07-17 ENCOUNTER — Encounter (HOSPITAL_COMMUNITY): Payer: Self-pay | Admitting: Emergency Medicine

## 2014-07-17 ENCOUNTER — Ambulatory Visit (INDEPENDENT_AMBULATORY_CARE_PROVIDER_SITE_OTHER): Payer: Medicare Other | Admitting: Endocrinology

## 2014-07-17 ENCOUNTER — Emergency Department (HOSPITAL_COMMUNITY): Payer: Medicare Other

## 2014-07-17 ENCOUNTER — Encounter: Payer: Self-pay | Admitting: Endocrinology

## 2014-07-17 VITALS — BP 130/90 | HR 90 | Temp 97.5°F | Ht 75.0 in | Wt 202.0 lb

## 2014-07-17 DIAGNOSIS — E785 Hyperlipidemia, unspecified: Secondary | ICD-10-CM | POA: Diagnosis not present

## 2014-07-17 DIAGNOSIS — R079 Chest pain, unspecified: Secondary | ICD-10-CM | POA: Diagnosis not present

## 2014-07-17 DIAGNOSIS — F329 Major depressive disorder, single episode, unspecified: Secondary | ICD-10-CM

## 2014-07-17 DIAGNOSIS — Z79899 Other long term (current) drug therapy: Secondary | ICD-10-CM | POA: Diagnosis not present

## 2014-07-17 DIAGNOSIS — K219 Gastro-esophageal reflux disease without esophagitis: Secondary | ICD-10-CM | POA: Diagnosis not present

## 2014-07-17 DIAGNOSIS — I2510 Atherosclerotic heart disease of native coronary artery without angina pectoris: Secondary | ICD-10-CM | POA: Insufficient documentation

## 2014-07-17 DIAGNOSIS — Z23 Encounter for immunization: Secondary | ICD-10-CM

## 2014-07-17 DIAGNOSIS — Z87891 Personal history of nicotine dependence: Secondary | ICD-10-CM | POA: Insufficient documentation

## 2014-07-17 DIAGNOSIS — Z86711 Personal history of pulmonary embolism: Secondary | ICD-10-CM | POA: Insufficient documentation

## 2014-07-17 DIAGNOSIS — R0609 Other forms of dyspnea: Principal | ICD-10-CM | POA: Insufficient documentation

## 2014-07-17 DIAGNOSIS — K222 Esophageal obstruction: Secondary | ICD-10-CM | POA: Diagnosis not present

## 2014-07-17 DIAGNOSIS — C61 Malignant neoplasm of prostate: Secondary | ICD-10-CM | POA: Diagnosis not present

## 2014-07-17 DIAGNOSIS — R202 Paresthesia of skin: Secondary | ICD-10-CM

## 2014-07-17 DIAGNOSIS — D72819 Decreased white blood cell count, unspecified: Secondary | ICD-10-CM | POA: Diagnosis not present

## 2014-07-17 DIAGNOSIS — Z8546 Personal history of malignant neoplasm of prostate: Secondary | ICD-10-CM | POA: Insufficient documentation

## 2014-07-17 DIAGNOSIS — I10 Essential (primary) hypertension: Secondary | ICD-10-CM | POA: Diagnosis not present

## 2014-07-17 DIAGNOSIS — R0989 Other specified symptoms and signs involving the circulatory and respiratory systems: Secondary | ICD-10-CM | POA: Diagnosis not present

## 2014-07-17 DIAGNOSIS — R0602 Shortness of breath: Secondary | ICD-10-CM | POA: Diagnosis not present

## 2014-07-17 DIAGNOSIS — F3289 Other specified depressive episodes: Secondary | ICD-10-CM

## 2014-07-17 DIAGNOSIS — R209 Unspecified disturbances of skin sensation: Secondary | ICD-10-CM

## 2014-07-17 DIAGNOSIS — Z7982 Long term (current) use of aspirin: Secondary | ICD-10-CM | POA: Diagnosis not present

## 2014-07-17 DIAGNOSIS — R200 Anesthesia of skin: Secondary | ICD-10-CM

## 2014-07-17 LAB — CBC WITH DIFFERENTIAL/PLATELET
BASOS PCT: 0.9 % (ref 0.0–3.0)
Basophils Absolute: 0 10*3/uL (ref 0.0–0.1)
EOS ABS: 0.2 10*3/uL (ref 0.0–0.7)
EOS PCT: 7.4 % — AB (ref 0.0–5.0)
HCT: 38.8 % — ABNORMAL LOW (ref 39.0–52.0)
HEMOGLOBIN: 12.8 g/dL — AB (ref 13.0–17.0)
LYMPHS PCT: 35.7 % (ref 12.0–46.0)
Lymphs Abs: 1 10*3/uL (ref 0.7–4.0)
MCHC: 33 g/dL (ref 30.0–36.0)
MCV: 79.6 fl (ref 78.0–100.0)
Monocytes Absolute: 0.3 10*3/uL (ref 0.1–1.0)
Monocytes Relative: 10.4 % (ref 3.0–12.0)
NEUTROS ABS: 1.2 10*3/uL — AB (ref 1.4–7.7)
Neutrophils Relative %: 45.6 % (ref 43.0–77.0)
Platelets: 184 10*3/uL (ref 150.0–400.0)
RBC: 4.88 Mil/uL (ref 4.22–5.81)
RDW: 15.1 % (ref 11.5–15.5)
WBC: 2.7 10*3/uL — ABNORMAL LOW (ref 4.0–10.5)

## 2014-07-17 LAB — LIPID PANEL
CHOL/HDL RATIO: 3
Cholesterol: 154 mg/dL (ref 0–200)
HDL: 44.4 mg/dL (ref >39.00)
LDL CALC: 98 mg/dL (ref 0–99)
NONHDL: 109.6
Triglycerides: 58 mg/dL (ref 0.0–149.0)
VLDL: 11.6 mg/dL (ref 0.0–40.0)

## 2014-07-17 LAB — BASIC METABOLIC PANEL
ANION GAP: 11 (ref 5–15)
BUN: 14 mg/dL (ref 6–23)
BUN: 15 mg/dL (ref 6–23)
CALCIUM: 9.3 mg/dL (ref 8.4–10.5)
CHLORIDE: 106 meq/L (ref 96–112)
CHLORIDE: 103 meq/L (ref 96–112)
CO2: 29 mEq/L (ref 19–32)
CO2: 27 mEq/L (ref 19–32)
Calcium: 9.1 mg/dL (ref 8.4–10.5)
Creatinine, Ser: 1.2 mg/dL (ref 0.4–1.5)
Creatinine, Ser: 1.36 mg/dL — ABNORMAL HIGH (ref 0.50–1.35)
GFR calc Af Amer: 60 mL/min — ABNORMAL LOW (ref >90)
GFR calc non Af Amer: 52 mL/min — ABNORMAL LOW (ref >90)
GFR: 75.15 mL/min (ref >60.00)
Glucose, Bld: 85 mg/dL (ref 70–99)
Glucose, Bld: 88 mg/dL (ref 70–99)
Potassium: 4 mEq/L (ref 3.5–5.1)
Potassium: 4.4 mEq/L (ref 3.7–5.3)
Sodium: 140 mEq/L (ref 135–145)
Sodium: 141 mEq/L (ref 137–147)

## 2014-07-17 LAB — CBC
HCT: 40.4 % (ref 39.0–52.0)
Hemoglobin: 13.2 g/dL (ref 13.0–17.0)
MCH: 26.6 pg (ref 26.0–34.0)
MCHC: 32.7 g/dL (ref 30.0–36.0)
MCV: 81.5 fL (ref 78.0–100.0)
PLATELETS: 183 10*3/uL (ref 150–400)
RBC: 4.96 MIL/uL (ref 4.22–5.81)
RDW: 14 % (ref 11.5–15.5)
WBC: 3.8 10*3/uL — ABNORMAL LOW (ref 4.0–10.5)

## 2014-07-17 LAB — URINALYSIS, ROUTINE W REFLEX MICROSCOPIC
BILIRUBIN URINE: NEGATIVE
HGB URINE DIPSTICK: NEGATIVE
KETONES UR: NEGATIVE
Leukocytes, UA: NEGATIVE
NITRITE: NEGATIVE
PH: 7 (ref 5.0–8.0)
Specific Gravity, Urine: 1.015 (ref 1.000–1.030)
Total Protein, Urine: NEGATIVE
URINE GLUCOSE: NEGATIVE
Urobilinogen, UA: 1 (ref 0.0–1.0)

## 2014-07-17 LAB — PRO B NATRIURETIC PEPTIDE: PRO B NATRI PEPTIDE: 26.8 pg/mL (ref 0–125)

## 2014-07-17 LAB — HEPATIC FUNCTION PANEL
ALT: 15 U/L (ref 0–53)
AST: 18 U/L (ref 0–37)
Albumin: 3.8 g/dL (ref 3.5–5.2)
Alkaline Phosphatase: 52 U/L (ref 39–117)
BILIRUBIN DIRECT: 0.1 mg/dL (ref 0.0–0.3)
Total Bilirubin: 0.7 mg/dL (ref 0.2–1.2)
Total Protein: 6 g/dL (ref 6.0–8.3)

## 2014-07-17 LAB — PSA: PSA: 0 ng/mL — AB (ref 0.10–4.00)

## 2014-07-17 LAB — TSH: TSH: 1.18 u[IU]/mL (ref 0.35–4.50)

## 2014-07-17 LAB — I-STAT TROPONIN, ED: TROPONIN I, POC: 0 ng/mL (ref 0.00–0.08)

## 2014-07-17 NOTE — Progress Notes (Signed)
Subjective:    Patient ID: Omar [name]., male    DOB: 10-Jan-1946, 68 y.o.   MRN: [id number]  HPI Pt states 10 days of slight numbness of the RUE, but no assoc pain.  Past Medical History  Diagnosis Date  . Hyperlipidemia   . GERD (gastroesophageal reflux disease)   . History of colonic polyps   . Decreased white blood cell count   . Prostatism   . BPH (benign prostatic hypertrophy)   . History of kidney stones   . History of rheumatic fever   . History of esophageal stricture 10/31/2007  . Diverticulosis   . Gastric polyps   . Prostate cancer 11/09/2012  . Depression   . Erectile dysfunction     Past Surgical History  Procedure Laterality Date  . Inguinal hernia repair    . Knee arthroscopy      left  . Tonsillectomy    . Carpal tunnel release      Bilateral  . Esophageal dilation      last dilation 8'12  . Robot assisted laparoscopic radical prostatectomy  11/08/2012    Procedure: ROBOTIC ASSISTED LAPAROSCOPIC RADICAL PROSTATECTOMY LEVEL 1;  Surgeon: Molli Hazard, MD;  Location: [facility];  Service: Urology;  Laterality: N/A;     . Umbilical hernia repair N/A 04/15/2013    Procedure: HERNIA REPAIR UMBILICAL ADULT;  Surgeon: Odis Hollingshead, MD;  Location: [place];  Service: General;  Laterality: N/A;  . Insertion of mesh N/A 04/15/2013    Procedure: INSERTION OF MESH;  Surgeon: Odis Hollingshead, MD;  Location: [place];  Service: General;  Laterality: N/A;    History   Social History  . Marital Status: Married    Spouse Name: N/A    Number of Children: 4  . Years of Education: N/A   Occupational History  . pastor    Social History Main Topics  . Smoking status: Former Smoker -- 1.00 packs/day for 25 years    Types: Cigarettes    Quit date: 12/05/1993  . Smokeless tobacco: Never Used  . Alcohol Use: No  . Drug Use: No  . Sexual Activity: Yes   Other Topics Concern  . Not on file   Social History Narrative  . No narrative on file    Current  Outpatient Prescriptions on File Prior to Visit  Medication Sig Dispense Refill  . amitriptyline (ELAVIL) 50 MG tablet Take 1 tablet (50 mg total) by mouth at bedtime as needed for sleep.  90 tablet  1  . aspirin 325 MG tablet Take 325 mg by mouth daily.      Marland Kitchen esomeprazole (NEXIUM) 40 MG capsule Take 40 mg by mouth daily at 12 noon.      . simvastatin (ZOCOR) 80 MG tablet Take 1 tablet (80 mg total) by mouth daily.  30 tablet  11  . [DISCONTINUED] vardenafil (LEVITRA) 20 MG tablet Take 20 mg by mouth as needed.         No current facility-administered medications on file prior to visit.    Allergies  Allergen Reactions  . Sulfonamide Derivatives Other (See Comments)    REACTION: blisters on skin    Family History  Problem Relation Age of Onset  . Ovarian cancer Mother   . Cancer Mother   . Lung cancer Maternal Uncle   . Lung cancer Maternal Aunt   . Colon cancer Neg Hx   . Esophageal cancer Neg Hx   . Stomach cancer  Neg Hx   . Rectal cancer Neg Hx   . Stroke Father     BP 130/90  Pulse 90  Temp(Src) 97.5 F (36.4 C) (Oral)  Ht 6\' 3"  (1.905 m)  Wt 202 lb (91.627 kg)  BMI 25.25 kg/m2  SpO2 97%    Review of Systems He thinks the RUE might be swollen.  Heartburn has recurred despite being on the nexium.  Denies weight loss and dysphagia.     Objective:   Physical Exam VITAL SIGNS:  See vs page.   GENERAL: no distress. RUE: normal temperature.   Pulses: right radial is intact. Neuro: sensation is slightly decreased from normal throughout the RUE.  Normal strength.    Lab Results  Component Value Date   WBC 2.7* 07/17/2014   HGB 12.8* 07/17/2014   HCT 38.8* 07/17/2014   PLT 184.0 07/17/2014   GLUCOSE 85 07/17/2014   CHOL 154 07/17/2014   TRIG 58.0 07/17/2014   HDL 44.40 07/17/2014   LDLCALC 98 07/17/2014   ALT 15 07/17/2014   AST 18 07/17/2014   NA 140 07/17/2014   K 4.0 07/17/2014   CL 106 07/17/2014   CREATININE 1.2 07/17/2014   BUN 14 07/17/2014   CO2 29 07/17/2014     TSH 1.18 07/17/2014   PSA 0.00* 07/17/2014   INR 2.2 05/27/2014        Assessment & Plan:  Numbness, new, uncertain etiology Leukopenia, stable Dyslipidemia: well-controlled GERD: recurrent despite nexium  Pt advised to continue same meds Refer to neurology and GI  Subjective:   Patient here for Medicare annual wellness visit and management of other chronic and acute problems.  Risk factors: advanced age    47 of Physicians Providing Medical Care to Patient:  See "snapshot"   Activities of Daily Living: In your present state of health, do you have any difficulty performing the following activities?:  Preparing food and eating?: No  Bathing yourself: No  Getting dressed: No  Using the toilet:No  Moving around from place to place: No  In the past year have you fallen or had a near fall?:No    Home Safety: Has smoke detector and wears seat belts. firearms are safely stored.  Diet and Exercise  Current exercise habits: pt says good.   Dietary issues discussed: pt reports a healthy diet.   Depression Screen  Q1: Over the past two weeks, have you felt down, depressed or hopeless? no  Q2: Over the past two weeks, have you felt little interest or pleasure in doing things? no   The following portions of the patient's history were reviewed and updated as appropriate: allergies, current medications, past family history, past medical history, past social history, past surgical history and problem list.  Past Medical History  Diagnosis Date  . Hyperlipidemia   . GERD (gastroesophageal reflux disease)   . History of colonic polyps   . Decreased white blood cell count   . Prostatism   . BPH (benign prostatic hypertrophy)   . History of kidney stones   . History of rheumatic fever   . History of esophageal stricture 10/31/2007  . Diverticulosis   . Gastric polyps   . Prostate cancer 11/09/2012  . Depression   . Erectile dysfunction     Past Surgical History  Procedure  Laterality Date  . Inguinal hernia repair    . Knee arthroscopy      left  . Tonsillectomy    . Carpal tunnel release  Bilateral  . Esophageal dilation      last dilation 8'12  . Robot assisted laparoscopic radical prostatectomy  11/08/2012    Procedure: ROBOTIC ASSISTED LAPAROSCOPIC RADICAL PROSTATECTOMY LEVEL 1;  Surgeon: Molli Hazard, MD;  Location: [facility];  Service: Urology;  Laterality: N/A;     . Umbilical hernia repair N/A 04/15/2013    Procedure: HERNIA REPAIR UMBILICAL ADULT;  Surgeon: Odis Hollingshead, MD;  Location: [place];  Service: General;  Laterality: N/A;  . Insertion of mesh N/A 04/15/2013    Procedure: INSERTION OF MESH;  Surgeon: Odis Hollingshead, MD;  Location: [place];  Service: General;  Laterality: N/A;    History   Social History  . Marital Status: Married    Spouse Name: N/A    Number of Children: 4  . Years of Education: N/A   Occupational History  . pastor    Social History Main Topics  . Smoking status: Former Smoker -- 1.00 packs/day for 25 years    Types: Cigarettes    Quit date: 12/05/1993  . Smokeless tobacco: Never Used  . Alcohol Use: No  . Drug Use: No  . Sexual Activity: Yes   Other Topics Concern  . Not on file   Social History Narrative  . No narrative on file    Current Outpatient Prescriptions on File Prior to Visit  Medication Sig Dispense Refill  . amitriptyline (ELAVIL) 50 MG tablet Take 1 tablet (50 mg total) by mouth at bedtime as needed for sleep.  90 tablet  1  . aspirin 325 MG tablet Take 325 mg by mouth daily.      Marland Kitchen esomeprazole (NEXIUM) 40 MG capsule Take 40 mg by mouth daily at 12 noon.      . simvastatin (ZOCOR) 80 MG tablet Take 1 tablet (80 mg total) by mouth daily.  30 tablet  11  . [DISCONTINUED] vardenafil (LEVITRA) 20 MG tablet Take 20 mg by mouth as needed.         No current facility-administered medications on file prior to visit.    Allergies  Allergen Reactions  . Sulfonamide Derivatives  Other (See Comments)    REACTION: blisters on skin    Family History  Problem Relation Age of Onset  . Ovarian cancer Mother   . Cancer Mother   . Lung cancer Maternal Uncle   . Lung cancer Maternal Aunt   . Colon cancer Neg Hx   . Esophageal cancer Neg Hx   . Stomach cancer Neg Hx   . Rectal cancer Neg Hx   . Stroke Father     BP 130/90  Pulse 90  Temp(Src) 97.5 F (36.4 C) (Oral)  Ht 6\' 3"  (1.905 m)  Wt 202 lb (91.627 kg)  BMI 25.25 kg/m2  SpO2 97%  Review of Systems  Denies hearing loss, and visual loss Objective:   Vision:  Sees opthalmologist Hearing: grossly normal Body mass index:  See vs page Msk: pt easily and quickly performs "get-up-and-go" from a sitting position Cognitive Impairment Assessment: cognition, memory and judgment appear normal.  remembers 3/3 at 5 minutes.  excellent recall.  can easily read and write a sentence.  alert and oriented x 3.  Assessment:   Medicare wellness utd on preventive parameters    Plan:   During the course of the visit the patient was educated and counseled about appropriate screening and preventive services including:        Fall prevention    Diabetes screening  Nutrition counseling   Vaccines / LABS Zostavax / Pneumococcal Vaccine  today  PSA  Patient Instructions (the written plan) was given to the patient.   we discussed code status.  pt requests full code, but would not want to be started or maintained on artificial life-support measures if there was not a reasonable chance of recovery.

## 2014-07-17 NOTE — ED Notes (Addendum)
Presents with 2 weeks of intermittent chest pain described as sharp associated with nausea, diaphoresis and dizziness. Chest pain radiates into abdomen. Endorses SOB that is intermittent as well but not associated with chest pain. Chest pain occurs at rest and with exertion. C/o right arm swelling. CMS intact. Chest pain and SOB are more frequent today and lasting lon ger than they have been. Denies pain at this time

## 2014-07-17 NOTE — Patient Instructions (Addendum)
Please see 2 specialists.  you will receive a phone call, about days and times for appointments. blood tests are being requested for you today.  We'll contact you with results. please consider these measures for your health:  minimize alcohol.  do not use tobacco products.  have a colonoscopy at least every 10 years from age 68.  keep firearms safely stored.  always use seat belts.  have working smoke alarms in your home.  see an eye doctor and dentist regularly.  never drive under the influence of alcohol or drugs (including prescription drugs).   good diet and exercise habits significanly improve your health.  please let me know if you wish to be referred to a dietician.  you should see an eye doctor and dentist every year.   Please return in 1 year.

## 2014-07-18 ENCOUNTER — Encounter (HOSPITAL_COMMUNITY): Payer: Self-pay | Admitting: General Practice

## 2014-07-18 ENCOUNTER — Telehealth: Payer: Self-pay | Admitting: *Deleted

## 2014-07-18 ENCOUNTER — Observation Stay (HOSPITAL_COMMUNITY)
Admission: EM | Admit: 2014-07-18 | Discharge: 2014-07-18 | Disposition: A | Discharge status: Home / Self Care | Payer: Medicare Other | Attending: Internal Medicine | Admitting: Internal Medicine

## 2014-07-18 ENCOUNTER — Encounter (HOSPITAL_COMMUNITY)
Admission: EM | Disposition: A | Discharge status: Home / Self Care | Payer: Self-pay | Source: Home / Self Care | Attending: Emergency Medicine

## 2014-07-18 DIAGNOSIS — Z86711 Personal history of pulmonary embolism: Secondary | ICD-10-CM | POA: Diagnosis present

## 2014-07-18 DIAGNOSIS — R0609 Other forms of dyspnea: Secondary | ICD-10-CM

## 2014-07-18 DIAGNOSIS — R0989 Other specified symptoms and signs involving the circulatory and respiratory systems: Principal | ICD-10-CM

## 2014-07-18 DIAGNOSIS — K219 Gastro-esophageal reflux disease without esophagitis: Secondary | ICD-10-CM | POA: Diagnosis not present

## 2014-07-18 DIAGNOSIS — I200 Unstable angina: Secondary | ICD-10-CM

## 2014-07-18 DIAGNOSIS — I2782 Chronic pulmonary embolism: Secondary | ICD-10-CM | POA: Diagnosis not present

## 2014-07-18 DIAGNOSIS — I25110 Atherosclerotic heart disease of native coronary artery with unstable angina pectoris: Secondary | ICD-10-CM

## 2014-07-18 DIAGNOSIS — K222 Esophageal obstruction: Secondary | ICD-10-CM | POA: Diagnosis present

## 2014-07-18 DIAGNOSIS — R079 Chest pain, unspecified: Secondary | ICD-10-CM

## 2014-07-18 DIAGNOSIS — F3289 Other specified depressive episodes: Secondary | ICD-10-CM | POA: Diagnosis not present

## 2014-07-18 DIAGNOSIS — F329 Major depressive disorder, single episode, unspecified: Secondary | ICD-10-CM

## 2014-07-18 DIAGNOSIS — R0600 Dyspnea, unspecified: Secondary | ICD-10-CM | POA: Diagnosis present

## 2014-07-18 DIAGNOSIS — C61 Malignant neoplasm of prostate: Secondary | ICD-10-CM | POA: Diagnosis present

## 2014-07-18 DIAGNOSIS — E785 Hyperlipidemia, unspecified: Secondary | ICD-10-CM | POA: Diagnosis not present

## 2014-07-18 DIAGNOSIS — I2510 Atherosclerotic heart disease of native coronary artery without angina pectoris: Secondary | ICD-10-CM | POA: Diagnosis not present

## 2014-07-18 HISTORY — PX: LEFT HEART CATHETERIZATION WITH CORONARY ANGIOGRAM: SHX5451

## 2014-07-18 LAB — POCT I-STAT 3, VENOUS BLOOD GAS (G3P V)
Acid-base deficit: 3 mmol/L — ABNORMAL HIGH (ref 0.0–2.0)
Bicarbonate: 23.7 mEq/L (ref 20.0–24.0)
O2 SAT: 69 %
PCO2 VEN: 48.5 mmHg (ref 45.0–50.0)
PH VEN: 7.297 (ref 7.250–7.300)
PO2 VEN: 40 mmHg (ref 30.0–45.0)
TCO2: 25 mmol/L (ref 0–100)

## 2014-07-18 LAB — TROPONIN I

## 2014-07-18 LAB — POCT I-STAT 3, ART BLOOD GAS (G3+)
Acid-base deficit: 1 mmol/L (ref 0.0–2.0)
Bicarbonate: 25.4 mEq/L — ABNORMAL HIGH (ref 20.0–24.0)
O2 SAT: 99 %
PCO2 ART: 47.3 mmHg — AB (ref 35.0–45.0)
TCO2: 27 mmol/L (ref 0–100)
pH, Arterial: 7.338 — ABNORMAL LOW (ref 7.350–7.450)
pO2, Arterial: 129 mmHg — ABNORMAL HIGH (ref 80.0–100.0)

## 2014-07-18 LAB — D-DIMER, QUANTITATIVE (NOT AT ARMC): D DIMER QUANT: 0.28 ug{FEU}/mL (ref 0.00–0.48)

## 2014-07-18 LAB — POCT ACTIVATED CLOTTING TIME: ACTIVATED CLOTTING TIME: 169 s

## 2014-07-18 SURGERY — LEFT HEART CATHETERIZATION WITH CORONARY ANGIOGRAM
Anesthesia: LOCAL

## 2014-07-18 MED ORDER — AMITRIPTYLINE HCL 50 MG PO TABS
50.0000 mg | ORAL_TABLET | Freq: Every evening | ORAL | Status: DC | PRN
  Filled 2014-07-18: qty 1

## 2014-07-18 MED ORDER — PANTOPRAZOLE SODIUM 40 MG PO TBEC
40.0000 mg | DELAYED_RELEASE_TABLET | Freq: Every day | ORAL | Status: DC
  Administered 2014-07-18: 40 mg via ORAL
  Filled 2014-07-18: qty 1

## 2014-07-18 MED ORDER — MORPHINE SULFATE 2 MG/ML IJ SOLN: 2.0000 mg | INTRAMUSCULAR | Status: DC | PRN

## 2014-07-18 MED ORDER — SODIUM CHLORIDE 0.9 % IV SOLN
INTRAVENOUS | Status: DC
  Administered 2014-07-18: 10:00:00 via INTRAVENOUS

## 2014-07-18 MED ORDER — LIDOCAINE HCL (PF) 1 % IJ SOLN
INTRAMUSCULAR | Status: AC
  Filled 2014-07-18: qty 30

## 2014-07-18 MED ORDER — ACETAMINOPHEN 325 MG PO TABS: 650.0000 mg | ORAL_TABLET | ORAL | Status: DC | PRN

## 2014-07-18 MED ORDER — ASPIRIN 81 MG PO CHEW
81.0000 mg | CHEWABLE_TABLET | ORAL | Status: AC
  Administered 2014-07-18: 81 mg via ORAL
  Filled 2014-07-18: qty 1

## 2014-07-18 MED ORDER — ENOXAPARIN SODIUM 40 MG/0.4ML ~~LOC~~ SOLN
40.0000 mg | SUBCUTANEOUS | Status: DC
  Filled 2014-07-18 (×2): qty 0.4

## 2014-07-18 MED ORDER — NITROGLYCERIN 1 MG/10 ML FOR IR/CATH LAB
INTRA_ARTERIAL | Status: AC
  Filled 2014-07-18: qty 10

## 2014-07-18 MED ORDER — HEPARIN SODIUM (PORCINE) 1000 UNIT/ML IJ SOLN
INTRAMUSCULAR | Status: AC
  Filled 2014-07-18: qty 1

## 2014-07-18 MED ORDER — MIDAZOLAM HCL 2 MG/2ML IJ SOLN
INTRAMUSCULAR | Status: AC
  Filled 2014-07-18: qty 2

## 2014-07-18 MED ORDER — SODIUM CHLORIDE 0.9 % IV SOLN: INTRAVENOUS | Status: AC

## 2014-07-18 MED ORDER — HEPARIN (PORCINE) IN NACL 2-0.9 UNIT/ML-% IJ SOLN
INTRAMUSCULAR | Status: AC
  Filled 2014-07-18: qty 1500

## 2014-07-18 MED ORDER — SODIUM CHLORIDE 0.9 % IJ SOLN: 3.0000 mL | INTRAMUSCULAR | Status: DC | PRN

## 2014-07-18 MED ORDER — SODIUM CHLORIDE 0.9 % IV SOLN: 250.0000 mL | INTRAVENOUS | Status: DC | PRN

## 2014-07-18 MED ORDER — FENTANYL CITRATE 0.05 MG/ML IJ SOLN
INTRAMUSCULAR | Status: AC
  Filled 2014-07-18: qty 2

## 2014-07-18 MED ORDER — SODIUM CHLORIDE 0.9 % IJ SOLN
3.0000 mL | Freq: Two times a day (BID) | INTRAMUSCULAR | Status: DC
  Administered 2014-07-18: 3 mL via INTRAVENOUS

## 2014-07-18 MED ORDER — ASPIRIN 325 MG PO TABS
325.0000 mg | ORAL_TABLET | Freq: Every day | ORAL | Status: DC
  Filled 2014-07-18: qty 1

## 2014-07-18 MED ORDER — ONDANSETRON HCL 4 MG/2ML IJ SOLN: 4.0000 mg | Freq: Four times a day (QID) | INTRAMUSCULAR | Status: DC | PRN

## 2014-07-18 MED ORDER — METOPROLOL TARTRATE 25 MG PO TABS
25.0000 mg | ORAL_TABLET | Freq: Two times a day (BID) | ORAL | Status: DC
  Administered 2014-07-18: 25 mg via ORAL
  Filled 2014-07-18 (×2): qty 1

## 2014-07-18 MED ORDER — ATORVASTATIN CALCIUM 40 MG PO TABS
40.0000 mg | ORAL_TABLET | Freq: Every day | ORAL | Status: DC
  Administered 2014-07-18: 40 mg via ORAL
  Filled 2014-07-18: qty 1

## 2014-07-18 MED ORDER — CAMPHOR-MENTHOL 0.5-0.5 % EX LOTN
TOPICAL_LOTION | CUTANEOUS | Status: DC | PRN
  Filled 2014-07-18: qty 222

## 2014-07-18 NOTE — Plan of Care (Signed)
Problem: Consults Goal: Skin Care Protocol Initiated - if Braden Score 18 or less If consults are not indicated, leave blank or document N/A Outcome: Not Applicable Date Met:  96/88/64 braden score 22  Problem: Phase I Progression Outcomes Goal: Aspirin unless contraindicated Outcome: Completed/Met Date Met:  07/18/14 Pt took 325 mg of ASA at home prior to arrival to the ED. Goal: MD aware of Cardiac Marker results Outcome: Progressing Cardiac enzymes negative times one

## 2014-07-18 NOTE — H&P (View-Only) (Signed)
Primary Physician: Primary Cardiologist:   HPI:  68 y.o. male with Past medical history of dyslipidemia, GERD, prostate cancer, pulmonary embolism, coronary atherosclerosis.  I saw him in may for the first time in May 2015 in clinic He was referred for CAD  Coronary calcifications were noted on CT scan He was doing fine at the time without CP or SOB  Plan was to follow clinically    Recently he has been more SOB  Can''t do what could do a few months ago  When does anything breaks out into a sweat.   Yesterday  drivivng develop SOB  Had been active all day  At home broke out into a sweat while sitting. Episode lasted 10 min.   He denies any fever or chills no complaints of nausea or vomiting no abdominal pain, no diarrhea no orthopnea no leg swelling.  Patient complaint of acid reflux and mentions taking medication helps.  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         Past Medical History  Diagnosis Date  . Hyperlipidemia   . GERD (gastroesophageal reflux disease)   . History of colonic polyps   . Decreased white blood cell count   . Prostatism   . BPH (benign prostatic hypertrophy)   . History of kidney stones   . History of rheumatic fever   . History of esophageal stricture 10/31/2007  . Diverticulosis   . Gastric polyps   . Prostate cancer 11/09/2012  . Depression   . Erectile dysfunction     Medications Prior to Admission  Medication Sig Dispense Refill  . amitriptyline (ELAVIL) 50 MG tablet Take 1 tablet (50 mg total) by mouth at bedtime as needed for sleep.  90 tablet  1  . aspirin 325 MG tablet Take 325 mg by mouth daily.      Marland Kitchen esomeprazole (NEXIUM) 40 MG capsule Take 40 mg by mouth daily before breakfast.       . Multiple Vitamin (MULTIVITAMIN WITH MINERALS)  TABS tablet Take 1 tablet by mouth daily.      Marland Kitchen PRESCRIPTION MEDICATION Inject 1 application into the skin daily as needed (erectile dysfunction). Penile injection compounded at  [facility]      . simvastatin (ZOCOR) 80 MG tablet Take 80 mg by mouth at bedtime.         Marland Kitchen aspirin  325 mg Oral Daily  . atorvastatin  40 mg Oral q1800  . enoxaparin (LOVENOX) injection  40 mg Subcutaneous Q24H  . metoprolol tartrate  25 mg Oral BID  . pantoprazole  40 mg Oral QAC breakfast    Infusions:    Allergies  Allergen Reactions  . Sulfonamide Derivatives Other (See Comments)     blisters on skin    History   Social History  . Marital Status: Married    Spouse Name: N/A    Number of Children: 4  . Years of Education: N/A   Occupational History  . pastor    Social History Main Topics  . Smoking status: Former Smoker -- 1.00 packs/day for 25 years    Types: Cigarettes    Quit date: 12/05/1993  . Smokeless tobacco: Never Used  . Alcohol Use: No  . Drug Use: No  . Sexual Activity: Yes   Other Topics Concern  .  Not on file   Social History Narrative  . No narrative on file    Family History  Problem Relation Age of Onset  . Ovarian cancer Mother   . Cancer Mother   . Lung cancer Maternal Uncle   . Lung cancer Maternal Aunt   . Colon cancer Neg Hx   . Esophageal cancer Neg Hx   . Stomach cancer Neg Hx   . Rectal cancer Neg Hx   . Stroke Father     REVIEW OF SYSTEMS:  All systems reviewed  Negative to the above problem except as noted above.    PHYSICAL EXAM: Filed Vitals:   07/18/14 0546  BP:   Pulse:   Temp: 97.7 F (36.5 C)  Resp:     General:  Well appearing. No respiratory difficulty HEENT: normal Neck: supple. no JVD. Carotids 2+ bilat; no bruits. No lymphadenopathy or thryomegaly appreciated. Cor: PMI nondisplaced. Regular rate & rhythm. No rubs, gallops or murmurs. Lungs: clear Abdomen: soft, nontender, nondistended. No hepatosplenomegaly. No  bruits or masses. Good bowel sounds. Extremities: no cyanosis, clubbing, rash, edema Neuro: alert & oriented x 3, cranial nerves grossly intact. moves all 4 extremities w/o difficulty. Affect pleasant.  ECG:  SR 97 bpm    Results for orders placed during the hospital encounter of 07/18/14 (from the past 24 hour(s))  CBC     Status: Abnormal   Collection Time    07/17/14  7:34 PM      Result Value Ref Range   WBC 3.8 (*) 4.0 - 10.5 K/uL   RBC 4.96  4.22 - 5.81 MIL/uL   Hemoglobin 13.2  13.0 - 17.0 g/dL   HCT 40.4  39.0 - 52.0 %   MCV 81.5  78.0 - 100.0 fL   MCH 26.6  26.0 - 34.0 pg   MCHC 32.7  30.0 - 36.0 g/dL   RDW 14.0  11.5 - 15.5 %   Platelets 183  150 - 400 K/uL  BASIC METABOLIC PANEL     Status: Abnormal   Collection Time    07/17/14  7:34 PM      Result Value Ref Range   Sodium 141  137 - 147 mEq/L   Potassium 4.4  3.7 - 5.3 mEq/L   Chloride 103  96 - 112 mEq/L   CO2 27  19 - 32 mEq/L   Glucose, Bld 88  70 - 99 mg/dL   BUN 15  6 - 23 mg/dL   Creatinine, Ser 1.36 (*) 0.50 - 1.35 mg/dL   Calcium 9.3  8.4 - 10.5 mg/dL   GFR calc non Af Amer 52 (*) >90 mL/min   GFR calc Af Amer 60 (*) >90 mL/min   Anion gap 11  5 - 15  PRO B NATRIURETIC PEPTIDE     Status: None   Collection Time    07/17/14  7:34 PM      Result Value Ref Range   Pro B Natriuretic peptide (BNP) 26.8  0 - 125 pg/mL  I-STAT TROPOININ, ED     Status: None   Collection Time    07/17/14  7:45 PM      Result Value Ref Range   Troponin i, poc 0.00  0.00 - 0.08 ng/mL   Comment 3           TROPONIN I     Status: None   Collection Time    07/18/14  1:45 AM      Result Value Ref  Range   Troponin I <0.30  <0.30 ng/mL  TROPONIN I     Status: None   Collection Time    07/18/14  3:06 AM      Result Value Ref Range   Troponin I <0.30  <0.30 ng/mL  D-DIMER, QUANTITATIVE     Status: None   Collection Time    07/18/14  3:06 AM      Result Value Ref Range   D-Dimer, Quant 0.28  0.00 - 0.48 ug/mL-FEU    TROPONIN I     Status: None   Collection Time    07/18/14  6:08 AM      Result Value Ref Range   Troponin I <0.30  <0.30 ng/mL   Dg Chest 2 View  07/17/2014   CLINICAL DATA:  Chest pain, nausea and dizziness. Right arm swelling. Shortness of breath.  EXAM: CHEST  2 VIEW  COMPARISON:  Chest radiograph performed 11/12/2013, and CTA of the chest performed 11/13/2013  FINDINGS: The lungs are well-aerated and clear. There is mild elevation of the right hemidiaphragm. There is no evidence of focal opacification, pleural effusion or pneumothorax.  The heart is normal in size; the mediastinal contour is within normal limits. No acute osseous abnormalities are seen.  IMPRESSION: No acute cardiopulmonary process seen.   Electronically Signed   By: Garald Balding M.D.   On: 07/17/2014 21:31     ASSESSMENT: Patient is a 68 yo with history of CAD as noted on CT  Also a history of PE  Treated. Doing well when I saw him in May  Now notes increased SOB with activity.  Diaphoresis.  Presented to ER  D Dimer negative Exam unremarkable   I have discussed with patient  I am very concerned that symptoms may be from CAD.  I would Recomm L and poss R heart cath to define anatomy and pressures (if no signif CAD) Discussed risks and benefits  Patient understands an agrees to proceed.

## 2014-07-18 NOTE — Telephone Encounter (Signed)
Left msg on triage stating received PA for amitriptyline med is consider a high risk for pt age. Pls call back case # [id number]...Omar [name]

## 2014-07-18 NOTE — Consult Note (Signed)
Primary Physician: Primary Cardiologist:   HPI:  68 y.o. male with Past medical history of dyslipidemia, GERD, prostate cancer, pulmonary embolism, coronary atherosclerosis.  I saw him in may for the first time in May 2015 in clinic He was referred for CAD  Coronary calcifications were noted on CT scan He was doing fine at the time without CP or SOB  Plan was to follow clinically    Recently he has been more SOB  Can''t do what could do a few months ago  When does anything breaks out into a sweat.   Yesterday  drivivng develop SOB  Had been active all day  At home broke out into a sweat while sitting. Episode lasted 10 min.   He denies any fever or chills no complaints of nausea or vomiting no abdominal pain, no diarrhea no orthopnea no leg swelling.  Patient complaint of acid reflux and mentions taking medication helps.  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         Past Medical History  Diagnosis Date  . Hyperlipidemia   . GERD (gastroesophageal reflux disease)   . History of colonic polyps   . Decreased white blood cell count   . Prostatism   . BPH (benign prostatic hypertrophy)   . History of kidney stones   . History of rheumatic fever   . History of esophageal stricture 10/31/2007  . Diverticulosis   . Gastric polyps   . Prostate cancer 11/09/2012  . Depression   . Erectile dysfunction     Medications Prior to Admission  Medication Sig Dispense Refill  . amitriptyline (ELAVIL) 50 MG tablet Take 1 tablet (50 mg total) by mouth at bedtime as needed for sleep.  90 tablet  1  . aspirin 325 MG tablet Take 325 mg by mouth daily.      Marland Kitchen esomeprazole (NEXIUM) 40 MG capsule Take 40 mg by mouth daily before breakfast.       . Multiple Vitamin (MULTIVITAMIN WITH MINERALS)  TABS tablet Take 1 tablet by mouth daily.      Marland Kitchen PRESCRIPTION MEDICATION Inject 1 application into the skin daily as needed (erectile dysfunction). Penile injection compounded at  [facility]      . simvastatin (ZOCOR) 80 MG tablet Take 80 mg by mouth at bedtime.         Marland Kitchen aspirin  325 mg Oral Daily  . atorvastatin  40 mg Oral q1800  . enoxaparin (LOVENOX) injection  40 mg Subcutaneous Q24H  . metoprolol tartrate  25 mg Oral BID  . pantoprazole  40 mg Oral QAC breakfast    Infusions:    Allergies  Allergen Reactions  . Sulfonamide Derivatives Other (See Comments)     blisters on skin    History   Social History  . Marital Status: Married    Spouse Name: N/A    Number of Children: 4  . Years of Education: N/A   Occupational History  . pastor    Social History Main Topics  . Smoking status: Former Smoker -- 1.00 packs/day for 25 years    Types: Cigarettes    Quit date: 12/05/1993  . Smokeless tobacco: Never Used  . Alcohol Use: No  . Drug Use: No  . Sexual Activity: Yes   Other Topics Concern  .  Not on file   Social History Narrative  . No narrative on file    Family History  Problem Relation Age of Onset  . Ovarian cancer Mother   . Cancer Mother   . Lung cancer Maternal Uncle   . Lung cancer Maternal Aunt   . Colon cancer Neg Hx   . Esophageal cancer Neg Hx   . Stomach cancer Neg Hx   . Rectal cancer Neg Hx   . Stroke Father     REVIEW OF SYSTEMS:  All systems reviewed  Negative to the above problem except as noted above.    PHYSICAL EXAM: Filed Vitals:   07/18/14 0546  BP:   Pulse:   Temp: 97.7 F (36.5 C)  Resp:     General:  Well appearing. No respiratory difficulty HEENT: normal Neck: supple. no JVD. Carotids 2+ bilat; no bruits. No lymphadenopathy or thryomegaly appreciated. Cor: PMI nondisplaced. Regular rate & rhythm. No rubs, gallops or murmurs. Lungs: clear Abdomen: soft, nontender, nondistended. No hepatosplenomegaly. No  bruits or masses. Good bowel sounds. Extremities: no cyanosis, clubbing, rash, edema Neuro: alert & oriented x 3, cranial nerves grossly intact. moves all 4 extremities w/o difficulty. Affect pleasant.  ECG:  SR 97 bpm    Results for orders placed during the hospital encounter of 07/18/14 (from the past 24 hour(s))  CBC     Status: Abnormal   Collection Time    07/17/14  7:34 PM      Result Value Ref Range   WBC 3.8 (*) 4.0 - 10.5 K/uL   RBC 4.96  4.22 - 5.81 MIL/uL   Hemoglobin 13.2  13.0 - 17.0 g/dL   HCT 40.4  39.0 - 52.0 %   MCV 81.5  78.0 - 100.0 fL   MCH 26.6  26.0 - 34.0 pg   MCHC 32.7  30.0 - 36.0 g/dL   RDW 14.0  11.5 - 15.5 %   Platelets 183  150 - 400 K/uL  BASIC METABOLIC PANEL     Status: Abnormal   Collection Time    07/17/14  7:34 PM      Result Value Ref Range   Sodium 141  137 - 147 mEq/L   Potassium 4.4  3.7 - 5.3 mEq/L   Chloride 103  96 - 112 mEq/L   CO2 27  19 - 32 mEq/L   Glucose, Bld 88  70 - 99 mg/dL   BUN 15  6 - 23 mg/dL   Creatinine, Ser 1.36 (*) 0.50 - 1.35 mg/dL   Calcium 9.3  8.4 - 10.5 mg/dL   GFR calc non Af Amer 52 (*) >90 mL/min   GFR calc Af Amer 60 (*) >90 mL/min   Anion gap 11  5 - 15  PRO B NATRIURETIC PEPTIDE     Status: None   Collection Time    07/17/14  7:34 PM      Result Value Ref Range   Pro B Natriuretic peptide (BNP) 26.8  0 - 125 pg/mL  I-STAT TROPOININ, ED     Status: None   Collection Time    07/17/14  7:45 PM      Result Value Ref Range   Troponin i, poc 0.00  0.00 - 0.08 ng/mL   Comment 3           TROPONIN I     Status: None   Collection Time    07/18/14  1:45 AM      Result Value Ref  Range   Troponin I <0.30  <0.30 ng/mL  TROPONIN I     Status: None   Collection Time    07/18/14  3:06 AM      Result Value Ref Range   Troponin I <0.30  <0.30 ng/mL  D-DIMER, QUANTITATIVE     Status: None   Collection Time    07/18/14  3:06 AM      Result Value Ref Range   D-Dimer, Quant 0.28  0.00 - 0.48 ug/mL-FEU    TROPONIN I     Status: None   Collection Time    07/18/14  6:08 AM      Result Value Ref Range   Troponin I <0.30  <0.30 ng/mL   Dg Chest 2 View  07/17/2014   CLINICAL DATA:  Chest pain, nausea and dizziness. Right arm swelling. Shortness of breath.  EXAM: CHEST  2 VIEW  COMPARISON:  Chest radiograph performed 11/12/2013, and CTA of the chest performed 11/13/2013  FINDINGS: The lungs are well-aerated and clear. There is mild elevation of the right hemidiaphragm. There is no evidence of focal opacification, pleural effusion or pneumothorax.  The heart is normal in size; the mediastinal contour is within normal limits. No acute osseous abnormalities are seen.  IMPRESSION: No acute cardiopulmonary process seen.   Electronically Signed   By: Garald Balding M.D.   On: 07/17/2014 21:31     ASSESSMENT: Patient is a 68 yo with history of CAD as noted on CT  Also a history of PE  Treated. Doing well when I saw him in May  Now notes increased SOB with activity.  Diaphoresis.  Presented to ER  D Dimer negative Exam unremarkable   I have discussed with patient  I am very concerned that symptoms may be from CAD.  I would Recomm L and poss R heart cath to define anatomy and pressures (if no signif CAD) Discussed risks and benefits  Patient understands an agrees to proceed.

## 2014-07-18 NOTE — ED Notes (Signed)
MD at bedside. 

## 2014-07-18 NOTE — ED Notes (Signed)
Family at bedside. 

## 2014-07-18 NOTE — CV Procedure (Signed)
      Cardiac Catheterization Operative Report  Omar [name] [id number] 8/14/20153:10 PM Renato Shin, MD  Procedure Performed:  1. Left Heart Catheterization 2. Selective Coronary Angiography 3. Right Heart Catheterization 4. Left ventricular angiogram  Operator: Lauree Chandler, MD  Access: Right radial artery followed by access in the right femoral artery and right femoral vein.   Indication:  68 yo male with history of  HLD, coronary artery calcifications on CT, PE admitted with dyspnea.                          Procedure Details: The risks, benefits, complications, treatment options, and expected outcomes were discussed with the patient. The patient and/or family concurred with the proposed plan, giving informed consent. The patient was brought to the cath lab after IV hydration was begun and oral premedication was given. The patient was further sedated with Versed and Fentanyl. I initially obtained arterial access in the right radial artery. The patient had a radial artery loop. I could not advance the catheter. The right groin was prepped and draped in the usual manner. Using the modified Seldinger access technique, a 5 French sheath was placed in the right femoral artery. A 7 French sheath was inserted into the right femoral vein. A balloon tipped catheter was used to perform a right heart catheterization. Standard diagnostic catheters were used to perform selective coronary angiography. A pigtail catheter was used to perform a left ventricular angiogram. There were no immediate complications. A Terumo hemostasis band was applied to the right wrist. The patient was taken to the recovery area in stable condition.   Hemodynamic Findings: Ao: 129/81               LV: 128/1/3 RA: 2               RV: 23/0/3 PA: 23/5 (mean 12)      PCWP: 5  Fick Cardiac Output: 5.43 L/min Fick Cardiac Index: 2.47 L/min/m2 Central Aortic Saturation: 99% Pulmonary Artery Saturation:  69%  Angiographic Findings:  Left main: No obstructive disease.   Left Anterior Descending Artery: Large caliber vessel with mild luminal irregularities in the mid vessel. The large caliber diagonal branch has mild plaque disease.   Circumflex Artery: Large caliber vessel with large obtuse marginal branch. No obstructive disease.   Right Coronary Artery: Large dominant vessel with 20% stenosis in the proximal and mid vessel.   Left Ventricular Angiogram: LVEF=65%.   Impression: 1. Mild non-obstructive CAD 2. Normal LV systolic function 3. Normal right heart pressures  Recommendations: Medical management of mild CAD. Further dyspnea workup per primary team.        Complications:  None; patient tolerated the procedure well.

## 2014-07-18 NOTE — Progress Notes (Signed)
Patient ambulated after bedrest was up, tolerated well with no complications.  Patient was provided discharge instructions including the care of radial and groin cath sites.  Patient had no questions at this time.  IV removed x 2, site clean dry and intact.  Patient was discharged and left the unit in a wheelchair by the NT.

## 2014-07-18 NOTE — Interval H&P Note (Signed)
History and Physical Interval Note:  07/18/2014 2:08 PM  Omar [name].  has presented today for cardiac cath with the diagnosis of cp.  The various methods of treatment have been discussed with the patient and family. After consideration of risks, benefits and other options for treatment, the patient has consented to  Procedure(s): LEFT HEART CATHETERIZATION WITH CORONARY ANGIOGRAM (N/A) as a surgical intervention .  The patient's history has been reviewed, patient examined, no change in status, stable for surgery.  I have reviewed the patient's chart and labs.  Questions were answered to the patient's satisfaction.    Cath Lab Visit (complete for each Cath Lab visit)  Clinical Evaluation Leading to the Procedure:   ACS: No.  Non-ACS:    Anginal Classification: CCS III  Anti-ischemic medical therapy: No Therapy  Non-Invasive Test Results: No non-invasive testing performed  Prior CABG: No previous CABG   MCALHANY,CHRISTOPHER

## 2014-07-18 NOTE — Discharge Instructions (Signed)
Cardiac Diet This diet can help prevent heart disease and stroke. Many factors influence your heart health, including eating and exercise habits. Coronary risk rises a lot with abnormal blood fat (lipid) levels. Cardiac meal planning includes limiting unhealthy fats, increasing healthy fats, and making other small dietary changes. General guidelines are as follows:  Adjust calorie intake to reach and maintain desirable body weight.  Limit total fat intake to less than 30% of total calories. Saturated fat should be less than 7% of calories.  Saturated fats are found in animal products and in some vegetable products. Saturated vegetable fats are found in coconut oil, cocoa butter, palm oil, and palm kernel oil. Read labels carefully to avoid these products as much as possible. Use butter in moderation. Choose tub margarines and oils that have 2 grams of fat or less. Good cooking oils are canola and olive oils.  Practice low-fat cooking techniques. Do not fry food. Instead, broil, bake, boil, steam, grill, roast on a rack, stir-fry, or microwave it. Other fat reducing suggestions include:  Remove the skin from poultry.  Remove all visible fat from meats.  Skim the fat off stews, soups, and gravies before serving them.  Steam vegetables in water or broth instead of sauting them in fat.  Avoid foods with trans fat (or hydrogenated oils), such as commercially fried foods and commercially baked goods. Commercial shortening and deep-frying fats will contain trans fat.  Increase intake of fruits, vegetables, whole grains, and legumes to replace foods high in fat.  Increase consumption of nuts, legumes, and seeds to at least 4 servings weekly. One serving of a legume equals  cup, and 1 serving of nuts or seeds equals  cup.  Choose whole grains more often. Have 3 servings per day (a serving is 1 ounce [oz]).  Eat 4 to 5 servings of vegetables per day. A serving of vegetables is 1 cup of raw leafy  vegetables;  cup of raw or cooked cut-up vegetables;  cup of vegetable juice.  Eat 4 to 5 servings of fruit per day. A serving of fruit is 1 medium whole fruit;  cup of dried fruit;  cup of fresh, frozen, or canned fruit;  cup of 100% fruit juice.  Increase your intake of dietary fiber to 20 to 30 grams per day. Insoluble fiber may help lower your risk of heart disease and may help curb your appetite.  Soluble fiber binds cholesterol to be removed from the blood. Foods high in soluble fiber are dried beans, citrus fruits, oats, apples, bananas, broccoli, Brussels sprouts, and eggplant.  Try to include foods fortified with plant sterols or stanols, such as yogurt, breads, juices, or margarines. Choose several fortified foods to achieve a daily intake of 2 to 3 grams of plant sterols or stanols.  Foods with omega-3 fats can help reduce your risk of heart disease. Aim to have a 3.5 oz portion of fatty fish twice per week, such as salmon, mackerel, albacore tuna, sardines, lake trout, or herring. If you wish to take a fish oil supplement, choose one that contains 1 gram of both DHA and EPA.  Limit processed meats to 2 servings (3 oz portion) weekly.  Limit the sodium in your diet to 1500 milligrams (mg) per day. If you have high blood pressure, talk to a registered dietitian about a DASH (Dietary Approaches to Stop Hypertension) eating plan.  Limit sweets and beverages with added sugar, such as soda, to no more than 5 servings per week. One  serving is:   1 tablespoon sugar.  1 tablespoon jelly or jam.   cup sorbet.  1 cup lemonade.   cup regular soda. CHOOSING FOODS Starches  Allowed: Breads: All kinds (wheat, rye, raisin, white, oatmeal, [place], [place], and English muffin bread). Low-fat rolls: English muffins, frankfurter and hamburger buns, bagels, pita bread, tortillas (not fried). Pancakes, waffles, biscuits, and muffins made with recommended oil.  Avoid: Products made with  saturated or trans fats, oils, or whole milk products. Butter rolls, cheese breads, croissants. Commercial doughnuts, muffins, sweet rolls, biscuits, waffles, pancakes, store-bought mixes. Crackers  Allowed: Low-fat crackers and snacks: Animal, graham, rye, saltine (with recommended oil, no lard), oyster, and matzo crackers. Bread sticks, melba toast, rusks, flatbread, pretzels, and light popcorn.  Avoid: High-fat crackers: cheese crackers, butter crackers, and those made with coconut, palm oil, or trans fat (hydrogenated oils). Buttered popcorn. Cereals  Allowed: Hot or cold whole-grain cereals.  Avoid: Cereals containing coconut, hydrogenated vegetable fat, or animal fat. Potatoes / Pasta / Rice  Allowed: All kinds of potatoes, rice, and pasta (such as macaroni, spaghetti, and noodles).  Avoid: Pasta or rice prepared with cream sauce or high-fat cheese. Chow mein noodles, [place] fries. Vegetables  Allowed: All vegetables and vegetable juices.  Avoid: Fried vegetables. Vegetables in cream, butter, or high-fat cheese sauces. Limit coconut. Fruit in cream or custard. Protein  Allowed: Limit your intake of meat, seafood, and poultry to no more than 6 oz (cooked weight) per day. All lean, well-trimmed beef, veal, pork, and lamb. All chicken and [place] without skin. All fish and shellfish. Wild game: wild duck, rabbit, pheasant, and venison. Egg whites or low-cholesterol egg substitutes may be used as desired. Meatless dishes: recipes with dried beans, peas, lentils, and tofu (soybean curd). Seeds and nuts: all seeds and most nuts.  Avoid: Prime grade and other heavily marbled and fatty meats, such as short ribs, spare ribs, rib eye roast or steak, frankfurters, sausage, bacon, and high-fat luncheon meats, mutton. Caviar. Commercially fried fish. Domestic duck, goose, venison sausage. Organ meats: liver, gizzard, heart, chitterlings, brains, kidney, sweetbreads. Dairy  Allowed: Low-fat  cheeses: nonfat or low-fat cottage cheese (1% or 2% fat), cheeses made with part skim milk, such as mozzarella, farmers, string, or ricotta. (Cheeses should be labeled no more than 2 to 6 grams fat per oz.). Skim (or 1%) milk: liquid, powdered, or evaporated. Buttermilk made with low-fat milk. Drinks made with skim or low-fat milk or cocoa. Chocolate milk or cocoa made with skim or low-fat (1%) milk. Nonfat or low-fat yogurt.  Avoid: Whole milk cheeses, including colby, cheddar, mu[place], Monterey Jack, [place], [place], [place], American, Swiss, and blue. Creamed cottage cheese, cream cheese. Whole milk and whole milk products, including buttermilk or yogurt made from whole milk, drinks made from whole milk. Condensed milk, evaporated whole milk, and 2% milk. Soups and Combination Foods  Allowed: Low-fat low-sodium soups: broth, dehydrated soups, homemade broth, soups with the fat removed, homemade cream soups made with skim or low-fat milk. Low-fat spaghetti, lasagna, chili, and Spanish rice if low-fat ingredients and low-fat cooking techniques are used.  Avoid: Cream soups made with whole milk, cream, or high-fat cheese. All other soups. Desserts and Sweets  Allowed: Sherbet, fruit ices, gelatins, meringues, and angel food cake. Homemade desserts with recommended fats, oils, and milk products. Jam, jelly, honey, marmalade, sugars, and syrups. Pure sugar candy, such as gum drops, hard candy, jelly beans, marshmallows, mints, and small amounts of dark chocolate.  Avoid: Commercially prepared  cakes, pies, cookies, frosting, pudding, or mixes for these products. Desserts containing whole milk products, chocolate, coconut, lard, palm oil, or palm kernel oil. Ice cream or ice cream drinks. Candy that contains chocolate, coconut, butter, hydrogenated fat, or unknown ingredients. Buttered syrups. Fats and Oils  Allowed: Vegetable oils: safflower, sunflower, corn, soybean, cottonseed, sesame, canola, olive,  or peanut. Non-hydrogenated margarines. Salad dressing or mayonnaise: homemade or commercial, made with a recommended oil. Low or nonfat salad dressing or mayonnaise.  Limit added fats and oils to 6 to 8 tsp per day (includes fats used in cooking, baking, salads, and spreads on bread). Remember to count the "hidden fats" in foods.  Avoid: Solid fats and shortenings: butter, lard, salt pork, bacon drippings. Gravy containing meat fat, shortening, or suet. Cocoa butter, coconut. Coconut oil, palm oil, palm kernel oil, or hydrogenated oils: these ingredients are often used in bakery products, nondairy creamers, whipped toppings, candy, and commercially fried foods. Read labels carefully. Salad dressings made of unknown oils, sour cream, or cheese, such as blue cheese and Roquefort. Cream, all kinds: half-and-half, light, heavy, or whipping. Sour cream or cream cheese (even if "light" or low-fat). Nondairy cream substitutes: coffee creamers and sour cream substitutes made with palm, palm kernel, hydrogenated oils, or coconut oil. Beverages  Allowed: Coffee (regular or decaffeinated), tea. Diet carbonated beverages, mineral water. Alcohol: Check with your caregiver. Moderation is recommended.  Avoid: Whole milk, regular sodas, and juice drinks with added sugar. Condiments  Allowed: All seasonings and condiments. Cocoa powder. "Cream" sauces made with recommended ingredients.  Avoid: Carob powder made with hydrogenated fats. SAMPLE MENU Breakfast   cup orange juice   cup oatmeal  1 slice toast  1 tsp margarine  1 cup skim milk Lunch  [place] sandwich with 2 oz [place], 2 slices bread  Lettuce and tomato slices  Fresh fruit  Carrot sticks  Coffee or tea Snack  Fresh fruit or low-fat crackers Dinner  3 oz lean ground beef  1 baked potato  1 tsp margarine   cup asparagus  Lettuce salad  1 tbs non-creamy dressing   cup peach slices  1 cup skim milk Document Released:  08/30/2008 Document Revised: 05/22/2012 Document Reviewed: 01/21/2014 Exit[employer] Patient Information 2015 [place], [place]. This information is not intended to replace advice given to you by your health care provider. Make sure you discuss any questions you have with your health care provider.  Radial Site Care Refer to this sheet in the next few weeks. These instructions provide you with information on caring for yourself after your procedure. Your caregiver may also give you more specific instructions. Your treatment has been planned according to current medical practices, but problems sometimes occur. Call your caregiver if you have any problems or questions after your procedure. HOME CARE INSTRUCTIONS  You may shower the day after the procedure.Remove the bandage (dressing) and gently wash the site with plain soap and water.Gently pat the site dry.  Do not apply powder or lotion to the site.  Do not submerge the affected site in water for 3 to 5 days.  Inspect the site at least twice daily.  Do not flex or bend the affected arm for 24 hours.  No lifting over 5 pounds (2.3 kg) for 5 days after your procedure.  Do not drive home if you are discharged the same day of the procedure. Have someone else drive you.  You may drive 24 hours after the procedure unless otherwise instructed by your caregiver.  Do not operate machinery or power tools for 24 hours.  A responsible adult should be with you for the first 24 hours after you arrive home. What to expect:  Any bruising will usually fade within 1 to 2 weeks.  Blood that collects in the tissue (hematoma) may be painful to the touch. It should usually decrease in size and tenderness within 1 to 2 weeks. SEEK IMMEDIATE MEDICAL CARE IF:  You have unusual pain at the radial site.  You have redness, warmth, swelling, or pain at the radial site.  You have drainage (other than a small amount of blood on the dressing).  You have  chills.  You have a fever or persistent symptoms for more than 72 hours.  You have a fever and your symptoms suddenly get worse.  Your arm becomes pale, cool, tingly, or numb.  You have heavy bleeding from the site. Hold pressure on the site. Document Released: 12/24/2010 Document Revised: 02/13/2012 Document Reviewed: 12/24/2010 [facility] Patient Information 2015 [place], [place]. This information is not intended to replace advice given to you by your health care provider. Make sure you discuss any questions you have with your health care provider.   Angiogram, Care After Refer to this sheet in the next few weeks. These instructions provide you with information on caring for yourself after your procedure. Your health care provider may also give you more specific instructions. Your treatment has been planned according to current medical practices, but problems sometimes occur. Call your health care provider if you have any problems or questions after your procedure.  WHAT TO EXPECT AFTER THE PROCEDURE After your procedure, it is typical to have the following sensations:  Minor discomfort or tenderness and a small bump at the catheter insertion site. The bump should usually decrease in size and tenderness within 1 to 2 weeks.  Any bruising will usually fade within 2 to 4 weeks. HOME CARE INSTRUCTIONS   You may need to keep taking blood thinners if they were prescribed for you. Take medicines only as directed by your health care provider.  Do not apply powder or lotion to the site.  Do not take baths, swim, or use a hot tub until your health care provider approves.  You may shower 24 hours after the procedure. Remove the bandage (dressing) and gently wash the site with plain soap and water. Gently pat the site dry.  Inspect the site at least twice daily.  Limit your activity for the first 48 hours. Do not bend, squat, or lift anything over 20 lb (9 kg) or as directed by your health care  provider.  Plan to have someone take you home after the procedure. Follow instructions about when you can drive or return to work. SEEK MEDICAL CARE IF:  You get light-headed when standing up.  You have drainage (other than a small amount of blood on the dressing).  You have chills.  You have a fever.  You have redness, warmth, swelling, or pain at the insertion site. SEEK IMMEDIATE MEDICAL CARE IF:   You develop chest pain or shortness of breath, feel faint, or pass out.  You have bleeding, swelling larger than a walnut, or drainage from the catheter insertion site.  You develop pain, discoloration, coldness, or severe bruising in the leg or arm that held the catheter.  You develop bleeding from any other place, such as the bowels. You may see bright red blood in your urine or stools, or your stools may appear black  and tarry.  You have heavy bleeding from the site. If this happens, hold pressure on the site. MAKE SURE YOU:  Understand these instructions.  Will watch your condition.  Will get help right away if you are not doing well or get worse. Document Released: 06/09/2005 Document Revised: 04/07/2014 Document Reviewed: 04/15/2013 [facility] Patient Information 2015 [place], [place]. This information is not intended to replace advice given to you by your health care provider. Make sure you discuss any questions you have with your health care provider.

## 2014-07-18 NOTE — H&P (Signed)
[facility] History and Physical  Patient: Omar [name].  KKX:[id number]  DOB: 1946-03-17  DOS: the patient was seen and examined on 07/18/2014 PCP: Renato Shin, MD  Chief Complaint: Shortness of breath  HPI: [name] is a 68 y.o. male with Past medical history of dyslipidemia, GERD, prostate cancer, pulmonary embolism, coronary atherosclerosis. The patient presented with complaints of shortness of breath on exertion. Since last one week he has been having progressively worsening shortness of breath on exertion. Last night he had shortness of breath on exertion associated with some sternal epigastric chest pain and therefore he decided to come to the hospital. He denies any fever or chills no complaints of nausea or vomiting no abdominal pain, no diarrhea no orthopnea no leg swelling. Patient complaint of acid reflux and mentions taking medication helps. Mentions he had a stress test to 3 years ago done at the [place] [facility] although I cannot find the records. He denies any smoking history no alcohol abuse. Denies any prior cardiac intervention. 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  The patient is coming from home. And at his baseline independent for most of his ADL.  Review of Systems: as mentioned in the history of present illness.  A Comprehensive review of the other systems is negative.  Past Medical History  Diagnosis Date  . Hyperlipidemia   . GERD (gastroesophageal reflux disease)   . History of colonic polyps   . Decreased white blood cell count   . Prostatism   . BPH (benign prostatic hypertrophy)   . History of kidney stones   . History of rheumatic fever   . History of esophageal stricture 10/31/2007  . Diverticulosis   . Gastric polyps   .  Prostate cancer 11/09/2012  . Depression   . Erectile dysfunction    Past Surgical History  Procedure Laterality Date  . Inguinal hernia repair    . Knee arthroscopy      left  . Tonsillectomy    . Carpal tunnel release      Bilateral  . Esophageal dilation      last dilation 8'12  . Robot assisted laparoscopic radical prostatectomy  11/08/2012    Procedure: ROBOTIC ASSISTED LAPAROSCOPIC RADICAL PROSTATECTOMY LEVEL 1;  Surgeon: Molli Hazard, MD;  Location: [facility];  Service: Urology;  Laterality: N/A;     . Umbilical hernia repair N/A 04/15/2013    Procedure: HERNIA REPAIR UMBILICAL ADULT;  Surgeon: Odis Hollingshead, MD;  Location: [place];  Service: General;  Laterality: N/A;  . Insertion of mesh N/A 04/15/2013    Procedure: INSERTION OF MESH;  Surgeon: Odis Hollingshead, MD;  Location: [place];  Service: General;  Laterality: N/A;   Social History:  reports that he quit smoking about 20 years ago. His smoking use included Cigarettes. He has a 25 pack-year smoking history. He has never used smokeless tobacco. He reports that he does not drink alcohol or use illicit drugs.  Allergies  Allergen Reactions  . Sulfonamide Derivatives Other (See Comments)     blisters on skin    Family History  Problem Relation Age of Onset  . Ovarian cancer Mother   . Cancer Mother   . Lung cancer Maternal Uncle   . Lung cancer Maternal Aunt   . Colon cancer  Neg Hx   . Esophageal cancer Neg Hx   . Stomach cancer Neg Hx   . Rectal cancer Neg Hx   . Stroke Father     Prior to Admission medications   Medication Sig Start Date End Date Taking? Authorizing Provider  amitriptyline (ELAVIL) 50 MG tablet Take 1 tablet (50 mg total) by mouth at bedtime as needed for sleep. 07/01/14  Yes Biagio Borg, MD  aspirin 325 MG tablet Take 325 mg by mouth daily.   Yes Historical Provider, MD  esomeprazole (NEXIUM) 40 MG capsule Take 40 mg by mouth daily before breakfast.    Yes Historical Provider, MD   Multiple Vitamin (MULTIVITAMIN WITH MINERALS) TABS tablet Take 1 tablet by mouth daily.   Yes Historical Provider, MD  PRESCRIPTION MEDICATION Inject 1 application into the skin daily as needed (erectile dysfunction). Penile injection compounded at  [place] Provider, MD  simvastatin (ZOCOR) 80 MG tablet Take 80 mg by mouth at bedtime.   Yes Historical Provider, MD    Physical Exam: Filed Vitals:   07/18/14 0145 07/18/14 0200 07/18/14 0215 07/18/14 0230  BP: 150/100 151/102 148/94 161/106  Pulse: 72 70 76 77  Temp:      TempSrc:      Resp: 17 17 25 17   Weight:      SpO2: 99% 99% 100% 100%    General: Alert, Awake and Oriented to Time, Place and Person. Appear in mild distress Eyes: PERRL ENT: Oral Mucosa clear moist. Neck: no JVD Cardiovascular: S1 and S2 Present, no Murmur, Peripheral Pulses Present Respiratory: Bilateral Air entry equal and Decreased, Clear to Auscultation, noCrackles, no wheezes Abdomen: Bowel Sound Present, Soft and Non tender Skin: no Rash Extremities: no Pedal edema, no calf tenderness Neurologic: Grossly no focal neuro deficit.  Labs on Admission:  CBC:  Recent Labs Lab 07/17/14 0829 07/17/14 1934  WBC 2.7* 3.8*  NEUTROABS 1.2*  --   HGB 12.8* 13.2  HCT 38.8* 40.4  MCV 79.6 81.5  PLT 184.0 183    CMP     Component Value Date/Time   NA 141 07/17/2014 1934   K 4.4 07/17/2014 1934   CL 103 07/17/2014 1934   CO2 27 07/17/2014 1934   GLUCOSE 88 07/17/2014 1934   BUN 15 07/17/2014 1934   CREATININE 1.36* 07/17/2014 1934   CALCIUM 9.3 07/17/2014 1934   PROT 6.0 07/17/2014 0829   ALBUMIN 3.8 07/17/2014 0829   AST 18 07/17/2014 0829   ALT 15 07/17/2014 0829   ALKPHOS 52 07/17/2014 0829   BILITOT 0.7 07/17/2014 0829   GFRNONAA 52* 07/17/2014 1934   GFRAA 60* 07/17/2014 1934    No results found for this basename: LIPASE, AMYLASE,  in the last 168 hours No results found for this basename: AMMONIA,  in the last 168  hours   Recent Labs Lab 07/18/14 0145 07/18/14 0306  TROPONINI <0.30 <0.30   BNP (last 3 results)  Recent Labs  07/17/14 1934  PROBNP 26.8    Radiological Exams on Admission: Dg Chest 2 View  07/17/2014   CLINICAL DATA:  Chest pain, nausea and dizziness. Right arm swelling. Shortness of breath.  EXAM: CHEST  2 VIEW  COMPARISON:  Chest radiograph performed 11/12/2013, and CTA of the chest performed 11/13/2013  FINDINGS: The lungs are well-aerated and clear. There is mild elevation of the right hemidiaphragm. There is no evidence of focal opacification, pleural effusion or pneumothorax.  The heart is normal in  size; the mediastinal contour is within normal limits. No acute osseous abnormalities are seen.  IMPRESSION: No acute cardiopulmonary process seen.   Electronically Signed   By: Garald Balding M.D.   On: 07/17/2014 21:31    EKG: Independently reviewed. normal sinus rhythm, nonspecific ST and T waves changes. Assessment/Plan Principal Problem:   DOE (dyspnea on exertion) Active Problems:   Stricture and stenosis of esophagus   Prostate cancer   Chest pain on exertion   History of pulmonary embolism   1. DOE (dyspnea on exertion) The patient is presenting with complaints of dyspnea on exertion and one episode of chest pain. At the time of my evaluation he is chest pain-free he EKG and troponin are negative for any acute abnormality his blood pressure is mildly elevated and he denies any prior history of hypertension. With this the patient will be admitted in the hospital. We will check d-dimer due to his prior history, although at that time the pulmonary embolism was thought to be secondary to [place] drive to [place], and patient denies any recent [place] travel in the last 3 months. Continue with serial troponin and telemetry monitoring and echocardiogram. Aspirin 325 continue Lipitor 40 For high blood pressure I will put him on Lopressor 25 twice a day.  2. GERD Esophageal  stricture Continue Protonix.  DVT Prophylaxis: subcutaneous Heparin Nutrition: N.p.o. after midnight  Code Status: Full  Disposition: Admitted to observation in telemetry unit.  Author: Berle Mull, MD [facility] Pager: [phone] 07/18/2014, 3:58 AM    If 7PM-7AM, please contact night-coverage [email or web address] Password [email or web address]  **Disclaimer: This note may have been dictated with voice recognition software. Similar sounding words can inadvertently be transcribed and this note may contain transcription errors which may not have been corrected upon publication of note.**

## 2014-07-18 NOTE — ED Provider Notes (Addendum)
CSN: [id number]     Arrival date & time 07/17/14  1921 History   First MD Initiated Contact with Patient 07/18/14 0043     Chief Complaint  Patient presents with  . Chest Pain     (Consider location/radiation/quality/duration/timing/severity/associated sxs/prior Treatment) HPI  Omar [name] is a 68 year old male with a past medical history of hyperlipidemia coming in with right-sided chest pain shortness of breath and diaphoresis on exertion. Patient states he has no prior history of this. He estimates nausea without any episodes of emesis. Patient states the pain is tight and radiates to his neck. His symptoms were also current rest. Nothing has seemed to make it better. Patient does not have a cardiologist at this time does not have his heart evaluated. He denies any recent fevers chills coughing abdominal pain or changes in his urine or stool. Patient also history of reflux but states that this pain is different. He has had this pain for the past couple of weeks.  Past Medical History  Diagnosis Date  . Hyperlipidemia   . GERD (gastroesophageal reflux disease)   . History of colonic polyps   . Decreased white blood cell count   . Prostatism   . BPH (benign prostatic hypertrophy)   . History of kidney stones   . History of rheumatic fever   . History of esophageal stricture 10/31/2007  . Diverticulosis   . Gastric polyps   . Prostate cancer 11/09/2012  . Depression   . Erectile dysfunction    Past Surgical History  Procedure Laterality Date  . Inguinal hernia repair    . Knee arthroscopy      left  . Tonsillectomy    . Carpal tunnel release      Bilateral  . Esophageal dilation      last dilation 8'12  . Robot assisted laparoscopic radical prostatectomy  11/08/2012    Procedure: ROBOTIC ASSISTED LAPAROSCOPIC RADICAL PROSTATECTOMY LEVEL 1;  Surgeon: Molli Hazard, MD;  Location: [facility];  Service: Urology;  Laterality: N/A;     . Umbilical hernia repair N/A 04/15/2013     Procedure: HERNIA REPAIR UMBILICAL ADULT;  Surgeon: Odis Hollingshead, MD;  Location: [place];  Service: General;  Laterality: N/A;  . Insertion of mesh N/A 04/15/2013    Procedure: INSERTION OF MESH;  Surgeon: Odis Hollingshead, MD;  Location: [place];  Service: General;  Laterality: N/A;   Family History  Problem Relation Age of Onset  . Ovarian cancer Mother   . Cancer Mother   . Lung cancer Maternal Uncle   . Lung cancer Maternal Aunt   . Colon cancer Neg Hx   . Esophageal cancer Neg Hx   . Stomach cancer Neg Hx   . Rectal cancer Neg Hx   . Stroke Father    History  Substance Use Topics  . Smoking status: Former Smoker -- 1.00 packs/day for 25 years    Types: Cigarettes    Quit date: 12/05/1993  . Smokeless tobacco: Never Used  . Alcohol Use: No    Review of Systems 10 Systems reviewed and are negative for acute change except as noted in the HPI.    Allergies  Sulfonamide derivatives  Home Medications   Prior to Admission medications   Medication Sig Start Date End Date Taking? Authorizing Provider  amitriptyline (ELAVIL) 50 MG tablet Take 1 tablet (50 mg total) by mouth at bedtime as needed for sleep. 07/01/14  Yes Biagio Borg, MD  aspirin 325 MG  tablet Take 325 mg by mouth daily.   Yes Historical Provider, MD  esomeprazole (NEXIUM) 40 MG capsule Take 40 mg by mouth daily before breakfast.    Yes Historical Provider, MD  Multiple Vitamin (MULTIVITAMIN WITH MINERALS) TABS tablet Take 1 tablet by mouth daily.   Yes Historical Provider, MD  PRESCRIPTION MEDICATION Inject 1 application into the skin daily as needed (erectile dysfunction). Penile injection compounded at  [place] Provider, MD  simvastatin (ZOCOR) 80 MG tablet Take 80 mg by mouth at bedtime.   Yes Historical Provider, MD   BP 155/99  Pulse 75  Temp(Src) 97.4 F (36.3 C) (Oral)  Resp 17  Wt 202 lb 8 oz (91.853 kg)  SpO2 100% Physical Exam  Nursing note and vitals  reviewed. Constitutional: He is oriented to person, place, and time. Vital signs are normal. He appears well-developed and well-nourished.  Non-toxic appearance. He does not appear ill. No distress.  HENT:  Head: Normocephalic and atraumatic.  Nose: Nose normal.  Mouth/Throat: Oropharynx is clear and moist. No oropharyngeal exudate.  Eyes: Conjunctivae and EOM are normal. Pupils are equal, round, and reactive to light. No scleral icterus.  Neck: Normal range of motion. Neck supple. No tracheal deviation, no edema, no erythema and normal range of motion present. No mass and no thyromegaly present.  Cardiovascular: Normal rate, regular rhythm, S1 normal, S2 normal, normal heart sounds, intact distal pulses and normal pulses.  Exam reveals no gallop and no friction rub.   No murmur heard. Pulses:      Radial pulses are 2+ on the right side, and 2+ on the left side.       Dorsalis pedis pulses are 2+ on the right side, and 2+ on the left side.  Pulmonary/Chest: Effort normal and breath sounds normal. No respiratory distress. He has no wheezes. He has no rhonchi. He has no rales.  Abdominal: Soft. Normal appearance and bowel sounds are normal. He exhibits no distension, no ascites and no mass. There is no hepatosplenomegaly. There is no tenderness. There is no rebound, no guarding and no CVA tenderness.  Surgical scars from hernia repair and prostatectomy noted.  Musculoskeletal: Normal range of motion. He exhibits no edema and no tenderness.  Lymphadenopathy:    He has no cervical adenopathy.  Neurological: He is alert and oriented to person, place, and time. He has normal strength. No sensory deficit. GCS eye subscore is 4. GCS verbal subscore is 5. GCS motor subscore is 6.  Skin: Skin is warm, dry and intact. No petechiae and no rash noted. He is not diaphoretic. No erythema. No pallor.  Psychiatric: He has a normal mood and affect. His behavior is normal. Judgment normal.    ED Course   Procedures (including critical care time) Labs Review Labs Reviewed  CBC - Abnormal; Notable for the following:    WBC 3.8 (*)    All other components within normal limits  BASIC METABOLIC PANEL - Abnormal; Notable for the following:    Creatinine, Ser 1.36 (*)    GFR calc non Af Amer 52 (*)    GFR calc Af Amer 60 (*)    All other components within normal limits  PRO B NATRIURETIC PEPTIDE  TROPONIN I  I-STAT TROPOININ, ED    Imaging Review Dg Chest 2 View  07/17/2014   CLINICAL DATA:  Chest pain, nausea and dizziness. Right arm swelling. Shortness of breath.  EXAM: CHEST  2 VIEW  COMPARISON:  Chest radiograph performed 11/12/2013, and CTA of the chest performed 11/13/2013  FINDINGS: The lungs are well-aerated and clear. There is mild elevation of the right hemidiaphragm. There is no evidence of focal opacification, pleural effusion or pneumothorax.  The heart is normal in size; the mediastinal contour is within normal limits. No acute osseous abnormalities are seen.  IMPRESSION: No acute cardiopulmonary process seen.   Electronically Signed   By: Garald Balding M.D.   On: 07/17/2014 21:31     EKG Interpretation   Date/Time:  Thursday July 17 2014 19:26:07 EDT Ventricular Rate:  97 PR Interval:  166 QRS Duration: 86 QT Interval:  340 QTC Calculation: 431 R Axis:   23 Text Interpretation:  Normal sinus rhythm Normal ECG No significant change  since last tracing Confirmed by Glynn Octave [phone]) on 07/18/2014  2:12:59 AM      MDM   Final diagnoses:  Chest pain on exertion    This is a 68 year old male with a history of hyperlipidemia coming in with a concerning story for unstable angina. Patient's heart score is 5 to be retained in the hospital for further workup of this chest pain. Patient's first troponin was negative. Patient had an aspirin full dose this morning at home so he did not get one in emergency department. Patient states he's never had provocative  testing for his chest pain. He is advised that he may get a stress test during this admission. Patient admitted to try and hospitalist under telemetry.    Everlene Balls, MD 07/18/14 2836  Everlene Balls, MD 07/18/14 [place], MD 07/18/14 [phone]

## 2014-07-18 NOTE — Progress Notes (Signed)
Site area: right groin Site Prior to Removal:  Level 0 Pressure Applied For: 20 minutes Manual:   yes Patient Status During Pull:  stable Post Pull Site:  Level  0 Post Pull Instructions Given:  yes Post Pull Pulses Present: yes and remained palpable during sheath pull Dressing Applied:  tegaderm Bedrest begins @ 16:15:00 Comments: No complications

## 2014-07-18 NOTE — Progress Notes (Signed)
UR Completed Sheetal Lyall Graves-Bigelow, RN,BSN [phone]  

## 2014-07-19 NOTE — Discharge Summary (Signed)
Physician Discharge Summary  Omar [name]. ZHY:[id number] DOB: Apr 28, 1946 DOA: 07/18/2014  PCP: Renato Shin, MD  Admit date: 07/18/2014 Discharge date: 07/19/2014  Time spent: >45 minutes  Discharge Diagnoses:  Principal Problem:   DOE (dyspnea on exertion) Active Problems:   Stricture and stenosis of esophagus   Prostate cancer   History of pulmonary embolism   Discharge Condition: stable  Diet recommendation: heart healthy  Filed Weights   07/17/14 1930 07/18/14 0455  Weight: 91.853 kg (202 lb 8 oz) 91.354 kg (201 lb 6.4 oz)    History of present illness:  Omar [name]. is a 68 y.o. male with Past medical history of dyslipidemia, GERD, prostate cancer, pulmonary embolism, coronary atherosclerosis.  The patient presented with complaints of shortness of breath on exertion for the the past week. On the night before admission, he had shortness of breath on exertion associated with some sternal epigastric chest pain and therefore he decided to come to the hospital.  Patient complaint of acid reflux and mentions taking medication helps.  Mentions he had a stress test to 3 years ago done at the [facility] although I cannot find the records.  He denies any smoking history no alcohol abuse.  Denies any prior cardiac intervention.  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   Hospital Course:  Shortness of breath and diaphoresis - 4 sets of cardiac enzymes were negative.  - with a h/o a recent PE, a D dimer was ordered and was found to be negative - cardiology consulted as concern for angina equivalent - he underwent a cardiac cath on 8/14 which revealed mild non-obstructive disease, normal LV systolic function and normal right heart pressures. - He has not had recurrence of  symptoms and will be discharged today.   Chronic medical issues remained stable  Consultations:  cardiology  Discharge Exam: Filed Vitals:   07/18/14 2000  BP: 120/76  Pulse: 65  Temp: 98.2 F (36.8 C)  Resp: 18    General: AAO x3 Cardiovascular: RRR, no murmur Respiratory: CTA b/l   Discharge Instructions You were cared for by a hospitalist during your hospital stay. If you have any questions about your discharge medications or the care you received while you were in the hospital after you are discharged, you can call the unit and asked to speak with the hospitalist on call if the hospitalist that took care of you is not available. Once you are discharged, your primary care physician will handle any further medical issues. Please note that NO REFILLS for any discharge medications will be authorized once you are discharged, as it is imperative that you return to your primary care physician (or establish a relationship with a primary care physician if you do not have one) for your aftercare needs so that they can reassess your need for medications and monitor your lab values.      Discharge Instructions   Diet - low sodium heart healthy    Complete by:  As directed      Increase activity slowly    Complete by:  As directed             Medication List         amitriptyline 50 MG tablet  Commonly known as:  ELAVIL  Take 1 tablet (50  mg total) by mouth at bedtime as needed for sleep.     aspirin 325 MG tablet  Take 325 mg by mouth daily.     esomeprazole 40 MG capsule  Commonly known as:  NEXIUM  Take 40 mg by mouth daily before breakfast.     multivitamin with minerals Tabs tablet  Take 1 tablet by mouth daily.     PRESCRIPTION MEDICATION  Inject 0.25-0.4 mLs into the skin daily as needed (erectile dysfunction). Penile injection compounded at  [facility]. Trimixture of Prostaglandin E-1, Papaverine, and Phentolamine.     simvastatin 80 MG tablet   Commonly known as:  ZOCOR  Take 80 mg by mouth at bedtime.       Allergies  Allergen Reactions  . Sulfonamide Derivatives Other (See Comments)     blisters on skin      The results of significant diagnostics from this hospitalization (including imaging, microbiology, ancillary and laboratory) are listed below for reference.    Significant Diagnostic Studies: Dg Chest 2 View  07/17/2014   CLINICAL DATA:  Chest pain, nausea and dizziness. Right arm swelling. Shortness of breath.  EXAM: CHEST  2 VIEW  COMPARISON:  Chest radiograph performed 11/12/2013, and CTA of the chest performed 11/13/2013  FINDINGS: The lungs are well-aerated and clear. There is mild elevation of the right hemidiaphragm. There is no evidence of focal opacification, pleural effusion or pneumothorax.  The heart is normal in size; the mediastinal contour is within normal limits. No acute osseous abnormalities are seen.  IMPRESSION: No acute cardiopulmonary process seen.   Electronically Signed   By: Garald Balding M.D.   On: 07/17/2014 21:31   Dg Abd 1 View  07/02/2014   CLINICAL DATA:  Right lower quadrant and right sided scrotal pain  EXAM: ABDOMEN - 1 VIEW  COMPARISON:  CT scan of the abdomen and pelvis of June 09, 2014  FINDINGS: The bowel gas pattern is within the limits of normal. A moderate stool burden is present throughout the colon. There are seminal vesicle calcifications present. No abnormal calcifications project over the kidneys or ureters or urinary bladder. The bony structures are unremarkable.  IMPRESSION: No acute intra-abdominal abnormality is demonstrated. Moderately increased stool burden may reflect clinical constipation.   Electronically Signed   By: David  Martinique   On: 07/02/2014 09:41    Microbiology: No results found for this or any previous visit (from the past 240 hour(s)).   Labs: Basic Metabolic Panel:  Recent Labs Lab 07/17/14 0829 07/17/14 1934  NA 140 141  K 4.0 4.4  CL 106 103   CO2 29 27  GLUCOSE 85 88  BUN 14 15  CREATININE 1.2 1.36*  CALCIUM 9.1 9.3   Liver Function Tests:  Recent Labs Lab 07/17/14 0829  AST 18  ALT 15  ALKPHOS 52  BILITOT 0.7  PROT 6.0  ALBUMIN 3.8   No results found for this basename: LIPASE, AMYLASE,  in the last 168 hours No results found for this basename: AMMONIA,  in the last 168 hours CBC:  Recent Labs Lab 07/17/14 0829 07/17/14 1934  WBC 2.7* 3.8*  NEUTROABS 1.2*  --   HGB 12.8* 13.2  HCT 38.8* 40.4  MCV 79.6 81.5  PLT 184.0 183   Cardiac Enzymes:  Recent Labs Lab 07/18/14 0145 07/18/14 0306 07/18/14 0608 07/18/14 0931  TROPONINI <0.30 <0.30 <0.30 <0.30   BNP: BNP (last 3 results)  Recent Labs  07/17/14 1934  PROBNP 26.8   CBG:  No results found for this basename: GLUCAP,  in the last 168 hours     Signed:  Debbe Odea, MD  [facility] 07/18/2014, 5:18 PM

## 2014-07-23 NOTE — Telephone Encounter (Signed)
Called the patient and he informed at this time he is not taking  Amitriptyline. States he has never taken it and takes nothing for sleep.

## 2014-07-23 NOTE — Telephone Encounter (Signed)
[place] for the PA, as the dose is very low

## 2014-08-19 DIAGNOSIS — Z8546 Personal history of malignant neoplasm of prostate: Secondary | ICD-10-CM | POA: Diagnosis not present

## 2014-08-22 ENCOUNTER — Telehealth: Payer: Self-pay | Admitting: Endocrinology

## 2014-08-22 NOTE — Telephone Encounter (Signed)
Patient would like to know what injection he got when he was here last month  He wants to make sure so he doesn't get a duplicate   Please leave detailed message on vm   Thank You

## 2014-08-22 NOTE — Telephone Encounter (Signed)
Called pt and advised that at last office visit he received a Prevnar injection. Pt voiced understanding.

## 2014-08-28 ENCOUNTER — Ambulatory Visit (INDEPENDENT_AMBULATORY_CARE_PROVIDER_SITE_OTHER): Payer: Medicare Other

## 2014-08-28 DIAGNOSIS — Z23 Encounter for immunization: Secondary | ICD-10-CM | POA: Diagnosis not present

## 2014-09-18 ENCOUNTER — Encounter: Payer: Self-pay | Admitting: Endocrinology

## 2014-11-13 ENCOUNTER — Encounter (HOSPITAL_COMMUNITY): Payer: Self-pay | Admitting: Cardiovascular Disease

## 2014-11-17 DIAGNOSIS — Z8546 Personal history of malignant neoplasm of prostate: Secondary | ICD-10-CM | POA: Diagnosis not present

## 2014-11-24 DIAGNOSIS — N5201 Erectile dysfunction due to arterial insufficiency: Secondary | ICD-10-CM | POA: Diagnosis not present

## 2014-11-24 DIAGNOSIS — N393 Stress incontinence (female) (male): Secondary | ICD-10-CM | POA: Diagnosis not present

## 2014-11-24 DIAGNOSIS — C61 Malignant neoplasm of prostate: Secondary | ICD-10-CM | POA: Diagnosis not present

## 2014-12-20 ENCOUNTER — Other Ambulatory Visit: Payer: Self-pay | Admitting: Endocrinology

## 2014-12-30 ENCOUNTER — Encounter: Payer: Self-pay | Admitting: Endocrinology

## 2014-12-30 ENCOUNTER — Ambulatory Visit (INDEPENDENT_AMBULATORY_CARE_PROVIDER_SITE_OTHER): Payer: Medicare Other | Admitting: Endocrinology

## 2014-12-30 VITALS — BP 126/78 | HR 79 | Temp 97.8°F | Ht 75.0 in | Wt 202.0 lb

## 2014-12-30 DIAGNOSIS — K219 Gastro-esophageal reflux disease without esophagitis: Secondary | ICD-10-CM | POA: Diagnosis not present

## 2014-12-30 MED ORDER — PANTOPRAZOLE SODIUM 40 MG PO TBEC: 40.0000 mg | DELAYED_RELEASE_TABLET | Freq: Every day | ORAL | Status: DC

## 2014-12-30 NOTE — Patient Instructions (Addendum)
i have sent a prescription to your pharmacy, for a pill that replaces nexium. I hope you feel better soon.  If you don't feel better by next week, please call back, so we can change to a different pill.

## 2014-12-30 NOTE — Progress Notes (Signed)
Subjective:    Patient ID: Omar [name]., male    DOB: [birth year], 69 y.o.   MRN: [id number]  HPI Pt has many years h/o GERD.  nexium no longer helps.  He had EGD in 2013.  Denies BRBPR or assoc weight loss.  He wants to try protonix.   Past Medical History  Diagnosis Date  . Hyperlipidemia   . GERD (gastroesophageal reflux disease)   . History of colonic polyps   . Decreased white blood cell count   . Prostatism   . BPH (benign prostatic hypertrophy)   . History of kidney stones   . History of rheumatic fever   . History of esophageal stricture 10/31/2007  . Diverticulosis   . Gastric polyps   . Prostate cancer 11/09/2012  . Depression   . Erectile dysfunction     Past Surgical History  Procedure Laterality Date  . Inguinal hernia repair    . Knee arthroscopy      left  . Tonsillectomy    . Carpal tunnel release      Bilateral  . Esophageal dilation      last dilation 8'12  . Robot assisted laparoscopic radical prostatectomy  11/08/2012    Procedure: ROBOTIC ASSISTED LAPAROSCOPIC RADICAL PROSTATECTOMY LEVEL 1;  Surgeon: Molli Hazard, MD;  Location: [facility];  Service: Urology;  Laterality: N/A;     . Umbilical hernia repair N/A 04/15/2013    Procedure: HERNIA REPAIR UMBILICAL ADULT;  Surgeon: Odis Hollingshead, MD;  Location: [place];  Service: General;  Laterality: N/A;  . Insertion of mesh N/A 04/15/2013    Procedure: INSERTION OF MESH;  Surgeon: Odis Hollingshead, MD;  Location: [place];  Service: General;  Laterality: N/A;  . Left heart catheterization with coronary angiogram N/A 07/18/2014    Procedure: LEFT HEART CATHETERIZATION WITH CORONARY ANGIOGRAM;  Surgeon: Burnell Blanks, MD;  Location: [facility];  Service: Cardiovascular;  Laterality: N/A;    History   Social History  . Marital Status: Married    Spouse Name: N/A    Number of Children: 4  . Years of Education: N/A   Occupational History  . pastor    Social History Main Topics  .  Smoking status: Former Smoker -- 1.00 packs/day for 25 years    Types: Cigarettes    Quit date: 12/05/1993  . Smokeless tobacco: Never Used  . Alcohol Use: No  . Drug Use: No  . Sexual Activity: Yes   Other Topics Concern  . Not on file   Social History Narrative    Current Outpatient Prescriptions on File Prior to Visit  Medication Sig Dispense Refill  . aspirin 325 MG tablet Take 325 mg by mouth daily.    . simvastatin (ZOCOR) 80 MG tablet TAKE ONE TABLET BY MOUTH AT BEDTIME. 30 tablet 2  . amitriptyline (ELAVIL) 50 MG tablet Take 1 tablet (50 mg total) by mouth at bedtime as needed for sleep. (Patient not taking: Reported on 12/30/2014) 90 tablet 1  . esomeprazole (NEXIUM) 40 MG capsule Take 40 mg by mouth daily before breakfast.     . Multiple Vitamin (MULTIVITAMIN WITH MINERALS) TABS tablet Take 1 tablet by mouth daily.    [place] PRESCRIPTION MEDICATION Inject 0.25-0.4 mLs into the skin daily as needed (erectile dysfunction). Penile injection compounded at  [facility]. Trimixture of Prostaglandin E-1, Papaverine, and Phentolamine.    . [DISCONTINUED] vardenafil (LEVITRA) 20 MG tablet Take 20 mg by mouth as needed.  No current facility-administered medications on file prior to visit.    Allergies  Allergen Reactions  . Sulfonamide Derivatives Other (See Comments)     blisters on skin    Family History  Problem Relation Age of Onset  . Ovarian cancer Mother   . Cancer Mother   . Lung cancer Maternal Uncle   . Lung cancer Maternal Aunt   . Colon cancer Neg Hx   . Esophageal cancer Neg Hx   . Stomach cancer Neg Hx   . Rectal cancer Neg Hx   . Stroke Father     BP 126/78 mmHg  Pulse 79  Temp(Src) 97.8 F (36.6 C) (Oral)  Ht 6\' 3"  (1.905 m)  Wt 202 lb (91.627 kg)  BMI 25.25 kg/m2  SpO2 97%  Review of Systems He denies dysphagia.     Objective:   Physical Exam VITAL SIGNS:  See vs page GENERAL: no distress ABDOMEN: abdomen is soft, nontender.  no  hepatosplenomegaly. not distended.  no hernia.       Assessment & Plan:  GERD, he needs increased rx.

## 2015-01-29 ENCOUNTER — Ambulatory Visit: Payer: Self-pay | Admitting: General Practice

## 2015-01-29 DIAGNOSIS — Z5181 Encounter for therapeutic drug level monitoring: Secondary | ICD-10-CM

## 2015-01-29 DIAGNOSIS — I2699 Other pulmonary embolism without acute cor pulmonale: Secondary | ICD-10-CM

## 2015-03-16 ENCOUNTER — Other Ambulatory Visit: Payer: Self-pay | Admitting: Endocrinology

## 2015-03-19 ENCOUNTER — Encounter: Payer: Self-pay | Admitting: Endocrinology

## 2015-03-19 ENCOUNTER — Ambulatory Visit (INDEPENDENT_AMBULATORY_CARE_PROVIDER_SITE_OTHER): Payer: Medicare Other | Admitting: Endocrinology

## 2015-03-19 VITALS — BP 128/84 | HR 76 | Temp 98.1°F | Ht 75.0 in | Wt 205.0 lb

## 2015-03-19 DIAGNOSIS — R1031 Right lower quadrant pain: Secondary | ICD-10-CM | POA: Diagnosis not present

## 2015-03-19 LAB — BASIC METABOLIC PANEL
BUN: 15 mg/dL (ref 6–23)
CO2: 32 meq/L (ref 19–32)
Calcium: 9.8 mg/dL (ref 8.4–10.5)
Chloride: 102 mEq/L (ref 96–112)
Creatinine, Ser: 1.16 mg/dL (ref 0.40–1.50)
GFR: 80.25 mL/min (ref >60.00)
GLUCOSE: 93 mg/dL (ref 70–99)
POTASSIUM: 4.4 meq/L (ref 3.5–5.1)
Sodium: 137 mEq/L (ref 135–145)

## 2015-03-19 LAB — HEPATIC FUNCTION PANEL
ALT: 14 U/L (ref 0–53)
AST: 15 U/L (ref 0–37)
Albumin: 4 g/dL (ref 3.5–5.2)
Alkaline Phosphatase: 52 U/L (ref 39–117)
Bilirubin, Direct: 0.1 mg/dL (ref 0.0–0.3)
Total Bilirubin: 0.6 mg/dL (ref 0.2–1.2)
Total Protein: 6.5 g/dL (ref 6.0–8.3)

## 2015-03-19 LAB — CBC WITH DIFFERENTIAL/PLATELET
Basophils Absolute: 0 10*3/uL (ref 0.0–0.1)
Basophils Relative: 0.8 % (ref 0.0–3.0)
EOS PCT: 6.3 % — AB (ref 0.0–5.0)
Eosinophils Absolute: 0.2 10*3/uL (ref 0.0–0.7)
HCT: 42.3 % (ref 39.0–52.0)
Hemoglobin: 14.1 g/dL (ref 13.0–17.0)
LYMPHS PCT: 40.8 % (ref 12.0–46.0)
Lymphs Abs: 1.2 10*3/uL (ref 0.7–4.0)
MCHC: 33.5 g/dL (ref 30.0–36.0)
MCV: 79.2 fl (ref 78.0–100.0)
MONOS PCT: 11.1 % (ref 3.0–12.0)
Monocytes Absolute: 0.3 10*3/uL (ref 0.1–1.0)
Neutro Abs: 1.2 10*3/uL — ABNORMAL LOW (ref 1.4–7.7)
Neutrophils Relative %: 41 % — ABNORMAL LOW (ref 43.0–77.0)
PLATELETS: 202 10*3/uL (ref 150.0–400.0)
RBC: 5.33 Mil/uL (ref 4.22–5.81)
RDW: 13.9 % (ref 11.5–15.5)
WBC: 3 10*3/uL — AB (ref 4.0–10.5)

## 2015-03-19 LAB — AMYLASE: AMYLASE: 87 U/L (ref 27–131)

## 2015-03-19 MED ORDER — TERBINAFINE HCL 250 MG PO TABS: 250.0000 mg | ORAL_TABLET | Freq: Every day | ORAL | Status: DC

## 2015-03-19 MED ORDER — OMEPRAZOLE-SODIUM BICARBONATE 40-1100 MG PO CAPS: 1.0000 | ORAL_CAPSULE | Freq: Every day | ORAL | Status: DC

## 2015-03-19 NOTE — Progress Notes (Signed)
Subjective:    Patient ID: [name], male    DOB: [birth year], 69 y.o.   MRN: [id number]  HPI Pt states 1 year of moderate sensation of a bad taste in the mouth, and assoc belching.  nexuim helped somewhat, but did not relieve sxs. Past Medical History  Diagnosis Date  . Hyperlipidemia   . GERD (gastroesophageal reflux disease)   . History of colonic polyps   . Decreased white blood cell count   . Prostatism   . BPH (benign prostatic hypertrophy)   . History of kidney stones   . History of rheumatic fever   . History of esophageal stricture 10/31/2007  . Diverticulosis   . Gastric polyps   . Prostate cancer 11/09/2012  . Depression   . Erectile dysfunction     Past Surgical History  Procedure Laterality Date  . Inguinal hernia repair    . Knee arthroscopy      left  . Tonsillectomy    . Carpal tunnel release      Bilateral  . Esophageal dilation      last dilation 8'12  . Robot assisted laparoscopic radical prostatectomy  11/08/2012    Procedure: ROBOTIC ASSISTED LAPAROSCOPIC RADICAL PROSTATECTOMY LEVEL 1;  Surgeon: Molli Hazard, MD;  Location: [facility];  Service: Urology;  Laterality: N/A;     . Umbilical hernia repair N/A 04/15/2013    Procedure: HERNIA REPAIR UMBILICAL ADULT;  Surgeon: Odis Hollingshead, MD;  Location: [place];  Service: General;  Laterality: N/A;  . Insertion of mesh N/A 04/15/2013    Procedure: INSERTION OF MESH;  Surgeon: Odis Hollingshead, MD;  Location: [place];  Service: General;  Laterality: N/A;  . Left heart catheterization with coronary angiogram N/A 07/18/2014    Procedure: LEFT HEART CATHETERIZATION WITH CORONARY ANGIOGRAM;  Surgeon: Burnell Blanks, MD;  Location: [facility] CATH LAB;  Service: Cardiovascular;  Laterality: N/A;    History   Social History  . Marital Status: Married    Spouse Name: N/A  . Number of Children: 4  . Years of Education: N/A   Occupational History  . pastor    Social History Main Topics  .  Smoking status: Former Smoker -- 1.00 packs/day for 25 years    Types: Cigarettes    Quit date: 12/05/1993  . Smokeless tobacco: Never Used  . Alcohol Use: No  . Drug Use: No  . Sexual Activity: Yes   Other Topics Concern  . Not on file   Social History Narrative    Current Outpatient Prescriptions on File Prior to Visit  Medication Sig Dispense Refill  . amitriptyline (ELAVIL) 50 MG tablet Take 1 tablet (50 mg total) by mouth at bedtime as needed for sleep. 90 tablet 1  . aspirin 325 MG tablet Take 325 mg by mouth daily.    . Multiple Vitamin (MULTIVITAMIN WITH MINERALS) TABS tablet Take 1 tablet by mouth daily.    Marland Kitchen PRESCRIPTION MEDICATION Inject 0.25-0.4 mLs into the skin daily as needed (erectile dysfunction). Penile injection compounded at  [facility]. Trimixture of Prostaglandin E-1, Papaverine, and Phentolamine.    . simvastatin (ZOCOR) 80 MG tablet TAKE ONE TABLET BY MOUTH AT BEDTIME. 30 tablet 1  . [DISCONTINUED] vardenafil (LEVITRA) 20 MG tablet Take 20 mg by mouth as needed.       No current facility-administered medications on file prior to visit.    Allergies  Allergen Reactions  . Sulfonamide Derivatives Other (See Comments)  blisters on skin    Family History  Problem Relation Age of Onset  . Ovarian cancer Mother   . Cancer Mother   . Lung cancer Maternal Uncle   . Lung cancer Maternal Aunt   . Colon cancer Neg Hx   . Esophageal cancer Neg Hx   . Stomach cancer Neg Hx   . Rectal cancer Neg Hx   . Stroke Father     BP 128/84 mmHg  Pulse 76  Temp(Src) 98.1 F (36.7 C) (Oral)  Ht 6\' 3"  (1.905 m)  Wt 205 lb (92.987 kg)  BMI 25.62 kg/m2  SpO2 97%  Review of Systems He denies abd pain and n/v, but feels a "sensation" in the mouth.  Denies weight change and BRBPR    Objective:   Physical Exam VITAL SIGNS:  See vs page GENERAL: no distress ABDOMEN: abdomen is soft, nontender.  no hepatosplenomegaly.  not distended.  no hernia. Old  healed surgical scars (prostate and hernia surg) Ext: There is bilateral onychomycosis of the toenails   Lab Results  Component Value Date   WBC 3.0* 03/19/2015   HGB 14.1 03/19/2015   HCT 42.3 03/19/2015   PLT 202.0 03/19/2015   GLUCOSE 93 03/19/2015   CHOL 154 07/17/2014   TRIG 58.0 07/17/2014   HDL 44.40 07/17/2014   LDLCALC 98 07/17/2014   ALT 14 03/19/2015   AST 15 03/19/2015   NA 137 03/19/2015   K 4.4 03/19/2015   CL 102 03/19/2015   CREATININE 1.16 03/19/2015   BUN 15 03/19/2015   CO2 32 03/19/2015   TSH 1.18 07/17/2014   PSA 0.00* 07/17/2014   INR 2.2 05/27/2014       Assessment & Plan:  Jerrye Bushy, not improved with ppi abd sensation, new, uncertain etiology Onychomycosis, persistent  Patient is advised the following: Patient Instructions  i have sent 2 prescriptions to your pharmacy: for the stomach, and for the toenail fungus. Please see a specialist.  you will receive a phone call, about a day and time for an appointment. blood tests are requested for you today.  We'll let you know about the results.

## 2015-03-19 NOTE — Patient Instructions (Signed)
i have sent 2 prescriptions to your pharmacy: for the stomach, and for the toenail fungus. Please see a specialist.  you will receive a phone call, about a day and time for an appointment. blood tests are requested for you today.  We'll let you know about the results.

## 2015-03-23 ENCOUNTER — Telehealth: Payer: Self-pay | Admitting: Endocrinology

## 2015-03-23 NOTE — Telephone Encounter (Signed)
Patient called and would like for Dr. Loanne Drilling to set up a CT scan or Xray on his "stomach"? He wants to get to the bottom of his issue    Please advise   Thank you

## 2015-03-24 ENCOUNTER — Emergency Department (HOSPITAL_COMMUNITY)
Admission: EM | Admit: 2015-03-24 | Discharge: 2015-03-24 | Disposition: A | Discharge status: Home / Self Care | Payer: Medicare Other | Attending: Emergency Medicine | Admitting: Emergency Medicine

## 2015-03-24 ENCOUNTER — Encounter (HOSPITAL_COMMUNITY): Payer: Self-pay | Admitting: Emergency Medicine

## 2015-03-24 ENCOUNTER — Emergency Department (HOSPITAL_COMMUNITY): Payer: Medicare Other

## 2015-03-24 DIAGNOSIS — Z7982 Long term (current) use of aspirin: Secondary | ICD-10-CM | POA: Diagnosis not present

## 2015-03-24 DIAGNOSIS — K219 Gastro-esophageal reflux disease without esophagitis: Secondary | ICD-10-CM | POA: Diagnosis not present

## 2015-03-24 DIAGNOSIS — Z87442 Personal history of urinary calculi: Secondary | ICD-10-CM | POA: Diagnosis not present

## 2015-03-24 DIAGNOSIS — Z8546 Personal history of malignant neoplasm of prostate: Secondary | ICD-10-CM | POA: Insufficient documentation

## 2015-03-24 DIAGNOSIS — Z79899 Other long term (current) drug therapy: Secondary | ICD-10-CM | POA: Diagnosis not present

## 2015-03-24 DIAGNOSIS — Z86010 Personal history of colonic polyps: Secondary | ICD-10-CM | POA: Insufficient documentation

## 2015-03-24 DIAGNOSIS — Z9889 Other specified postprocedural states: Secondary | ICD-10-CM | POA: Diagnosis not present

## 2015-03-24 DIAGNOSIS — Z8659 Personal history of other mental and behavioral disorders: Secondary | ICD-10-CM | POA: Insufficient documentation

## 2015-03-24 DIAGNOSIS — R1084 Generalized abdominal pain: Secondary | ICD-10-CM

## 2015-03-24 DIAGNOSIS — IMO0001 Reserved for inherently not codable concepts without codable children: Secondary | ICD-10-CM

## 2015-03-24 DIAGNOSIS — Z8619 Personal history of other infectious and parasitic diseases: Secondary | ICD-10-CM | POA: Insufficient documentation

## 2015-03-24 DIAGNOSIS — Z87438 Personal history of other diseases of male genital organs: Secondary | ICD-10-CM | POA: Insufficient documentation

## 2015-03-24 DIAGNOSIS — Z87891 Personal history of nicotine dependence: Secondary | ICD-10-CM | POA: Diagnosis not present

## 2015-03-24 DIAGNOSIS — E785 Hyperlipidemia, unspecified: Secondary | ICD-10-CM | POA: Insufficient documentation

## 2015-03-24 DIAGNOSIS — R109 Unspecified abdominal pain: Secondary | ICD-10-CM | POA: Diagnosis present

## 2015-03-24 MED ORDER — GI COCKTAIL ~~LOC~~
30.0000 mL | Freq: Once | ORAL | Status: AC
  Administered 2015-03-24: 30 mL via ORAL
  Filled 2015-03-24: qty 30

## 2015-03-24 MED ORDER — SUCRALFATE 1 G PO TABS: 1.0000 g | ORAL_TABLET | Freq: Three times a day (TID) | ORAL | Status: DC

## 2015-03-24 NOTE — ED Provider Notes (Signed)
CSN: [id number]     Arrival date & time 03/24/15  0415 History   First MD Initiated Contact with Patient 03/24/15 [phone]     Chief Complaint  Patient presents with  . Abdominal Pain     (Consider location/radiation/quality/duration/timing/severity/associated sxs/prior Treatment) HPI  This is a 69 year old male with history hypertension, hyperlipidemia, reflux who presents with abdominal pain.  Patient reports a one-year history of abdominal pain. He states that over the last month he has developed more shooting pains diffusely over his abdomen and a bitter taste in his mouth. He is currently being treated for GERD by his primary physician. He saw his primary physician 4 days ago and was referred to a specialist. He reports associated nausea with his pain. Denies any vomiting or diarrhea. Denies any bloody stools. Patient states "I just want to know what's going on May." He states that the pain sometimes radiates into his groin. He has not noted any mass or swelling in his scrotum.  Review of patient's chart reveals that he was seen on April 14. At that time had lab work including CBC, CMP and amylase which were all reassuring. He was referred to a specialist. Patient states that nothing has changed since that time.  Past Medical History  Diagnosis Date  . Hyperlipidemia   . GERD (gastroesophageal reflux disease)   . History of colonic polyps   . Decreased white blood cell count   . Prostatism   . BPH (benign prostatic hypertrophy)   . History of kidney stones   . History of rheumatic fever   . History of esophageal stricture 10/31/2007  . Diverticulosis   . Gastric polyps   . Prostate cancer 11/09/2012  . Depression   . Erectile dysfunction    Past Surgical History  Procedure Laterality Date  . Inguinal hernia repair    . Knee arthroscopy      left  . Tonsillectomy    . Carpal tunnel release      Bilateral  . Esophageal dilation      last dilation 8'12  . Robot assisted  laparoscopic radical prostatectomy  11/08/2012    Procedure: ROBOTIC ASSISTED LAPAROSCOPIC RADICAL PROSTATECTOMY LEVEL 1;  Surgeon: Molli Hazard, MD;  Location: [facility];  Service: Urology;  Laterality: N/A;     . Umbilical hernia repair N/A 04/15/2013    Procedure: HERNIA REPAIR UMBILICAL ADULT;  Surgeon: Odis Hollingshead, MD;  Location: [place];  Service: General;  Laterality: N/A;  . Insertion of mesh N/A 04/15/2013    Procedure: INSERTION OF MESH;  Surgeon: Odis Hollingshead, MD;  Location: [place];  Service: General;  Laterality: N/A;  . Left heart catheterization with coronary angiogram N/A 07/18/2014    Procedure: LEFT HEART CATHETERIZATION WITH CORONARY ANGIOGRAM;  Surgeon: Burnell Blanks, MD;  Location: [facility] CATH LAB;  Service: Cardiovascular;  Laterality: N/A;   Family History  Problem Relation Age of Onset  . Ovarian cancer Mother   . Cancer Mother   . Lung cancer Maternal Uncle   . Lung cancer Maternal Aunt   . Colon cancer Neg Hx   . Esophageal cancer Neg Hx   . Stomach cancer Neg Hx   . Rectal cancer Neg Hx   . Stroke Father    History  Substance Use Topics  . Smoking status: Former Smoker -- 1.00 packs/day for 25 years    Types: Cigarettes    Quit date: 12/05/1993  . Smokeless tobacco: Never Used  . Alcohol Use:  No    Review of Systems  Constitutional: Negative.  Negative for fever.  Respiratory: Negative.  Negative for chest tightness and shortness of breath.   Cardiovascular: Negative.  Negative for chest pain.  Gastrointestinal: Positive for nausea and abdominal pain. Negative for vomiting, diarrhea, constipation and blood in stool.  Genitourinary: Negative.  Negative for dysuria, urgency and scrotal swelling.  Musculoskeletal: Negative for back pain.  Neurological: Negative for headaches.  All other systems reviewed and are negative.     Allergies  Sulfonamide derivatives  Home Medications   Prior to Admission medications   Medication Sig  Start Date End Date Taking? Authorizing Provider  amitriptyline (ELAVIL) 50 MG tablet Take 1 tablet (50 mg total) by mouth at bedtime as needed for sleep. 07/01/14   Biagio Borg, MD  aspirin 325 MG tablet Take 325 mg by mouth daily.    Historical Provider, MD  Multiple Vitamin (MULTIVITAMIN WITH MINERALS) TABS tablet Take 1 tablet by mouth daily.    Historical Provider, MD  omeprazole-sodium bicarbonate (ZEGERID) 40-1100 MG per capsule Take 1 capsule by mouth daily before breakfast. 03/19/15   Renato Shin, MD  PRESCRIPTION MEDICATION Inject 0.25-0.4 mLs into the skin daily as needed (erectile dysfunction). Penile injection compounded at  [facility]. Trimixture of Prostaglandin E-1, Papaverine, and Phentolamine.    Historical Provider, MD  simvastatin (ZOCOR) 80 MG tablet TAKE ONE TABLET BY MOUTH AT BEDTIME. 03/16/15   Renato Shin, MD  sucralfate (CARAFATE) 1 G tablet Take 1 tablet (1 g total) by mouth 4 (four) times daily -  with meals and at bedtime. 03/24/15   Merryl Hacker, MD  terbinafine (LAMISIL) 250 MG tablet Take 1 tablet (250 mg total) by mouth daily. 03/19/15   Renato Shin, MD   BP 146/98 mmHg  Pulse 78  Temp(Src) 97.7 F (36.5 C) (Oral)  Resp 16  SpO2 100% Physical Exam  Constitutional: He is oriented to person, place, and time. He appears well-developed and well-nourished. No distress.  HENT:  Head: Normocephalic and atraumatic.  Mouth/Throat: Oropharynx is clear and moist.  Cardiovascular: Normal rate, regular rhythm and normal heart sounds.   No murmur heard. Pulmonary/Chest: Effort normal and breath sounds normal. No respiratory distress. He has no wheezes.  Abdominal: Soft. Bowel sounds are normal. He exhibits no mass. There is no tenderness. There is no rebound and no guarding.  Genitourinary: Penis normal.  No scrotal swelling or mass, no hernia palpated  Musculoskeletal: He exhibits no edema.  Neurological: He is alert and oriented to person, place, and  time.  Skin: Skin is warm and dry.  Psychiatric: He has a normal mood and affect.  Nursing note and vitals reviewed.   ED Course  Procedures (including critical care time) Labs Review Labs Reviewed - No data to display  Imaging Review No results found.   EKG Interpretation None      MDM   Final diagnoses:  Generalized abdominal pain  Reflux    Patient presents with generalized abdominal pain. This appears to be chronic in nature. He has seen his primary physician and has been referred to a specialist whom I am presuming is a GI specialist. I reviewed his lab work from 4 days ago and it is reassuring. He denies any change in his symptoms since that time and I suspect repeat lab work would be unremarkable and noncontributory.  Given nausea, will obtain a screening abdominal series to rule out obstruction and assess stool burden. Given chronicity of symptoms,  this time I do not feel need for any other urgent imaging. Discussed this with patient. Given the bitter taste in his mouth, suspect some symptoms may be related to GERD. Patient was given a GI cocktail.  Radiology with [facility] reading wait times.  AAS reviewed by myself.  Large stool burden.  Patient reports history of "problems moving my bowels."  Takes Miralax occasionally.  Instructed patient to start daily Miralax regimen as constipation may be a contributing factor.  Patient stated understanding  After history, exam, and medical workup I feel the patient has been appropriately medically screened and is safe for discharge home. Pertinent diagnoses were discussed with the patient. Patient was given return precautions.     Merryl Hacker, MD 03/24/15 [phone]

## 2015-03-24 NOTE — Discharge Instructions (Signed)
You were seen today for abdominal pain. Her lab work from several days ago was reviewed and is normal. You elected not to stay for imaging. You were given a GI cocktail. You have a strong history of GERD and given your symptoms, I feel this is likely contributing. You need to follow-up with your specialist as directed by her primary care physician.  Abdominal Pain Many things can cause abdominal pain. Usually, abdominal pain is not caused by a disease and will improve without treatment. It can often be observed and treated at home. Your health care provider will do a physical exam and possibly order blood tests and X-rays to help determine the seriousness of your pain. However, in many cases, more time must pass before a clear cause of the pain can be found. Before that point, your health care provider may not know if you need more testing or further treatment. HOME CARE INSTRUCTIONS  Monitor your abdominal pain for any changes. The following actions may help to alleviate any discomfort you are experiencing:  Only take over-the-counter or prescription medicines as directed by your health care provider.  Do not take laxatives unless directed to do so by your health care provider.  Try a clear liquid diet (broth, tea, or water) as directed by your health care provider. Slowly move to a bland diet as tolerated. SEEK MEDICAL CARE IF:  You have unexplained abdominal pain.  You have abdominal pain associated with nausea or diarrhea.  You have pain when you urinate or have a bowel movement.  You experience abdominal pain that wakes you in the night.  You have abdominal pain that is worsened or improved by eating food.  You have abdominal pain that is worsened with eating fatty foods.  You have a fever. SEEK IMMEDIATE MEDICAL CARE IF:   Your pain does not go away within 2 hours.  You keep throwing up (vomiting).  Your pain is felt only in portions of the abdomen, such as the right side or  the left lower portion of the abdomen.  You pass bloody or black tarry stools. MAKE SURE YOU:  Understand these instructions.   Will watch your condition.   Will get help right away if you are not doing well or get worse.  Document Released: 08/31/2005 Document Revised: 11/26/2013 Document Reviewed: 07/31/2013 [facility] [employer] Patient Information 2015 [place], [place]. This information is not intended to replace advice given to you by your health care provider. Make sure you discuss any questions you have with your health care provider.

## 2015-03-24 NOTE — ED Notes (Signed)
Pt. Reports abdominal pain x1 week. Pt. Reports radiates to right groin. Pt. Reports nausea, denies vomiting/diarrhea. Pt. Reports bitter taste in mouth.

## 2015-04-07 ENCOUNTER — Ambulatory Visit (INDEPENDENT_AMBULATORY_CARE_PROVIDER_SITE_OTHER): Payer: Medicare Other | Admitting: Physician Assistant

## 2015-04-07 ENCOUNTER — Encounter: Payer: Self-pay | Admitting: Physician Assistant

## 2015-04-07 ENCOUNTER — Other Ambulatory Visit (INDEPENDENT_AMBULATORY_CARE_PROVIDER_SITE_OTHER): Payer: Medicare Other

## 2015-04-07 VITALS — BP 132/80 | HR 72 | Ht 75.0 in | Wt 205.2 lb

## 2015-04-07 DIAGNOSIS — R1031 Right lower quadrant pain: Secondary | ICD-10-CM

## 2015-04-07 DIAGNOSIS — K5730 Diverticulosis of large intestine without perforation or abscess without bleeding: Secondary | ICD-10-CM | POA: Diagnosis not present

## 2015-04-07 DIAGNOSIS — R109 Unspecified abdominal pain: Secondary | ICD-10-CM

## 2015-04-07 DIAGNOSIS — K219 Gastro-esophageal reflux disease without esophagitis: Secondary | ICD-10-CM

## 2015-04-07 LAB — BASIC METABOLIC PANEL
BUN: 12 mg/dL (ref 6–23)
CALCIUM: 9.4 mg/dL (ref 8.4–10.5)
CHLORIDE: 103 meq/L (ref 96–112)
CO2: 32 mEq/L (ref 19–32)
Creatinine, Ser: 1.25 mg/dL (ref 0.40–1.50)
GFR: 73.61 mL/min (ref >60.00)
Glucose, Bld: 91 mg/dL (ref 70–99)
Potassium: 4.3 mEq/L (ref 3.5–5.1)
SODIUM: 139 meq/L (ref 135–145)

## 2015-04-07 MED ORDER — OMEPRAZOLE-SODIUM BICARBONATE 40-1100 MG PO CAPS: ORAL_CAPSULE | ORAL | Status: DC

## 2015-04-07 NOTE — Patient Instructions (Addendum)
Please go to the basement level to have your labs drawn.  We sent a prescription for Protonix 40 mg,  twice daily dosing to [place], [place].   You have been scheduled for a CT scan of the abdomen and pelvis at [facility] ([address]---this is in the same building as Press [facility]).   You are scheduled on Wed 04-08-2015 at 3:00 PM . You should arrive at 2:45 PM prior to your appointment time for registration. Please follow the written instructions below on the day of your exam:  WARNING: IF YOU ARE ALLERGIC TO IODINE/X-RAY DYE, PLEASE NOTIFY RADIOLOGY IMMEDIATELY AT [phone]! YOU WILL BE GIVEN A 13 HOUR PREMEDICATION PREP.  1) Do not eat or drink anything after 11:00 am  (4 hours prior to your test) 2) You have been given 2 bottles of oral contrast to drink. The solution may taste  better if refrigerated, but do NOT add ice or any other liquid to this solution. Shake well before drinking.    Drink 1 bottle of contrast @  1:00 Pm (2 hours prior to your exam)  Drink 1 bottle of contrast @ 2:00 Pm  (1 hour prior to your exam)  You may take any medications as prescribed with a small amount of water except for the following: Metformin, Glucophage, Glucovance, Avandamet, Riomet, Fortamet, Actoplus Met, Janumet, Glumetza or Metaglip. The above medications must be held the day of the exam AND 48 hours after the exam.  The purpose of you drinking the oral contrast is to aid in the visualization of your intestinal tract. The contrast solution may cause some diarrhea. Before your exam is started, you will be given a small amount of fluid to drink. Depending on your individual set of symptoms, you may also receive an intravenous injection of x-ray contrast/dye. Plan on being at [facility] for 30 minutes or [facility], depending on the type of exam you are having performed.  If you have any questions regarding your exam or if you need to reschedule, you may call the  CT department at [phone] between the hours of 8:00 am and 5:00 pm, Monday-Friday.  ________________________________________________________________________

## 2015-04-07 NOTE — Progress Notes (Signed)
Patient ID: Omar [name]., male   DOB: 1946-11-20, 69 y.o.   MRN: [id number]   Subjective:    Patient ID: Omar [name]., male    DOB: 04-02-1946, 69 y.o.   MRN: [id number]  HPI Omar [name] is a pleasant 69 year old African-American male known to Dr. Deatra [name] who is referred today by Dr. Loanne [name] for evaluation of right lower quadrant pain and nausea Patient had seen Dr. Loanne [name] on 03/19/2015 and then had an ER visit on 03/24/2015 both 4 right-sided abdominal pain. He had undergone plain abdominal films which did show constipation. He has had prior colonoscopy in May 2013 showed left colon diverticulosis otherwise negative exam and EGD in May 2013 showed an esophageal stricture which was Omar [name] dilated and multiple small antral polyps. Labs done 03/19/2015 CBC hepatic panel and lipase all within normal limits Patient has had a prior umbilical hernia repair has history of prostate cancer for which she underwent a radical prostatectomy, GERD, history of pulmonary embolus and coronary artery disease. Patient says that he has been having episodes of primarily right-sided abdominal pain that has been coming and going over the past few months. He says if he has pain on a particular day will be off and on all during that day he does have days with no pain. He also complains of a bitter sour taste in his mouth which is pretty persistent and a lot of burping and belching. He has proton X at home but may not be taking this regularly. No associated vomiting he has had some nausea appetite has been good and weight has been stable. He says he has had Stocks-term chronic problems with constipation with no change recently no melena or hematochezia. He takes MiraLAX either once or twice daily and generally this is effective.  Review of Systems Pertinent positive and negative review of systems were noted in the above HPI section.  All other review of systems was otherwise negative.  Outpatient Encounter Prescriptions as  of 04/07/2015  Medication Sig  . aspirin 81 MG tablet Take 81 mg by mouth daily.  . Multiple Vitamin (MULTIVITAMIN WITH MINERALS) TABS tablet Take 1 tablet by mouth daily.  . pantoprazole (PROTONIX) 40 MG tablet   . PRESCRIPTION MEDICATION Inject 0.25-0.4 mLs into the skin daily as needed (erectile dysfunction). Penile injection compounded at  [name]. Trimixture of Prostaglandin E-1, Papaverine, and Phentolamine.  . simvastatin (ZOCOR) 80 MG tablet TAKE ONE TABLET BY MOUTH AT BEDTIME.  Marland Kitchen terbinafine (LAMISIL) 250 MG tablet Take 1 tablet (250 mg total) by mouth daily.  Marland Kitchen omeprazole-sodium bicarbonate (ZEGERID) 40-1100 MG per capsule Take 1 capsule twice daily.  . [DISCONTINUED] amitriptyline (ELAVIL) 50 MG tablet Take 1 tablet (50 mg total) by mouth at bedtime as needed for sleep.  . [DISCONTINUED] aspirin 325 MG tablet Take 325 mg by mouth daily.  . [DISCONTINUED] omeprazole-sodium bicarbonate (ZEGERID) 40-1100 MG per capsule Take 1 capsule by mouth daily before breakfast.  . [DISCONTINUED] sucralfate (CARAFATE) 1 G tablet Take 1 tablet (1 g total) by mouth 4 (four) times daily -  with meals and at bedtime.   No facility-administered encounter medications on file as of 04/07/2015.   Allergies  Allergen Reactions  . Sulfonamide Derivatives Other (See Comments)     blisters on skin   Patient Active Problem List   Diagnosis Date Noted  . Chest pain on exertion 07/18/2014  . DOE (dyspnea on exertion) 07/18/2014  . History of pulmonary embolism 07/18/2014  . Numbness and  tingling of right arm 07/17/2014  . RLQ abdominal pain 07/04/2014  . Pain in right testicle 07/04/2014  . 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 Coronary atherosclerosis of native coronary artery 03/18/2014  . Encounter for therapeutic drug monitoring 01/07/2014  . Acute pulmonary embolism, main risk factor [place] car rides 11/13/2013  . Hyperlipidemia 11/13/2013  . 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 Umbilical  hernia 80/02/4916  . Prostate cancer 11/09/2012  . Stricture and stenosis of esophagus 05/31/2012  . Encounter for Brendel-term (current) use of other medications 02/29/2012  . BENIGN POSITIONAL VERTIGO 08/17/2009  . LEUKOPENIA, MILD 05/27/2009  . Esophageal reflux 10/09/2007  . HYPERLIPIDEMIA 07/16/2007  . DEPRESSION 07/16/2007  . COLONIC POLYPS, HX OF 07/16/2007   History   Social History  . Marital Status: Married    Spouse Name: N/A  . Number of Children: 4  . Years of Education: N/A   Occupational History  . pastor    Social History Main Topics  . Smoking status: Former Smoker -- 1.00 packs/day for 25 years    Types: Cigarettes    Quit date: 12/05/1993  . Smokeless tobacco: Never Used  . Alcohol Use: No  . Drug Use: No  . Sexual Activity: Yes   Other Topics Concern  . Not on file   Social History Narrative    Omar [name] family history includes Cancer in his mother; Lung cancer in his maternal aunt and maternal uncle; Ovarian cancer in his mother; Stroke in his father. There is no history of Colon cancer, Esophageal cancer, Stomach cancer, or Rectal cancer.      Objective:    Filed Vitals:   04/07/15 0925  BP: 132/80  Pulse: 72    Physical Exam  well-developed older African-American male in no acute distress, pleasant blood pressure 132/80 pulse 72 height 6 foot 3 weight 205. HEENT; nontraumatic normocephalic EOMI PERRLA sclera anicteric, Supple ;no JVD, Cardiovascular; regular rate and rhythm with S1-S2 no murmur rub or gallop, Pulmonary ;clear bilaterally, Abdomen; soft and basically nontender there is some slight fullness in the right lower quadrant no guarding or rebound no palpable mass or hepatosplenomegaly bowel sounds are present, Rectal; exam not done, Ext;no clubbing cyanosis or edema skin warm and dry, Psych; mood and affect appropriate       Assessment & Plan:   #1 69 yo male with intermittent Right sided abdominal pain radiating to groin x 2-3  months. Etiology not clear #2 GERD-poorly controlled #3 Hx prostate CA -s/p prostatectomy #4 s/p umbilical hernia repair #[id number] #6hx esophageal stricture- asymptomatic #7CAD #8 chronic constipation   Plan; Ct abd /pelvis   Continue Miralax  1-2 doses daily BMET  Amy S Esterwood PA-C 04/07/2015   Cc: Renato Shin, MD

## 2015-04-08 ENCOUNTER — Ambulatory Visit (INDEPENDENT_AMBULATORY_CARE_PROVIDER_SITE_OTHER)
Admission: RE | Admit: 2015-04-08 | Discharge: 2015-04-08 | Disposition: A | Discharge status: Home / Self Care | Payer: Medicare Other | Source: Ambulatory Visit | Attending: Physician Assistant | Admitting: Physician Assistant

## 2015-04-08 DIAGNOSIS — K5900 Constipation, unspecified: Secondary | ICD-10-CM | POA: Diagnosis not present

## 2015-04-08 DIAGNOSIS — E279 Disorder of adrenal gland, unspecified: Secondary | ICD-10-CM | POA: Diagnosis not present

## 2015-04-08 DIAGNOSIS — R1031 Right lower quadrant pain: Secondary | ICD-10-CM

## 2015-04-08 DIAGNOSIS — R109 Unspecified abdominal pain: Secondary | ICD-10-CM | POA: Diagnosis not present

## 2015-04-08 MED ORDER — IOHEXOL 300 MG/ML  SOLN
100.0000 mL | Freq: Once | INTRAMUSCULAR | Status: AC | PRN
  Administered 2015-04-08: 100 mL via INTRAVENOUS

## 2015-04-08 NOTE — Progress Notes (Signed)
Reviewed and agree with management. Narely Nobles D. Nataleah Scioneaux, M.D., FACG  

## 2015-04-27 ENCOUNTER — Ambulatory Visit: Payer: BLUE CROSS/BLUE SHIELD | Admitting: Internal Medicine

## 2015-05-11 ENCOUNTER — Ambulatory Visit: Payer: BLUE CROSS/BLUE SHIELD | Admitting: Endocrinology

## 2015-05-18 ENCOUNTER — Other Ambulatory Visit: Payer: Self-pay | Admitting: Endocrinology

## 2015-07-13 ENCOUNTER — Other Ambulatory Visit: Payer: Self-pay | Admitting: Endocrinology

## 2015-08-26 ENCOUNTER — Encounter: Payer: Self-pay | Admitting: Endocrinology

## 2015-08-26 ENCOUNTER — Ambulatory Visit (INDEPENDENT_AMBULATORY_CARE_PROVIDER_SITE_OTHER): Payer: Medicare Other | Admitting: Endocrinology

## 2015-08-26 VITALS — BP 128/84 | HR 76 | Temp 97.6°F | Ht 75.0 in | Wt 205.0 lb

## 2015-08-26 DIAGNOSIS — R0600 Dyspnea, unspecified: Secondary | ICD-10-CM

## 2015-08-26 DIAGNOSIS — Z0000 Encounter for general adult medical examination without abnormal findings: Secondary | ICD-10-CM

## 2015-08-26 DIAGNOSIS — F329 Major depressive disorder, single episode, unspecified: Secondary | ICD-10-CM | POA: Diagnosis not present

## 2015-08-26 DIAGNOSIS — Z23 Encounter for immunization: Secondary | ICD-10-CM

## 2015-08-26 DIAGNOSIS — E785 Hyperlipidemia, unspecified: Secondary | ICD-10-CM | POA: Diagnosis not present

## 2015-08-26 DIAGNOSIS — I2510 Atherosclerotic heart disease of native coronary artery without angina pectoris: Secondary | ICD-10-CM | POA: Diagnosis not present

## 2015-08-26 DIAGNOSIS — F32A Depression, unspecified: Secondary | ICD-10-CM

## 2015-08-26 LAB — LIPID PANEL
CHOL/HDL RATIO: 3
Cholesterol: 147 mg/dL (ref 0–200)
HDL: 42.3 mg/dL (ref >39.00)
LDL CALC: 85 mg/dL (ref 0–99)
NonHDL: 104.29
Triglycerides: 97 mg/dL (ref 0.0–149.0)
VLDL: 19.4 mg/dL (ref 0.0–40.0)

## 2015-08-26 LAB — TSH: TSH: 0.74 u[IU]/mL (ref 0.35–4.50)

## 2015-08-26 NOTE — Progress Notes (Signed)
we discussed code status.  pt requests full code, but would not want to be started or maintained on artificial life-support measures if there was not a reasonable chance of recovery 

## 2015-08-26 NOTE — Patient Instructions (Addendum)
good diet and exercise significantly improve your health.  please let me know if you wish to be referred to a dietician.  high blood sugar is very risky to your health.  you should see an eye doctor and dentist every year.  It is very important to get all recommended vaccinations.  please consider these measures for your health:  minimize alcohol.  do not use tobacco products.  have a colonoscopy at least every 10 years from age 69.  keep firearms safely stored.  always use seat belts.  have working smoke alarms in your home.  see an eye doctor and dentist regularly.  never drive under the influence of alcohol or drugs (including prescription drugs).   Please return in 1 year.  blood tests are requested for you today.  We'll let you know about the results.

## 2015-08-26 NOTE — Progress Notes (Signed)
Subjective:    Patient ID: Omar Carbon., male    DOB: November 06, 1946, 69 y.o.   MRN: [id number]  HPI The state of at least three ongoing medical problems is addressed today, with interval history of each noted here:  Dyslipidemia: denies chest pain.  Depression (since the murder of his son 22 mos ago): is improved. Doe is mild now.  Past Medical History  Diagnosis Date  . Hyperlipidemia   . GERD (gastroesophageal reflux disease)   . History of colonic polyps   . Decreased white blood cell count   . Prostatism   . BPH (benign prostatic hypertrophy)   . History of kidney stones   . History of rheumatic fever   . History of esophageal stricture 10/31/2007  . Diverticulosis   . Gastric polyps   . Prostate cancer 11/09/2012  . Depression   . Erectile dysfunction     Past Surgical History  Procedure Laterality Date  . Inguinal hernia repair    . Knee arthroscopy      left  . Tonsillectomy    . Carpal tunnel release      Bilateral  . Esophageal dilation      last dilation 8'12  . Robot assisted laparoscopic radical prostatectomy  11/08/2012    Procedure: ROBOTIC ASSISTED LAPAROSCOPIC RADICAL PROSTATECTOMY LEVEL 1;  Surgeon: Molli Hazard, MD;  Location: [facility];  Service: Urology;  Laterality: N/A;     . Umbilical hernia repair N/A 04/15/2013    Procedure: HERNIA REPAIR UMBILICAL ADULT;  Surgeon: Odis Hollingshead, MD;  Location: [place];  Service: General;  Laterality: N/A;  . Insertion of mesh N/A 04/15/2013    Procedure: INSERTION OF MESH;  Surgeon: Odis Hollingshead, MD;  Location: [place];  Service: General;  Laterality: N/A;  . Left heart catheterization with coronary angiogram N/A 07/18/2014    Procedure: LEFT HEART CATHETERIZATION WITH CORONARY ANGIOGRAM;  Surgeon: Burnell Blanks, MD;  Location: [facility] CATH LAB;  Service: Cardiovascular;  Laterality: N/A;    Social History   Social History  . Marital Status: Married    Spouse Name: N/A  . Number of Children:  4  . Years of Education: N/A   Occupational History  . pastor    Social History Main Topics  . Smoking status: Former Smoker -- 1.00 packs/day for 25 years    Types: Cigarettes    Quit date: 12/05/1993  . Smokeless tobacco: Never Used  . Alcohol Use: No  . Drug Use: No  . Sexual Activity: Yes   Other Topics Concern  . Not on file   Social History Narrative    Current Outpatient Prescriptions on File Prior to Visit  Medication Sig Dispense Refill  . aspirin 81 MG tablet Take 81 mg by mouth daily.    . Multiple Vitamin (MULTIVITAMIN WITH MINERALS) TABS tablet Take 1 tablet by mouth daily.    Marland Kitchen omeprazole-sodium bicarbonate (ZEGERID) 40-1100 MG per capsule Take 1 capsule twice daily. 60 capsule 2  . pantoprazole (PROTONIX) 40 MG tablet     . PRESCRIPTION MEDICATION Inject 0.25-0.4 mLs into the skin daily as needed (erectile dysfunction). Penile injection compounded at  [facility]. Trimixture of Prostaglandin E-1, Papaverine, and Phentolamine.    . simvastatin (ZOCOR) 80 MG tablet TAKE ONE TABLET BY MOUTH AT BEDTIME. 30 tablet 3  . [DISCONTINUED] vardenafil (LEVITRA) 20 MG tablet Take 20 mg by mouth as needed.       No current facility-administered medications on  file prior to visit.    Allergies  Allergen Reactions  . Sulfonamide Derivatives Other (See Comments)     blisters on skin    Family History  Problem Relation Age of Onset  . Ovarian cancer Mother   . Cancer Mother   . Lung cancer Maternal Uncle   . Lung cancer Maternal Aunt   . Colon cancer Neg Hx   . Esophageal cancer Neg Hx   . Stomach cancer Neg Hx   . Rectal cancer Neg Hx   . Stroke Father     BP 128/84 mmHg  Pulse 76  Temp(Src) 97.6 F (36.4 C) (Oral)  Ht 6\' 3"  (1.905 m)  Wt 205 lb (92.987 kg)  BMI 25.62 kg/m2  SpO2 97%  Review of Systems Denies weight change and edema.      Objective:   Physical Exam VITAL SIGNS:  See vs page GENERAL: no distress LUNGS:  Clear to  auscultation HEART:  Regular rate and rhythm without murmurs noted. Normal S1,S2.     i personally reviewed electrocardiogram tracing (today): Indication: CAD Impression: normal except for low voltage in limb leads.  Lab Results  Component Value Date   CHOL 147 08/26/2015   HDL 42.30 08/26/2015   LDLCALC 85 08/26/2015   TRIG 97.0 08/26/2015   CHOLHDL 3 08/26/2015   i personally reviewed spirometry tracing (today): Indication: sob Impression: normal    Assessment & Plan:  Dyslipidemia: well-controlled: Please continue the same medication.  Depression: improved: Please continue the same medication.  Doe: I told pt no cause has been found, so we'll follow.     Subjective:   Patient here for Medicare annual wellness visit and management of other chronic and acute problems.     Risk factors: advanced age    45 of Physicians Providing Medical Care to Patient:  See "snapshot"   Activities of Daily Living: In your present state of health, do you have any difficulty performing the following activities?:  Preparing food and eating?: No  Bathing yourself: No  Getting dressed: No  Using the toilet:No  Moving around from place to place: No  In the past year have you fallen or had a near fall?:No    Home Safety: Has smoke detector and wears seat belts.  Firearms are safely stored.  Diet and Exercise  Current exercise habits: pt says good Dietary issues discussed: pt reports a healthy diet   Depression Screen  Q1: Over the past two weeks, have you felt down, depressed or hopeless?no  Q2: Over the past two weeks, have you felt little interest or pleasure in doing things? no   The following portions of the patient's history were reviewed and updated as appropriate: allergies, current medications, past family history, past medical history, past social history, past surgical history and problem list.   Review of Systems  Denies hearing loss, and visual loss Objective:    Vision:  Advertising account executive, but does not recall name Hearing: grossly normal Body mass index:  See vs page Msk: pt easily and quickly performs "get-up-and-go" from a sitting position Cognitive Impairment Assessment: cognition, memory and judgment appear normal.  remembers 3/3 at 5 minutes.  excellent recall.  can easily read and write a sentence.  alert and oriented x 3.     Assessment:   Medicare wellness utd on preventive parameters    Plan:   During the course of the visit the patient was educated and counseled about appropriate screening and preventive services including:  Fall prevention   Diabetes screening  Nutrition counseling   Vaccines / LABS Zostavax / Pneumococcal Vaccine  today  PSA  Patient Instructions (the written plan) was given to the patient.

## 2015-11-09 ENCOUNTER — Other Ambulatory Visit: Payer: Self-pay | Admitting: Endocrinology

## 2015-11-13 ENCOUNTER — Telehealth: Payer: Self-pay | Admitting: Endocrinology

## 2015-11-13 ENCOUNTER — Ambulatory Visit (INDEPENDENT_AMBULATORY_CARE_PROVIDER_SITE_OTHER): Payer: Medicare Other | Admitting: Endocrinology

## 2015-11-13 ENCOUNTER — Encounter: Payer: Self-pay | Admitting: Endocrinology

## 2015-11-13 VITALS — BP 136/87 | HR 73 | Temp 97.7°F | Ht 75.0 in | Wt 208.0 lb

## 2015-11-13 DIAGNOSIS — R6889 Other general symptoms and signs: Secondary | ICD-10-CM

## 2015-11-13 MED ORDER — NIACIN ER (ANTIHYPERLIPIDEMIC) 500 MG PO TBCR: 500.0000 mg | EXTENDED_RELEASE_TABLET | Freq: Every day | ORAL | Status: DC

## 2015-11-13 MED ORDER — LINACLOTIDE 145 MCG PO CAPS: 145.0000 ug | ORAL_CAPSULE | Freq: Every day | ORAL | Status: DC

## 2015-11-13 MED ORDER — SIMVASTATIN 20 MG PO TABS: 20.0000 mg | ORAL_TABLET | Freq: Every day | ORAL | Status: DC

## 2015-11-13 NOTE — Patient Instructions (Addendum)
i have sent prescription to your pharmacy for the cold sensation, the constipation, and to reduce the simvastatin.  You should start with the niacin, as this may helps both symptoms.

## 2015-11-13 NOTE — Progress Notes (Signed)
Subjective:    Patient ID: Omar [name]., male    DOB: 1946/09/28, 69 y.o.   MRN: [id number]  HPI Pt states few years of moderate cold intolerance of the feet, and assoc burning sensation of the hands. Past Medical History  Diagnosis Date  . Hyperlipidemia   . GERD (gastroesophageal reflux disease)   . History of colonic polyps   . Decreased white blood cell count   . Prostatism   . BPH (benign prostatic hypertrophy)   . History of kidney stones   . History of rheumatic fever   . History of esophageal stricture 10/31/2007  . Diverticulosis   . Gastric polyps   . Prostate cancer ([place]) 11/09/2012  . Depression   . Erectile dysfunction     Past Surgical History  Procedure Laterality Date  . Inguinal hernia repair    . Knee arthroscopy      left  . Tonsillectomy    . Carpal tunnel release      Bilateral  . Esophageal dilation      last dilation 8'12  . Robot assisted laparoscopic radical prostatectomy  11/08/2012    Procedure: ROBOTIC ASSISTED LAPAROSCOPIC RADICAL PROSTATECTOMY LEVEL 1;  Surgeon: Molli Hazard, MD;  Location: [facility];  Service: Urology;  Laterality: N/A;     . Umbilical hernia repair N/A 04/15/2013    Procedure: HERNIA REPAIR UMBILICAL ADULT;  Surgeon: Odis Hollingshead, MD;  Location: [place];  Service: General;  Laterality: N/A;  . Insertion of mesh N/A 04/15/2013    Procedure: INSERTION OF MESH;  Surgeon: Odis Hollingshead, MD;  Location: [place];  Service: General;  Laterality: N/A;  . Left heart catheterization with coronary angiogram N/A 07/18/2014    Procedure: LEFT HEART CATHETERIZATION WITH CORONARY ANGIOGRAM;  Surgeon: Burnell Blanks, MD;  Location: [facility] CATH LAB;  Service: Cardiovascular;  Laterality: N/A;    Social History   Social History  . Marital Status: Married    Spouse Name: N/A  . Number of Children: 4  . Years of Education: N/A   Occupational History  . pastor    Social History Main Topics  . Smoking status: Former  Smoker -- 1.00 packs/day for 25 years    Types: Cigarettes    Quit date: 12/05/1993  . Smokeless tobacco: Never Used  . Alcohol Use: No  . Drug Use: No  . Sexual Activity: Yes   Other Topics Concern  . Not on file   Social History Narrative    Current Outpatient Prescriptions on File Prior to Visit  Medication Sig Dispense Refill  . aspirin 81 MG tablet Take 81 mg by mouth daily.    . Multiple Vitamin (MULTIVITAMIN WITH MINERALS) TABS tablet Take 1 tablet by mouth daily.    Marland Kitchen omeprazole-sodium bicarbonate (ZEGERID) 40-1100 MG per capsule Take 1 capsule twice daily. 60 capsule 2  . pantoprazole (PROTONIX) 40 MG tablet     . PRESCRIPTION MEDICATION Inject 0.25-0.4 mLs into the skin daily as needed (erectile dysfunction). Penile injection compounded at  [facility]. Trimixture of Prostaglandin E-1, Papaverine, and Phentolamine.    . [DISCONTINUED] vardenafil (LEVITRA) 20 MG tablet Take 20 mg by mouth as needed.       No current facility-administered medications on file prior to visit.    Allergies  Allergen Reactions  . Sulfonamide Derivatives Other (See Comments)     blisters on skin    Family History  Problem Relation Age of Onset  . Ovarian cancer  Mother   . Cancer Mother   . Lung cancer Maternal Uncle   . Lung cancer Maternal Aunt   . Colon cancer Neg Hx   . Esophageal cancer Neg Hx   . Stomach cancer Neg Hx   . Rectal cancer Neg Hx   . Stroke Father     BP 136/87 mmHg  Pulse 73  Temp(Src) 97.7 F (36.5 C) (Oral)  Ht 6\' 3"  (1.905 m)  Wt 208 lb (94.348 kg)  BMI 26.00 kg/m2  SpO2 97%  Review of Systems He has chronic constipation.  No BRBPR.  No abd pain.      Objective:   Physical Exam VITAL SIGNS:  See vs page GENERAL: no distress Pulses: dorsalis pedis and radials are intact bilat.   MSK: no deformity of the feet or hands CV: no leg edema Skin:  no ulcer on the feet.  normal color and temp on the feet and hands. Neuro: sensation is intact to  touch on the feet and hands.       Assessment & Plan:  Cold intolerance, new Constipation, new Dyslipidemia: with the niacin, he can reduce the zocor.    Patient is advised the following: Patient Instructions  i have sent prescription to your pharmacy for the cold sensation, the constipation, and to reduce the simvastatin.  You should start with the niacin, as this may helps both symptoms.

## 2015-11-13 NOTE — Telephone Encounter (Signed)
See note below and please advise, Thanks! 

## 2015-11-13 NOTE — Telephone Encounter (Signed)
I contacted the pt and advised 500 mg of Niacin is correct.  Pt voiced understanding.

## 2015-11-13 NOTE — Telephone Encounter (Signed)
yes

## 2015-11-13 NOTE — Telephone Encounter (Signed)
Patient ask is 500 mg the right dosage for this medication Niacin, please advise

## 2015-11-15 DIAGNOSIS — R6889 Other general symptoms and signs: Secondary | ICD-10-CM | POA: Insufficient documentation

## 2015-11-24 DIAGNOSIS — C61 Malignant neoplasm of prostate: Secondary | ICD-10-CM | POA: Diagnosis not present

## 2015-12-01 DIAGNOSIS — N393 Stress incontinence (female) (male): Secondary | ICD-10-CM | POA: Diagnosis not present

## 2015-12-01 DIAGNOSIS — N5201 Erectile dysfunction due to arterial insufficiency: Secondary | ICD-10-CM | POA: Diagnosis not present

## 2015-12-01 DIAGNOSIS — C61 Malignant neoplasm of prostate: Secondary | ICD-10-CM | POA: Diagnosis not present

## 2015-12-03 ENCOUNTER — Encounter: Payer: Self-pay | Admitting: *Deleted

## 2015-12-07 ENCOUNTER — Other Ambulatory Visit: Payer: Self-pay | Admitting: Endocrinology

## 2016-02-08 ENCOUNTER — Ambulatory Visit
Admission: RE | Admit: 2016-02-08 | Discharge: 2016-02-08 | Disposition: A | Discharge status: Home / Self Care | Payer: Medicare Other | Source: Ambulatory Visit | Attending: Endocrinology | Admitting: Endocrinology

## 2016-02-08 ENCOUNTER — Ambulatory Visit (INDEPENDENT_AMBULATORY_CARE_PROVIDER_SITE_OTHER): Payer: Medicare Other | Admitting: Endocrinology

## 2016-02-08 ENCOUNTER — Encounter: Payer: Self-pay | Admitting: Endocrinology

## 2016-02-08 ENCOUNTER — Other Ambulatory Visit: Payer: Self-pay | Admitting: Endocrinology

## 2016-02-08 VITALS — BP 145/97 | HR 79 | Temp 97.5°F | Ht 75.0 in | Wt 209.0 lb

## 2016-02-08 DIAGNOSIS — R079 Chest pain, unspecified: Secondary | ICD-10-CM | POA: Insufficient documentation

## 2016-02-08 DIAGNOSIS — R42 Dizziness and giddiness: Secondary | ICD-10-CM | POA: Diagnosis not present

## 2016-02-08 DIAGNOSIS — R0609 Other forms of dyspnea: Secondary | ICD-10-CM | POA: Diagnosis not present

## 2016-02-08 DIAGNOSIS — R0602 Shortness of breath: Secondary | ICD-10-CM | POA: Diagnosis not present

## 2016-02-08 LAB — CBC WITH DIFFERENTIAL/PLATELET
BASOS ABS: 0 10*3/uL (ref 0.0–0.1)
Basophils Relative: 0.9 % (ref 0.0–3.0)
EOS PCT: 7.3 % — AB (ref 0.0–5.0)
Eosinophils Absolute: 0.2 10*3/uL (ref 0.0–0.7)
HCT: 45.8 % (ref 39.0–52.0)
HEMOGLOBIN: 15.3 g/dL (ref 13.0–17.0)
LYMPHS ABS: 1.4 10*3/uL (ref 0.7–4.0)
Lymphocytes Relative: 41.4 % (ref 12.0–46.0)
MCHC: 33.5 g/dL (ref 30.0–36.0)
MCV: 82.9 fl (ref 78.0–100.0)
MONO ABS: 0.3 10*3/uL (ref 0.1–1.0)
MONOS PCT: 9.7 % (ref 3.0–12.0)
NEUTROS PCT: 40.7 % — AB (ref 43.0–77.0)
Neutro Abs: 1.4 10*3/uL (ref 1.4–7.7)
Platelets: 214 10*3/uL (ref 150.0–400.0)
RBC: 5.53 Mil/uL (ref 4.22–5.81)
RDW: 14.1 % (ref 11.5–15.5)
WBC: 3.4 10*3/uL — AB (ref 4.0–10.5)

## 2016-02-08 LAB — BASIC METABOLIC PANEL
BUN: 14 mg/dL (ref 6–23)
CALCIUM: 9.7 mg/dL (ref 8.4–10.5)
CO2: 32 mEq/L (ref 19–32)
Chloride: 102 mEq/L (ref 96–112)
Creatinine, Ser: 1.27 mg/dL (ref 0.40–1.50)
GFR: 72.1 mL/min (ref >60.00)
GLUCOSE: 93 mg/dL (ref 70–99)
POTASSIUM: 4.5 meq/L (ref 3.5–5.1)
SODIUM: 139 meq/L (ref 135–145)

## 2016-02-08 NOTE — Patient Instructions (Addendum)
blood tests and a chest x-ray, are requested for you today.  We'll let you know about the results. Let's check a special type of heart test.  you will receive a phone call, about a day and time for an appointment.  There are many possible causes for dizziness, so please call if the symptoms keep happening.  Go to the ER if you pass out, or almost pass out.

## 2016-02-08 NOTE — Progress Notes (Signed)
Subjective:    Patient ID: Omar Carbon., male    DOB: 1946/06/17, 70 y.o.   MRN: [id number]  HPI Yesterday, pt was singing at church, when he had a 4-minute episode of slight pain at the left ant chest, and assoc sob.  He feels better now.  Past Medical History  Diagnosis Date  . Hyperlipidemia   . GERD (gastroesophageal reflux disease)   . History of colonic polyps   . Decreased white blood cell count   . Prostatism   . BPH (benign prostatic hypertrophy)   . History of kidney stones   . History of rheumatic fever   . History of esophageal stricture 10/31/2007  . Diverticulosis   . Gastric polyps   . Prostate cancer ([place]) 11/09/2012  . Depression   . Erectile dysfunction     Past Surgical History  Procedure Laterality Date  . Inguinal hernia repair    . Knee arthroscopy      left  . Tonsillectomy    . Carpal tunnel release      Bilateral  . Esophageal dilation      last dilation 8'12  . Robot assisted laparoscopic radical prostatectomy  11/08/2012    Procedure: ROBOTIC ASSISTED LAPAROSCOPIC RADICAL PROSTATECTOMY LEVEL 1;  Surgeon: Molli Hazard, MD;  Location: [facility];  Service: Urology;  Laterality: N/A;     . Umbilical hernia repair N/A 04/15/2013    Procedure: HERNIA REPAIR UMBILICAL ADULT;  Surgeon: Odis Hollingshead, MD;  Location: [place];  Service: General;  Laterality: N/A;  . Insertion of mesh N/A 04/15/2013    Procedure: INSERTION OF MESH;  Surgeon: Odis Hollingshead, MD;  Location: [place];  Service: General;  Laterality: N/A;  . Left heart catheterization with coronary angiogram N/A 07/18/2014    Procedure: LEFT HEART CATHETERIZATION WITH CORONARY ANGIOGRAM;  Surgeon: Burnell Blanks, MD;  Location: [facility] CATH LAB;  Service: Cardiovascular;  Laterality: N/A;    Social History   Social History  . Marital Status: Married    Spouse Name: N/A  . Number of Children: 4  . Years of Education: N/A   Occupational History  . pastor    Social History  Main Topics  . Smoking status: Former Smoker -- 1.00 packs/day for 25 years    Types: Cigarettes    Quit date: 12/05/1993  . Smokeless tobacco: Never Used  . Alcohol Use: No  . Drug Use: No  . Sexual Activity: Yes   Other Topics Concern  . Not on file   Social History Narrative    Current Outpatient Prescriptions on File Prior to Visit  Medication Sig Dispense Refill  . aspirin 81 MG tablet Take 81 mg by mouth daily.    . Linaclotide (LINZESS) 145 MCG CAPS capsule Take 1 capsule (145 mcg total) by mouth daily. 30 capsule 11  . Multiple Vitamin (MULTIVITAMIN WITH MINERALS) TABS tablet Take 1 tablet by mouth daily.    . niacin (NIASPAN) 500 MG CR tablet Take 1 tablet (500 mg total) by mouth at bedtime. 30 tablet 11  . omeprazole-sodium bicarbonate (ZEGERID) 40-1100 MG per capsule Take 1 capsule twice daily. 60 capsule 2  . PRESCRIPTION MEDICATION Inject 0.25-0.4 mLs into the skin daily as needed (erectile dysfunction). Penile injection compounded at  [facility]. Trimixture of Prostaglandin E-1, Papaverine, and Phentolamine.    . simvastatin (ZOCOR) 20 MG tablet Take 1 tablet (20 mg total) by mouth at bedtime. 90 tablet 3  . [DISCONTINUED] vardenafil (  LEVITRA) 20 MG tablet Take 20 mg by mouth as needed.       No current facility-administered medications on file prior to visit.    Allergies  Allergen Reactions  . Sulfonamide Derivatives Other (See Comments)     blisters on skin    Family History  Problem Relation Age of Onset  . Ovarian cancer Mother   . Cancer Mother   . Lung cancer Maternal Uncle   . Lung cancer Maternal Aunt   . Colon cancer Neg Hx   . Esophageal cancer Neg Hx   . Stomach cancer Neg Hx   . Rectal cancer Neg Hx   . Stroke Father     BP 145/97 mmHg  Pulse 79  Temp(Src) 97.5 F (36.4 C) (Oral)  Ht 6\' 3"  (1.905 m)  Wt 209 lb (94.802 kg)  BMI 26.12 kg/m2  SpO2 97%  Review of Systems He also had assoc lightheadedness, but no LOC.        Objective:   Physical Exam VITAL SIGNS:  See vs page GENERAL: no distress LUNGS:  Clear to auscultation Chest wall: nontender LUNGS:  Clear to auscultation.  Gait: normal and steady.  NEURO:  cn 2-12 grossly intact.   readily moves all 4's.  sensation is intact to touch on the feet.   PSYCH: alert, well-oriented.  Does not appear anxious nor depressed.    Heart cath (2015): mild CAD.  i personally reviewed electrocardiogram tracing (today): Indication: chest pain Impression: normal to my reading  Lab Results  Component Value Date   WBC 3.4* 02/08/2016   HGB 15.3 02/08/2016   HCT 45.8 02/08/2016   MCV 82.9 02/08/2016   PLT 214.0 02/08/2016   Lab Results  Component Value Date   CREATININE 1.27 02/08/2016   BUN 14 02/08/2016   NA 139 02/08/2016   K 4.5 02/08/2016   CL 102 02/08/2016   CO2 32 02/08/2016       Assessment & Plan:  Chest pain, atypical for angina Dizziness, new, uncertain etiology.   Patient is advised the following: Patient Instructions  blood tests and a chest x-ray, are requested for you today.  We'll let you know about the results. Let's check a special type of heart test.  you will receive a phone call, about a day and time for an appointment.  There are many possible causes for dizziness, so please call if the symptoms keep happening.  Go to the ER if you pass out, or almost pass out.

## 2016-03-07 ENCOUNTER — Other Ambulatory Visit: Payer: Self-pay | Admitting: Endocrinology

## 2016-04-08 ENCOUNTER — Other Ambulatory Visit: Payer: Self-pay | Admitting: Endocrinology

## 2016-05-09 ENCOUNTER — Other Ambulatory Visit: Payer: Self-pay | Admitting: Endocrinology

## 2016-05-18 DIAGNOSIS — L821 Other seborrheic keratosis: Secondary | ICD-10-CM | POA: Diagnosis not present

## 2016-06-08 ENCOUNTER — Other Ambulatory Visit: Payer: Self-pay | Admitting: Endocrinology

## 2016-08-03 DIAGNOSIS — Z23 Encounter for immunization: Secondary | ICD-10-CM | POA: Diagnosis not present

## 2016-08-16 ENCOUNTER — Telehealth: Payer: Self-pay | Admitting: Gastroenterology

## 2016-08-16 ENCOUNTER — Encounter: Payer: Self-pay | Admitting: Endocrinology

## 2016-08-16 ENCOUNTER — Ambulatory Visit (INDEPENDENT_AMBULATORY_CARE_PROVIDER_SITE_OTHER): Payer: Medicare Other | Admitting: Endocrinology

## 2016-08-16 VITALS — BP 142/94 | HR 88 | Ht 75.0 in | Wt 208.0 lb

## 2016-08-16 DIAGNOSIS — R200 Anesthesia of skin: Secondary | ICD-10-CM

## 2016-08-16 DIAGNOSIS — I2510 Atherosclerotic heart disease of native coronary artery without angina pectoris: Secondary | ICD-10-CM

## 2016-08-16 DIAGNOSIS — R079 Chest pain, unspecified: Secondary | ICD-10-CM | POA: Diagnosis not present

## 2016-08-16 DIAGNOSIS — K222 Esophageal obstruction: Secondary | ICD-10-CM

## 2016-08-16 DIAGNOSIS — E785 Hyperlipidemia, unspecified: Secondary | ICD-10-CM | POA: Diagnosis not present

## 2016-08-16 DIAGNOSIS — R0609 Other forms of dyspnea: Secondary | ICD-10-CM

## 2016-08-16 LAB — CBC WITH DIFFERENTIAL/PLATELET
BASOS ABS: 0 10*3/uL (ref 0.0–0.1)
Basophils Relative: 1 % (ref 0.0–3.0)
EOS ABS: 0.3 10*3/uL (ref 0.0–0.7)
Eosinophils Relative: 9.1 % — ABNORMAL HIGH (ref 0.0–5.0)
HCT: 42.1 % (ref 39.0–52.0)
HEMOGLOBIN: 14.3 g/dL (ref 13.0–17.0)
Lymphocytes Relative: 37.1 % (ref 12.0–46.0)
Lymphs Abs: 1.3 10*3/uL (ref 0.7–4.0)
MCHC: 33.9 g/dL (ref 30.0–36.0)
MCV: 82.5 fl (ref 78.0–100.0)
MONO ABS: 0.4 10*3/uL (ref 0.1–1.0)
Monocytes Relative: 10.2 % (ref 3.0–12.0)
Neutro Abs: 1.5 10*3/uL (ref 1.4–7.7)
Neutrophils Relative %: 42.6 % — ABNORMAL LOW (ref 43.0–77.0)
Platelets: 211 10*3/uL (ref 150.0–400.0)
RBC: 5.1 Mil/uL (ref 4.22–5.81)
RDW: 13.5 % (ref 11.5–15.5)
WBC: 3.5 10*3/uL — AB (ref 4.0–10.5)

## 2016-08-16 LAB — LIPID PANEL
CHOL/HDL RATIO: 5
Cholesterol: 186 mg/dL (ref 0–200)
HDL: 40.1 mg/dL (ref >39.00)
NONHDL: 146.33
Triglycerides: 245 mg/dL — ABNORMAL HIGH (ref 0.0–149.0)
VLDL: 49 mg/dL — AB (ref 0.0–40.0)

## 2016-08-16 LAB — BASIC METABOLIC PANEL
BUN: 15 mg/dL (ref 6–23)
CHLORIDE: 103 meq/L (ref 96–112)
CO2: 36 meq/L — AB (ref 19–32)
Calcium: 9.1 mg/dL (ref 8.4–10.5)
Creatinine, Ser: 1.31 mg/dL (ref 0.40–1.50)
GFR: 69.46 mL/min (ref >60.00)
GLUCOSE: 72 mg/dL (ref 70–99)
POTASSIUM: 4.3 meq/L (ref 3.5–5.1)
SODIUM: 140 meq/L (ref 135–145)

## 2016-08-16 LAB — VITAMIN B12: Vitamin B-12: 1148 pg/mL — ABNORMAL HIGH (ref 211–911)

## 2016-08-16 LAB — LDL CHOLESTEROL, DIRECT: Direct LDL: 114 mg/dL

## 2016-08-16 LAB — TSH: TSH: 0.79 u[IU]/mL (ref 0.35–4.50)

## 2016-08-16 MED ORDER — SIMVASTATIN 40 MG PO TABS: 40.0000 mg | ORAL_TABLET | Freq: Every day | ORAL | 3 refills | Status: DC

## 2016-08-16 NOTE — Progress Notes (Signed)
Subjective:    Patient ID: Omar Carbon., male    DOB: 12/16/1945, 70 y.o.   MRN: [id number]  HPI Pt states few months of intermittent moderate dyspnea sensation in the chest, and slight assoc pain.   Past Medical History:  Diagnosis Date  . BPH (benign prostatic hypertrophy)   . Decreased white blood cell count   . Depression   . Diverticulosis   . Erectile dysfunction   . Gastric polyps   . GERD (gastroesophageal reflux disease)   . History of colonic polyps   . History of esophageal stricture 10/31/2007  . History of kidney stones   . History of rheumatic fever   . Hyperlipidemia   . Prostate cancer ([place]) 11/09/2012  . Prostatism     Past Surgical History:  Procedure Laterality Date  . CARPAL TUNNEL RELEASE     Bilateral  . ESOPHAGEAL DILATION     last dilation 8'12  . INGUINAL HERNIA REPAIR    . INSERTION OF MESH N/A 04/15/2013   Procedure: INSERTION OF MESH;  Surgeon: Odis Hollingshead, MD;  Location: [place];  Service: General;  Laterality: N/A;  . KNEE ARTHROSCOPY     left  . LEFT HEART CATHETERIZATION WITH CORONARY ANGIOGRAM N/A 07/18/2014   Procedure: LEFT HEART CATHETERIZATION WITH CORONARY ANGIOGRAM;  Surgeon: Burnell Blanks, MD;  Location: [facility] CATH LAB;  Service: Cardiovascular;  Laterality: N/A;  . ROBOT ASSISTED LAPAROSCOPIC RADICAL PROSTATECTOMY  11/08/2012   Procedure: ROBOTIC ASSISTED LAPAROSCOPIC RADICAL PROSTATECTOMY LEVEL 1;  Surgeon: Molli Hazard, MD;  [facility];  Service: Urology;  Laterality: N/A;     . TONSILLECTOMY    . UMBILICAL HERNIA REPAIR N/A 04/15/2013   Procedure: HERNIA REPAIR UMBILICAL ADULT;  Surgeon: Odis Hollingshead, MD;  Location: [place];  Service: General;  Laterality: N/A;    Social History   Social History  . Marital status: Married    Spouse name: N/A  . Number of children: 4  . Years of education: N/A   Occupational History  . pastor [employer]   Social History Main Topics  .  Smoking status: Former Smoker    Packs/day: 1.00    Years: 25.00    Types: Cigarettes    Quit date: 12/05/1993  . Smokeless tobacco: Never Used  . Alcohol use No  . Drug use: No  . Sexual activity: Yes   Other Topics Concern  . Not on file   Social History Narrative  . No narrative on file    Current Outpatient Prescriptions on File Prior to Visit  Medication Sig Dispense Refill  . aspirin 81 MG tablet Take 81 mg by mouth daily.    . Multiple Vitamin (MULTIVITAMIN WITH MINERALS) TABS tablet Take 1 tablet by mouth daily.    . niacin (NIASPAN) 500 MG CR tablet Take 1 tablet (500 mg total) by mouth at bedtime. 30 tablet 11  . pantoprazole (PROTONIX) 40 MG tablet TAKE 1 TABLET BY MOUTH ONCE A DAY. 30 tablet 2  . PRESCRIPTION MEDICATION Inject 0.25-0.4 mLs into the skin daily as needed (erectile dysfunction). Penile injection compounded at  [facility]. Trimixture of Prostaglandin E-1, Papaverine, and Phentolamine.    . [DISCONTINUED] vardenafil (LEVITRA) 20 MG tablet Take 20 mg by mouth as needed.       No current facility-administered medications on file prior to visit.     Allergies  Allergen Reactions  . Sulfonamide Derivatives Other (See Comments)  blisters on skin    Family History  Problem Relation Age of Onset  . Ovarian cancer Mother   . Cancer Mother   . Lung cancer Maternal Uncle   . Lung cancer Maternal Aunt   . Colon cancer Neg Hx   . Esophageal cancer Neg Hx   . Stomach cancer Neg Hx   . Rectal cancer Neg Hx   . Stroke Father     BP (!) 142/94   Pulse 88   Ht 6\' 3"  (1.905 m)   Wt 208 lb (94.3 kg)   SpO2 99%   BMI 26.00 kg/m    Review of Systems Heartburn is worse recently, despite protonix.  Denies LOC.  Denies dysphagia.  He has numbness of both feet    Objective:   Physical Exam VITAL SIGNS:  See vs page.   GENERAL: no distress. LUNGS:  Clear to auscultation.  HEART:  Regular rate and rhythm without murmurs noted. Normal S1,S2.     Pulses: dorsalis pedis intact bilat.   MSK: no deformity of the feet CV: no leg edema Skin:  no ulcer on the feet.  normal color and temp on the feet. Neuro: sensation is intact to touch on the feet Ext: There is bilateral onychomycosis of the toenails     i personally reviewed electrocardiogram tracing (today): Indication: sob Impression: ? 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  Ref pulm GERD: worse Numbness, uncertain etiology.

## 2016-08-16 NOTE — Telephone Encounter (Signed)
A user error has taken place.

## 2016-08-16 NOTE — Patient Instructions (Addendum)
Let's check a treadmill test and a nerve-ending test. Please see a stomach specialist.  you will receive a phone call, about a day and time for an appointment.  blood tests are requested for you today.  We'll let you know about the results. Please come back for a "medicare wellness" appointment in 1 month.

## 2016-08-17 ENCOUNTER — Other Ambulatory Visit: Payer: Self-pay | Admitting: *Deleted

## 2016-08-17 DIAGNOSIS — R200 Anesthesia of skin: Secondary | ICD-10-CM

## 2016-08-18 ENCOUNTER — Ambulatory Visit (INDEPENDENT_AMBULATORY_CARE_PROVIDER_SITE_OTHER): Payer: Medicare Other | Admitting: Neurology

## 2016-08-18 DIAGNOSIS — R200 Anesthesia of skin: Secondary | ICD-10-CM

## 2016-08-18 DIAGNOSIS — G629 Polyneuropathy, unspecified: Secondary | ICD-10-CM

## 2016-08-18 NOTE — Procedures (Signed)
[facility]  [address]  [place], [place] [address] Tel: [phone] Fax:  [phone] Test Date:  08/18/2016  Patient: Omar [name] DOB: 1946/07/12 Physician: Narda Amber, DO  Sex: Male Height: 6\' 3"  Ref Phys: Renato Shin, M.D.  ID#: [id number] Temp: 32.6C Technician: Jerilynn Mages. Dean   Patient Complaints: This is a 70 year old gentleman referred for evaluation of bilateral foot numbness.  NCV & EMG Findings: Extensive electrodiagnostic testing of the right lower extremity and additional studies of the left shows: 1. Bilateral sural and superficial peroneal sensory responses are absent. 2. Bilateral tibial motor responses show reduced amplitude and borderline slowed conduction velocity. Bilateral peroneal (EDB) show borderline slowed conduction velocity, with preserved amplitude. 3. Bilateral tibial H reflex studies are prolonged. 4. Chronic motor axon loss changes are seen affecting the muscles below the knee and conform to a gradient pattern. There is no evidence of accompanied active denervation.  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  Overall, these findings are moderate-to-severe in degree electrically.    ___________________________ Narda Amber, DO    Nerve Conduction Studies Anti Sensory Summary Table   Site NR Peak (ms) Norm Peak (ms) P-T Amp (V) Norm P-T Amp  Left Sup Peroneal Anti Sensory (Ant Lat Mall)  32.6C  12 cm NR  <4.6  >3  Right Sup Peroneal Anti Sensory (Ant Lat Mall)  32.6C  12 cm NR  <4.6  >3  Left Sural Anti Sensory (Lat Mall)  32.6C  Calf NR  <4.6  >3  Right Sural Anti Sensory (Lat Mall)  32.6C  Calf NR  <4.6  >3   Motor Summary Table   Site NR Onset (ms) Norm Onset (ms) O-P Amp (mV) Norm O-P Amp Site1 Site2 Delta-0 (ms) Dist (cm) Vel (m/s) Norm Vel (m/s)  Left Peroneal Motor  (Ext Dig Brev)  32.6C  Ankle    3.9 <6.0 5.8 >2.5 B Fib Ankle 9.8 38.0 39 >40  B Fib    13.7  5.1  Poplt B Fib 2.5 10.0 40 >40  Poplt    16.2  5.1         Right Peroneal Motor (Ext Dig Brev)  32.6C  Ankle    3.8 <6.0 3.9 >2.5 B Fib Ankle 8.2 32.0 39 >40  B Fib    12.0  3.1  Poplt B Fib 2.7 10.0 37 >40  Poplt    14.7  3.0         Right Peroneal TA Motor (Tib Ant)  32.6C  Fib Head    3.3 <4.5 7.6 >3 Poplit Fib Head 1.2 7.0 58 >40  Poplit    4.5  7.6         Left Tibial Motor (Abd Hall Brev)  32.6C  Ankle    5.4 <6.0 1.4 >4 Knee Ankle 11.7 43.0 37 >40  Knee    17.1  0.6         Right Tibial Motor (Abd Hall Brev)  32.6C  Ankle    4.1 <6.0 0.8 >4 Knee Ankle 10.9 41.0 38 >40  Knee    15.0  0.7          H Reflex Studies   NR H-Lat (ms) Lat Norm (ms) L-R H-Lat (ms) M-Lat (ms) HLat-MLat (ms)  Left Tibial (Gastroc)  32.6C     40.14 <35 0.95 5.44 34.70  Right Tibial (Gastroc)  32.6C  41.09 <35 0.95 5.44 35.65   EMG   Side Muscle Ins Act Fibs Psw Fasc Number Recrt Dur Dur. Amp Amp. Poly Poly. Comment  Right AntTibialis Nml Nml Nml Nml 1- Rapid Many 1+ Some 1+ Nml Nml N/A  Right Gastroc Nml Nml Nml Nml 1- Rapid Some 1+ Some 1+ Nml Nml N/A  Right Flex Dig [place] Nml Nml Nml Nml 1- Rapid Many 1+ Many 1+ Nml Nml N/A  Right RectFemoris Nml Nml Nml Nml Nml Nml Nml Nml Nml Nml Nml Nml N/A  Right GluteusMed Nml Nml Nml Nml Nml Nml Nml Nml Nml Nml Nml Nml N/A  Right BicepsFemS Nml Nml Nml Nml Nml Nml Nml Nml Nml Nml Nml Nml N/A  Left AntTibialis Nml Nml Nml Nml 1- Rapid Many 1+ Some 1+ Nml Nml N/A  Left Gastroc Nml Nml Nml Nml 1- Rapid Some 1+ Some 1+ Nml Nml N/A  Left Flex Dig B[place]ch Nml Nml Nml Nml 1- Rapid Many 1+ Many 1+ Nml Nml N/A  Left RectFemoris Nml Nml Nml Nml Nml Nml Nml Nml Nml Nml Nml Nml N/A  Left BicepsFemS Nml Nml Nml Nml Nml Nml Nml Nml Nml Nml Nml Nml N/A      Waveforms:

## 2016-09-01 ENCOUNTER — Telehealth: Payer: Self-pay | Admitting: Endocrinology

## 2016-09-01 ENCOUNTER — Ambulatory Visit: Payer: Medicare Other | Admitting: Gastroenterology

## 2016-09-01 DIAGNOSIS — G609 Hereditary and idiopathic neuropathy, unspecified: Secondary | ICD-10-CM

## 2016-09-01 NOTE — Telephone Encounter (Signed)
Patient ask you to give him a call °

## 2016-09-02 ENCOUNTER — Other Ambulatory Visit: Payer: Self-pay | Admitting: Endocrinology

## 2016-09-05 NOTE — Telephone Encounter (Signed)
Pt is asking waht his follow up is supposed to be after the EMG he had at Dr. Serita Grit office

## 2016-09-05 NOTE — Telephone Encounter (Signed)
See message and please advise, Thanks!  

## 2016-09-05 NOTE — Telephone Encounter (Signed)
I don't know why you would have neuropathy.  In my opinion, the next step would be to see Dr Posey Pronto for an appointment. OK with you?

## 2016-09-06 DIAGNOSIS — G609 Hereditary and idiopathic neuropathy, unspecified: Secondary | ICD-10-CM | POA: Insufficient documentation

## 2016-09-06 NOTE — Telephone Encounter (Signed)
I contacted the patient and advised of message. Patient agreed to seeing Dr. Posey Pronto for an appointment.

## 2016-09-06 NOTE — Telephone Encounter (Signed)
Ok, you will receive a phone call, about a day and time for an appointment 

## 2016-09-13 ENCOUNTER — Encounter: Payer: Self-pay | Admitting: Endocrinology

## 2016-09-13 ENCOUNTER — Ambulatory Visit (INDEPENDENT_AMBULATORY_CARE_PROVIDER_SITE_OTHER): Payer: Medicare Other | Admitting: Endocrinology

## 2016-09-13 VITALS — BP 136/82 | HR 86 | Ht 75.0 in | Wt 206.0 lb

## 2016-09-13 DIAGNOSIS — Z0000 Encounter for general adult medical examination without abnormal findings: Secondary | ICD-10-CM | POA: Diagnosis not present

## 2016-09-13 DIAGNOSIS — R0609 Other forms of dyspnea: Secondary | ICD-10-CM

## 2016-09-13 DIAGNOSIS — I2510 Atherosclerotic heart disease of native coronary artery without angina pectoris: Secondary | ICD-10-CM | POA: Diagnosis not present

## 2016-09-13 DIAGNOSIS — R079 Chest pain, unspecified: Secondary | ICD-10-CM

## 2016-09-13 DIAGNOSIS — Z125 Encounter for screening for malignant neoplasm of prostate: Secondary | ICD-10-CM | POA: Insufficient documentation

## 2016-09-13 LAB — BASIC METABOLIC PANEL
BUN: 14 mg/dL (ref 6–23)
CHLORIDE: 103 meq/L (ref 96–112)
CO2: 31 meq/L (ref 19–32)
CREATININE: 1.19 mg/dL (ref 0.40–1.50)
Calcium: 9.5 mg/dL (ref 8.4–10.5)
GFR: 77.58 mL/min (ref >60.00)
GLUCOSE: 88 mg/dL (ref 70–99)
Potassium: 4.1 mEq/L (ref 3.5–5.1)
SODIUM: 140 meq/L (ref 135–145)

## 2016-09-13 NOTE — Progress Notes (Signed)
we discussed code status.  pt requests full code, but would not want to be started or maintained on artificial life-support measures if there was not a reasonable chance of recovery 

## 2016-09-13 NOTE — Progress Notes (Signed)
Subjective:    Patient ID: Omar [name]., male    DOB: Dec 07, 1945, 70 y.o.   MRN: [id number]  HPI Hypercarbia: sob is much better recently.   GERD: pt says this is well-controlled.   Past Medical History:  Diagnosis Date  . BPH (benign prostatic hypertrophy)   . Decreased white blood cell count   . Depression   . Diverticulosis   . Erectile dysfunction   . Gastric polyps   . GERD (gastroesophageal reflux disease)   . History of colonic polyps   . History of esophageal stricture 10/31/2007  . History of kidney stones   . History of rheumatic fever   . Hyperlipidemia   . Prostate cancer ([place]) 11/09/2012  . Prostatism     Past Surgical History:  Procedure Laterality Date  . CARPAL TUNNEL RELEASE     Bilateral  . ESOPHAGEAL DILATION     last dilation 8'12  . INGUINAL HERNIA REPAIR    . INSERTION OF MESH N/A 04/15/2013   Procedure: INSERTION OF MESH;  Surgeon: Odis Hollingshead, MD;  Location: [place];  Service: General;  Laterality: N/A;  . KNEE ARTHROSCOPY     left  . LEFT HEART CATHETERIZATION WITH CORONARY ANGIOGRAM N/A 07/18/2014   Procedure: LEFT HEART CATHETERIZATION WITH CORONARY ANGIOGRAM;  Surgeon: Burnell Blanks, MD;  Location: [facility] CATH LAB;  Service: Cardiovascular;  Laterality: N/A;  . ROBOT ASSISTED LAPAROSCOPIC RADICAL PROSTATECTOMY  11/08/2012   Procedure: ROBOTIC ASSISTED LAPAROSCOPIC RADICAL PROSTATECTOMY LEVEL 1;  Surgeon: Molli Hazard, MD;  [facility];  Service: Urology;  Laterality: N/A;     . TONSILLECTOMY    . UMBILICAL HERNIA REPAIR N/A 04/15/2013   Procedure: HERNIA REPAIR UMBILICAL ADULT;  Surgeon: Odis Hollingshead, MD;  Location: [place];  Service: General;  Laterality: N/A;    Social History   Social History  . Marital status: Married    Spouse name: N/A  . Number of children: 4  . Years of education: N/A   Occupational History  . pastor [employer]   Social History Main Topics  . Smoking status:  Former Smoker    Packs/day: 1.00    Years: 25.00    Types: Cigarettes    Quit date: 12/05/1993  . Smokeless tobacco: Never Used  . Alcohol use No  . Drug use: No  . Sexual activity: Yes   Other Topics Concern  . Not on file   Social History Narrative  . No narrative on file    Current Outpatient Prescriptions on File Prior to Visit  Medication Sig Dispense Refill  . aspirin 81 MG tablet Take 81 mg by mouth daily.    . Multiple Vitamin (MULTIVITAMIN WITH MINERALS) TABS tablet Take 1 tablet by mouth daily.    . pantoprazole (PROTONIX) 40 MG tablet TAKE 1 TABLET BY MOUTH ONCE A DAY. 30 tablet 0  . PRESCRIPTION MEDICATION Inject 0.25-0.4 mLs into the skin daily as needed (erectile dysfunction). Penile injection compounded at  [facility]. Trimixture of Prostaglandin E-1, Papaverine, and Phentolamine.    . simvastatin (ZOCOR) 40 MG tablet Take 1 tablet (40 mg total) by mouth at bedtime. 90 tablet 3  . [DISCONTINUED] vardenafil (LEVITRA) 20 MG tablet Take 20 mg by mouth as needed.       No current facility-administered medications on file prior to visit.     Allergies  Allergen Reactions  . Sulfonamide Derivatives Other (See Comments)     blisters on  skin    Family History  Problem Relation Age of Onset  . Ovarian cancer Mother   . Cancer Mother   . Lung cancer Maternal Uncle   . Lung cancer Maternal Aunt   . Colon cancer Neg Hx   . Esophageal cancer Neg Hx   . Stomach cancer Neg Hx   . Rectal cancer Neg Hx   . Stroke Father     BP 136/82   Pulse 86   Ht 6\' 3"  (1.905 m)   Wt 206 lb (93.4 kg)   SpO2 97%   BMI 25.75 kg/m   Review of Systems Denies n/v.  Chest pain is better.      Objective:   Physical Exam VITAL SIGNS:  See vs page.  GENERAL: no distress.  LUNGS:  Clear to auscultation.    Lab Results  Component Value Date   CREATININE 1.19 09/13/2016   BUN 14 09/13/2016   NA 140 09/13/2016   K 4.1 09/13/2016   CL 103 09/13/2016   CO2 31  09/13/2016       Assessment & Plan:  GERD: well-controlled, but he is due for GI f/u.  Hypercarbia: better: we'll recheck BMET in the future.    Subjective:   Patient here for Medicare annual wellness visit and management of other chronic and acute problems.     Risk factors: advanced age    74 of Physicians Providing Medical Care to Patient:  See "snapshot"   Activities of Daily Living: In your present state of health, do you have any difficulty performing the following activities (lives with wife)?:  Preparing food and eating?: No  Bathing yourself: No  Getting dressed: No  Using the toilet:No  Moving around from place to place: No  In the past year have you fallen or had a near fall?: No    Home Safety: Has smoke detector and wears seat belts. Firearms are safely stored.   Diet and Exercise  Current exercise habits: pt says good Dietary issues discussed: pt reports a healthy diet   Depression Screen  Q1: Over the past two weeks, have you felt down, depressed or hopeless? no  Q2: Over the past two weeks, have you felt little interest or pleasure in doing things? no   The following portions of the patient's history were reviewed and updated as appropriate: allergies, current medications, past family history, past medical history, past social history, past surgical history and problem list.   Review of Systems  Denies hearing loss, and visual loss Objective:   Vision: sees optometrist, so he declines [facility] today Hearing: grossly normal Body mass index:  See vs page Msk: pt easily and quickly performs "get-up-and-go" from a sitting position.  Cognitive Impairment Assessment: cognition, memory and judgment appear normal.  remembers 2/3 at 5 minutes (? effort).  excellent recall.  can easily read and write a sentence.  alert and oriented x 3.     Assessment:   Medicare wellness utd on preventive parameters    Plan:   During the course of the visit the patient was  educated and counseled about appropriate screening and preventive services including:       Fall prevention   Diabetes screening  Nutrition counseling   Vaccines / LABS Zostavax / Pneumococcal Vaccine today   Patient Instructions (the written plan) was given to the patient.

## 2016-09-13 NOTE — Patient Instructions (Addendum)
Please consider these measures for your health:  minimize alcohol.  Do not use tobacco products.  Have a colonoscopy at least every 10 years from age 70.   Keep firearms safely stored.  Always use seat belts.  have working smoke alarms in your home.  See an eye doctor and dentist regularly.  Never drive under the influence of alcohol or drugs (including prescription drugs).  It is critically important to prevent falling down (keep floor areas well-lit, dry, and free of loose objects.  If you have a cane, walker, or wheelchair, you should use it, even for short trips around the house.  Wear flat-soled shoes.  Also, try not to rush). blood tests are requested for you today.  We'll let you know about the results.  Please come back for a follow-up appointment in 1 year.   

## 2016-09-14 LAB — HEPATITIS C ANTIBODY: HCV Ab: NEGATIVE

## 2016-10-03 ENCOUNTER — Other Ambulatory Visit: Payer: Self-pay | Admitting: Endocrinology

## 2016-10-04 ENCOUNTER — Ambulatory Visit: Payer: Medicare Other | Admitting: Gastroenterology

## 2016-10-31 ENCOUNTER — Other Ambulatory Visit: Payer: Self-pay | Admitting: Endocrinology

## 2016-11-21 ENCOUNTER — Other Ambulatory Visit: Payer: Medicare Other

## 2016-11-21 ENCOUNTER — Other Ambulatory Visit: Payer: Self-pay | Admitting: Neurology

## 2016-11-21 ENCOUNTER — Encounter: Payer: Self-pay | Admitting: Neurology

## 2016-11-21 ENCOUNTER — Ambulatory Visit (INDEPENDENT_AMBULATORY_CARE_PROVIDER_SITE_OTHER): Payer: Medicare Other | Admitting: Neurology

## 2016-11-21 VITALS — BP 130/80 | HR 87 | Ht 75.0 in | Wt 207.2 lb

## 2016-11-21 DIAGNOSIS — I2510 Atherosclerotic heart disease of native coronary artery without angina pectoris: Secondary | ICD-10-CM | POA: Diagnosis not present

## 2016-11-21 DIAGNOSIS — G629 Polyneuropathy, unspecified: Secondary | ICD-10-CM

## 2016-11-21 MED ORDER — GABAPENTIN 300 MG PO CAPS: ORAL_CAPSULE | ORAL | 11 refills | Status: DC

## 2016-11-21 NOTE — Patient Instructions (Addendum)
1.  Check lab testing for causes of neuropathy 2.  Start gabapentin 300mg  at bedtime x 1 week, then increase to 300mg  twice daily  Return to clinic in 6 months

## 2016-11-21 NOTE — Progress Notes (Signed)
[facility] Clinic Note - Initial Visit   Date: 11/21/16  Omar [name]. MRN: [id number] DOB: Dec 04, 1946   Dear Dr. Garnette Scheuermann:  Thank you for your kind referral of Omar [name]. for consultation of neuropathy. Although his history is well known to you, please allow [place] to reiterate it for the purpose of our medical record. The patient was accompanied to the clinic by self.    History of Present Illness: Omar [name]. is a 70 y.o. right-handed African American male with GERD, hyperlipidemia, depression, BPH, and prostate cancer (2013) s/p resection presenting for evaluation of bilateral feet pain.    Starting at least 10 years ago, he began noticing numbness over the toes and over the years, it has progressed to involve the lower half of the foot. Symptoms are constant and sometimes he also has tingling and burning sensation.  He has tried OTC creams without benefit. He denies any weakness or imbalance.  He walks independently and has not suffered any falls.  He has no history diabetes mellitus.  He denies low back pain.  His pain is worse with walking and improved with rest.  No family history of neuropathy.  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   Out-side paper records, electronic medical record, and images have been reviewed where available and summarized as:  NCS/EMG of the legs 08/18/2016:  The electrophysiologic findings are most consistent with a distal and symmetric sensorimotor polyneuropathy, predominantly axon loss in type, affecting the lower extremities.  Overall, these findings are moderate-to-severe in degree electrically.   Lab Results  Component Value Date   TSH 0.79 08/16/2016   Lab Results  Component Value Date   VITAMINB12 1,148 (H) 08/16/2016      Past Medical History:  Diagnosis Date  . BPH (benign prostatic hypertrophy)   .  Decreased white blood cell count   . Depression   . Diverticulosis   . Erectile dysfunction   . Gastric polyps   . GERD (gastroesophageal reflux disease)   . History of colonic polyps   . History of esophageal stricture 10/31/2007  . History of kidney stones   . History of rheumatic fever   . Hyperlipidemia   . Prostate cancer ([place]) 11/09/2012  . Prostatism     Past Surgical History:  Procedure Laterality Date  . CARPAL TUNNEL RELEASE     Bilateral  . ESOPHAGEAL DILATION     last dilation 8'12  . INGUINAL HERNIA REPAIR    . INSERTION OF MESH N/A 04/15/2013   Procedure: INSERTION OF MESH;  Surgeon: Odis Hollingshead, MD;  Location: [facility];  Service: General;  Laterality: N/A;  . KNEE ARTHROSCOPY     left  . LEFT HEART CATHETERIZATION WITH CORONARY ANGIOGRAM N/A 07/18/2014   Procedure: LEFT HEART CATHETERIZATION WITH CORONARY ANGIOGRAM;  Surgeon: Burnell Blanks, MD;  Location: [facility] CATH LAB;  Service: Cardiovascular;  Laterality: N/A;  . ROBOT ASSISTED LAPAROSCOPIC RADICAL PROSTATECTOMY  11/08/2012   Procedure: ROBOTIC ASSISTED LAPAROSCOPIC RADICAL PROSTATECTOMY LEVEL 1;  Surgeon: Molli Hazard, MD;  Location: [facility];  Service: Urology;  Laterality: N/A;     . TONSILLECTOMY    . UMBILICAL HERNIA REPAIR N/A 04/15/2013   Procedure: HERNIA REPAIR UMBILICAL ADULT;  Surgeon: Odis Hollingshead, MD;  Location: [facility];  Service: General;  Laterality: N/A;     Medications:  Outpatient Encounter Prescriptions as of 11/21/2016  Medication Sig  . aspirin 81 MG tablet Take 81 mg by mouth daily.  . Multiple Vitamin (MULTIVITAMIN WITH MINERALS) TABS tablet Take 1 tablet by mouth daily.  . pantoprazole (PROTONIX) 40 MG tablet TAKE 1 TABLET BY MOUTH ONCE A DAY.  Marland Kitchen PRESCRIPTION MEDICATION Inject 0.25-0.4 mLs into the skin daily as needed (erectile dysfunction). Penile injection compounded at  [facility]. Trimixture of Prostaglandin E-1, Papaverine, and Phentolamine.  .  simvastatin (ZOCOR) 40 MG tablet Take 1 tablet (40 mg total) by mouth at bedtime.   No facility-administered encounter medications on file as of 11/21/2016.      Allergies:  Allergies  Allergen Reactions  . Sulfonamide Derivatives Other (See Comments)     blisters on skin    Family History: Family History  Problem Relation Age of Onset  . Ovarian cancer Mother   . Cancer Mother   . Stroke Father   . Lung cancer Maternal Uncle   . Lung cancer Maternal Aunt   . Colon cancer Neg Hx   . Esophageal cancer Neg Hx   . Stomach cancer Neg Hx   . Rectal cancer Neg Hx     Social History: Social History  Substance Use Topics  . Smoking status: Former Smoker    Packs/day: 1.00    Years: 25.00    Types: Cigarettes    Quit date: 12/05/1993  . Smokeless tobacco: Never Used  . Alcohol use No   Social History   Social History Narrative   Lives with wife in a one story home.  Has 4 children.  Works as a Theme park manager.     Education: 10th grade.     Review of Systems:  CONSTITUTIONAL: No fevers, chills, night sweats, or weight loss.   EYES: No visual changes or eye pain ENT: No hearing changes.  No history of nose bleeds.   RESPIRATORY: No cough, wheezing and shortness of breath.   CARDIOVASCULAR: Negative for chest pain, and palpitations.   GI: Negative for abdominal discomfort, blood in stools or black stools.  No recent change in bowel habits.   GU:  No history of incontinence.   MUSCLOSKELETAL: No history of joint pain or swelling.  No myalgias.   SKIN: Negative for lesions, rash, and itching.   HEMATOLOGY/ONCOLOGY: Negative for prolonged bleeding, bruising easily, and swollen nodes.  No history of cancer.   ENDOCRINE: Negative for cold or heat intolerance, polydipsia or goiter.   PSYCH:  No depression or anxiety symptoms.   NEURO: As Above.   Vital Signs:  BP 130/80   Pulse 87   Ht 6\' 3"  (1.905 m)   Wt 207 lb 4 oz (94 kg)   SpO2 93%   BMI 25.90 kg/m    General Medical  Exam:   General:  Well appearing, comfortable.   Eyes/ENT: see cranial nerve examination.   Neck: No masses appreciated.  Full range of motion without tenderness.  No carotid bruits. Respiratory:  Clear to auscultation, good air entry bilaterally.   Cardiac:  Regular rate and rhythm, no murmur.   Extremities:  No deformities, edema, or skin discoloration.  Skin:  No rashes or lesions.  Neurological Exam: MENTAL STATUS including orientation to time, place, person, recent and remote memory, attention span and concentration, language, and fund of knowledge is normal.  Speech is not dysarthric.  CRANIAL NERVES: II:  No visual field defects.  Unremarkable fundi.   III-IV-VI: Pupils equal round and reactive to light.  Normal conjugate, extra-ocular eye movements in all directions of gaze.  No nystagmus.  No ptosis.   V:  Normal facial sensation.    VII:  Normal facial symmetry and movements.  No pathologic facial reflexes.  VIII:  Normal hearing and vestibular function.   IX-X:  Normal palatal movement.   XI:  Normal shoulder shrug and head rotation.   XII:  Normal tongue strength and range of motion, no deviation or fasciculation.  MOTOR:  No atrophy, fasciculations or abnormal movements.  No pronator drift.  Tone is normal.    Right Upper Extremity:    Left Upper Extremity:    Deltoid  5/5   Deltoid  5/5   Biceps  5/5   Biceps  5/5   Triceps  5/5   Triceps  5/5   Wrist extensors  5/5   Wrist extensors  5/5   Wrist flexors  5/5   Wrist flexors  5/5   Finger extensors  5/5   Finger extensors  5/5   Finger flexors  5/5   Finger flexors  5/5   Dorsal interossei  5/5   Dorsal interossei  5/5   Abductor pollicis  5/5   Abductor pollicis  5/5   Tone (Ashworth scale)  0  Tone (Ashworth scale)  0   Right Lower Extremity:    Left Lower Extremity:    Hip flexors  5/5   Hip flexors  5/5   Hip extensors  5/5   Hip extensors  5/5   Knee flexors  5/5   Knee flexors  5/5   Knee extensors  5/5    Knee extensors  5/5   Dorsiflexors  5/5   Dorsiflexors  5/5   Plantarflexors  5/5   Plantarflexors  5/5   Toe extensors  5/5   Toe extensors  5/5   Toe flexors  5/5   Toe flexors  5/5   Tone (Ashworth scale)  0  Tone (Ashworth scale)  0   MSRs:  Right                                                                 Left brachioradialis 2+  brachioradialis 2+  biceps 2+  biceps 2+  triceps 2+  triceps 2+  patellar 3+  patellar 3+  ankle jerk 1+  ankle jerk 1+  Hoffman no  Hoffman no  plantar response down  plantar response down   SENSORY:  Vibration and light touch is diminished distally in the feet.  Pin prick and temperature is intact throughout. Romberg's sign absent.   COORDINATION/GAIT: Normal finger-to- nose-finger and heel-to-shin.  Intact rapid alternating movements bilaterally.  Able to rise from a chair without using arms.  Gait narrow based and stable. Mild unsteadiness with tandem and stressed gait.    IMPRESSION: Omar [name] is a 70 year-old gentleman referred for evaluation of bilateral feet paresthesias.  His NCS/EMG shows sensorimotor polyneuropathy which is consistent with his history and exam. 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  I will check laboratory testing for treatable causes of neuropathy.  However, in the vast majority of cases neuropathy  tends to be idiopathic and we are left managing symptoms.  With his brisk patella jerks and preserved Achilles reflex, he may also have some degree of underlying lumbar canal stenosis, but he currently denies any low back pain or radicular symptoms so will follow this clinically.    PLAN/RECOMMENDATIONS:  1.  Check glucose tolerance testing, copper, SPEP with IFE, vitamin B1 2.  Start gabapentin 300mg  at bedtime x 1 week, then increase to 300mg  twice daily  Return to clinic in 6 months.   The  duration of this appointment visit was 45 minutes of face-to-face time with the patient.  Greater than 50% of this time was spent in counseling, explanation of diagnosis, planning of further management, and coordination of care.   Thank you for allowing me to participate in patient's care.  If I can answer any additional questions, I would be pleased to do so.    Sincerely,    Donika K. Posey Pronto, DO

## 2016-11-23 LAB — PROTEIN ELECTROPHORESIS, SERUM
ALBUMIN ELP: 4.1 g/dL (ref 3.8–4.8)
ALPHA-1-GLOBULIN: 0.3 g/dL (ref 0.2–0.3)
Alpha-2-Globulin: 0.7 g/dL (ref 0.5–0.9)
BETA 2: 0.3 g/dL (ref 0.2–0.5)
BETA GLOBULIN: 0.4 g/dL (ref 0.4–0.6)
Gamma Globulin: 0.7 g/dL — ABNORMAL LOW (ref 0.8–1.7)
Total Protein, Serum Electrophoresis: 6.5 g/dL (ref 6.1–8.1)

## 2016-11-23 LAB — IMMUNOFIXATION ELECTROPHORESIS
IgA: 118 mg/dL (ref 81–463)
IgG (Immunoglobin G), Serum: 778 mg/dL (ref 694–1618)
IgM, Serum: 39 mg/dL — ABNORMAL LOW (ref 48–271)

## 2016-11-24 ENCOUNTER — Other Ambulatory Visit: Payer: Medicare Other

## 2016-11-24 DIAGNOSIS — C61 Malignant neoplasm of prostate: Secondary | ICD-10-CM | POA: Diagnosis not present

## 2016-11-24 DIAGNOSIS — G629 Polyneuropathy, unspecified: Secondary | ICD-10-CM

## 2016-11-24 LAB — GLUCOSE TOLERANCE, 2 HOURS
GLUCOSE 1 HOUR GTT: 135 mg/dL
GLUCOSE, FASTING: 106 mg/dL — AB (ref 70–99)
Glucose, 2 hour: 101 mg/dL

## 2016-11-25 LAB — COPPER, SERUM: COPPER: 97 ug/dL (ref 72–166)

## 2016-11-26 LAB — VITAMIN B1: Vitamin B1 (Thiamine): 14 nmol/L (ref 8–30)

## 2016-12-01 ENCOUNTER — Telehealth: Payer: Self-pay | Admitting: Endocrinology

## 2016-12-01 DIAGNOSIS — C61 Malignant neoplasm of prostate: Secondary | ICD-10-CM | POA: Diagnosis not present

## 2016-12-01 DIAGNOSIS — N5201 Erectile dysfunction due to arterial insufficiency: Secondary | ICD-10-CM | POA: Diagnosis not present

## 2016-12-01 DIAGNOSIS — N393 Stress incontinence (female) (male): Secondary | ICD-10-CM | POA: Diagnosis not present

## 2016-12-01 NOTE — Telephone Encounter (Signed)
Patient has changed his pharmacy to   [address] [place], [place] [address] Phone: [phone]

## 2016-12-01 NOTE — Telephone Encounter (Signed)
Pharmacy updated.

## 2016-12-13 ENCOUNTER — Telehealth: Payer: Self-pay | Admitting: Endocrinology

## 2016-12-13 MED ORDER — PANTOPRAZOLE SODIUM 40 MG PO TBEC: 40.0000 mg | DELAYED_RELEASE_TABLET | Freq: Every day | ORAL | 2 refills | Status: DC

## 2016-12-13 NOTE — Telephone Encounter (Signed)
Refill submitted per patient's request.  

## 2016-12-13 NOTE — Telephone Encounter (Signed)
Patient needs pantoprazole (PROTONIX) 40 MG tablet  Sent to: [id number] - [place], [place]

## 2017-01-06 ENCOUNTER — Inpatient Hospital Stay (HOSPITAL_COMMUNITY)
Admission: RE | Admit: 2017-01-06 | Discharge: 2017-01-06 | Disposition: A | Discharge status: Home / Self Care | Payer: Medicare Other | Source: Ambulatory Visit

## 2017-01-06 ENCOUNTER — Other Ambulatory Visit (HOSPITAL_COMMUNITY)
Admission: RE | Admit: 2017-01-06 | Discharge: 2017-01-06 | Disposition: A | Discharge status: Home / Self Care | Payer: Medicare Other | Source: Ambulatory Visit | Attending: Endocrinology | Admitting: Endocrinology

## 2017-01-06 ENCOUNTER — Ambulatory Visit (INDEPENDENT_AMBULATORY_CARE_PROVIDER_SITE_OTHER): Payer: Medicare Other | Admitting: Endocrinology

## 2017-01-06 ENCOUNTER — Encounter: Payer: Self-pay | Admitting: Endocrinology

## 2017-01-06 VITALS — BP 122/80 | HR 84 | Ht 75.0 in | Wt 207.0 lb

## 2017-01-06 DIAGNOSIS — R0609 Other forms of dyspnea: Secondary | ICD-10-CM | POA: Diagnosis not present

## 2017-01-06 NOTE — Patient Instructions (Addendum)
Please see a lung specialist.  you will receive a phone call, about a day and time for an appointment.   Please do a special blood test at the hospital.  here is a paper to bring.

## 2017-01-06 NOTE — Progress Notes (Signed)
Subjective:    Patient ID: Omar [name]., male    DOB: 20-Apr-1946, 71 y.o.   MRN: [id number]  HPI Pt states 1 year of slight dyspnea sensation in the chest, and assoc nausea.   Past Medical History:  Diagnosis Date  . BPH (benign prostatic hypertrophy)   . Decreased white blood cell count   . Depression   . Diverticulosis   . Erectile dysfunction   . Gastric polyps   . GERD (gastroesophageal reflux disease)   . History of colonic polyps   . History of esophageal stricture 10/31/2007  . History of kidney stones   . History of rheumatic fever   . Hyperlipidemia   . Prostate cancer ([place]) 11/09/2012  . Prostatism     Past Surgical History:  Procedure Laterality Date  . CARPAL TUNNEL RELEASE     Bilateral  . ESOPHAGEAL DILATION     last dilation 8'12  . INGUINAL HERNIA REPAIR    . INSERTION OF MESH N/A 04/15/2013   Procedure: INSERTION OF MESH;  Surgeon: Odis Hollingshead, MD;  Location: [facility];  Service: General;  Laterality: N/A;  . KNEE ARTHROSCOPY     left  . LEFT HEART CATHETERIZATION WITH CORONARY ANGIOGRAM N/A 07/18/2014   Procedure: LEFT HEART CATHETERIZATION WITH CORONARY ANGIOGRAM;  Surgeon: Burnell Blanks, MD;  Location: [facility] CATH LAB;  Service: Cardiovascular;  Laterality: N/A;  . ROBOT ASSISTED LAPAROSCOPIC RADICAL PROSTATECTOMY  11/08/2012   Procedure: ROBOTIC ASSISTED LAPAROSCOPIC RADICAL PROSTATECTOMY LEVEL 1;  Surgeon: Molli Hazard, MD;  Location: [facility];  Service: Urology;  Laterality: N/A;     . TONSILLECTOMY    . UMBILICAL HERNIA REPAIR N/A 04/15/2013   Procedure: HERNIA REPAIR UMBILICAL ADULT;  Surgeon: Odis Hollingshead, MD;  Location: [facility];  Service: General;  Laterality: N/A;    Social History   Social History  . Marital status: Married    Spouse name: N/A  . Number of children: 4  . Years of education: N/A   Occupational History  . pastor [employer]   Social History Main Topics  . Smoking status: Former  Smoker    Packs/day: 1.00    Years: 25.00    Types: Cigarettes    Quit date: 12/05/1993  . Smokeless tobacco: Never Used  . Alcohol use No  . Drug use: No  . Sexual activity: Yes   Other Topics Concern  . Not on file   Social History Narrative   Lives with wife in a one story home.  Has 4 children.  Works as a Theme park manager.     Education: 10th grade.     Current Outpatient Prescriptions on File Prior to Visit  Medication Sig Dispense Refill  . aspirin 81 MG tablet Take 81 mg by mouth daily.    Marland Kitchen gabapentin (NEURONTIN) 300 MG capsule Take 1 tablet at bedtime for one week, then increase to 1 tablet in the morning and bedtime. 60 capsule 11  . Multiple Vitamin (MULTIVITAMIN WITH MINERALS) TABS tablet Take 1 tablet by mouth daily.    . pantoprazole (PROTONIX) 40 MG tablet Take 1 tablet (40 mg total) by mouth daily. 30 tablet 2  . PRESCRIPTION MEDICATION Inject 0.25-0.4 mLs into the skin daily as needed (erectile dysfunction). Penile injection compounded at  [facility]. Trimixture of Prostaglandin E-1, Papaverine, and Phentolamine.    . simvastatin (ZOCOR) 40 MG tablet Take 1 tablet (40 mg total) by mouth at bedtime. 90 tablet 3  . [  DISCONTINUED] vardenafil (LEVITRA) 20 MG tablet Take 20 mg by mouth as needed.       No current facility-administered medications on file prior to visit.     Allergies  Allergen Reactions  . Sulfonamide Derivatives Other (See Comments)     blisters on skin    Family History  Problem Relation Age of Onset  . Ovarian cancer Mother   . Cancer Mother   . Stroke Father   . Lung cancer Maternal Uncle   . Lung cancer Maternal Aunt   . Colon cancer Neg Hx   . Esophageal cancer Neg Hx   . Stomach cancer Neg Hx   . Rectal cancer Neg Hx    BP 122/80   Pulse 84   Ht 6\' 3"  (1.905 m)   Wt 207 lb (93.9 kg)   SpO2 98%   BMI 25.87 kg/m   Review of Systems No weight change.  Denies cough.    Objective:   Physical Exam VITAL SIGNS:  See vs  page GENERAL: no distress.  LUNGS:  Clear to auscultation.    Lab Results  Component Value Date   WBC 3.5 (L) 08/16/2016   HGB 14.3 08/16/2016   HCT 42.1 08/16/2016   MCV 82.5 08/16/2016   PLT 211.0 08/16/2016   2017: CXR: normal.   Spirometry: normal.       Assessment & Plan:  Dyspnea, persistent.  Check ABG.   Patient is advised the following: Patient Instructions  Please see a lung specialist.  you will receive a phone call, about a day and time for an appointment.   Please do a special blood test at the hospital.  here is a paper to bring.

## 2017-01-06 NOTE — Progress Notes (Signed)
   Subjective:    Patient ID: Omar [name]., male    DOB: March 25, 1946, 71 y.o.   MRN: [id number]  HPI    Review of Systems     Objective:   Physical Exam        Assessment & Plan:

## 2017-01-09 NOTE — Telephone Encounter (Signed)
Patient is leaving blood gas test results   ph 7.439, pco 94,  Bicard 26.7, and saturation is97.1 and question peasel call.

## 2017-01-09 NOTE — Telephone Encounter (Signed)
I contacted the patient and advised of message. Patient voiced understanding and had no further questions at this time.  

## 2017-01-09 NOTE — Telephone Encounter (Signed)
This is normal--good.  Next step it to see the specialist.  you will receive a phone call, about a day and time for an appointment

## 2017-01-09 NOTE — Telephone Encounter (Signed)
See message and please advise, Thanks!  

## 2017-01-10 ENCOUNTER — Emergency Department (HOSPITAL_COMMUNITY)
Admission: EM | Admit: 2017-01-10 | Discharge: 2017-01-11 | Disposition: A | Discharge status: Home / Self Care | Payer: Medicare Other | Attending: Emergency Medicine | Admitting: Emergency Medicine

## 2017-01-10 ENCOUNTER — Encounter (HOSPITAL_COMMUNITY): Payer: Self-pay | Admitting: Emergency Medicine

## 2017-01-10 ENCOUNTER — Emergency Department (HOSPITAL_COMMUNITY): Payer: Medicare Other

## 2017-01-10 DIAGNOSIS — R0602 Shortness of breath: Secondary | ICD-10-CM | POA: Insufficient documentation

## 2017-01-10 DIAGNOSIS — Z7982 Long term (current) use of aspirin: Secondary | ICD-10-CM | POA: Insufficient documentation

## 2017-01-10 DIAGNOSIS — Z8546 Personal history of malignant neoplasm of prostate: Secondary | ICD-10-CM | POA: Diagnosis not present

## 2017-01-10 DIAGNOSIS — Z87891 Personal history of nicotine dependence: Secondary | ICD-10-CM | POA: Diagnosis not present

## 2017-01-10 DIAGNOSIS — R110 Nausea: Secondary | ICD-10-CM | POA: Diagnosis not present

## 2017-01-10 DIAGNOSIS — Z79899 Other long term (current) drug therapy: Secondary | ICD-10-CM | POA: Insufficient documentation

## 2017-01-10 DIAGNOSIS — R42 Dizziness and giddiness: Secondary | ICD-10-CM | POA: Insufficient documentation

## 2017-01-10 LAB — BASIC METABOLIC PANEL
Anion gap: 9 (ref 5–15)
BUN: 11 mg/dL (ref 6–20)
CHLORIDE: 102 mmol/L (ref 101–111)
CO2: 28 mmol/L (ref 22–32)
CREATININE: 1.29 mg/dL — AB (ref 0.61–1.24)
Calcium: 9.4 mg/dL (ref 8.9–10.3)
GFR calc Af Amer: >60 mL/min (ref >60)
GFR calc non Af Amer: 55 mL/min — ABNORMAL LOW (ref >60)
GLUCOSE: 92 mg/dL (ref 65–99)
Potassium: 4.1 mmol/L (ref 3.5–5.1)
Sodium: 139 mmol/L (ref 135–145)

## 2017-01-10 LAB — I-STAT TROPONIN, ED: Troponin i, poc: 0 ng/mL (ref 0.00–0.08)

## 2017-01-10 LAB — BRAIN NATRIURETIC PEPTIDE: B Natriuretic Peptide: 28 pg/mL (ref 0.0–100.0)

## 2017-01-10 LAB — CBC
HCT: 41.9 % (ref 39.0–52.0)
Hemoglobin: 13.7 g/dL (ref 13.0–17.0)
MCH: 26.9 pg (ref 26.0–34.0)
MCHC: 32.7 g/dL (ref 30.0–36.0)
MCV: 82.2 fL (ref 78.0–100.0)
PLATELETS: 183 10*3/uL (ref 150–400)
RBC: 5.1 MIL/uL (ref 4.22–5.81)
RDW: 12.7 % (ref 11.5–15.5)
WBC: 3.4 10*3/uL — ABNORMAL LOW (ref 4.0–10.5)

## 2017-01-10 NOTE — ED Triage Notes (Signed)
Pt reports sob for the past few months. Denies chest pain. Pt a/ox4, resp e/u,  Nad.

## 2017-01-11 ENCOUNTER — Emergency Department (HOSPITAL_COMMUNITY): Payer: Medicare Other

## 2017-01-11 DIAGNOSIS — R0602 Shortness of breath: Secondary | ICD-10-CM | POA: Diagnosis not present

## 2017-01-11 LAB — URINALYSIS, ROUTINE W REFLEX MICROSCOPIC
Bilirubin Urine: NEGATIVE
Glucose, UA: NEGATIVE mg/dL
HGB URINE DIPSTICK: NEGATIVE
Ketones, ur: NEGATIVE mg/dL
Leukocytes, UA: NEGATIVE
NITRITE: NEGATIVE
Protein, ur: NEGATIVE mg/dL
SPECIFIC GRAVITY, URINE: 1.027 (ref 1.005–1.030)
pH: 6 (ref 5.0–8.0)

## 2017-01-11 LAB — I-STAT TROPONIN, ED: Troponin i, poc: 0 ng/mL (ref 0.00–0.08)

## 2017-01-11 MED ORDER — IOPAMIDOL (ISOVUE-370) INJECTION 76%
INTRAVENOUS | Status: AC
  Administered 2017-01-11: 100 mL via INTRAVENOUS
  Filled 2017-01-11: qty 100

## 2017-01-11 MED ORDER — ONDANSETRON HCL 4 MG/2ML IJ SOLN
4.0000 mg | Freq: Once | INTRAMUSCULAR | Status: AC
  Administered 2017-01-11: 4 mg via INTRAVENOUS
  Filled 2017-01-11: qty 2

## 2017-01-11 MED ORDER — ONDANSETRON 4 MG PO TBDP: 4.0000 mg | ORAL_TABLET | Freq: Three times a day (TID) | ORAL | 0 refills | Status: DC | PRN

## 2017-01-11 MED ORDER — SODIUM CHLORIDE 0.9 % IV BOLUS (SEPSIS)
1000.0000 mL | Freq: Once | INTRAVENOUS | Status: AC
  Administered 2017-01-11: 1000 mL via INTRAVENOUS

## 2017-01-11 NOTE — ED Notes (Signed)
Ambulated with patient in the hallway.  Patients oxygen saturation was at 98-100% the entire time.  Patient stated he had no dizziness during ambulation.

## 2017-01-11 NOTE — ED Notes (Signed)
Patient transported to CT 

## 2017-01-11 NOTE — ED Notes (Signed)
Pt returned from CT °

## 2017-01-11 NOTE — Discharge Instructions (Signed)
Your workup today was normal including 2 negative sets of cardiac labs, clear chest x-ray and normal CT scan that showed no sign of blood clots. Your blood counts were normal. Electrolytes reassuring and your urine showed no sign of infection or dehydration. We recommend close follow-up with your primary care physician to monitor your blood pressure. We recommend close follow-up with pulmonology as scheduled for your shortness of breath. I also recommend follow-up with cardiology for further evaluation. If you ever develop chest pain, worsening or persistent shortness of breath, feel like you may pass out or do pass out, sudden sweating, please return to the emergency department.

## 2017-01-11 NOTE — ED Notes (Signed)
Pt provided urinal and asked for sample to collect UA. Pt reports he will attempt to void.

## 2017-01-11 NOTE — ED Provider Notes (Signed)
By signing my name below, I, Avnee Patel, attest that this documentation has been prepared under the direction and in the presence of [place], DO  Electronically Signed: [place], ED Scribe. 01/11/17. 12:30 AM.  TIME SEEN: 12:20 AM  CHIEF COMPLAINT:  Chief Complaint  Patient presents with  . Shortness of Breath    HPI:   Omar [name]. is a 71 y.o. male, with a PMHx of prostate cancer and PE No longer on anticoagulation, who presents to the Emergency Department complaining of increasingly frequent, intermittent episodes of SOB onset 3-4 months. Pt also reports intermittent lightheadedness, night sweats and nausea. No alleviating factors noted. Pt denies fevers, cough, abdominal pain, vomiting, diarrhea, chest pain, unexpected weight loss, change in appetite, pain or swelling to bilateral lower extremities, current blood thinner use or any other associated symptoms. Pt has seen his PCP on 01/06/17 and was referred to a pulmonologist.   PCP: Renato Shin, MD  ROS: See HPI Constitutional: no fever  Eyes: no drainage  ENT: no runny nose   Cardiovascular:  no chest pain  Resp: +SOB  GI: no vomiting GU: no dysuria Integumentary: no rash  Allergy: no hives  Musculoskeletal: no leg swelling  Neurological: no slurred speech ROS otherwise negative  PAST MEDICAL HISTORY/PAST SURGICAL HISTORY:  Past Medical History:  Diagnosis Date  . BPH (benign prostatic hypertrophy)   . Decreased white blood cell count   . Depression   . Diverticulosis   . Erectile dysfunction   . Gastric polyps   . GERD (gastroesophageal reflux disease)   . History of colonic polyps   . History of esophageal stricture 10/31/2007  . History of kidney stones   . History of rheumatic fever   . Hyperlipidemia   . Prostate cancer ([place]) 11/09/2012  . Prostatism     MEDICATIONS:  Prior to Admission medications   Medication Sig Start Date End Date Taking? Authorizing Provider  aspirin 81 MG tablet  Take 81 mg by mouth daily.    Historical Provider, MD  gabapentin (NEURONTIN) 300 MG capsule Take 1 tablet at bedtime for one week, then increase to 1 tablet in the morning and bedtime. 11/21/16   Alda Berthold, DO  Multiple Vitamin (MULTIVITAMIN WITH MINERALS) TABS tablet Take 1 tablet by mouth daily.    Historical Provider, MD  pantoprazole (PROTONIX) 40 MG tablet Take 1 tablet (40 mg total) by mouth daily. 12/13/16   Renato Shin, MD  PRESCRIPTION MEDICATION Inject 0.25-0.4 mLs into the skin daily as needed (erectile dysfunction). Penile injection compounded at  [facility]. Trimixture of Prostaglandin E-1, Papaverine, and Phentolamine.    Historical Provider, MD  simvastatin (ZOCOR) 40 MG tablet Take 1 tablet (40 mg total) by mouth at bedtime. 08/16/16   Renato Shin, MD    ALLERGIES:  Allergies  Allergen Reactions  . Sulfonamide Derivatives Other (See Comments)     blisters on skin    SOCIAL HISTORY:  Social History  Substance Use Topics  . Smoking status: Former Smoker    Packs/day: 1.00    Years: 25.00    Types: Cigarettes    Quit date: 12/05/1993  . Smokeless tobacco: Never Used  . Alcohol use No    FAMILY HISTORY: Family History  Problem Relation Age of Onset  . Ovarian cancer Mother   . Cancer Mother   . Stroke Father   . Lung cancer Maternal Uncle   . Lung cancer Maternal Aunt   . Colon cancer Neg  Hx   . Esophageal cancer Neg Hx   . Stomach cancer Neg Hx   . Rectal cancer Neg Hx     EXAM: BP 151/93   Pulse 65   Temp 98 F (36.7 C)   Resp 16   SpO2 98%  CONSTITUTIONAL: Alert and oriented and responds appropriately to questions. Well-appearing; well-nourished, elderly  HEAD: Normocephalic EYES: Conjunctivae clear, PERRL, EOMI ENT: normal nose; no rhinorrhea; moist mucous membranes NECK: Supple, no meningismus, no nuchal rigidity, no LAD  CARD: RRR; S1 and S2 appreciated; no murmurs, no clicks, no rubs, no gallops RESP: Normal chest excursion without  splinting or tachypnea; breath sounds clear and equal bilaterally; no wheezes, no rhonchi, no rales, no hypoxia or respiratory distress, speaking full sentences ABD/GI: Normal bowel sounds; non-distended; soft, non-tender, no rebound, no guarding, no peritoneal signs, no hepatosplenomegaly BACK:  The back appears normal and is non-tender to palpation, there is no CVA tenderness EXT: Normal ROM in all joints; non-tender to palpation; no edema; normal capillary refill; no cyanosis, no calf tenderness or swelling    SKIN: Normal color for age and race; warm; no rash NEURO: Moves all extremities equally, sensation to light touch intact diffusely, cranial nerves II through XII intact, normal speech PSYCH: The patient's mood and manner are appropriate. Grooming and personal hygiene are appropriate.  MEDICAL DECISION MAKING: Patient here with shortness of breath that has been intermittent for the past 4 months. He reports it lasts for several seconds to several minutes at a time like he has to "catch my breath". States the frequency is increasing which is what made him concerned. He has had lightheadedness and nausea associated with this as well. His exam is unremarkable. No hypoxia, respiratory distress, increased work of breathing and lungs are clear to auscultation. Labs ordered in triage are unremarkable including negative troponin, normal BNP and a clear chest x-ray. EKG shows no new ischemic abnormality. Does have a history of pulmonary embolus and is no longer on anticoagulation. We'll obtain a CT of his chest for further evaluation. We'll check orthostatic vital signs given he is dizzy. We'll give IV fluids, Zofran to help with symptomatic relief. Will repeat second troponin. He denies any chest pain or chest discomfort.  ED PROGRESS: 2:30 AM Patient's second troponin is negative. He is not orthostatic. Urine shows no sign of infection, no ketones. Blood pressure has been mildly elevated. He is not on  blood pressure medication. Will have him follow-up with his PCP Dr. Loanne Drilling for this. CT of his chest shows no pulmonary embolus, edema, consolidation. We will ambulate patient with pulse oximetry.   3:05 AM  Pt able to ambulate without any hypoxia or shortness of breath, dizziness with walking. He is asymptomatic currently. He has follow-up with pulmonology scheduled and I have also recommended follow-up with cardiology. He denies that this is dyspnea with exertion. He does not seem to be any aggravating or relieving factors to his shortness of breath. I feel he is safe for discharge home given this has been ongoing for 4 months. Patient and wife are comfortable with this plan. We'll discharge with Zofran for his intermittent nausea. Have discussed at length return precautions. Recommended close follow-up with his PCP. We did discuss that his blood pressure has been mildly elevated and recommended close monitoring by his PCP for this. I do not feel he should be started on medications in the emergency department at this time. He is comfortable with this plan as well.  At this time, I do not feel there is any life-threatening condition present. I have reviewed and discussed all results (EKG, imaging, lab, urine as appropriate) and exam findings with patient/family. I have reviewed nursing notes and appropriate previous records.  I feel the patient is safe to be discharged home without further emergent workup and can continue workup as an outpatient as needed. Discussed usual and customary return precautions. Patient/family verbalize understanding and are comfortable with this plan.  Outpatient follow-up has been provided. All questions have been answered.    EKG Interpretation  Date/Time:  Tuesday January 10 2017 18:23:39 EST Ventricular Rate:  70 PR Interval:  176 QRS Duration: 78 QT Interval:  386 QTC Calculation: 416 R Axis:   4 Text Interpretation:  Normal sinus rhythm Nonspecific ST  abnormality Abnormal ECG No significant change since last tracing Artifact Confirmed by WARD,  DO, KRISTEN [phone]) on 01/10/2017 11:54:01 PM        I personally performed the services described in this documentation, which was scribed in my presence. The recorded information has been reviewed and is accurate.     [place], DO 01/11/17 [phone]

## 2017-01-11 NOTE — ED Notes (Signed)
ED Provider at bedside. 

## 2017-01-12 ENCOUNTER — Telehealth: Payer: Self-pay | Admitting: Endocrinology

## 2017-01-12 DIAGNOSIS — R0609 Other forms of dyspnea: Principal | ICD-10-CM

## 2017-01-12 NOTE — Telephone Encounter (Signed)
Pt called in to check on the Lung Specialist that Dr. Loanne Drilling was going to refer him to, also he said that he will need a recommendation of a Heart Doctor as well.  He would like a call back.

## 2017-01-13 ENCOUNTER — Telehealth: Payer: Self-pay

## 2017-01-13 NOTE — Telephone Encounter (Signed)
Neoma Laming, Dr. Loanne Drilling placed a referral to Pulmonologist on 01/06/2017. When you can please look at this referral. Thanks!

## 2017-01-13 NOTE — Telephone Encounter (Signed)
done

## 2017-01-13 NOTE — Telephone Encounter (Signed)
I contacted the patient. He stated he has not heard on the pulmonology referral yet. I have sent a message to [facility] requesting them to review the referral. Patient also stated in his recent ER visit it was reccommended for him to be set up with a cardiologist. Patient wanted to know if this referral could be placed as well?

## 2017-01-13 NOTE — Telephone Encounter (Signed)
I contacted the patient and advised of message.

## 2017-01-19 ENCOUNTER — Encounter: Payer: Self-pay | Admitting: Internal Medicine

## 2017-01-19 ENCOUNTER — Ambulatory Visit (INDEPENDENT_AMBULATORY_CARE_PROVIDER_SITE_OTHER): Payer: Medicare Other | Admitting: Internal Medicine

## 2017-01-19 VITALS — BP 124/84 | HR 70 | Ht 75.0 in | Wt 207.6 lb

## 2017-01-19 DIAGNOSIS — R0609 Other forms of dyspnea: Secondary | ICD-10-CM

## 2017-01-19 NOTE — Patient Instructions (Signed)
Pantoprazole (protonix) 40 mg   Take  30-60 min before first meal of the day   GERD (REFLUX)  is an extremely common cause of respiratory symptoms just like yours , many times with no obvious heartburn at all.    It can be treated with medication, but also with lifestyle changes including elevation of the head of your bed (ideally with 6 inch  bed blocks),  Smoking cessation, avoidance of late meals, excessive alcohol, and avoid fatty foods, chocolate, peppermint, colas, red wine, and acidic juices such as orange juice.  NO MINT OR MENTHOL PRODUCTS SO NO COUGH DROPS  USE SUGARLESS CANDY INSTEAD (Jolley ranchers or Stover's or Life Savers) or even ice chips will also do - the key is to swallow to prevent all throat clearing. NO OIL BASED VITAMINS - use powdered substitutes.  Keep your cardiac appointments, return here as needed

## 2017-01-19 NOTE — Progress Notes (Signed)
Subjective:    Patient ID: Omar [name], Omar [name], male    DOB: June 18, 1946 MRN: [id number]    Brief patient profile:  35 ybm quit smoking  Around 1995 with dx of PE 11/13/13   Admit date: 11/13/2013  Discharge date: 11/14/2013  Discharge Diagnoses:  DEPRESSION  Prostate cancer  Right-sided chest wall pain  Pulmonary embolus  Pulmonary embolism  Hyperlipidemia   Pulmonary embolus  -The patient's prostate cancer is his major risk factor  -Given the suggestion of pulmonary infarction as well as right heart strain, the patient was started on intravenous heparin  -After discussion, the patient preferred to take rivaroxaban. Heparin drip was ultimately stopped and pt started on Xarelto  Prostate cancer  -In remission/stable  -Patient follows Omar [name]  Hyperlipidemia  -Continue Zocor  Chest discomfort  -Likely due to the patient's pulmonary embolus  -EKG negative for ST-T wave changes  -cycling Troponins--neg x 3  Discharge Condition: Stable  Disposition: discharge home  Diet: Heart healthy  Wt Readings from Last 3 Encounters:   11/13/13  89.313 kg (196 lb 14.4 oz)   11/12/13  89.812 kg (198 lb)   11/07/13  92.08 kg (203 lb)   History of present illness:  71 year old male with a history of hyperlipidemia, prostate cancer, GERD presents with one-week history of chest discomfort and intermittent shortness of breath. The patient states that on 11/06/2013 he had right-sided chest discomfort that was pleuritic in nature. He came to the emergency department at that time. Chest x-ray showed bibasilar atelectasis/opacities. The patient was sent home with azithromycin with which he was compliant. Unfortunately, the patient continued to have intermittent chest discomfort and dyspnea on exertion. He went to see his urologist on 11/11/2013. He was deemed to be medically stable from his prostate cancer standpoint. He went to see his primary care provider on 11/12/2013. A CT angiogram of the  chest was scheduled for 11/13/2013. His primary care provider called him with results suggestive of pulmonary embolus, and the patient was advised to come to the emergency department. There is no family history of thromboembolism, recent Omar [name] travels, surgeries, or trauma. The patient had some subjective chills over the weekend prior to admission. He denies any headache, visual disturbance, dizziness, syncope, nausea, vomiting, diarrhea, abdominal pain, dysuria, hematuria. There is no hemoptysis, hematemesis, hematochezia, hematuria.  In the emergency department, the patient was dynamically stable without any tachycardia. He was 100% oxygen saturation on room air. BMP showed serum creatinine 1.24. CBC was unremarkable. INR 0.95. D-dimer was 0.89. CT angiogram the chest showed bilateral pulmonary emboli in the left lower lobe and right upper lobe. There was a suggestion of possible right heart strain as well as a wedge-shaped consolidation suggestive of a pulmonary infarction.  The patient was started on a heparin drip to 2 concerns of pulmonary infarction and right ventricular strain. Echocardiogram was performed and showed normal right ventricular function without any collapse. Left ventricular ejection fraction 65-70% with grade 1 diastolic dysfunction. There was no wall motion abnormalities. Troponins were negative x3. EKG shows sinus rhythm without any ST-T wave changes. The patient remained hemodynamically stable and afebrile. He was not tachycardic. He was not hypoxemic. The patient was started back on his home medications. His heparin drip was discontinued. The patient was started on rivaroxaban. Risks, benefits, and alternatives of anticoagulation were discussed with the patient. He expressed understanding and agreed to follow treatment protocols. He preferred to take rivaroxaban over vitamin K antagonist. The patient was started on  rivaroxaban 15 mg twice a day for 21 days. He was then started on 20 mg  daily. The patient will need followup with his primary care provider for further monitoring.  06/04/2014 1st [facility] office visit/ Omar [name]  Chief Complaint  Patient presents with  . Pulmonary Consult    Referred per Dr. Renato [name] for eval of PE.  Pt dxed with PE 11/13/13.    he reports his last check up with urology showed no prostate ca at all. He has h/o [place] car rides but none immediately before his PE. Presently Not limited by breathing from desired activities   rec Please see patient coordinator before you leave today  to schedule venous dopplers> neg except for saphenous thrombosis So rec d/c coumadin     06/19/2014 f/u ov/Omar [name] re: post coumadin f/u for labs Chief Complaint  Patient presents with  . Follow-up    Pt states that he is overall doing well. Stopped warfarin approx 1 wk ago. No new co's today.    Not limited by breathing from desired activities   Rec Aspirin = 325 mg one daily with bfast Avoid Omar [name] trips/ crossing legs    06/30/2014 f/u ov/Omar [name] re:  Final PE f/u   Chief Complaint  Patient presents with  . Follow-up    pt c/o SOB with exertion, no other complaints at this time.   no change mild doe, really Not limited by breathing from desired activities   rec Continue Aspirin = 325 mg one daily with bfast Avoid Omar [name] trips/ crossing legs   01/19/2017   extended ov/Omar [name] re: consulted re new doe  Chief Complaint  Patient presents with  . Pulmonary Consult    Referred by Dr Omar [name]. Pt states having DOE for the past 4 months, worsening over the past month.   indolent onset 4 month doe noted gradually more difficult to not get from basement to 3 rd floor of church, can still do 2 flight and it's at struggle vs 4 months prior to Omar [name] x one month sob at rest unprovoked lasts 10-15 min couple three x weekly never noct or supine  occ nausea not directly related to activity or sob /fleeting pattern  as is light headedness and neither related to breathing  symptoms chronologically but cards w/u in progress    No obvious day to day or daytime variability or assoc excess/ purulent sputum or mucus plugs or hemoptysis or cp or chest tightness, subjective wheeze or overt sinus or hb symptoms. No unusual exp hx or h/o childhood pna/ asthma or knowledge of premature birth.  Sleeping ok without nocturnal  or early am exacerbation  of respiratory  c/o's or need for noct saba. Also denies any obvious fluctuation of symptoms with weather or environmental changes or other aggravating or alleviating factors except as outlined above   Current Medications, Allergies, Complete Past Medical History, Past Surgical History, Family History, and Social History were reviewed in [employer] record.  ROS  The following are not active complaints unless bolded sore throat, dysphagia, dental problems, itching, sneezing,  nasal congestion or excess/ purulent secretions, ear ache,   fever, chills, sweats, unintended wt loss, classically pleuritic or exertional cp,  orthopnea pnd or leg swelling, presyncope, palpitations, abdominal pain, anorexia, nausea, vomiting, diarrhea  or change in bowel or bladder habits, change in stools or urine, dysuria,hematuria,  rash, arthralgias, visual complaints, headache, numbness, weakness or ataxia or problems with walking or coordination,  change in  mood/affect or memory.                       Objective:   Physical Exam amb wm nad    06/19/2014       204  > 06/30/2014  208  > 01/19/2017  207  06/04/14 204 lb (92.534 kg)  04/25/14 202 lb 12.8 oz (91.989 kg)  04/23/14 203 lb (92.08 kg)      HEENT: nl dentition, turbinates, and oropharynx. Nl external ear canals without cough reflex   NECK :  without JVD/Nodes/TM/ nl carotid upstrokes bilaterally   LUNGS: no acc muscle use, clear to A and P bilaterally without cough on insp or exp maneuvers   CV:  RRR  no s3 or murmur or increase in P2, no edema    ABD:  soft and nontender with nl excursion in the supine position. No bruits or organomegaly, bowel sounds nl  MS:  warm without deformities, calf tenderness, cyanosis or clubbing  SKIN: warm and dry without lesions    NEURO:  alert, approp, no deficits      I personally reviewed images and agree with radiology impression as follows:  CTa Chest   01/11/17 No evidence of significant pulmonary embolus. No evidence of active pulmonary disease. Atelectasis in the lung bases. Left adrenal gland adenoma.          Assessment & Plan:

## 2017-01-22 NOTE — Assessment & Plan Note (Signed)
CTa  01/11/17 wnl  - 01/19/2017  Walked RA x 3 laps @ 185 ft each stopped due to  End of study, nl pace, no sob or desat  Not able to reproduce this problem in office so Symptoms are markedly disproportionate to objective findings and not clear this is a lung problem but pt does appear to have difficult airway management issues. DDX of  difficult airways management almost all start with A and  include Adherence, Ace Inhibitors, Acid Reflux, Active Sinus Disease, Alpha 1 Antitripsin deficiency, Anxiety masquerading as Airways dz,  ABPA,  Allergy(esp in young), Aspiration (esp in elderly), Adverse effects of meds,  Active smokers, A bunch of PE's (a small clot burden can't cause this syndrome unless there is already severe underlying pulm or vascular dz with poor reserve) plus two Bs  = Bronchiectasis and Beta blocker use..and one C= CHF  Adherence is always the initial "prime suspect" and is a multilayered concern that requires a "trust but verify" approach in every patient - starting with knowing how to use medications, especially inhalers, correctly, keeping up with refills and understanding the fundamental difference between maintenance and prns vs those medications only taken for a very short course and then stopped and not refilled.   ? Acid (or non-acid) GERD > always difficult to exclude as up to 75% of pts in some series report no assoc GI/ Heartburn symptoms> rec max (24h)  acid suppression and diet restrictions/ reviewed and instructions given in writing.   ? Allergies/ asthma > no cough or noct symptoms   ? A bunch of PE's excluded by CTa  ? Anxiety > dx of exclusion  ? Cardiac > w/u in progress    I had an extended discussion with the patient reviewing all relevant studies completed to date and  lasting 25 minutes of a 40  minute offic visit /re-  consultation per Dr Loanne Drilling re  non-specific but potentially very serious refractory respiratory symptoms of unknown etiology.  Each  maintenance medication was reviewed in detail including most importantly the difference between maintenance and prns and under what circumstances the prns are to be triggered using an action plan format that is not reflected in the computer generated alphabetically organized AVS.    Please see AVS for specific instructions unique to this office visit that I personally wrote and verbalized to the the pt in detail and then reviewed with pt  by my nurse highlighting any changes in therapy/plan of care  recommended at today's visit.

## 2017-01-23 ENCOUNTER — Encounter: Payer: Self-pay | Admitting: Cardiology

## 2017-01-30 NOTE — Progress Notes (Signed)
HPI: 71 year old male for evaluation of dyspnea. Pt does have h/o PE. Echocardiogram December 2014 showed ejection fraction [id number], grade 1 diastolic dysfunction. Abdominal CT May 2016 showed no aneurysm. CTA 2/18 showed no pulmonary embolus. Seen recently in the emergency room for dyspnea. Electrocardiogram 01/10/2017 showed sinus rhythm with no ST changes. Troponins normal. Creatinine 1.29. Hemoglobin 13.7. BNP 28. For the past several months patient notes dyspnea on exertion. No orthopnea, PND, pedal edema, chest pain or syncope.   Current Outpatient Prescriptions  Medication Sig Dispense Refill  . aspirin 81 MG tablet Take 81 mg by mouth daily.    Marland Kitchen gabapentin (NEURONTIN) 300 MG capsule Take 1 tablet at bedtime for one week, then increase to 1 tablet in the morning and bedtime. 60 capsule 11  . pantoprazole (PROTONIX) 40 MG tablet Take 1 tablet (40 mg total) by mouth daily. 30 tablet 2  . PRESCRIPTION MEDICATION Inject 0.25-0.4 mLs into the skin daily as needed (erectile dysfunction). Penile injection compounded at  [facility]. Trimixture of Prostaglandin E-1, Papaverine, and Phentolamine.    . simvastatin (ZOCOR) 40 MG tablet Take 1 tablet (40 mg total) by mouth at bedtime. 90 tablet 3   No current facility-administered medications for this visit.     Allergies  Allergen Reactions  . Sulfonamide Derivatives Other (See Comments)     blisters on skin     Past Medical History:  Diagnosis Date  . BPH (benign prostatic hypertrophy)   . Decreased white blood cell count   . Depression   . Diverticulosis   . Erectile dysfunction   . Gastric polyps   . GERD (gastroesophageal reflux disease)   . History of colonic polyps   . History of esophageal stricture 10/31/2007  . History of kidney stones   . History of rheumatic fever   . Hyperlipidemia   . Prostate cancer ([place]) 11/09/2012  . Prostatism     Past Surgical History:  Procedure Laterality Date  . CARPAL TUNNEL  RELEASE     Bilateral  . ESOPHAGEAL DILATION     last dilation 8'12  . INGUINAL HERNIA REPAIR    . INSERTION OF MESH N/A 04/15/2013   Procedure: INSERTION OF MESH;  Surgeon: Odis Hollingshead, MD;  Location: [place];  Service: General;  Laterality: N/A;  . KNEE ARTHROSCOPY     left  . LEFT HEART CATHETERIZATION WITH CORONARY ANGIOGRAM N/A 07/18/2014   Procedure: LEFT HEART CATHETERIZATION WITH CORONARY ANGIOGRAM;  Surgeon: Burnell Blanks, MD;  Location: [facility] CATH LAB;  Service: Cardiovascular;  Laterality: N/A;  . ROBOT ASSISTED LAPAROSCOPIC RADICAL PROSTATECTOMY  11/08/2012   Procedure: ROBOTIC ASSISTED LAPAROSCOPIC RADICAL PROSTATECTOMY LEVEL 1;  Surgeon: Molli Hazard, MD;  Location: [facility];  Service: Urology;  Laterality: N/A;     . TONSILLECTOMY    . UMBILICAL HERNIA REPAIR N/A 04/15/2013   Procedure: HERNIA REPAIR UMBILICAL ADULT;  Surgeon: Odis Hollingshead, MD;  Location: [place];  Service: General;  Laterality: N/A;    Social History   Social History  . Marital status: Married    Spouse name: N/A  . Number of children: 4  . Years of education: N/A   Occupational History  . 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 Smoking status: Former Smoker    Packs/day: 1.00    Years: 25.00    Types: Cigarettes    Quit date: 12/05/1993  . Smokeless tobacco: Never Used  . Alcohol use  No  . Drug use: No  . Sexual activity: Yes   Other Topics Concern  . Not on file   Social History Narrative   Lives with wife in a one story home.  Has 4 children.  Works as a Theme park manager.     Education: 10th grade.     Family History  Problem Relation Age of Onset  . Ovarian cancer Mother   . Cancer Mother   . Colon cancer Mother   . Stroke Father   . Lung cancer Maternal Uncle   . Lung cancer Maternal Aunt   . Esophageal cancer Neg Hx   . Stomach cancer Neg Hx   . Rectal cancer Neg Hx     ROS: no fevers or chills, productive cough, hemoptysis, dysphasia,  odynophagia, melena, hematochezia, dysuria, hematuria, rash, seizure activity, orthopnea, PND, pedal edema, claudication. Remaining systems are negative.  Physical Exam:   Blood pressure (!) 150/98, pulse 79, height 6\' 3"  (1.905 m), weight 204 lb (92.5 kg).  General:  Well developed/well nourished in NAD Skin warm/dry Patient not depressed No peripheral clubbing Back-normal HEENT-normal/normal eyelids Neck supple/normal carotid upstroke bilaterally; no JVD; no thyromegaly chest - CTA/ normal expansion CV - RRR/normal S1 and S2; no murmurs, rubs or gallops;  PMI nondisplaced Abdomen -NT/ND, no HSM, no mass, + bowel sounds, no bruit 2+ femoral pulses, no bruits Ext-no edema, chords, 2+ DP Neuro-grossly nonfocal  ECG 01/10/2017-sinus rhythm with no significant ST changes. Today's electrocardiogram shows sinus rhythm with no ST changes.  A/P  1 dyspnea-etiology unclear. He was noted to have coronary calcium on previous CT. I will arrange an exercise treadmill to screen for coronary disease. We will check an echocardiogram for LV function. He also has a history of a pulmonary embolus. The echocardiogram will assess RV function and pulmonary pressures as well.  2 elevated blood pressure-patient has not been diagnosed with hypertension previously and states typically controlled. I've asked him to follow this and if elevated discuss further with primary care.  3 hyperlipidemia-continue statin.  Kirk Ruths, MD

## 2017-02-02 ENCOUNTER — Ambulatory Visit (INDEPENDENT_AMBULATORY_CARE_PROVIDER_SITE_OTHER): Payer: Medicare Other | Admitting: Cardiology

## 2017-02-02 ENCOUNTER — Encounter: Payer: Self-pay | Admitting: *Deleted

## 2017-02-02 VITALS — BP 150/98 | HR 79 | Ht 75.0 in | Wt 204.0 lb

## 2017-02-02 DIAGNOSIS — E7800 Pure hypercholesterolemia, unspecified: Secondary | ICD-10-CM | POA: Diagnosis not present

## 2017-02-02 DIAGNOSIS — R0600 Dyspnea, unspecified: Secondary | ICD-10-CM | POA: Diagnosis not present

## 2017-02-02 NOTE — Patient Instructions (Signed)
Medication Instructions:   NO CHANGE  Testing/Procedures:  Your physician has requested that you have an echocardiogram. Echocardiography is a painless test that uses sound waves to create images of your heart. It provides your doctor with information about the size and shape of your heart and how well your heart's chambers and valves are working. This procedure takes approximately one hour. There are no restrictions for this procedure.   Your physician has requested that you have an exercise tolerance test. For further information please visit [email or web address]. Please also follow instruction sheet, as given.    Follow-Up:  Your physician recommends that you schedule a follow-up appointment in: AS NEEDED PENDING TEST RESULTS    

## 2017-02-08 ENCOUNTER — Telehealth (HOSPITAL_COMMUNITY): Payer: Self-pay

## 2017-02-08 NOTE — Telephone Encounter (Signed)
Encounter complete. 

## 2017-02-09 ENCOUNTER — Telehealth (HOSPITAL_COMMUNITY): Payer: Self-pay

## 2017-02-09 NOTE — Telephone Encounter (Signed)
Encounter complete. 

## 2017-02-10 ENCOUNTER — Telehealth: Payer: Self-pay | Admitting: *Deleted

## 2017-02-10 ENCOUNTER — Ambulatory Visit (HOSPITAL_COMMUNITY)
Admission: RE | Admit: 2017-02-10 | Discharge: 2017-02-10 | Disposition: A | Discharge status: Home / Self Care | Payer: Medicare Other | Source: Ambulatory Visit | Attending: Cardiology | Admitting: Cardiology

## 2017-02-10 DIAGNOSIS — R0600 Dyspnea, unspecified: Secondary | ICD-10-CM

## 2017-02-10 MED ORDER — AMLODIPINE BESYLATE 5 MG PO TABS: 5.0000 mg | ORAL_TABLET | Freq: Every day | ORAL | 3 refills | Status: DC

## 2017-02-10 MED ORDER — PRAVASTATIN SODIUM 40 MG PO TABS: 40.0000 mg | ORAL_TABLET | Freq: Every evening | ORAL | 3 refills | Status: DC

## 2017-02-10 NOTE — Telephone Encounter (Signed)
Patient here for GXT, unable to exercise due to elevated bp. Per dr Stanford Breed, patient to start amlodipine 5 mg once daily. Due to the interaction with simvastatin, the patient will change to pravastatin. Patient voiced understanding

## 2017-02-18 ENCOUNTER — Other Ambulatory Visit: Payer: Self-pay | Admitting: Endocrinology

## 2017-02-22 ENCOUNTER — Ambulatory Visit (HOSPITAL_COMMUNITY): Payer: Medicare Other | Attending: Internal Medicine

## 2017-02-22 ENCOUNTER — Other Ambulatory Visit: Payer: Self-pay

## 2017-02-22 DIAGNOSIS — Z87891 Personal history of nicotine dependence: Secondary | ICD-10-CM | POA: Insufficient documentation

## 2017-02-22 DIAGNOSIS — R0600 Dyspnea, unspecified: Secondary | ICD-10-CM | POA: Insufficient documentation

## 2017-02-22 DIAGNOSIS — Z86711 Personal history of pulmonary embolism: Secondary | ICD-10-CM | POA: Insufficient documentation

## 2017-02-22 DIAGNOSIS — E785 Hyperlipidemia, unspecified: Secondary | ICD-10-CM | POA: Insufficient documentation

## 2017-02-23 ENCOUNTER — Telehealth (HOSPITAL_COMMUNITY): Payer: Self-pay

## 2017-02-23 NOTE — Telephone Encounter (Signed)
Encounter complete. 

## 2017-02-28 ENCOUNTER — Ambulatory Visit (HOSPITAL_COMMUNITY)
Admission: RE | Admit: 2017-02-28 | Discharge: 2017-02-28 | Disposition: A | Discharge status: Home / Self Care | Payer: Medicare Other | Source: Ambulatory Visit | Attending: Cardiovascular Disease | Admitting: Cardiovascular Disease

## 2017-02-28 DIAGNOSIS — R0602 Shortness of breath: Secondary | ICD-10-CM | POA: Diagnosis not present

## 2017-02-28 DIAGNOSIS — R0600 Dyspnea, unspecified: Secondary | ICD-10-CM | POA: Insufficient documentation

## 2017-02-28 LAB — EXERCISE TOLERANCE TEST
CSEPED: 6 min
CSEPEW: 7.8 METS
CSEPPHR: 136 {beats}/min
Exercise duration (sec): 32 s
MPHR: 149 {beats}/min
Percent HR: 91 %
RPE: 17
Rest HR: 74 {beats}/min

## 2017-03-02 ENCOUNTER — Encounter: Payer: Self-pay | Admitting: Gastroenterology

## 2017-03-22 ENCOUNTER — Other Ambulatory Visit: Payer: Self-pay

## 2017-03-22 MED ORDER — PANTOPRAZOLE SODIUM 40 MG PO TBEC: 40.0000 mg | DELAYED_RELEASE_TABLET | Freq: Every day | ORAL | 1 refills | Status: DC

## 2017-03-24 ENCOUNTER — Encounter: Payer: Self-pay | Admitting: Gastroenterology

## 2017-05-17 ENCOUNTER — Encounter: Payer: Medicare Other | Admitting: Gastroenterology

## 2017-05-22 ENCOUNTER — Ambulatory Visit: Payer: Medicare Other | Admitting: Neurology

## 2017-05-22 DIAGNOSIS — Z029 Encounter for administrative examinations, unspecified: Secondary | ICD-10-CM

## 2017-05-23 ENCOUNTER — Encounter: Payer: Self-pay | Admitting: Neurology

## 2017-06-28 ENCOUNTER — Ambulatory Visit (AMBULATORY_SURGERY_CENTER): Payer: Self-pay

## 2017-06-28 VITALS — Ht 75.0 in | Wt 201.8 lb

## 2017-06-28 DIAGNOSIS — Z800 Family history of malignant neoplasm of digestive organs: Secondary | ICD-10-CM

## 2017-06-28 MED ORDER — NA SULFATE-K SULFATE-MG SULF 17.5-3.13-1.6 GM/177ML PO SOLN: 1.0000 | Freq: Once | ORAL | 0 refills | Status: AC

## 2017-06-28 NOTE — Progress Notes (Signed)
Denies allergies to eggs or soy products. Denies complication of anesthesia or sedation. Denies use of weight loss medication. Denies use of O2.   Emmi instructions declined.  

## 2017-07-12 ENCOUNTER — Encounter: Payer: Self-pay | Admitting: Gastroenterology

## 2017-07-12 ENCOUNTER — Ambulatory Visit (AMBULATORY_SURGERY_CENTER): Payer: Medicare Other | Admitting: Gastroenterology

## 2017-07-12 VITALS — BP 119/85 | HR 70 | Temp 98.9°F | Resp 13 | Ht 75.0 in | Wt 201.0 lb

## 2017-07-12 DIAGNOSIS — D124 Benign neoplasm of descending colon: Secondary | ICD-10-CM

## 2017-07-12 DIAGNOSIS — D123 Benign neoplasm of transverse colon: Secondary | ICD-10-CM | POA: Diagnosis not present

## 2017-07-12 DIAGNOSIS — Z1212 Encounter for screening for malignant neoplasm of rectum: Secondary | ICD-10-CM | POA: Diagnosis not present

## 2017-07-12 DIAGNOSIS — Z1211 Encounter for screening for malignant neoplasm of colon: Secondary | ICD-10-CM | POA: Diagnosis present

## 2017-07-12 DIAGNOSIS — K6389 Other specified diseases of intestine: Secondary | ICD-10-CM

## 2017-07-12 DIAGNOSIS — D120 Benign neoplasm of cecum: Secondary | ICD-10-CM | POA: Diagnosis not present

## 2017-07-12 DIAGNOSIS — K635 Polyp of colon: Secondary | ICD-10-CM | POA: Diagnosis not present

## 2017-07-12 DIAGNOSIS — Z800 Family history of malignant neoplasm of digestive organs: Secondary | ICD-10-CM

## 2017-07-12 MED ORDER — SODIUM CHLORIDE 0.9 % IV SOLN: 500.0000 mL | INTRAVENOUS | Status: DC

## 2017-07-12 NOTE — Progress Notes (Signed)
Sent path cecal mass rush per Dr. Silverio Decamp. Corky Sing

## 2017-07-12 NOTE — Op Note (Signed)
[facility] Patient Name: Omar [name] Procedure Date: 07/12/2017 11:20 AM MRN: [id number] Endoscopist: Mauri Pole , MD Age: 71 Referring MD:  Date of Birth: May 29, 1946 Gender: Male Account #: [id number] Procedure:                Colonoscopy Indications:              Colon cancer screening in patient at increased                            risk: Colorectal cancer in mother Medicines:                Monitored Anesthesia Care Procedure:                Pre-Anesthesia Assessment:                           - Prior to the procedure, a History and Physical                            was performed, and patient medications and                            allergies were reviewed. The patient's tolerance of                            previous anesthesia was also reviewed. The risks                            and benefits of the procedure and the sedation                            options and risks were discussed with the patient.                            All questions were answered, and informed consent                            was obtained. Prior Anticoagulants: The patient has                            taken no previous anticoagulant or antiplatelet                            agents. ASA Grade Assessment: II - A patient with                            mild systemic disease. After reviewing the risks                            and benefits, the patient was deemed in                            satisfactory condition to undergo the procedure.  After obtaining informed consent, the colonoscope                            was passed under direct vision. Throughout the                            procedure, the patient's blood pressure, pulse, and                            oxygen saturations were monitored continuously. The                            Colonoscope was introduced through the anus and                            advanced to the the terminal  ileum, with                            identification of the appendiceal orifice and IC                            valve. The colonoscopy was performed without                            difficulty. The patient tolerated the procedure                            well. The quality of the bowel preparation was                            excellent. The terminal ileum, ileocecal valve,                            appendiceal orifice, and rectum were photographed. Scope In: 11:29:52 AM Scope Out: 11:51:40 AM Scope Withdrawal Time: 0 hours 14 minutes 53 seconds  Total Procedure Duration: 0 hours 21 minutes 48 seconds  Findings:                 The perianal and digital rectal examinations were                            normal.                           A 25 mm polyp was found in the cecum extending in                            to appendiceal orifice. The polyp was mixed lateral                            spreading. Biopsies were taken with a cold forceps                            for histology.  Two sessile polyps were found in the descending                            colon and transverse colon. The polyps were 3 to 5                            mm in size. These polyps were removed with a cold                            snare. Resection and retrieval were complete.                           Scattered small-mouthed diverticula were found in                            the sigmoid colon, descending colon, transverse                            colon and ascending colon.                           Non-bleeding internal hemorrhoids were found during                            retroflexion. The hemorrhoids were small. Complications:            No immediate complications. Estimated Blood Loss:     Estimated blood loss was minimal. Impression:               - One 25 mm polyp in the cecum. Biopsied.                           - Two 3 to 5 mm polyps in the descending colon and                             in the transverse colon, removed with a cold snare.                            Resected and retrieved.                           - Diverticulosis in the sigmoid colon, in the                            descending colon, in the transverse colon and in                            the ascending colon.                           - Non-bleeding internal hemorrhoids. Recommendation:           - Patient has a contact number available for  emergencies. The signs and symptoms of potential                            delayed complications were discussed with the                            patient. Return to normal activities tomorrow.                            Written discharge instructions were provided to the                            patient.                           - Resume previous diet.                           - Continue present medications.                           - Await pathology results.                           - Repeat colonoscopy date to be determined after                            pending pathology results are reviewed for                            surveillance based on pathology results.                           - Refer to [facility] for surgical resection given polyp                            is extending to Rhodes, MD 07/12/2017 11:59:56 AM This report has been signed electronically.

## 2017-07-12 NOTE — Progress Notes (Signed)
Called to room to assist during endoscopic procedure.  Patient ID and intended procedure confirmed with present staff. Received instructions for my participation in the procedure from the performing physician.  

## 2017-07-12 NOTE — Patient Instructions (Signed)
Discharge instructions given. Handouts on polyps,diverticulosis and hemorrhoids. Resume previous medications. YOU HAD AN ENDOSCOPIC PROCEDURE TODAY AT THE [facility]:   Refer to the procedure report that was given to you for any specific questions about what was found during the examination.  If the procedure report does not answer your questions, please call your gastroenterologist to clarify.  If you requested that your care partner not be given the details of your procedure findings, then the procedure report has been included in a sealed envelope for you to review at your convenience later.  YOU SHOULD EXPECT: Some feelings of bloating in the abdomen. Passage of more gas than usual.  Walking can help get rid of the air that was put into your GI tract during the procedure and reduce the bloating. If you had a lower endoscopy (such as a colonoscopy or flexible sigmoidoscopy) you may notice spotting of blood in your stool or on the toilet paper. If you underwent a bowel prep for your procedure, you may not have a normal bowel movement for a few days.  Please Note:  You might notice some irritation and congestion in your nose or some drainage.  This is from the oxygen used during your procedure.  There is no need for concern and it should clear up in a day or so.  SYMPTOMS TO REPORT IMMEDIATELY:   Following lower endoscopy (colonoscopy or flexible sigmoidoscopy):  Excessive amounts of blood in the stool  Significant tenderness or worsening of abdominal pains  Swelling of the abdomen that is new, acute  Fever of 100F or higher   For urgent or emergent issues, a gastroenterologist can be reached at any hour by calling [phone].   DIET:  We do recommend a small meal at first, but then you may proceed to your regular diet.  Drink plenty of fluids but you should avoid alcoholic beverages for 24 hours.  ACTIVITY:  You should plan to take it easy for the rest of today and you  should NOT DRIVE or use heavy machinery until tomorrow (because of the sedation medicines used during the test).    FOLLOW UP: Our staff will call the number listed on your records the next business day following your procedure to check on you and address any questions or concerns that you may have regarding the information given to you following your procedure. If we do not reach you, we will leave a message.  However, if you are feeling well and you are not experiencing any problems, there is no need to return our call.  We will assume that you have returned to your regular daily activities without incident.  If any biopsies were taken you will be contacted by phone or by letter within the next 1-3 weeks.  Please call us at [phone] if you have not heard about the biopsies in 3 weeks.    SIGNATURES/CONFIDENTIALITY: You and/or your care partner have signed paperwork which will be entered into your electronic medical record.  These signatures attest to the fact that that the information above on your After Visit Summary has been reviewed and is understood.  Full responsibility of the confidentiality of this discharge information lies with you and/or your care-partner. 

## 2017-07-12 NOTE — Progress Notes (Signed)
A and O x3. Report to RN. Tolerated MAC anesthesia well.

## 2017-07-12 NOTE — Progress Notes (Signed)
Pt's states no medical or surgical changes since previsit or office visit. 

## 2017-07-13 ENCOUNTER — Telehealth: Payer: Self-pay

## 2017-07-13 ENCOUNTER — Telehealth: Payer: Self-pay | Admitting: Internal Medicine

## 2017-07-13 ENCOUNTER — Telehealth: Payer: Self-pay | Admitting: Gastroenterology

## 2017-07-13 NOTE — Telephone Encounter (Signed)
Left message on answering machine. 

## 2017-07-13 NOTE — Telephone Encounter (Signed)
noted 

## 2017-07-13 NOTE — Telephone Encounter (Signed)
Called and spoke with pt and he stated that he was called by GI and he has returned their call. Nothing further is needed.

## 2017-07-17 ENCOUNTER — Telehealth: Payer: Self-pay | Admitting: Endocrinology

## 2017-07-17 MED ORDER — LACTULOSE 20 GM/30ML PO SOLN: 30.0000 mL | Freq: Three times a day (TID) | ORAL | 11 refills | Status: DC

## 2017-07-17 NOTE — Telephone Encounter (Signed)
Patient called in reference to having troubles with bowel movements. Patient stated he had a friend give him 54 MG of Linzess and it did not work. Patient stated he would like this medication called in but for more than 75 MG.   [id number] - [place], [place] [phone] (Phone) [phone] (Fax)   Please call patient with any questions. OK to leave message.

## 2017-07-17 NOTE — Telephone Encounter (Signed)
This is a very expensive medication, and is unlikely to be approved by insurance.  I have sent a prescription to your pharmacy, for a liquid called "lactulose."  Please try this, and let us know if it does not work.

## 2017-07-18 NOTE — Telephone Encounter (Signed)
Called and spoke with PT's wife she said that she would get him to return our call.

## 2017-07-18 NOTE — Telephone Encounter (Signed)
Called and spoke with patient. He stated that he would pick the prescription up from his pharmacy and give it a try.

## 2017-07-18 NOTE — Telephone Encounter (Signed)
Patient returning missed phone call. Please call patient and advise at [phone]. Ok to leave message.

## 2017-07-18 NOTE — Telephone Encounter (Signed)
The lactulose will work if it is enough.  The linzess won't be approved unless you try something else first.

## 2017-07-18 NOTE — Telephone Encounter (Signed)
I contacted the patient and he stated the Lactulose would not work for him and wanted to know if we could send a alternative?

## 2017-07-20 ENCOUNTER — Telehealth: Payer: Self-pay | Admitting: *Deleted

## 2017-07-20 NOTE — Telephone Encounter (Signed)
Patients appointment for [facility] to see Dr Hassell Done is scheduled on 08/11/2017 at 4pm   Informed by [facility]

## 2017-07-25 ENCOUNTER — Ambulatory Visit: Payer: Medicare Other | Admitting: Endocrinology

## 2017-07-25 ENCOUNTER — Telehealth: Payer: Self-pay | Admitting: Endocrinology

## 2017-07-25 NOTE — Telephone Encounter (Signed)
Forwarding

## 2017-07-25 NOTE — Telephone Encounter (Signed)
Patient is scheduled for an acute visit 8/22 at 1:45pm for a itchy rash everywhere. Could patient be fitted in today? Please call patient to advise.

## 2017-07-25 NOTE — Telephone Encounter (Signed)
Please advise 

## 2017-07-26 ENCOUNTER — Ambulatory Visit: Payer: Medicare Other | Admitting: Endocrinology

## 2017-07-26 ENCOUNTER — Ambulatory Visit (INDEPENDENT_AMBULATORY_CARE_PROVIDER_SITE_OTHER): Payer: Medicare Other | Admitting: Endocrinology

## 2017-07-26 ENCOUNTER — Encounter: Payer: Self-pay | Admitting: Endocrinology

## 2017-07-26 ENCOUNTER — Other Ambulatory Visit: Payer: Self-pay

## 2017-07-26 DIAGNOSIS — L299 Pruritus, unspecified: Secondary | ICD-10-CM | POA: Diagnosis not present

## 2017-07-26 MED ORDER — TRIAMCINOLONE ACETONIDE 0.025 % EX OINT: 1.0000 "application " | TOPICAL_OINTMENT | Freq: Four times a day (QID) | CUTANEOUS | 1 refills | Status: DC

## 2017-07-26 MED ORDER — LINACLOTIDE 145 MCG PO CAPS: 145.0000 ug | ORAL_CAPSULE | Freq: Every day | ORAL | 11 refills | Status: DC

## 2017-07-26 NOTE — Progress Notes (Signed)
Subjective:    Patient ID: Omar Carbon., male    DOB: 1946-09-01, 71 y.o.   MRN: [id number]  HPI Pt reports few days of moderate itching throughout the body, and assoc rash.  unable to cite precip factor.  Rash is better now.   Since on lactulose x 1 month, constipation is not better.   Past Medical History:  Diagnosis Date  . Anxiety   . BPH (benign prostatic hypertrophy)   . Decreased white blood cell count   . Depression   . Diverticulosis   . Erectile dysfunction   . Gastric polyps   . GERD (gastroesophageal reflux disease)   . History of colonic polyps   . History of esophageal stricture 10/31/2007  . History of kidney stones   . History of rheumatic fever   . Hyperlipidemia   . Prostate cancer ([place]) 11/09/2012  . Prostatism     Past Surgical History:  Procedure Laterality Date  . CARPAL TUNNEL RELEASE     Bilateral  . ESOPHAGEAL DILATION     last dilation 8'12  . INGUINAL HERNIA REPAIR    . INSERTION OF MESH N/A 04/15/2013   Procedure: INSERTION OF MESH;  Surgeon: Odis Hollingshead, MD;  Location: [place];  Service: General;  Laterality: N/A;  . KNEE ARTHROSCOPY     left  . LEFT HEART CATHETERIZATION WITH CORONARY ANGIOGRAM N/A 07/18/2014   Procedure: LEFT HEART CATHETERIZATION WITH CORONARY ANGIOGRAM;  Surgeon: Burnell Blanks, MD;  Location: [facility];  Service: Cardiovascular;  Laterality: N/A;  . ROBOT ASSISTED LAPAROSCOPIC RADICAL PROSTATECTOMY  11/08/2012   Procedure: ROBOTIC ASSISTED LAPAROSCOPIC RADICAL PROSTATECTOMY LEVEL 1;  Surgeon: Molli Hazard, MD;  Location[facility];  Service: Urology;  Laterality: N/A;     . TONSILLECTOMY    . UMBILICAL HERNIA REPAIR N/A 04/15/2013   Procedure: HERNIA REPAIR UMBILICAL ADULT;  Surgeon: Odis Hollingshead, MD;  Location: [place];  Service: General;  Laterality: N/A;    Social History   Social History  . Marital status: Married    Spouse name: N/A  . Number of children: 4  . Years of education: N/A     Occupational History  . 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 Smoking status: Former Smoker    Packs/day: 1.00    Years: 25.00    Types: Cigarettes    Quit date: 12/05/1993  . Smokeless tobacco: Never Used  . Alcohol use No  . Drug use: No  . Sexual activity: Yes   Other Topics Concern  . Not on file   Social History Narrative   Lives with wife in a one story home.  Has 4 children.  Works as a Theme park manager.     Education: 10th grade.     Current Outpatient Prescriptions on File Prior to Visit  Medication Sig Dispense Refill  . amLODipine (NORVASC) 5 MG tablet Take 1 tablet (5 mg total) by mouth daily. 90 tablet 3  . aspirin 81 MG tablet Take 81 mg by mouth daily.    Marland Kitchen gabapentin (NEURONTIN) 300 MG capsule Take 1 tablet at bedtime for one week, then increase to 1 tablet in the morning and bedtime. 60 capsule 11  . Lactulose 20 GM/30ML SOLN Take 30 mLs (20 g total) by mouth 3 (three) times daily. 1892 mL 11  . pantoprazole (PROTONIX) 40 MG tablet Take 1 tablet (40 mg total) by mouth daily. 90 tablet 1  .  pravastatin (PRAVACHOL) 40 MG tablet Take 1 tablet (40 mg total) by mouth every evening. 90 tablet 3  . PRESCRIPTION MEDICATION Inject 0.25-0.4 mLs into the skin daily as needed (erectile dysfunction). Penile injection compounded at  [facility]. Trimixture of Prostaglandin E-1, Papaverine, and Phentolamine.    . [DISCONTINUED] vardenafil (LEVITRA) 20 MG tablet Take 20 mg by mouth as needed.       Current Facility-Administered Medications on File Prior to Visit  Medication Dose Route Frequency Provider Last Rate Last Dose  . 0.9 %  sodium chloride infusion  500 mL Intravenous Continuous Nandigam, Venia Minks, MD        Allergies  Allergen Reactions  . Sulfonamide Derivatives Other (See Comments)     blisters on skin    Family History  Problem Relation Age of Onset  . Ovarian cancer Mother   . Cancer Mother   . Colon cancer Mother   .  Stroke Father   . Lung cancer Maternal Uncle   . Lung cancer Maternal Aunt   . Esophageal cancer Neg Hx   . Stomach cancer Neg Hx   . Rectal cancer Neg Hx     BP 124/76   Pulse 78   Wt 198 lb 12.8 oz (90.2 kg)   SpO2 98%   BMI 24.85 kg/m   Review of Systems Denies fever and sob.     Objective:   Physical Exam VITAL SIGNS:  See vs page.  GENERAL: no distress.  Skin: I do not see a rash.       Assessment & Plan:  Constipation: not improved Itching, new to me.    Patient Instructions  I have sent a prescription to your pharmacy, for the rash I hope you feel better soon.  If the rash is not better by next week, please call back.  Please call sooner if you get worse.   I have sent a prescription to your pharmacy, for "linzess."

## 2017-07-26 NOTE — Patient Instructions (Addendum)
I have sent a prescription to your pharmacy, for the rash I hope you feel better soon.  If the rash is not better by next week, please call back.  Please call sooner if you get worse.   I have sent a prescription to your pharmacy, for "linzess."

## 2017-07-28 DIAGNOSIS — L299 Pruritus, unspecified: Secondary | ICD-10-CM | POA: Insufficient documentation

## 2017-08-09 ENCOUNTER — Telehealth: Payer: Self-pay | Admitting: Gastroenterology

## 2017-08-09 NOTE — Telephone Encounter (Signed)
Spoke with the patient regarding his pathology report. He will see the surgeon on 08/11/17. Understands he will possibly need a resection of his colon. He says he thanks God he had the colonoscopy done.

## 2017-08-11 DIAGNOSIS — K639 Disease of intestine, unspecified: Secondary | ICD-10-CM | POA: Diagnosis not present

## 2017-08-15 NOTE — Progress Notes (Signed)
Please place orders in EPIC as patient is being scheduled for a pre-op appointment! Thank you! 

## 2017-08-16 NOTE — Progress Notes (Signed)
Please place orders in EPIC as patient is being scheduled for a pre-op appointment! Thank you! 

## 2017-08-22 NOTE — Patient Instructions (Signed)
Omar [name].  08/22/2017   Your procedure is scheduled on: 08-31-17  Report to [facility] Main  Entrance Take [place]  elevators to 3rd floor to  [place] at 730AM.   Call this number if you have problems the morning of surgery [phone]   Remember: ONLY 1 PERSON MAY GO WITH YOU TO SHORT STAY TO GET  READY MORNING OF YOUR SURGERY.  Do not eat food or drink liquids :After Midnight.     Take these medicines the morning of surgery with A SIP OF WATER: pantoprazole(protonix)               You may not have any metal on your body including hair pins and              piercings  Do not wear jewelry, make-up, lotions, powders or perfumes, deodorant                          Men may shave face and neck.   Do not bring valuables to the hospital. [place].  Contacts, dentures or bridgework may not be worn into surgery.  Leave suitcase in the car. After surgery it may be brought to your room.               Please read over the following fact sheets you were given: _____________________________________________________________________             [facility] - Preparing for Surgery Before surgery, you can play an important role.  Because skin is not sterile, your skin needs to be as free of germs as possible.  You can reduce the number of germs on your skin by washing with CHG (chlorahexidine gluconate) soap before surgery.  CHG is an antiseptic cleaner which kills germs and bonds with the skin to continue killing germs even after washing. Please DO NOT use if you have an allergy to CHG or antibacterial soaps.  If your skin becomes reddened/irritated stop using the CHG and inform your nurse when you arrive at Short Stay. Do not shave (including legs and underarms) for at least 48 hours prior to the first CHG shower.  You may shave your face/neck. Please follow these instructions carefully:  1.  Shower with  CHG Soap the night before surgery and the  morning of Surgery.  2.  If you choose to wash your hair, wash your hair first as usual with your  normal  shampoo.  3.  After you shampoo, rinse your hair and body thoroughly to remove the  shampoo.                           4.  Use CHG as you would any other liquid soap.  You can apply chg directly  to the skin and wash                       Gently with a scrungie or clean washcloth.  5.  Apply the CHG Soap to your body ONLY FROM THE NECK DOWN.   Do not use on face/ open  Wound or open sores. Avoid contact with eyes, ears mouth and genitals (private parts).                       Wash face,  Genitals (private parts) with your normal soap.             6.  Wash thoroughly, paying special attention to the area where your surgery  will be performed.  7.  Thoroughly rinse your body with warm water from the neck down.  8.  DO NOT shower/wash with your normal soap after using and rinsing off  the CHG Soap.                9.  Pat yourself dry with a clean towel.            10.  Wear clean pajamas.            11.  Place clean sheets on your bed the night of your first shower and do not  sleep with pets. Day of Surgery : Do not apply any lotions/deodorants the morning of surgery.  Please wear clean clothes to the hospital/surgery center.  FAILURE TO FOLLOW THESE INSTRUCTIONS MAY RESULT IN THE CANCELLATION OF YOUR SURGERY PATIENT SIGNATURE_________________________________  NURSE SIGNATURE__________________________________  ________________________________________________________________________

## 2017-08-22 NOTE — Progress Notes (Signed)
[place] cardiology Dr Stanford Breed 02-02-17 epic  Exercise Tolerance Test 02-28-17 epic  ECHO 02-22-17 epic  EKG 02-02-17 epic  CT chest 01-11-17 epic

## 2017-08-23 ENCOUNTER — Encounter: Payer: Self-pay | Admitting: Endocrinology

## 2017-08-23 ENCOUNTER — Encounter (INDEPENDENT_AMBULATORY_CARE_PROVIDER_SITE_OTHER): Payer: Self-pay

## 2017-08-23 ENCOUNTER — Encounter (HOSPITAL_COMMUNITY)
Admission: RE | Admit: 2017-08-23 | Discharge: 2017-08-23 | Disposition: A | Discharge status: Home / Self Care | Payer: Medicare Other | Source: Ambulatory Visit | Attending: Surgery | Admitting: Surgery

## 2017-08-23 ENCOUNTER — Encounter (HOSPITAL_COMMUNITY): Payer: Self-pay

## 2017-08-23 ENCOUNTER — Ambulatory Visit (INDEPENDENT_AMBULATORY_CARE_PROVIDER_SITE_OTHER): Payer: Medicare Other

## 2017-08-23 DIAGNOSIS — Z01812 Encounter for preprocedural laboratory examination: Secondary | ICD-10-CM | POA: Diagnosis not present

## 2017-08-23 DIAGNOSIS — Z23 Encounter for immunization: Secondary | ICD-10-CM

## 2017-08-23 DIAGNOSIS — D120 Benign neoplasm of cecum: Secondary | ICD-10-CM | POA: Diagnosis not present

## 2017-08-23 HISTORY — DX: Essential (primary) hypertension: I10

## 2017-08-23 LAB — BASIC METABOLIC PANEL
Anion gap: 8 (ref 5–15)
BUN: 12 mg/dL (ref 6–20)
CALCIUM: 9.2 mg/dL (ref 8.9–10.3)
CO2: 27 mmol/L (ref 22–32)
Chloride: 104 mmol/L (ref 101–111)
Creatinine, Ser: 1.11 mg/dL (ref 0.61–1.24)
GFR calc Af Amer: >60 mL/min (ref >60)
Glucose, Bld: 89 mg/dL (ref 65–99)
Potassium: 4.4 mmol/L (ref 3.5–5.1)
SODIUM: 139 mmol/L (ref 135–145)

## 2017-08-23 LAB — CBC
HCT: 42.8 % (ref 39.0–52.0)
Hemoglobin: 14.3 g/dL (ref 13.0–17.0)
MCH: 27.6 pg (ref 26.0–34.0)
MCHC: 33.4 g/dL (ref 30.0–36.0)
MCV: 82.5 fL (ref 78.0–100.0)
PLATELETS: 189 10*3/uL (ref 150–400)
RBC: 5.19 MIL/uL (ref 4.22–5.81)
RDW: 13.1 % (ref 11.5–15.5)
WBC: 2.9 10*3/uL — ABNORMAL LOW (ref 4.0–10.5)

## 2017-08-23 NOTE — Progress Notes (Signed)
CBC routed via epic to Dr Johnathan Hausen

## 2017-08-24 ENCOUNTER — Ambulatory Visit: Payer: Self-pay | Admitting: General Surgery

## 2017-08-30 ENCOUNTER — Ambulatory Visit: Payer: Self-pay | Admitting: Surgery

## 2017-08-30 NOTE — H&P (Signed)
Omar [name] 08/11/2017 4:56 PM Location: [facility] Patient #: [id number] DOB: 11/06/1946 Married / Language: English / Race: Black or African American Male   History of Present Illness Omar [name] B. Omar Done MD; 08/11/2017 5:29 PM) The patient is a 71 year old male who presents with a colonic polyp. The patient was referred by a gastroenterologist (Omar [name] of the cecum with focal high grade dysplasia per Dr. Silverio [name]). He comes in with his wife and his granddaughter to discuss the polyp in his right colon. I explained a right hemicolectomy to him in some detail including complications not limited to bleeding bowel perforation or anastomotic leaks. He has had prior laparoscopic prostate surgery and I would think this would be a laparoscopically assisted right hemicolectomy.  He apparently had DVT after his prostate operation was on anticoagulants for about 3 months. He has a trip planned for early October and wants to do this after that. I will fill out the forms for lap assisted right hemicolectomy at [name] good time for him.   Past Surgical History Omar [name], [place]; 08/11/2017 4:56 PM) Knee Surgery  Left. TURP   Diagnostic Studies History Omar [name], [place]; 08/11/2017 4:56 PM) Colonoscopy  5-10 years ago  Allergies Omar [name], [place]; 08/11/2017 4:58 PM) Sulfacetamide *CHEMICALS*  Allergies Reconciled   Medication History Omar [name], RMA; 08/11/2017 5:02 PM) Triamcinolone Acetonide (0.025% Ointment, External) Active. Lactulose (10GM/15ML Solution, Oral) Active. Pantoprazole Sodium (40MG  Tablet DR, Oral) Active. Simvastatin (20MG  Tablet, Oral) Active. Medications Reconciled  Social History Omar [name], [place]; 08/11/2017 4:56 PM) Alcohol use  Remotely quit alcohol use. Caffeine use  Carbonated beverages, Coffee, Tea. Illicit drug use  Remotely quit drug use. Tobacco use  Former smoker.  Family History  Omar [name], [place]; 08/11/2017 4:56 PM) Arthritis  Omar [name]. Breast Cancer  Omar [name]. Cerebrovascular Accident  Omar [name]. Hypertension  Omar [name]. Migraine Headache  Omar [name]. Ovarian Cancer  Omar [name].  Other Problems Omar [name], [place]; 08/11/2017 4:56 PM) Enlarged Prostate  Gastroesophageal Reflux Disease  Hemorrhoids  Hypercholesterolemia  Prostate Cancer  Sleep Apnea     Review of Systems Omar [name] RMA; 08/11/2017 4:56 PM) General Present- Fatigue and Night Sweats. Not Present- Appetite Loss, Chills, Fever, Weight Gain and Weight Loss. HEENT Present- Hearing Loss and Wears glasses/contact lenses. Not Present- Earache, Hoarseness, Nose Bleed, Oral Ulcers, Ringing in the Ears, Seasonal Allergies, Sinus Pain, Sore Throat, Visual Disturbances and Yellow Eyes. Respiratory Present- Snoring. Not Present- Bloody sputum, Chronic Cough, Difficulty Breathing and Wheezing. Gastrointestinal Present- Constipation and Nausea. Not Present- Abdominal Pain, Bloating, Bloody Stool, Change in Bowel Habits, Chronic diarrhea, Difficulty Swallowing, Excessive gas, Gets full quickly at meals, Hemorrhoids, Indigestion, Rectal Pain and Vomiting. Male Genitourinary Present- Frequency and Urine Leakage. Not Present- Blood in Urine, Change in Urinary Stream, Impotence, Nocturia, Painful Urination and Urgency.  Vitals Omar [name] RMA; 08/11/2017 5:03 PM) 08/11/2017 5:02 PM Weight: 200.6 lb Height: 75in Body Surface Area: 2.2 m Body Mass Index: 25.07 kg/m  Temp.: 98.33F  Pulse: 87 (Regular)  BP: 130/86 (Sitting, Left Arm, Standard)       Physical Exam (Omar [name] B. Omar Done MD; 08/11/2017 5:31 PM) General Note: Well maintained thin older AAM NAD HEENT unremarkable except glasses Neck no bruits Chest clear Heart SR without murmurs Abdomen nontender Ext FROM--hx of DVT after prostate cancer surgery     Assessment & Plan Omar [name] B. Omar Done MD; 08/11/2017 5:35  PM) CECUM MASS (K63.9) Impression: schedule lap assisted right hemicolectomy for polyp at [name]. I have discussed  this procedure with him in detail including complications not limited to bleeding and anastomotic leaks. He will schedule in October.

## 2017-08-31 ENCOUNTER — Encounter (HOSPITAL_COMMUNITY)
Admission: RE | Disposition: A | Discharge status: Home / Self Care | Payer: Self-pay | Source: Ambulatory Visit | Attending: Surgery

## 2017-08-31 ENCOUNTER — Encounter (HOSPITAL_COMMUNITY): Payer: Self-pay

## 2017-08-31 ENCOUNTER — Inpatient Hospital Stay (HOSPITAL_COMMUNITY)
Admission: RE | Admit: 2017-08-31 | Discharge: 2017-09-04 | DRG: 331 | Disposition: A | Discharge status: Home / Self Care | Payer: Medicare Other | Source: Ambulatory Visit | Attending: Surgery | Admitting: Surgery

## 2017-08-31 ENCOUNTER — Inpatient Hospital Stay (HOSPITAL_COMMUNITY): Payer: Medicare Other | Admitting: Anesthesiology

## 2017-08-31 DIAGNOSIS — Z86718 Personal history of other venous thrombosis and embolism: Secondary | ICD-10-CM

## 2017-08-31 DIAGNOSIS — Z8546 Personal history of malignant neoplasm of prostate: Secondary | ICD-10-CM

## 2017-08-31 DIAGNOSIS — Z87891 Personal history of nicotine dependence: Secondary | ICD-10-CM

## 2017-08-31 DIAGNOSIS — D120 Benign neoplasm of cecum: Secondary | ICD-10-CM | POA: Diagnosis present

## 2017-08-31 DIAGNOSIS — Z79899 Other long term (current) drug therapy: Secondary | ICD-10-CM | POA: Diagnosis not present

## 2017-08-31 DIAGNOSIS — G609 Hereditary and idiopathic neuropathy, unspecified: Secondary | ICD-10-CM | POA: Diagnosis present

## 2017-08-31 DIAGNOSIS — D122 Benign neoplasm of ascending colon: Secondary | ICD-10-CM | POA: Diagnosis not present

## 2017-08-31 DIAGNOSIS — K219 Gastro-esophageal reflux disease without esophagitis: Secondary | ICD-10-CM | POA: Diagnosis present

## 2017-08-31 DIAGNOSIS — Z86711 Personal history of pulmonary embolism: Secondary | ICD-10-CM

## 2017-08-31 DIAGNOSIS — F329 Major depressive disorder, single episode, unspecified: Secondary | ICD-10-CM | POA: Diagnosis present

## 2017-08-31 DIAGNOSIS — K635 Polyp of colon: Secondary | ICD-10-CM | POA: Diagnosis present

## 2017-08-31 DIAGNOSIS — Z9049 Acquired absence of other specified parts of digestive tract: Secondary | ICD-10-CM

## 2017-08-31 DIAGNOSIS — E7800 Pure hypercholesterolemia, unspecified: Secondary | ICD-10-CM | POA: Diagnosis present

## 2017-08-31 DIAGNOSIS — C61 Malignant neoplasm of prostate: Secondary | ICD-10-CM | POA: Diagnosis not present

## 2017-08-31 DIAGNOSIS — G4730 Sleep apnea, unspecified: Secondary | ICD-10-CM | POA: Diagnosis present

## 2017-08-31 DIAGNOSIS — I2510 Atherosclerotic heart disease of native coronary artery without angina pectoris: Secondary | ICD-10-CM | POA: Diagnosis present

## 2017-08-31 DIAGNOSIS — I10 Essential (primary) hypertension: Secondary | ICD-10-CM | POA: Diagnosis present

## 2017-08-31 DIAGNOSIS — E785 Hyperlipidemia, unspecified: Secondary | ICD-10-CM | POA: Diagnosis not present

## 2017-08-31 HISTORY — PX: LAPAROSCOPIC RIGHT HEMI COLECTOMY: SHX5926

## 2017-08-31 LAB — HEMOGLOBIN A1C
HEMOGLOBIN A1C: 5.5 % (ref 4.8–5.6)
Mean Plasma Glucose: 111.15 mg/dL

## 2017-08-31 LAB — CBC
HCT: 44 % (ref 39.0–52.0)
Hemoglobin: 14.8 g/dL (ref 13.0–17.0)
MCH: 27.6 pg (ref 26.0–34.0)
MCHC: 33.6 g/dL (ref 30.0–36.0)
MCV: 82.1 fL (ref 78.0–100.0)
PLATELETS: 195 10*3/uL (ref 150–400)
RBC: 5.36 MIL/uL (ref 4.22–5.81)
RDW: 13.1 % (ref 11.5–15.5)
WBC: 8.5 10*3/uL (ref 4.0–10.5)

## 2017-08-31 LAB — CREATININE, SERUM
Creatinine, Ser: 1.15 mg/dL (ref 0.61–1.24)
GFR calc Af Amer: >60 mL/min (ref >60)
GFR calc non Af Amer: >60 mL/min (ref >60)

## 2017-08-31 SURGERY — LAPAROSCOPIC RIGHT HEMI COLECTOMY
Anesthesia: General | Site: Abdomen | Laterality: Right

## 2017-08-31 MED ORDER — ROCURONIUM BROMIDE 50 MG/5ML IV SOSY
PREFILLED_SYRINGE | INTRAVENOUS | Status: AC
  Filled 2017-08-31: qty 5

## 2017-08-31 MED ORDER — LIDOCAINE 2% (20 MG/ML) 5 ML SYRINGE
INTRAMUSCULAR | Status: AC
  Filled 2017-08-31: qty 5

## 2017-08-31 MED ORDER — BUPIVACAINE LIPOSOME 1.3 % IJ SUSP
20.0000 mL | Freq: Once | INTRAMUSCULAR | Status: AC
Start: 2017-08-31 — End: 2017-08-31
  Administered 2017-08-31: 20 mL
  Filled 2017-08-31: qty 20

## 2017-08-31 MED ORDER — HEPARIN SODIUM (PORCINE) 5000 UNIT/ML IJ SOLN
5000.0000 [IU] | Freq: Once | INTRAMUSCULAR | Status: AC
  Administered 2017-08-31: 5000 [IU] via SUBCUTANEOUS
  Filled 2017-08-31: qty 1

## 2017-08-31 MED ORDER — HYDRALAZINE HCL 20 MG/ML IJ SOLN: 10.0000 mg | INTRAMUSCULAR | Status: DC | PRN

## 2017-08-31 MED ORDER — ALVIMOPAN 12 MG PO CAPS
12.0000 mg | ORAL_CAPSULE | Freq: Two times a day (BID) | ORAL | Status: DC
  Administered 2017-09-01 – 2017-09-02 (×3): 12 mg via ORAL
  Filled 2017-08-31 (×3): qty 1

## 2017-08-31 MED ORDER — ALVIMOPAN 12 MG PO CAPS
12.0000 mg | ORAL_CAPSULE | ORAL | Status: AC
  Administered 2017-08-31: 12 mg via ORAL
  Filled 2017-08-31: qty 1

## 2017-08-31 MED ORDER — HYDROMORPHONE HCL-NACL 0.5-0.9 MG/ML-% IV SOSY: 0.2500 mg | PREFILLED_SYRINGE | INTRAVENOUS | Status: DC | PRN

## 2017-08-31 MED ORDER — CEFOTETAN DISODIUM-DEXTROSE 2-2.08 GM-% IV SOLR
2.0000 g | INTRAVENOUS | Status: AC
  Administered 2017-08-31: 2 g via INTRAVENOUS
  Filled 2017-08-31: qty 50

## 2017-08-31 MED ORDER — ROCURONIUM BROMIDE 100 MG/10ML IV SOLN
INTRAVENOUS | Status: DC | PRN
  Administered 2017-08-31: 20 mg via INTRAVENOUS
  Administered 2017-08-31 (×2): 10 mg via INTRAVENOUS
  Administered 2017-08-31: 50 mg via INTRAVENOUS

## 2017-08-31 MED ORDER — EPHEDRINE 5 MG/ML INJ
INTRAVENOUS | Status: AC
  Filled 2017-08-31: qty 10

## 2017-08-31 MED ORDER — LIDOCAINE HCL (CARDIAC) 20 MG/ML IV SOLN
INTRAVENOUS | Status: DC | PRN
  Administered 2017-08-31: 50 mg via INTRATRACHEAL

## 2017-08-31 MED ORDER — ONDANSETRON HCL 4 MG/2ML IJ SOLN
INTRAMUSCULAR | Status: DC | PRN
  Administered 2017-08-31: 4 mg via INTRAVENOUS

## 2017-08-31 MED ORDER — CHLORHEXIDINE GLUCONATE CLOTH 2 % EX PADS: 6.0000 | MEDICATED_PAD | Freq: Once | CUTANEOUS | Status: DC

## 2017-08-31 MED ORDER — PROPOFOL 10 MG/ML IV BOLUS
INTRAVENOUS | Status: AC
  Filled 2017-08-31: qty 20

## 2017-08-31 MED ORDER — BUPIVACAINE HCL (PF) 0.5 % IJ SOLN
INTRAMUSCULAR | Status: DC | PRN
  Administered 2017-08-31: 10 mL

## 2017-08-31 MED ORDER — DEXAMETHASONE SODIUM PHOSPHATE 10 MG/ML IJ SOLN
INTRAMUSCULAR | Status: AC
  Filled 2017-08-31: qty 1

## 2017-08-31 MED ORDER — DEXAMETHASONE SODIUM PHOSPHATE 10 MG/ML IJ SOLN
INTRAMUSCULAR | Status: DC | PRN
  Administered 2017-08-31: 10 mg via INTRAVENOUS

## 2017-08-31 MED ORDER — SACCHAROMYCES BOULARDII 250 MG PO CAPS
250.0000 mg | ORAL_CAPSULE | Freq: Two times a day (BID) | ORAL | Status: DC
  Administered 2017-08-31 – 2017-09-04 (×8): 250 mg via ORAL
  Filled 2017-08-31 (×8): qty 1

## 2017-08-31 MED ORDER — FENTANYL CITRATE (PF) 250 MCG/5ML IJ SOLN
INTRAMUSCULAR | Status: AC
  Filled 2017-08-31: qty 5

## 2017-08-31 MED ORDER — LACTATED RINGERS IR SOLN
Status: DC | PRN
  Administered 2017-08-31: 1000 mL

## 2017-08-31 MED ORDER — PROPOFOL 10 MG/ML IV BOLUS
INTRAVENOUS | Status: DC | PRN
  Administered 2017-08-31: 120 mg via INTRAVENOUS

## 2017-08-31 MED ORDER — 0.9 % SODIUM CHLORIDE (POUR BTL) OPTIME
TOPICAL | Status: DC | PRN
  Administered 2017-08-31: 2000 mL

## 2017-08-31 MED ORDER — MIDAZOLAM HCL 2 MG/2ML IJ SOLN
INTRAMUSCULAR | Status: DC | PRN
  Administered 2017-08-31: 2 mg via INTRAVENOUS

## 2017-08-31 MED ORDER — HEPARIN SODIUM (PORCINE) 5000 UNIT/ML IJ SOLN
5000.0000 [IU] | Freq: Three times a day (TID) | INTRAMUSCULAR | Status: DC
  Administered 2017-08-31 – 2017-09-04 (×12): 5000 [IU] via SUBCUTANEOUS
  Filled 2017-08-31 (×12): qty 1

## 2017-08-31 MED ORDER — KCL IN DEXTROSE-NACL 20-5-0.45 MEQ/L-%-% IV SOLN
INTRAVENOUS | Status: DC
  Administered 2017-08-31 – 2017-09-02 (×3): via INTRAVENOUS
  Administered 2017-09-02: 1000 mL via INTRAVENOUS
  Administered 2017-09-02 – 2017-09-04 (×4): via INTRAVENOUS
  Filled 2017-08-31 (×11): qty 1000

## 2017-08-31 MED ORDER — FENTANYL CITRATE (PF) 250 MCG/5ML IJ SOLN
INTRAMUSCULAR | Status: DC | PRN
  Administered 2017-08-31 (×3): 50 ug via INTRAVENOUS
  Administered 2017-08-31: 100 ug via INTRAVENOUS

## 2017-08-31 MED ORDER — SUGAMMADEX SODIUM 200 MG/2ML IV SOLN
INTRAVENOUS | Status: DC | PRN
  Administered 2017-08-31: 200 mg via INTRAVENOUS

## 2017-08-31 MED ORDER — CELECOXIB 200 MG PO CAPS
200.0000 mg | ORAL_CAPSULE | ORAL | Status: AC
  Administered 2017-08-31: 200 mg via ORAL
  Filled 2017-08-31: qty 1

## 2017-08-31 MED ORDER — PROMETHAZINE HCL 25 MG/ML IJ SOLN: 6.2500 mg | INTRAMUSCULAR | Status: DC | PRN

## 2017-08-31 MED ORDER — GABAPENTIN 300 MG PO CAPS
300.0000 mg | ORAL_CAPSULE | ORAL | Status: AC
  Administered 2017-08-31: 300 mg via ORAL
  Filled 2017-08-31: qty 1

## 2017-08-31 MED ORDER — LACTATED RINGERS IV SOLN
INTRAVENOUS | Status: DC
  Administered 2017-08-31 (×2): via INTRAVENOUS

## 2017-08-31 MED ORDER — BUPIVACAINE HCL (PF) 0.5 % IJ SOLN
INTRAMUSCULAR | Status: AC
  Filled 2017-08-31: qty 30

## 2017-08-31 MED ORDER — ACETAMINOPHEN 500 MG PO TABS
1000.0000 mg | ORAL_TABLET | ORAL | Status: AC
  Administered 2017-08-31: 1000 mg via ORAL
  Filled 2017-08-31: qty 2

## 2017-08-31 MED ORDER — ONDANSETRON HCL 4 MG PO TABS: 4.0000 mg | ORAL_TABLET | Freq: Four times a day (QID) | ORAL | Status: DC | PRN

## 2017-08-31 MED ORDER — LABETALOL HCL 5 MG/ML IV SOLN
INTRAVENOUS | Status: AC
  Filled 2017-08-31: qty 8

## 2017-08-31 MED ORDER — ONDANSETRON HCL 4 MG/2ML IJ SOLN: 4.0000 mg | Freq: Four times a day (QID) | INTRAMUSCULAR | Status: DC | PRN

## 2017-08-31 MED ORDER — DEXTROSE 5 % IV SOLN
2.0000 g | Freq: Two times a day (BID) | INTRAVENOUS | Status: AC
  Administered 2017-08-31: 2 g via INTRAVENOUS
  Filled 2017-08-31: qty 2

## 2017-08-31 MED ORDER — ONDANSETRON HCL 4 MG/2ML IJ SOLN
INTRAMUSCULAR | Status: AC
  Filled 2017-08-31: qty 2

## 2017-08-31 MED ORDER — EPHEDRINE SULFATE 50 MG/ML IJ SOLN
INTRAMUSCULAR | Status: DC | PRN
  Administered 2017-08-31: 5 mg via INTRAVENOUS

## 2017-08-31 MED ORDER — LABETALOL HCL 5 MG/ML IV SOLN
INTRAVENOUS | Status: DC | PRN
  Administered 2017-08-31 (×3): 5 mg via INTRAVENOUS

## 2017-08-31 MED ORDER — MIDAZOLAM HCL 2 MG/2ML IJ SOLN
INTRAMUSCULAR | Status: AC
  Filled 2017-08-31: qty 2

## 2017-08-31 MED ORDER — MORPHINE SULFATE (PF) 2 MG/ML IV SOLN
1.0000 mg | INTRAVENOUS | Status: DC | PRN
  Administered 2017-08-31 – 2017-09-04 (×5): 1 mg via INTRAVENOUS
  Filled 2017-08-31 (×5): qty 1

## 2017-08-31 MED ORDER — FENTANYL CITRATE (PF) 100 MCG/2ML IJ SOLN
INTRAMUSCULAR | Status: AC
  Filled 2017-08-31: qty 2

## 2017-08-31 SURGICAL SUPPLY — 59 items
APPLIER CLIP 5 13 M/L LIGAMAX5 (MISCELLANEOUS)
APPLIER CLIP ROT 10 11.4 M/L (STAPLE)
APR CLP MED LRG 11.4X10 (STAPLE)
BLADE EXTENDED COATED 6.5IN (ELECTRODE) IMPLANT
CABLE HIGH FREQUENCY MONO STRZ (ELECTRODE) ×3 IMPLANT
CELLS DAT CNTRL 66122 CELL SVR (MISCELLANEOUS) ×1 IMPLANT
CLIP APPLIE 5 13 M/L LIGAMAX5 (MISCELLANEOUS) IMPLANT
CLIP APPLIE ROT 10 11.4 M/L (STAPLE) IMPLANT
COVER SURGICAL LIGHT HANDLE (MISCELLANEOUS) ×3 IMPLANT
DECANTER SPIKE VIAL GLASS SM (MISCELLANEOUS) IMPLANT
DRSG OPSITE POSTOP 4X10 (GAUZE/BANDAGES/DRESSINGS) IMPLANT
DRSG OPSITE POSTOP 4X6 (GAUZE/BANDAGES/DRESSINGS) ×3 IMPLANT
DRSG OPSITE POSTOP 4X8 (GAUZE/BANDAGES/DRESSINGS) IMPLANT
ELECT REM PT RETURN 15FT ADLT (MISCELLANEOUS) ×3 IMPLANT
GAUZE SPONGE 2X2 8PLY STRL LF (GAUZE/BANDAGES/DRESSINGS) ×1 IMPLANT
GAUZE SPONGE 4X4 12PLY STRL (GAUZE/BANDAGES/DRESSINGS) IMPLANT
GLOVE BIOGEL M 8.0 STRL (GLOVE) ×6 IMPLANT
GOWN STRL REUS W/TWL XL LVL3 (GOWN DISPOSABLE) ×24 IMPLANT
HANDLE STAPLE EGIA 4 XL (STAPLE) ×3 IMPLANT
LEGGING LITHOTOMY PAIR STRL (DRAPES) IMPLANT
LIGASURE IMPACT 36 18CM CVD LR (INSTRUMENTS) ×3 IMPLANT
PACK COLON (CUSTOM PROCEDURE TRAY) ×3 IMPLANT
PAD POSITIONING PINK XL (MISCELLANEOUS) ×3 IMPLANT
PORT LAP GEL ALEXIS MED 5-9CM (MISCELLANEOUS) IMPLANT
POSITIONER SURGICAL ARM (MISCELLANEOUS) ×6 IMPLANT
RELOAD EGIA 60 MED/THCK PURPLE (STAPLE) ×6 IMPLANT
RELOAD EGIA 60 TAN VASC (STAPLE) ×3 IMPLANT
RTRCTR WOUND ALEXIS 18CM MED (MISCELLANEOUS) ×3
SCISSORS LAP 5X45 EPIX DISP (ENDOMECHANICALS) ×3 IMPLANT
SEALER TISSUE X1 CVD JAW (INSTRUMENTS) IMPLANT
SET IRRIG TUBING LAPAROSCOPIC (IRRIGATION / IRRIGATOR) ×3 IMPLANT
SHEARS CURVED HARMONIC AC 45CM (MISCELLANEOUS) ×3 IMPLANT
SLEEVE XCEL OPT CAN 5 100 (ENDOMECHANICALS) ×9 IMPLANT
SPONGE GAUZE 2X2 STER 10/PKG (GAUZE/BANDAGES/DRESSINGS) ×2
STAPLER VISISTAT 35W (STAPLE) ×3 IMPLANT
SUT CHROMIC 3 0 SH 27 (SUTURE) IMPLANT
SUT NOVA NAB DX-16 0-1 5-0 T12 (SUTURE) ×9 IMPLANT
SUT PDS AB 1 CTX 36 (SUTURE) IMPLANT
SUT PDS AB 1 TP1 96 (SUTURE) IMPLANT
SUT PDS AB 4-0 SH 27 (SUTURE) ×6 IMPLANT
SUT PROLENE 2 0 KS (SUTURE) IMPLANT
SUT SILK 2 0 (SUTURE) ×3
SUT SILK 2 0 SH CR/8 (SUTURE) ×3 IMPLANT
SUT SILK 2-0 18XBRD TIE 12 (SUTURE) ×1 IMPLANT
SUT SILK 3 0 (SUTURE) ×3
SUT SILK 3 0 SH CR/8 (SUTURE) ×6 IMPLANT
SUT SILK 3-0 18XBRD TIE 12 (SUTURE) ×1 IMPLANT
SUT VIC AB 4-0 SH 18 (SUTURE) IMPLANT
SYS LAPSCP GELPORT 120MM (MISCELLANEOUS)
SYSTEM LAPSCP GELPORT 120MM (MISCELLANEOUS) IMPLANT
TAPE CLOTH SURG 4X10 WHT LF (GAUZE/BANDAGES/DRESSINGS) ×3 IMPLANT
TOWEL OR NON WOVEN STRL DISP B (DISPOSABLE) ×3 IMPLANT
TRAY FOLEY W/METER SILVER 14FR (SET/KITS/TRAYS/PACK) IMPLANT
TRAY FOLEY W/METER SILVER 16FR (SET/KITS/TRAYS/PACK) IMPLANT
TROCAR BLADELESS OPT 5 100 (ENDOMECHANICALS) ×3 IMPLANT
TROCAR XCEL NON-BLD 11X100MML (ENDOMECHANICALS) IMPLANT
TUBING CONNECTING 10 (TUBING) IMPLANT
TUBING CONNECTING 10' (TUBING)
TUBING INSUF HEATED (TUBING) ×3 IMPLANT

## 2017-08-31 NOTE — Anesthesia Preprocedure Evaluation (Signed)
Anesthesia Evaluation  Patient identified by MRN, date of birth, ID band Patient awake    Reviewed: Allergy & Precautions, NPO status , Patient's Chart, lab work & pertinent test results  Airway Mallampati: II  TM Distance: >3 FB Neck ROM: Full    Dental no notable dental hx.    Pulmonary neg pulmonary ROS, former smoker,    Pulmonary exam normal breath sounds clear to auscultation       Cardiovascular hypertension, Normal cardiovascular exam Rhythm:Regular Rate:Normal     Neuro/Psych negative neurological ROS  negative psych ROS   GI/Hepatic Neg liver ROS, GERD  ,  Endo/Other  negative endocrine ROS  Renal/GU negative Renal ROS  negative genitourinary   Musculoskeletal negative musculoskeletal ROS (+)   Abdominal   Peds negative pediatric ROS (+)  Hematology negative hematology ROS (+)   Anesthesia Other Findings   Reproductive/Obstetrics negative OB ROS                             Anesthesia Physical Anesthesia Plan  ASA: II  Anesthesia Plan: General   Post-op Pain Management:    Induction: Intravenous  PONV Risk Score and Plan: 2 and Ondansetron, Dexamethasone and Treatment may vary due to age or medical condition  Airway Management Planned: Oral ETT  Additional Equipment:   Intra-op Plan:   Post-operative Plan: Extubation in OR  Informed Consent: I have reviewed the patients History and Physical, chart, labs and discussed the procedure including the risks, benefits and alternatives for the proposed anesthesia with the patient or authorized representative who has indicated his/her understanding and acceptance.     Dental advisory given  Plan Discussed with: CRNA and Surgeon  Anesthesia Plan Comments:         Anesthesia Quick Evaluation  

## 2017-08-31 NOTE — Anesthesia Postprocedure Evaluation (Signed)
Anesthesia Post Note  Patient: Omar [name].  Procedure(s) Performed: Procedure(s) (LRB): LAPAROSCOPIC ASSISTED  RIGHT HEMI COLECTOMY (Right)     Patient location during evaluation: PACU Anesthesia Type: General Level of consciousness: awake and alert Pain management: pain level controlled Vital Signs Assessment: post-procedure vital signs reviewed and stable Respiratory status: spontaneous breathing, nonlabored ventilation, respiratory function stable and patient connected to nasal cannula oxygen Cardiovascular status: blood pressure returned to baseline and stable Postop Assessment: no apparent nausea or vomiting Anesthetic complications: no    Last Vitals:  Vitals:   08/31/17 1245 08/31/17 1300  BP: (!) 138/92 (!) 131/96  Pulse: 65 64  Resp: 13 14  Temp:    SpO2: 100% 100%    Last Pain:  Vitals:   08/31/17 1230  TempSrc:   PainSc: Asleep                 Ashley Bultema S

## 2017-08-31 NOTE — Transfer of Care (Signed)
Immediate Anesthesia Transfer of Care Note  Patient: Omar [name].  Procedure(s) Performed: Procedure(s): LAPAROSCOPIC ASSISTED  RIGHT HEMI COLECTOMY (Right)  Patient Location: PACU  Anesthesia Type:General  Level of Consciousness: alert , sedated and patient cooperative  Airway & Oxygen Therapy: Patient connected to face mask oxygen  Post-op Assessment: Report given to RN and Post -op Vital signs reviewed and stable  Post vital signs: stable  Last Vitals:  Vitals:   08/31/17 0732 08/31/17 1203  BP: (!) 136/92 (!) 167/111  Pulse: 97 78  Resp: 18 11  Temp: 36.5 C 36.4 C  SpO2: 100% 100%    Last Pain:  Vitals:   08/31/17 0815  TempSrc:   PainSc: 0-No pain         Complications: No apparent anesthesia complications

## 2017-08-31 NOTE — Interval H&P Note (Signed)
History and Physical Interval Note:  08/31/2017 9:11 AM  Omar [name].  has presented today for surgery, with the diagnosis of cecal polyp  The various methods of treatment have been discussed with the patient and family. After consideration of risks, benefits and other options for treatment, the patient has consented to  Procedure(s): LAPAROSCOPIC ASSISTED  RIGHT HEMI COLECTOMY (Right) as a surgical intervention .  The patient's history has been reviewed, patient examined, no change in status, stable for surgery.  I have reviewed the patient's chart and labs.  Questions were answered to the patient's satisfaction.     Jef Futch B

## 2017-08-31 NOTE — Op Note (Signed)
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  After anesthesia was administered and the patient was prepped a timeout was performed.  Access to the abdomen was achieved with 5 mm Optiview through the left upper quadrant.  Following insion in the midline he had had a previous robotic prostatectomy and repair ofhernia defect with mesh. I took down the omental attachments with the Harmonic scalpel. I placed 2 incisions in a linear fashion from just abohe umbilicus to a few centimeters above to correspond about a 9 cm incision for subcutaneous extraction. I uses 35 mm ports to operate.  With the harmonscalpel I then mobilized the right colon and the hepatic flexure over beyond the the gallbladder. A mobilize the appendix and the terminal ileum. We then connected the trochars incisions in the midline creating that into one incision coming to the mesh again with the patient insufflated. Wound protector was placed and the specimen was extracted.  Terminal ileum was then divided with a endostapler 6 cm brown load The transverse colon was divided with a endostapler purple loadand the mesent was divided with the LigaSure. The mesentery was closed with interrupted 3-0 silk's and then we did a functional end-to-end anastomosisopening along the antimesenteric border of the small intestine along the tenia and inserted a purple load 6 cm Covidien stapler to create the anastomosis. Common defect was closed in 2 layers with 4-0 PDS on the inner layer and interrupted 3-0 silk's.  Fascia was closed intted #1 Novafils. Ureter was in reinflated we surveyed the surgery site area. We also irrigated with liter saline.  There was virtually no spillage. We injected the fascia with the 20 mL of Exparel diluted with half-strength percent or percent 10 mL of Marcaine plain.  Scope was withdrawn  And the skin was closed with a stapler.  The patient tolerated the procedure well and was taken to the PACU in stable condition.     Matt B. Hassell Done, [place], [facility], [place]

## 2017-08-31 NOTE — Anesthesia Procedure Notes (Addendum)
Procedure Name: Intubation Date/Time: 08/31/2017 9:36 AM Performed by: Pilar Grammes Pre-anesthesia Checklist: Patient identified, Emergency Drugs available, Suction available, Patient being monitored and Timeout performed Patient Re-evaluated:Patient Re-evaluated prior to induction Oxygen Delivery Method: Circle system utilized Preoxygenation: Pre-oxygenation with 100% oxygen Induction Type: IV induction Ventilation: Mask ventilation without difficulty Laryngoscope Size: Miller and 2 Grade View: Grade I Tube type: Oral Tube size: 7.5 mm Number of attempts: 1 Airway Equipment and Method: Stylet Placement Confirmation: positive ETCO2,  ETT inserted through vocal cords under direct vision,  CO2 detector and breath sounds checked- equal and bilateral Secured at: 24 cm Tube secured with: Tape Dental Injury: Teeth and Oropharynx as per pre-operative assessment

## 2017-09-01 ENCOUNTER — Encounter (HOSPITAL_COMMUNITY): Payer: Self-pay | Admitting: Surgery

## 2017-09-01 LAB — CBC
HCT: 39.6 % (ref 39.0–52.0)
HEMOGLOBIN: 13.4 g/dL (ref 13.0–17.0)
MCH: 26.9 pg (ref 26.0–34.0)
MCHC: 33.8 g/dL (ref 30.0–36.0)
MCV: 79.5 fL (ref 78.0–100.0)
Platelets: 206 10*3/uL (ref 150–400)
RBC: 4.98 MIL/uL (ref 4.22–5.81)
RDW: 12.9 % (ref 11.5–15.5)
WBC: 7.7 10*3/uL (ref 4.0–10.5)

## 2017-09-01 LAB — BASIC METABOLIC PANEL
ANION GAP: 8 (ref 5–15)
BUN: 11 mg/dL (ref 6–20)
CALCIUM: 8.9 mg/dL (ref 8.9–10.3)
CO2: 27 mmol/L (ref 22–32)
Chloride: 104 mmol/L (ref 101–111)
Creatinine, Ser: 1.14 mg/dL (ref 0.61–1.24)
Glucose, Bld: 126 mg/dL — ABNORMAL HIGH (ref 65–99)
Potassium: 4.2 mmol/L (ref 3.5–5.1)
Sodium: 139 mmol/L (ref 135–145)

## 2017-09-01 MED ORDER — ZOLPIDEM TARTRATE 5 MG PO TABS
5.0000 mg | ORAL_TABLET | Freq: Every evening | ORAL | Status: DC | PRN
  Administered 2017-09-01: 5 mg via ORAL
  Filled 2017-09-01: qty 1

## 2017-09-01 NOTE — Progress Notes (Signed)
Patient ID: Omar H Cormier Jr., male   DOB: 09/12/1946, 71 y.o.   MRN: [id number] [facility] Progress Note:   1 Day Post-Op  Subjective: Mental status is clear.  Doing well. Objective: Vital signs in last 24 hours: Temp:  [97.6 F (36.4 C)-98.6 F (37 C)] 98 F (36.7 C) (09/28 1010) Pulse Rate:  [64-92] 76 (09/28 1010) Resp:  [13-18] 18 (09/28 1010) BP: (129-155)/(88-100) 138/94 (09/28 1010) SpO2:  [98 %-100 %] 100 % (09/28 1010)  Intake/Output from previous day: 09/27 0701 - 09/28 0700 In: 2275 [I.V.:2275] Out: 1700 [Urine:1650; Blood:50] Intake/Output this shift: Total I/O In: 0  Out: 300 [Urine:300]  Physical Exam: Work of breathing is not labored.  Minimal incisional pain  Lab Results:  Results for orders placed or performed during the hospital encounter of 08/31/17 (from the past 48 hour(s))  Hemoglobin A1c     Status: None   Collection Time: 08/31/17  7:59 AM  Result Value Ref Range   Hgb A1c MFr Bld 5.5 4.8 - 5.6 %    Comment: (NOTE) Pre diabetes:          5.7%-6.4% Diabetes:              >6.4% Glycemic control for   <7.0% adults with diabetes    Mean Plasma Glucose 111.15 mg/dL    Comment: Performed at  Hospital Lab, [address] [place], [place] [address]  CBC     Status: None   Collection Time: 08/31/17  3:52 PM  Result Value Ref Range   WBC 8.5 4.0 - 10.5 K/uL   RBC 5.36 4.22 - 5.81 MIL/uL   Hemoglobin 14.8 13.0 - 17.0 g/dL   HCT 44.0 39.0 - 52.0 %   MCV 82.1 78.0 - 100.0 fL   MCH 27.6 26.0 - 34.0 pg   MCHC 33.6 30.0 - 36.0 g/dL   RDW 13.1 11.5 - 15.5 %   Platelets 195 150 - 400 K/uL  Creatinine, serum     Status: None   Collection Time: 08/31/17  3:52 PM  Result Value Ref Range   Creatinine, Ser 1.15 0.61 - 1.24 mg/dL   GFR calc non Af Amer >60 >60 mL/min   GFR calc Af Amer >60 >60 mL/min    Comment: (NOTE) The eGFR has been calculated using the CKD EPI equation. This calculation has not been validated in all clinical  situations. eGFR's persistently <60 mL/min signify possible Chronic Kidney Disease.   Basic metabolic panel     Status: Abnormal   Collection Time: 09/01/17  5:16 AM  Result Value Ref Range   Sodium 139 135 - 145 mmol/L   Potassium 4.2 3.5 - 5.1 mmol/L   Chloride 104 101 - 111 mmol/L   CO2 27 22 - 32 mmol/L   Glucose, Bld 126 (H) 65 - 99 mg/dL   BUN 11 6 - 20 mg/dL   Creatinine, Ser 1.14 0.61 - 1.24 mg/dL   Calcium 8.9 8.9 - 10.3 mg/dL   GFR calc non Af Amer >60 >60 mL/min   GFR calc Af Amer >60 >60 mL/min    Comment: (NOTE) The eGFR has been calculated using the CKD EPI equation. This calculation has not been validated in all clinical situations. eGFR's persistently <60 mL/min signify possible Chronic Kidney Disease.    Anion gap 8 5 - 15  CBC     Status: None   Collection Time: 09/01/17  5:16 AM  Result Value Ref Range     WBC 7.7 4.0 - 10.5 K/uL   RBC 4.98 4.22 - 5.81 MIL/uL   Hemoglobin 13.4 13.0 - 17.0 g/dL   HCT 39.6 39.0 - 52.0 %   MCV 79.5 78.0 - 100.0 fL   MCH 26.9 26.0 - 34.0 pg   MCHC 33.8 30.0 - 36.0 g/dL   RDW 12.9 11.5 - 15.5 %   Platelets 206 150 - 400 K/uL    Radiology/Results: No results found.  Anti-infectives: Anti-infectives    Start     Dose/Rate Route Frequency Ordered Stop   08/31/17 2200  cefoTEtan (CEFOTAN) 2 g in dextrose 5 % 50 mL IVPB     2 g 100 mL/hr over 30 Minutes Intravenous Every 12 hours 08/31/17 1535 08/31/17 2230   08/31/17 0733  cefoTEtan in Dextrose 5% (CEFOTAN) IVPB 2 g     2 g Intravenous On call to O.R. 08/31/17 0733 08/31/17 0940      Assessment/Plan: Problem List: Patient Active Problem List   Diagnosis Date Noted  . S/P right colectomy 08/31/2017  . Itching 07/28/2017  . Neuropathy 11/21/2016  . Wellness examination 09/13/2016  . 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 Dizziness and giddiness 02/08/2016  . Chest pain 02/08/2016  . Cold intolerance 11/15/2015  . DOE (dyspnea on exertion)  07/18/2014  . History of pulmonary embolism 07/18/2014  . Numbness and tingling of right arm 07/17/2014  . RLQ abdominal pain 07/04/2014  . Pain in right testicle 07/04/2014  . 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 Coronary atherosclerosis of native coronary artery 03/18/2014  . Encounter for therapeutic drug monitoring 01/07/2014  . Acute pulmonary embolism, main risk factor [place] car rides 11/13/2013  . Hyperlipidemia 11/13/2013  . 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 Umbilical hernia 04/08/2013  . Prostate cancer (HCC) 11/09/2012  . Stricture and stenosis of esophagus 05/31/2012  . Encounter for Cesaro-term (current) use of other medications 02/29/2012  . BENIGN POSITIONAL VERTIGO 08/17/2009  . LEUKOPENIA, MILD 05/27/2009  . Esophageal reflux 10/09/2007  . Dyslipidemia 07/16/2007  . Depression 07/16/2007  . COLONIC POLYPS, HX OF 07/16/2007    Doing well.  Will offer clear liquids.   1 Day Post-Op    LOS: 1 day   Matt B. , MD, FACS  [facility] [phone] beeper [phone]  09/01/2017 12:22 PM 

## 2017-09-01 NOTE — Progress Notes (Signed)
Nutrition Brief Note  Patient identified on the Malnutrition Screening Tool (MST) Report  Wt Readings from Last 15 Encounters:  08/31/17 199 lb (90.3 kg)  08/23/17 199 lb 3.2 oz (90.4 kg)  07/26/17 198 lb 12.8 oz (90.2 kg)  07/12/17 201 lb (91.2 kg)  06/28/17 201 lb 12.8 oz (91.5 kg)  02/02/17 204 lb (92.5 kg)  01/19/17 207 lb 9.6 oz (94.2 kg)  01/06/17 207 lb (93.9 kg)  11/21/16 207 lb 4 oz (94 kg)  09/13/16 206 lb (93.4 kg)  08/16/16 208 lb (94.3 kg)  02/08/16 209 lb (94.8 kg)  11/13/15 208 lb (94.3 kg)  08/26/15 205 lb (93 kg)  04/07/15 205 lb 3.2 oz (93.1 kg)    Body mass index is 24.54 kg/m. Patient meets criteria for normal weight for height based on current BMI. Pt reports no significant weight loss. Per record, pt's weight has remained fairly stable over the past couple years. Pt reports a UBW of 205 lbs.   Current diet order was advanced to clear liquids at 1225 09/01/17. Pt reports good PO intake PTA consuming a fast food breakfast sandwich for breakfast, a big meal for early dinner and possibly cereal as a late night meal. Pt also reports consuming snacks throughout the day such as cracker and Vienna sausages.   Labs and medications reviewed.   No nutrition interventions warranted at this time. If nutrition issues arise, please consult RD.   [place] Ranger, MS, RDN, LDN 09/01/2017 12:31 PM

## 2017-09-02 DIAGNOSIS — K635 Polyp of colon: Secondary | ICD-10-CM | POA: Diagnosis present

## 2017-09-02 DIAGNOSIS — D122 Benign neoplasm of ascending colon: Secondary | ICD-10-CM

## 2017-09-02 MED ORDER — AMLODIPINE BESYLATE 5 MG PO TABS
5.0000 mg | ORAL_TABLET | Freq: Every day | ORAL | Status: DC
  Administered 2017-09-02 – 2017-09-04 (×3): 5 mg via ORAL
  Filled 2017-09-02 (×3): qty 1

## 2017-09-02 MED ORDER — LINACLOTIDE 145 MCG PO CAPS
145.0000 ug | ORAL_CAPSULE | Freq: Every day | ORAL | Status: DC
  Administered 2017-09-02 – 2017-09-04 (×2): 145 ug via ORAL
  Filled 2017-09-02 (×3): qty 1

## 2017-09-02 NOTE — Progress Notes (Signed)
Patient ID: Omar [name]., male   DOB: [birth year], 71 y.o.   MRN: [id number] [facility] Progress Note:   2 Days Post-Op  Subjective: Mental status is clear.  Up walking about with wife Objective: Vital signs in last 24 hours: Temp:  [98 F (36.7 C)-98.5 F (36.9 C)] 98.5 F (36.9 C) (09/29 0640) Pulse Rate:  [71-80] 71 (09/29 0640) Resp:  [16-18] 16 (09/29 0640) BP: (136-157)/(94-95) 136/94 (09/29 0640) SpO2:  [95 %-100 %] 95 % (09/29 0640)  Intake/Output from previous day: 09/28 0701 - 09/29 0700 In: 4265 [P.O.:2000; I.V.:2265] Out: 4550 [Urine:4550] Intake/Output this shift: No intake/output data recorded.  Physical Exam: Work of breathing is normal.  Incision OK  Lab Results:  Results for orders placed or performed during the hospital encounter of 08/31/17 (from the past 48 hour(s))  CBC     Status: None   Collection Time: 08/31/17  3:52 PM  Result Value Ref Range   WBC 8.5 4.0 - 10.5 K/uL   RBC 5.36 4.22 - 5.81 MIL/uL   Hemoglobin 14.8 13.0 - 17.0 g/dL   HCT 16.5 08.1 - 36.5 %   MCV 82.1 78.0 - 100.0 fL   MCH 27.6 26.0 - 34.0 pg   MCHC 33.6 30.0 - 36.0 g/dL   RDW 31.1 39.4 - 91.4 %   Platelets 195 150 - 400 K/uL  Creatinine, serum     Status: None   Collection Time: 08/31/17  3:52 PM  Result Value Ref Range   Creatinine, Ser 1.15 0.61 - 1.24 mg/dL   GFR calc non Af Amer >60 >60 mL/min   GFR calc Af Amer >60 >60 mL/min    Comment: (NOTE) The eGFR has been calculated using the CKD EPI equation. This calculation has not been validated in all clinical situations. eGFR's persistently <60 mL/min signify possible Chronic Kidney Disease.   Basic metabolic panel     Status: Abnormal   Collection Time: 09/01/17  5:16 AM  Result Value Ref Range   Sodium 139 135 - 145 mmol/L   Potassium 4.2 3.5 - 5.1 mmol/L   Chloride 104 101 - 111 mmol/L   CO2 27 22 - 32 mmol/L   Glucose, Bld 126 (H) 65 - 99 mg/dL   BUN 11 6 - 20 mg/dL   Creatinine, Ser 8.65 0.61 -  1.24 mg/dL   Calcium 8.9 8.9 - 46.8 mg/dL   GFR calc non Af Amer >60 >60 mL/min   GFR calc Af Amer >60 >60 mL/min    Comment: (NOTE) The eGFR has been calculated using the CKD EPI equation. This calculation has not been validated in all clinical situations. eGFR's persistently <60 mL/min signify possible Chronic Kidney Disease.    Anion gap 8 5 - 15  CBC     Status: None   Collection Time: 09/01/17  5:16 AM  Result Value Ref Range   WBC 7.7 4.0 - 10.5 K/uL   RBC 4.98 4.22 - 5.81 MIL/uL   Hemoglobin 13.4 13.0 - 17.0 g/dL   HCT 98.3 89.2 - 81.6 %   MCV 79.5 78.0 - 100.0 fL   MCH 26.9 26.0 - 34.0 pg   MCHC 33.8 30.0 - 36.0 g/dL   RDW 64.1 83.0 - 43.0 %   Platelets 206 150 - 400 K/uL    Radiology/Results: No results found.  Anti-infectives: Anti-infectives    Start     Dose/Rate Route Frequency Ordered Stop   08/31/17 2200  cefoTEtan (CEFOTAN) 2 g  in dextrose 5 % 50 mL IVPB     2 g 100 mL/hr over 30 Minutes Intravenous Every 12 hours 08/31/17 1535 08/31/17 2230   08/31/17 0733  cefoTEtan in Dextrose 5% (CEFOTAN) IVPB 2 g     2 g Intravenous On call to O.R. 08/31/17 0733 08/31/17 0940      Assessment/Plan: Problem List: Patient Active Problem List   Diagnosis Date Noted  . 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 S/P right colectomy 08/31/2017  . Itching 07/28/2017  . Neuropathy 11/21/2016  . Wellness examination 09/13/2016  . 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 Dizziness and giddiness 02/08/2016  . Chest pain 02/08/2016  . Cold intolerance 11/15/2015  . DOE (dyspnea on exertion) 07/18/2014  . History of pulmonary embolism 07/18/2014  . Numbness and tingling of right arm 07/17/2014  . RLQ abdominal pain 07/04/2014  . Pain in right testicle 07/04/2014  . 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 Coronary atherosclerosis of native coronary artery 03/18/2014  . Encounter for therapeutic drug monitoring 01/07/2014  .  Acute pulmonary embolism, main risk factor [place] car rides 11/13/2013  . Hyperlipidemia 11/13/2013  . 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 Umbilical hernia 54/36/0677  . Prostate cancer ([place]) 11/09/2012  . Stricture and stenosis of esophagus 05/31/2012  . Encounter for Placzek-term (current) use of other medications 02/29/2012  . BENIGN POSITIONAL VERTIGO 08/17/2009  . LEUKOPENIA, MILD 05/27/2009  . Esophageal reflux 10/09/2007  . Dyslipidemia 07/16/2007  . Depression 07/16/2007  . COLONIC POLYPS, HX OF 07/16/2007    Flatus but not bm.  Will add Linzess and advance to full liquid. 2 Days Post-Op    LOS: 2 days   Matt B. Hassell Done, MD, [facility] [phone] beeper [phone]  09/02/2017 10:05 AM

## 2017-09-03 NOTE — Progress Notes (Signed)
Patient ID: Omar [name]., male   DOB: Oct 04, 1946, 71 y.o.   MRN: [id number] [facility] Progress Note:   3 Days Post-Op  Subjective: Mental status is good.  No complaints Objective: Vital signs in last 24 hours: Temp:  [97.9 F (36.6 C)-98.5 F (36.9 C)] 98.3 F (36.8 C) (09/30 0550) Pulse Rate:  [76-78] 76 (09/30 0550) Resp:  [16-18] 18 (09/30 0550) BP: (144-159)/(96-102) 144/101 (09/30 0550) SpO2:  [99 %-100 %] 99 % (09/30 0550)  Intake/Output from previous day: 09/29 0701 - 09/30 0700 In: 1635 [P.O.:840; I.V.:795] Out: 600 [Urine:600] Intake/Output this shift: Total I/O In: 120 [P.O.:120] Out: -   Physical Exam: Work of breathing is normal.  Up shaving  Lab Results:  No results found for this or any previous visit (from the past 48 hour(s)).  Radiology/Results: No results found.  Anti-infectives: Anti-infectives    Start     Dose/Rate Route Frequency Ordered Stop   08/31/17 2200  cefoTEtan (CEFOTAN) 2 g in dextrose 5 % 50 mL IVPB     2 g 100 mL/hr over 30 Minutes Intravenous Every 12 hours 08/31/17 1535 08/31/17 2230   08/31/17 0733  cefoTEtan in Dextrose 5% (CEFOTAN) IVPB 2 g     2 g Intravenous On call to O.R. 08/31/17 0733 08/31/17 0940      Assessment/Plan: Problem List: Patient Active Problem List   Diagnosis Date Noted  . 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 S/P right colectomy 08/31/2017  . Itching 07/28/2017  . Neuropathy 11/21/2016  . Wellness examination 09/13/2016  . 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 Dizziness and giddiness 02/08/2016  . Chest pain 02/08/2016  . Cold intolerance 11/15/2015  . DOE (dyspnea on exertion) 07/18/2014  . History of pulmonary embolism 07/18/2014  . Numbness and tingling of right arm 07/17/2014  . RLQ abdominal pain 07/04/2014  . Pain in right testicle 07/04/2014  . 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 Coronary atherosclerosis of native  coronary artery 03/18/2014  . Encounter for therapeutic drug monitoring 01/07/2014  . Acute pulmonary embolism, main risk factor [place] car rides 11/13/2013  . Hyperlipidemia 11/13/2013  . 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 Umbilical hernia 40/98/1191  . Prostate cancer ([place]) 11/09/2012  . Stricture and stenosis of esophagus 05/31/2012  . Encounter for [place]-term (current) use of other medications 02/29/2012  . BENIGN POSITIONAL VERTIGO 08/17/2009  . LEUKOPENIA, MILD 05/27/2009  . Esophageal reflux 10/09/2007  . Dyslipidemia 07/16/2007  . Depression 07/16/2007  . COLONIC POLYPS, HX OF 07/16/2007    Will advance to regular diet and hopeful discharge tomorrow.   3 Days Post-Op    LOS: 3 days   Matt B. Hassell Done, MD, [facility] [phone] beeper [phone]  09/03/2017 10:16 AM

## 2017-09-04 MED ORDER — HYDROCODONE-ACETAMINOPHEN 5-325 MG PO TABS: 1.0000 | ORAL_TABLET | ORAL | 0 refills | Status: DC | PRN

## 2017-09-04 MED ORDER — ZOLPIDEM TARTRATE 5 MG PO TABS: 5.0000 mg | ORAL_TABLET | Freq: Every evening | ORAL | 0 refills | Status: DC | PRN

## 2017-09-04 MED ORDER — ONDANSETRON HCL 4 MG PO TABS
4.0000 mg | ORAL_TABLET | Freq: Four times a day (QID) | ORAL | 0 refills | Status: DC | PRN
Start: 2017-09-04 — End: 2018-10-15

## 2017-09-04 MED ORDER — LINACLOTIDE 145 MCG PO CAPS: 145.0000 ug | ORAL_CAPSULE | Freq: Every day | ORAL | 0 refills | Status: DC

## 2017-09-04 NOTE — Discharge Instructions (Signed)
Open Colectomy, Care After °This sheet gives you information about how to care for yourself after your procedure. Your health care provider may also give you more specific instructions. If you have problems or questions, contact your health care provider. °What can I expect after the procedure? °After the procedure, it is common to have: °· Pain in your abdomen, especially along your incision. °· Tiredness. Your energy level will return to normal over the next several weeks. °· Constipation. °· Nausea. °· Difficulty urinating. ° °Follow these instructions at home: °Activity °· You may be able to return to most of your normal activities within 1-2 weeks, such as working, walking up stairs, and sexual activity. °· Avoid activities that require a lot of energy for 4-6 weeks after surgery, such as running, climbing, and lifting heavy objects. Ask your health care provider what activities are safe for you. °· Take rest breaks during the day as needed. °· Do not drive for 1-2 weeks or until your health care provider says that it is safe. °· Do not drive or use heavy machinery while taking prescription pain medicines. °· Do not lift anything that is heavier than 10 lb (4.3 kg) until your health care provider says that it is safe. °Incision care °· Follow instructions from your health care provider about how to take care of your incision. Make sure you: °? Wash your hands with soap and water before you change your bandage (dressing). If soap and water are not available, use hand sanitizer. °? Change your dressing as told by your health care provider. °? Leave stitches (sutures) or staples in place. These skin closures may need to stay in place for 2 weeks or longer. °· Avoid wearing tight clothing around your incision. °· Protect your incision area from the sun. °· Check your incision area every day for signs of infection. Check for: °? More redness, swelling, or pain. °? More fluid or blood. °? Warmth. °? Pus or a bad  smell. °General instructions °· Do not take baths, swim, or use a hot tub until your health care provider approves. Ask your health care provider when you may shower. °· Take over-the-counter and prescription medicines, including stool softeners, only as told by your health care provider. °· Eat a low-fat and low-fiber diet for the first 4 weeks after surgery. °· Keep all follow-up visits as told by your health care provider. This is important. °Contact a health care provider if: °· You have more redness, swelling, or pain around your incision. °· You have more fluid or blood coming from your incision. °· Your incision feels warm to the touch. °· You have pus or a bad smell coming from your incision. °· You have a fever or chills. °· You do not have a bowel movement 2-3 days after surgery. °· You cannot eat or drink for 24 hours or more. °· You have persistent nausea and vomiting. °· You have abdominal pain that gets worse and does not get better with medicine. °Get help right away if: °· You have chest pain. °· You have shortness of breath. °· You have pain or swelling in your legs. °· Your incision breaks open after your sutures or staples have been removed. °· You have bleeding from the rectum. °This information is not intended to replace advice given to you by your health care provider. Make sure you discuss any questions you have with your health care provider. °Document Released: 06/14/2011 Document Revised: 08/22/2016 Document Reviewed: 08/22/2016 °Elsevier Interactive Patient   Education © 2018 Else[employer] Inc. ° °

## 2017-09-04 NOTE — Progress Notes (Signed)
Discharge instructions discussed with patient until no further questions ask. Able to answer questions about when to call MD.

## 2017-09-04 NOTE — Discharge Summary (Signed)
Physician Discharge Summary  Patient ID: Omar [name]. MRN: [id number] DOB/AGE: 1946/03/13 71 y.o.  Admit date: 08/31/2017 Discharge date: 09/04/2017  Admission Diagnoses:  Dysplastic polyp of the cecum  Discharge Diagnoses:   Colon, segmental resection for tumor, right - TUBULOVILLOUS ADENOMA WITH FOCAL HIGH GRADE DYSPLASIA. - TUBULAR ADENOMA. - TWENTY BENIGN LYMPH NODES. - BENIGN APPENDIX. - RESECTION MARGINS ARE NEGATIVE FOR DYSPLASIA. Principal Problem:   Dysplastic mass of the cecum s/p lap right colectomy 08/31/2017 Active Problems:   S/P right colectomy   Surgery:  See above  Discharged Condition: improved  Hospital Course:   Had lap assisted right hemicolectomy and was started on liquids.  Advanced well and was ready for discharge on PD 4.  Incisions looked good  Consults: none  Significant Diagnostic Studies: path report above    Discharge Exam: Blood pressure 132/90, pulse 76, temperature 98.9 F (37.2 C), temperature source Oral, resp. rate 18, height 6' 3.5" (1.918 m), weight 90.3 kg (199 lb), SpO2 100 %. Incision healing well with no redness and staples in place  Disposition: 01-Home or Self Care  Discharge Instructions    Call MD for:  persistant nausea and vomiting    Complete by:  As directed    Call MD for:  severe uncontrolled pain    Complete by:  As directed    Diet - low sodium heart healthy    Complete by:  As directed    Discharge instructions    Complete by:  As directed    Call office and set up staple removal with nurse at [facility] on this Friday.  [phone]   Increase activity slowly    Complete by:  As directed      Allergies as of 09/04/2017      Reactions   Sulfonamide Derivatives Other (See Comments)    blisters on skin      Medication List    TAKE these medications   amLODipine 5 MG tablet Commonly known as:  NORVASC Take 1 tablet (5 mg total) by mouth daily. What changed:  when to take this   aspirin 81 MG tablet Take  81 mg by mouth daily.   HYDROcodone-acetaminophen 5-325 MG tablet Commonly known as:  NORCO Take 1 tablet by mouth every 4 (four) hours as needed for moderate pain.   Lactulose 20 GM/30ML Soln Take 30 mLs (20 g total) by mouth 3 (three) times daily.   linaclotide 145 MCG Caps capsule Commonly known as:  LINZESS Take 1 capsule (145 mcg total) by mouth daily before breakfast.   ondansetron 4 MG tablet Commonly known as:  ZOFRAN Take 1 tablet (4 mg total) by mouth every 6 (six) hours as needed for nausea.   pantoprazole 40 MG tablet Commonly known as:  PROTONIX Take 1 tablet (40 mg total) by mouth daily. What changed:  when to take this   pravastatin 40 MG tablet Commonly known as:  PRAVACHOL Take 1 tablet (40 mg total) by mouth every evening.   PRESCRIPTION MEDICATION Inject 0.25-0.4 mLs into the skin daily as needed (erectile dysfunction). Penile injection compounded at  [facility]. Trimixture of Prostaglandin E-1, Papaverine, and Phentolamine.   zolpidem 5 MG tablet Commonly known as:  AMBIEN Take 1 tablet (5 mg total) by mouth at bedtime as needed for sleep.      Follow-up Information    Johnathan Hausen, MD Follow up in 4 week(s).   Specialty:  General Surgery Contact information: [address] [place] [address] [phone]           Signed: Pedro Earls 09/04/2017, 2:28 PM

## 2017-09-13 ENCOUNTER — Encounter: Payer: Self-pay | Admitting: Endocrinology

## 2017-09-13 ENCOUNTER — Ambulatory Visit (INDEPENDENT_AMBULATORY_CARE_PROVIDER_SITE_OTHER): Payer: Medicare Other | Admitting: Endocrinology

## 2017-09-13 VITALS — BP 128/78 | HR 76 | Wt 191.4 lb

## 2017-09-13 DIAGNOSIS — Z0000 Encounter for general adult medical examination without abnormal findings: Secondary | ICD-10-CM

## 2017-09-13 DIAGNOSIS — F329 Major depressive disorder, single episode, unspecified: Secondary | ICD-10-CM

## 2017-09-13 DIAGNOSIS — K5900 Constipation, unspecified: Secondary | ICD-10-CM | POA: Diagnosis not present

## 2017-09-13 DIAGNOSIS — I10 Essential (primary) hypertension: Secondary | ICD-10-CM

## 2017-09-13 DIAGNOSIS — E7800 Pure hypercholesterolemia, unspecified: Secondary | ICD-10-CM

## 2017-09-13 DIAGNOSIS — G4700 Insomnia, unspecified: Secondary | ICD-10-CM | POA: Diagnosis not present

## 2017-09-13 DIAGNOSIS — Z125 Encounter for screening for malignant neoplasm of prostate: Secondary | ICD-10-CM | POA: Diagnosis not present

## 2017-09-13 DIAGNOSIS — F32A Depression, unspecified: Secondary | ICD-10-CM

## 2017-09-13 LAB — LIPID PANEL
CHOL/HDL RATIO: 4
Cholesterol: 172 mg/dL (ref 0–200)
HDL: 46.4 mg/dL (ref >39.00)
LDL Cholesterol: 105 mg/dL — ABNORMAL HIGH (ref 0–99)
NONHDL: 125.66
Triglycerides: 104 mg/dL (ref 0.0–149.0)
VLDL: 20.8 mg/dL (ref 0.0–40.0)

## 2017-09-13 LAB — TSH: TSH: 1.03 u[IU]/mL (ref 0.35–4.50)

## 2017-09-13 LAB — PSA, MEDICARE: PSA: 0 ng/ml — ABNORMAL LOW (ref 0.10–4.00)

## 2017-09-13 MED ORDER — ZOLPIDEM TARTRATE 5 MG PO TABS: 2.5000 mg | ORAL_TABLET | Freq: Every evening | ORAL | 0 refills | Status: DC | PRN

## 2017-09-13 NOTE — Progress Notes (Signed)
we discussed code status.  pt requests full code, but would not want to be started or maintained on artificial life-support measures if there was not a reasonable chance of recovery 

## 2017-09-13 NOTE — Patient Instructions (Addendum)
Here is a prescription for ambien, to take 1/2 pill at night, as needed for sleep.  See if this helps. blood tests are requested for you today.  We'll let you know about the results. good diet and exercise significantly improve your health.  please let me know if you wish to be referred to a dietician.  high blood sugar is very risky to your health.  you should see an eye doctor and dentist every year.  It is very important to get all recommended vaccinations.   Please consider these measures for your health:  minimize alcohol.  Do not use tobacco products.  Have a colonoscopy at least every 10 years from age 51.  Keep firearms safely stored.  Always use seat belts.  have working smoke alarms in your home.  See an eye doctor and dentist regularly.  Never drive under the influence of alcohol or drugs (including prescription drugs).  Those with fair skin should take precautions against the sun, and should carefully examine their skin once per month, for any new or changed moles. It is critically important to prevent falling down (keep floor areas well-lit, dry, and free of loose objects.  If you have a cane, walker, or wheelchair, you should use it, even for short trips around the house.  Wear flat-soled shoes.  Also, try not to rush).  Please come back for a follow-up appointment in 1 year.

## 2017-09-13 NOTE — Progress Notes (Signed)
Subjective:    Patient ID: Omar [name]., male    DOB: 02-22-1946, 71 y.o.   MRN: [id number]  HPI  The state of at least three ongoing medical problems is addressed today, with interval history of each noted here: Insomnia: he does not take [place], due to insurance.  The sxs persist.  This is a stable problem. HTN: he denies sob. This is a stable problem. Constipation: this persists despite recent surgery.  This is a stable problem. Past Medical History:  Diagnosis Date  . Anxiety   . BPH (benign prostatic hypertrophy)   . Decreased white blood cell count   . Depression   . Diverticulosis   . Erectile dysfunction   . Gastric polyps   . GERD (gastroesophageal reflux disease)   . History of colonic polyps   . History of esophageal stricture 10/31/2007  . History of kidney stones   . History of rheumatic fever   . Hyperlipidemia   . Hypertension   . Prostate cancer ([place]) 11/09/2012  . Prostatism     Past Surgical History:  Procedure Laterality Date  . CARPAL TUNNEL RELEASE     Bilateral  . ESOPHAGEAL DILATION     last dilation 8'12  . INGUINAL HERNIA REPAIR    . INSERTION OF MESH N/A 04/15/2013   Procedure: INSERTION OF MESH;  Surgeon: Odis Hollingshead, MD;  Location: [facility];  Service: General;  Laterality: N/A;  . KNEE ARTHROSCOPY     left  . LAPAROSCOPIC RIGHT HEMI COLECTOMY Right 08/31/2017   Procedure: LAPAROSCOPIC ASSISTED  RIGHT HEMI COLECTOMY;  Surgeon: Johnathan Hausen, MD;  Location: [facility];  Service: General;  Laterality: Right;  . LEFT HEART CATHETERIZATION WITH CORONARY ANGIOGRAM N/A 07/18/2014   Procedure: LEFT HEART CATHETERIZATION WITH CORONARY ANGIOGRAM;  Surgeon: Burnell Blanks, MD;  Location: [facility] C[facility] LAB;  Service: Cardiovascular;  Laterality: N/A;  . ROBOT ASSISTED LAPAROSCOPIC RADICAL PROSTATECTOMY  11/08/2012   Procedure: ROBOTIC ASSISTED LAPAROSCOPIC RADICAL PROSTATECTOMY LEVEL 1;  Surgeon: Molli Hazard, MD;  Location: [facility];  Service:  Urology;  Laterality: N/A;     . TONSILLECTOMY    . UMBILICAL HERNIA REPAIR N/A 04/15/2013   Procedure: HERNIA REPAIR UMBILICAL ADULT;  Surgeon: Odis Hollingshead, MD;  Location: [facility];  Service: General;  Laterality: N/A;    Social History   Social History  . Marital status: Married    Spouse name: N/A  . Number of children: 4  . Years of education: N/A   Occupational History  . pastor [employer]   Social History Main Topics  . Smoking status: Former Smoker    Packs/day: 1.00    Years: 25.00    Types: Cigarettes    Quit date: 12/05/1993  . Smokeless tobacco: Never Used  . Alcohol use No  . Drug use: No  . Sexual activity: Yes   Other Topics Concern  . Not on file   Social History Narrative   Lives with wife in a one story home.  Has 4 children.  Works as a Theme park manager.     Education: 10th grade.     Current Outpatient Prescriptions on File Prior to Visit  Medication Sig Dispense Refill  . amLODipine (NORVASC) 5 MG tablet Take 1 tablet (5 mg total) by mouth daily. (Patient taking differently: Take 5 mg by mouth at bedtime. ) 90 tablet 3  . aspirin 81 MG tablet Take 81 mg by mouth daily.    Marland Kitchen  HYDROcodone-acetaminophen (NORCO) 5-325 MG tablet Take 1 tablet by mouth every 4 (four) hours as needed for moderate pain. 30 tablet 0  . Lactulose 20 GM/30ML SOLN Take 30 mLs (20 g total) by mouth 3 (three) times daily. 1892 mL 11  . linaclotide (LINZESS) 145 MCG CAPS capsule Take 1 capsule (145 mcg total) by mouth daily before breakfast. 30 capsule 0  . ondansetron (ZOFRAN) 4 MG tablet Take 1 tablet (4 mg total) by mouth every 6 (six) hours as needed for nausea. 20 tablet 0  . pantoprazole (PROTONIX) 40 MG tablet Take 1 tablet (40 mg total) by mouth daily. (Patient taking differently: Take 40 mg by mouth daily before breakfast. ) 90 tablet 1  . pravastatin (PRAVACHOL) 40 MG tablet Take 1 tablet (40 mg total) by mouth every evening. 90 tablet 3  . PRESCRIPTION MEDICATION  Inject 0.25-0.4 mLs into the skin daily as needed (erectile dysfunction). Penile injection compounded at  [place]. Trimixture of Prostaglandin E-1, Papaverine, and Phentolamine.    . [DISCONTINUED] vardenafil (LEVITRA) 20 MG tablet Take 20 mg by mouth as needed.       No current facility-administered medications on file prior to visit.     Allergies  Allergen Reactions  . Sulfonamide Derivatives Other (See Comments)     blisters on skin    Family History  Problem Relation Age of Onset  . Ovarian cancer Mother   . Cancer Mother   . Colon cancer Mother   . Stroke Father   . Lung cancer Maternal Uncle   . Lung cancer Maternal Aunt   . Esophageal cancer Neg Hx   . Stomach cancer Neg Hx   . Rectal cancer Neg Hx     BP 128/78   Pulse 76   Wt 191 lb 6.4 oz (86.8 kg)   SpO2 97%   BMI 23.61 kg/m   Review of Systems Denies BRBPR.   Depression is much better.     Objective:   Physical Exam VITAL SIGNS:  See vs page GENERAL: no distress NECK: There is no palpable thyroid enlargement.  No thyroid nodule is palpable.  No palpable lymphadenopathy at the anterior neck. LUNGS:  Clear to auscultation HEART:  Regular rate and rhythm without murmurs noted. Normal S1,S2.       Assessment & Plan:  Insomnia: I have sent a prescription to your pharmacy, to try tsaking [place] without insurance HTN: well-controlled.  Please continue the same medication Constipation: I told pt if he cannot afford linzess, to take miralax.    Subjective:   Patient here for Medicare annual wellness visit and management of other chronic and acute problems.     Risk factors: advanced age    76 of Physicians Providing Medical Care to Patient:  See "snapshot"   Activities of Daily Living: In your present state of health, do you have any difficulty performing the following activities (lives with wife)?:  Preparing food and eating?: No  Bathing yourself: No  Getting dressed: No  Using  the toilet:No  Moving around from place to place: No  In the past year have you fallen or had a near fall?:No    Home Safety: Has smoke detector and wears seat belts. firearms are safely stored  Opioid Use: none   Diet and Exercise  Current exercise habits: pt says good Dietary issues discussed: pt reports a healthy diet   Depression Screen  Q1: Over the past two weeks, have you felt down, depressed or hopeless?  no  Q2: Over the past two weeks, have you felt little interest or pleasure in doing things? no   The following portions of the patient's history were reviewed and updated as appropriate: allergies, current medications, past family history, past medical history, past social history, past surgical history and problem list.   Review of Systems  Denies hearing loss, and visual loss Objective:   Vision:  Advertising account executive, so he declines VA today.  Hearing: grossly normal Body mass index:  See vs page Msk: pt easily and quickly performs "get-up-and-go" from a sitting position Cognitive Impairment Assessment: cognition, memory and judgment appear normal.  remembers 2/3 at 5 minutes (? effort).  excellent recall.  can easily read and write a sentence.  alert and oriented x 3   Assessment:   Medicare wellness utd on preventive parameters    Plan:   During the course of the visit the patient was educated and counseled about appropriate screening and preventive services including:        Fall prevention is advised today  Diabetes screening is UTD Nutrition counseling is offered   Vaccines are updated as needed  Patient Instructions (the written plan) was given to the patient.

## 2017-09-28 ENCOUNTER — Telehealth: Payer: Self-pay | Admitting: Endocrinology

## 2017-09-28 MED ORDER — LINACLOTIDE 290 MCG PO CAPS: 290.0000 ug | ORAL_CAPSULE | Freq: Every day | ORAL | 11 refills | Status: DC

## 2017-09-28 NOTE — Telephone Encounter (Signed)
Notified patient.

## 2017-09-28 NOTE — Telephone Encounter (Signed)
Pt states the linzess is still not working please advise

## 2017-09-28 NOTE — Telephone Encounter (Signed)
I have sent a prescription to your pharmacy, to double the linzess

## 2017-10-04 ENCOUNTER — Other Ambulatory Visit: Payer: Self-pay

## 2017-10-04 MED ORDER — LINACLOTIDE 290 MCG PO CAPS: 290.0000 ug | ORAL_CAPSULE | Freq: Every day | ORAL | 11 refills | Status: DC

## 2017-10-04 NOTE — Telephone Encounter (Signed)
Patient stated the pharmacy haven't received medication Linzess, please advise

## 2017-10-04 NOTE — Telephone Encounter (Signed)
Send to [facility]/[place]

## 2017-10-04 NOTE — Telephone Encounter (Signed)
Called patient to ask if he could call back to let me know correct pharmacy because prescription was sent in on the 25th.

## 2017-10-04 NOTE — Telephone Encounter (Signed)
Done

## 2017-10-19 ENCOUNTER — Encounter: Payer: Medicare Other | Admitting: Endocrinology

## 2017-10-22 ENCOUNTER — Other Ambulatory Visit: Payer: Self-pay | Admitting: Endocrinology

## 2017-11-09 DIAGNOSIS — C61 Malignant neoplasm of prostate: Secondary | ICD-10-CM | POA: Diagnosis not present

## 2017-12-08 DIAGNOSIS — N5201 Erectile dysfunction due to arterial insufficiency: Secondary | ICD-10-CM | POA: Diagnosis not present

## 2017-12-08 DIAGNOSIS — N393 Stress incontinence (female) (male): Secondary | ICD-10-CM | POA: Diagnosis not present

## 2017-12-08 DIAGNOSIS — C61 Malignant neoplasm of prostate: Secondary | ICD-10-CM | POA: Diagnosis not present

## 2017-12-20 ENCOUNTER — Telehealth: Payer: Self-pay | Admitting: Endocrinology

## 2017-12-20 NOTE — Telephone Encounter (Signed)
[employer] called-re: the pharmacy script was sent to is not in network-please call them at ph# [phone] so they can update you on pharmacy that is in network-or call patient to call [employer]

## 2017-12-22 NOTE — Telephone Encounter (Signed)
I called and talked [employer] & patient needs to have meds sent to [facility] only because this there preferred pharmacy.

## 2018-01-18 ENCOUNTER — Other Ambulatory Visit: Payer: Self-pay | Admitting: Cardiology

## 2018-02-06 ENCOUNTER — Other Ambulatory Visit: Payer: Self-pay | Admitting: Endocrinology

## 2018-04-03 DIAGNOSIS — J029 Acute pharyngitis, unspecified: Secondary | ICD-10-CM | POA: Diagnosis not present

## 2018-04-03 DIAGNOSIS — J329 Chronic sinusitis, unspecified: Secondary | ICD-10-CM | POA: Diagnosis not present

## 2018-04-03 DIAGNOSIS — J111 Influenza due to unidentified influenza virus with other respiratory manifestations: Secondary | ICD-10-CM | POA: Diagnosis not present

## 2018-04-11 ENCOUNTER — Emergency Department (HOSPITAL_COMMUNITY)
Admission: EM | Admit: 2018-04-11 | Discharge: 2018-04-11 | Disposition: A | Discharge status: Home / Self Care | Payer: Medicare Other | Attending: Emergency Medicine | Admitting: Emergency Medicine

## 2018-04-11 ENCOUNTER — Encounter (HOSPITAL_COMMUNITY): Payer: Self-pay | Admitting: Emergency Medicine

## 2018-04-11 ENCOUNTER — Other Ambulatory Visit: Payer: Self-pay

## 2018-04-11 DIAGNOSIS — R6883 Chills (without fever): Secondary | ICD-10-CM | POA: Diagnosis not present

## 2018-04-11 DIAGNOSIS — Z87891 Personal history of nicotine dependence: Secondary | ICD-10-CM | POA: Insufficient documentation

## 2018-04-11 DIAGNOSIS — Z79899 Other long term (current) drug therapy: Secondary | ICD-10-CM | POA: Insufficient documentation

## 2018-04-11 DIAGNOSIS — Z8546 Personal history of malignant neoplasm of prostate: Secondary | ICD-10-CM | POA: Insufficient documentation

## 2018-04-11 DIAGNOSIS — R110 Nausea: Secondary | ICD-10-CM | POA: Diagnosis not present

## 2018-04-11 DIAGNOSIS — R070 Pain in throat: Secondary | ICD-10-CM | POA: Diagnosis present

## 2018-04-11 DIAGNOSIS — I10 Essential (primary) hypertension: Secondary | ICD-10-CM | POA: Insufficient documentation

## 2018-04-11 DIAGNOSIS — Z7982 Long term (current) use of aspirin: Secondary | ICD-10-CM | POA: Diagnosis not present

## 2018-04-11 DIAGNOSIS — Z7902 Long term (current) use of antithrombotics/antiplatelets: Secondary | ICD-10-CM | POA: Insufficient documentation

## 2018-04-11 DIAGNOSIS — J029 Acute pharyngitis, unspecified: Secondary | ICD-10-CM | POA: Diagnosis not present

## 2018-04-11 DIAGNOSIS — R42 Dizziness and giddiness: Secondary | ICD-10-CM | POA: Diagnosis not present

## 2018-04-11 DIAGNOSIS — I2510 Atherosclerotic heart disease of native coronary artery without angina pectoris: Secondary | ICD-10-CM | POA: Insufficient documentation

## 2018-04-11 LAB — CBC WITH DIFFERENTIAL/PLATELET
BASOS PCT: 1 %
Basophils Absolute: 0 10*3/uL (ref 0.0–0.1)
Eosinophils Absolute: 0.2 10*3/uL (ref 0.0–0.7)
Eosinophils Relative: 7 %
HEMATOCRIT: 41.1 % (ref 39.0–52.0)
HEMOGLOBIN: 13.2 g/dL (ref 13.0–17.0)
LYMPHS ABS: 1.3 10*3/uL (ref 0.7–4.0)
LYMPHS PCT: 42 %
MCH: 26.4 pg (ref 26.0–34.0)
MCHC: 32.1 g/dL (ref 30.0–36.0)
MCV: 82.2 fL (ref 78.0–100.0)
MONOS PCT: 10 %
Monocytes Absolute: 0.3 10*3/uL (ref 0.1–1.0)
NEUTROS ABS: 1.2 10*3/uL — AB (ref 1.7–7.7)
NEUTROS PCT: 40 %
Platelets: 168 10*3/uL (ref 150–400)
RBC: 5 MIL/uL (ref 4.22–5.81)
RDW: 12.9 % (ref 11.5–15.5)
WBC: 3 10*3/uL — ABNORMAL LOW (ref 4.0–10.5)

## 2018-04-11 LAB — COMPREHENSIVE METABOLIC PANEL
ALBUMIN: 3.7 g/dL (ref 3.5–5.0)
ALK PHOS: 63 U/L (ref 38–126)
ALT: 23 U/L (ref 17–63)
ANION GAP: 6 (ref 5–15)
AST: 22 U/L (ref 15–41)
BILIRUBIN TOTAL: 0.8 mg/dL (ref 0.3–1.2)
BUN: 12 mg/dL (ref 6–20)
CALCIUM: 9.2 mg/dL (ref 8.9–10.3)
CO2: 32 mmol/L (ref 22–32)
Chloride: 101 mmol/L (ref 101–111)
Creatinine, Ser: 1.27 mg/dL — ABNORMAL HIGH (ref 0.61–1.24)
GFR calc Af Amer: >60 mL/min (ref >60)
GFR, EST NON AFRICAN AMERICAN: 55 mL/min — AB (ref >60)
GLUCOSE: 102 mg/dL — AB (ref 65–99)
Potassium: 4 mmol/L (ref 3.5–5.1)
Sodium: 139 mmol/L (ref 135–145)
TOTAL PROTEIN: 6.3 g/dL — AB (ref 6.5–8.1)

## 2018-04-11 LAB — I-STAT TROPONIN, ED: TROPONIN I, POC: 0 ng/mL (ref 0.00–0.08)

## 2018-04-11 MED ORDER — SUCRALFATE 1 GM/10ML PO SUSP: 1.0000 g | Freq: Three times a day (TID) | ORAL | 0 refills | Status: DC

## 2018-04-11 MED ORDER — RANITIDINE HCL 150 MG PO TABS: 150.0000 mg | ORAL_TABLET | Freq: Two times a day (BID) | ORAL | 0 refills | Status: DC

## 2018-04-11 NOTE — ED Provider Notes (Signed)
[facility] EMERGENCY DEPARTMENT Provider Note   CSN: [id number] Arrival date & time: 04/11/18  0246     History   Chief Complaint Chief Complaint  Patient presents with  . Sore Throat    HPI Omar [name]. is a 72 y.o. male.  Patient presents to the emergency department with a list of complaints.  He reports that he has a sore throat, his tongue feels infected.  He has had nausea, feels woozy and off balance.  He has intermittent hot and cold spells.  He has not had chest pain.  He reports that his wife had the flu last week and he went to urgent care and was told he had a touch of the flu, but does not think that explains his symptoms.     Past Medical History:  Diagnosis Date  . Anxiety   . BPH (benign prostatic hypertrophy)   . Decreased white blood cell count   . Depression   . Diverticulosis   . Erectile dysfunction   . Gastric polyps   . GERD (gastroesophageal reflux disease)   . History of colonic polyps   . History of esophageal stricture 10/31/2007  . History of kidney stones   . History of rheumatic fever   . Hyperlipidemia   . Hypertension   . Prostate cancer ([place]) 11/09/2012  . Prostatism     Patient Active Problem List   Diagnosis Date Noted  . 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 S/P right colectomy 08/31/2017  . Itching 07/28/2017  . Neuropathy 11/21/2016  . Screening for malignant neoplasm of prostate 09/13/2016  . 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 Dizziness and giddiness 02/08/2016  . Chest pain 02/08/2016  . Cold intolerance 11/15/2015  . DOE (dyspnea on exertion) 07/18/2014  . History of pulmonary embolism 07/18/2014  . Numbness and tingling of right arm 07/17/2014  . RLQ abdominal pain 07/04/2014  . Pain in right testicle 07/04/2014  . 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 Coronary atherosclerosis of native coronary artery 03/18/2014  . Encounter for therapeutic drug  monitoring 01/07/2014  . Acute pulmonary embolism, main risk factor [place] car rides 11/13/2013  . Hyperlipidemia 11/13/2013  . 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 Umbilical hernia 40/98/1191  . Prostate cancer ([place]) 11/09/2012  . Stricture and stenosis of esophagus 05/31/2012  . Encounter for [place]-term (current) use of other medications 02/29/2012  . BENIGN POSITIONAL VERTIGO 08/17/2009  . LEUKOPENIA, MILD 05/27/2009  . Esophageal reflux 10/09/2007  . Dyslipidemia 07/16/2007  . Depression 07/16/2007  . COLONIC POLYPS, HX OF 07/16/2007    Past Surgical History:  Procedure Laterality Date  . CARPAL TUNNEL RELEASE     Bilateral  . ESOPHAGEAL DILATION     last dilation 8'12  . INGUINAL HERNIA REPAIR    . INSERTION OF MESH N/A 04/15/2013   Procedure: INSERTION OF MESH;  Surgeon: Odis Hollingshead, MD;  Location: [place];  Service: General;  Laterality: N/A;  . KNEE ARTHROSCOPY     left  . LAPAROSCOPIC RIGHT HEMI COLECTOMY Right 08/31/2017   Procedure: LAPAROSCOPIC ASSISTED  RIGHT HEMI COLECTOMY;  Surgeon: Johnathan Hausen, MD;  Location: [facility];  Service: General;  Laterality: Right;  . LEFT HEART CATHETERIZATION WITH CORONARY ANGIOGRAM N/A 07/18/2014   Procedure: LEFT HEART CATHETERIZATION WITH CORONARY ANGIOGRAM;  Surgeon: Burnell Blanks, MD;  Location: [facility] CATH LAB;  Service: Cardiovascular;  Laterality: N/A;  . ROBOT ASSISTED LAPAROSCOPIC RADICAL PROSTATECTOMY  11/08/2012   Procedure: ROBOTIC ASSISTED LAPAROSCOPIC RADICAL PROSTATECTOMY LEVEL 1;  Surgeon: Molli Hazard, MD;  Location: [facility];  Service: Urology;  Laterality: N/A;     . TONSILLECTOMY    . UMBILICAL HERNIA REPAIR N/A 04/15/2013   Procedure: HERNIA REPAIR UMBILICAL ADULT;  Surgeon: Odis Hollingshead, MD;  Location: [place];  Service: General;  Laterality: N/A;        Home Medications    Prior to Admission medications   Medication Sig Start Date End Date Taking? Authorizing Provider  amLODipine  (NORVASC) 5 MG tablet TAKE 1 TABLET (5 MG TOTAL) BY MOUTH DAILY. 01/18/18 04/18/18  Lelon Perla, MD  aspirin 81 MG tablet Take 81 mg by mouth daily.    [provider]  Lactulose 20 GM/30ML SOLN Take 30 mLs (20 g total) by mouth 3 (three) times daily. 07/17/17   Renato Shin, MD  linaclotide [facility]) 290 MCG CAPS capsule Take 1 capsule (290 mcg total) by mouth daily before breakfast. 10/04/17   Renato Shin, MD  ondansetron (ZOFRAN) 4 MG tablet Take 1 tablet (4 mg total) by mouth every 6 (six) hours as needed for nausea. 09/04/17   Johnathan Hausen, MD  pantoprazole (PROTONIX) 40 MG tablet TAKE 1 TABLET BY MOUTH EVERY DAY 02/06/18   Renato Shin, MD  pravastatin (PRAVACHOL) 40 MG tablet TAKE 1 TABLET (40 MG TOTAL) BY MOUTH EVERY EVENING. 01/18/18 04/18/18  Lelon Perla, MD  PRESCRIPTION MEDICATION Inject 0.25-0.4 mLs into the skin daily as needed (erectile dysfunction). Penile injection compounded at  [place]. Trimixture of Prostaglandin E-1, Papaverine, and Phentolamine.    [provider]  zolpidem (AMBIEN) 5 MG tablet Take 0.5 tablets (2.5 mg total) by mouth at bedtime as needed for sleep. 09/13/17   Renato Shin, MD  vardenafil (LEVITRA) 20 MG tablet Take 20 mg by mouth as needed.    02/29/12  [provider]    Family History Family History  Problem Relation Age of Onset  . Ovarian cancer Mother   . Cancer Mother   . Colon cancer Mother   . Stroke Father   . Lung cancer Maternal Uncle   . Lung cancer Maternal Aunt   . Esophageal cancer Neg Hx   . Stomach cancer Neg Hx   . Rectal cancer Neg Hx     Social History Social History   Tobacco Use  . Smoking status: Former Smoker    Packs/day: 1.00    Years: 25.00    Pack years: 25.00    Types: Cigarettes    Last attempt to quit: 12/05/1993    Years since quitting: 24.3  . Smokeless tobacco: Never Used  Substance Use Topics  . Alcohol use: No  . Drug use: No     Allergies     Sulfonamide derivatives   Review of Systems Review of Systems  Constitutional: Positive for fever.  HENT: Positive for sore throat.   Gastrointestinal: Positive for nausea.  Neurological: Positive for dizziness.  All other systems reviewed and are negative.    Physical Exam Updated Vital Signs BP (!) 149/93 (BP Location: Right Arm)   Pulse 82   Temp 97.9 F (36.6 C) (Oral)   Resp 16   Ht 6\' 3"  (1.905 m)   Wt 93 kg (205 lb)   SpO2 100%   BMI 25.62 kg/m   Physical Exam  Constitutional: He is oriented to person, place, and time. He appears well-developed and well-nourished. No distress.  HENT:  Head: Normocephalic and atraumatic.  Right Ear: Hearing normal.  Left Ear: Hearing normal.  Nose: Nose normal.  Mouth/Throat: Oropharynx is clear and moist and mucous membranes are normal.  Eyes: Pupils are equal, round, and reactive to light. Conjunctivae and EOM are normal.  Neck: Normal range of motion. Neck supple.  Cardiovascular: Regular rhythm, S1 normal and S2 normal. Exam reveals no gallop and no friction rub.  No murmur heard. Pulmonary/Chest: Effort normal and breath sounds normal. No respiratory distress. He exhibits no tenderness.  Abdominal: Soft. Normal appearance and bowel sounds are normal. There is no hepatosplenomegaly. There is no tenderness. There is no rebound, no guarding, no tenderness at McBurney's point and negative Murphy's sign. No hernia.  Musculoskeletal: Normal range of motion.  Neurological: He is alert and oriented to person, place, and time. He has normal strength. No cranial nerve deficit or sensory deficit. Coordination normal. GCS eye subscore is 4. GCS verbal subscore is 5. GCS motor subscore is 6.  Skin: Skin is warm, dry and intact. No rash noted. No cyanosis.  Psychiatric: He has a normal mood and affect. His speech is normal and behavior is normal. Thought content normal.  Nursing note and vitals reviewed.    ED Treatments / Results   Labs (all labs ordered are listed, but only abnormal results are displayed) Labs Reviewed  CBC WITH DIFFERENTIAL/PLATELET - Abnormal; Notable for the following components:      Result Value   WBC 3.0 (*)    Neutro Abs 1.2 (*)    All other components within normal limits  COMPREHENSIVE METABOLIC PANEL - Abnormal; Notable for the following components:   Glucose, Bld 102 (*)    Creatinine, Ser 1.27 (*)    Total Protein 6.3 (*)    GFR calc non Af Amer 55 (*)    All other components within normal limits  I-STAT TROPONIN, ED    EKG EKG Interpretation  Date/Time:  Wednesday Apr 11 2018 03:25:10 EDT Ventricular Rate:  70 PR Interval:    QRS Duration: 96 QT Interval:  396 QTC Calculation: 428 R Axis:   44 Text Interpretation:  Sinus rhythm Abnormal R-wave progression, early transition No significant change since last tracing Confirmed by Orpah Greek [phone]) on 04/11/2018 4:05:42 AM   Radiology No results found.  Procedures Procedures (including critical care time)  Medications Ordered in ED Medications - No data to display   Initial Impression / Assessment and Plan / ED Course  I have reviewed the triage vital signs and the nursing notes.  Pertinent labs & imaging results that were available during my care of the patient were reviewed by me and considered in my medical decision making (see chart for details).     Presents with multiple complaints.  He reports that his tongue hurts and feels swollen, like it might be infected.  He has noticed a sore throat.  He has had some chills but has not taken his temperature had any fevers.  He has had nausea and queasiness, no abdominal pain, chest pain or shortness of breath.  His exam is entirely normal including his oropharyngeal exam.  Patient was very concerned that he might have a serious problem, however.  He had blood work performed including cardiac evaluation.  All of his results are reassuring.  He tells me that he  does have a history of fairly significant reflux.  He takes Protonix daily.  We will add some Zantac and Carafate for 1 week, have follow-up with  PCP.  Final Clinical Impressions(s) / ED Diagnoses   Final diagnoses:  Pharyngitis, unspecified etiology    ED Discharge Orders    None       Jaydalyn Demattia, Gwenyth Allegra, MD 04/11/18 [phone]

## 2018-04-11 NOTE — ED Triage Notes (Signed)
Pt c/o sore throat, tongue pain, and chills intermittently since last week.

## 2018-04-27 ENCOUNTER — Ambulatory Visit (INDEPENDENT_AMBULATORY_CARE_PROVIDER_SITE_OTHER): Payer: Medicare Other | Admitting: Endocrinology

## 2018-04-27 ENCOUNTER — Other Ambulatory Visit: Payer: Self-pay

## 2018-04-27 ENCOUNTER — Encounter: Payer: Self-pay | Admitting: Endocrinology

## 2018-04-27 DIAGNOSIS — R61 Generalized hyperhidrosis: Secondary | ICD-10-CM

## 2018-04-27 LAB — CBC WITH DIFFERENTIAL/PLATELET
Basophils Absolute: 0.1 10*3/uL (ref 0.0–0.1)
Basophils Relative: 1.8 % (ref 0.0–3.0)
EOS ABS: 0.2 10*3/uL (ref 0.0–0.7)
Eosinophils Relative: 6.7 % — ABNORMAL HIGH (ref 0.0–5.0)
HCT: 41.4 % (ref 39.0–52.0)
Hemoglobin: 13.6 g/dL (ref 13.0–17.0)
LYMPHS ABS: 1 10*3/uL (ref 0.7–4.0)
Lymphocytes Relative: 35.6 % (ref 12.0–46.0)
MCHC: 33 g/dL (ref 30.0–36.0)
MCV: 80.7 fl (ref 78.0–100.0)
MONO ABS: 0.3 10*3/uL (ref 0.1–1.0)
MONOS PCT: 11.4 % (ref 3.0–12.0)
NEUTROS ABS: 1.3 10*3/uL — AB (ref 1.4–7.7)
NEUTROS PCT: 44.5 % (ref 43.0–77.0)
PLATELETS: 197 10*3/uL (ref 150.0–400.0)
RBC: 5.13 Mil/uL (ref 4.22–5.81)
RDW: 13.6 % (ref 11.5–15.5)
WBC: 2.8 10*3/uL — ABNORMAL LOW (ref 4.0–10.5)

## 2018-04-27 LAB — BASIC METABOLIC PANEL
BUN: 12 mg/dL (ref 6–23)
CO2: 30 mEq/L (ref 19–32)
CREATININE: 1.28 mg/dL (ref 0.40–1.50)
Calcium: 9.3 mg/dL (ref 8.4–10.5)
Chloride: 103 mEq/L (ref 96–112)
GFR: 71 mL/min (ref >60.00)
Glucose, Bld: 92 mg/dL (ref 70–99)
Potassium: 4.1 mEq/L (ref 3.5–5.1)
Sodium: 139 mEq/L (ref 135–145)

## 2018-04-27 LAB — TSH: TSH: 0.94 u[IU]/mL (ref 0.35–4.50)

## 2018-04-27 MED ORDER — VALACYCLOVIR HCL 1 G PO TABS: 1000.0000 mg | ORAL_TABLET | Freq: Two times a day (BID) | ORAL | 0 refills | Status: DC

## 2018-04-27 NOTE — Patient Instructions (Signed)
blood tests are requested for you today.  We'll let you know about the results. I have sent a prescription to your pharmacy, to try to help this.  I hope you feel better soon.  If you don't feel better by next week, please call back.  Please call sooner if you get worse.

## 2018-04-27 NOTE — Progress Notes (Signed)
Subjective:    Patient ID: Omar [name]., male    DOB: [birth year], 71 y.o.   MRN: [id number]  HPI Pt states 1 week of intermittent moderate pain at the inside of the mouth (sore throat is resolved), and assoc nausea.   Past Medical History:  Diagnosis Date  . Anxiety   . BPH (benign prostatic hypertrophy)   . Decreased white blood cell count   . Depression   . Diverticulosis   . Erectile dysfunction   . Gastric polyps   . GERD (gastroesophageal reflux disease)   . History of colonic polyps   . History of esophageal stricture 10/31/2007  . History of kidney stones   . History of rheumatic fever   . Hyperlipidemia   . Hypertension   . Prostate cancer ([place]) 11/09/2012  . Prostatism     Past Surgical History:  Procedure Laterality Date  . CARPAL TUNNEL RELEASE     Bilateral  . ESOPHAGEAL DILATION     last dilation 8'12  . INGUINAL HERNIA REPAIR    . INSERTION OF MESH N/A 04/15/2013   Procedure: INSERTION OF MESH;  Surgeon: Odis Hollingshead, MD;  Location: [place];  Service: General;  Laterality: N/A;  . KNEE ARTHROSCOPY     left  . LAPAROSCOPIC RIGHT HEMI COLECTOMY Right 08/31/2017   Procedure: LAPAROSCOPIC ASSISTED  RIGHT HEMI COLECTOMY;  Surgeon: Johnathan Hausen, MD;  [facility];  Service: General;  Laterality: Right;  . LEFT HEART CATHETERIZATION WITH CORONARY ANGIOGRAM N/A 07/18/2014   Procedure: LEFT HEART CATHETERIZATION WITH CORONARY ANGIOGRAM;  Surgeon: Burnell Blanks, MD;  Location: [facility] CATH LAB;  Service: Cardiovascular;  Laterality: N/A;  . ROBOT ASSISTED LAPAROSCOPIC RADICAL PROSTATECTOMY  11/08/2012   Procedure: ROBOTIC ASSISTED LAPAROSCOPIC RADICAL PROSTATECTOMY LEVEL 1;  Surgeon: Molli Hazard, MD;  Location: [facility];  Service: Urology;  Laterality: N/A;     . TONSILLECTOMY    . UMBILICAL HERNIA REPAIR N/A 04/15/2013   Procedure: HERNIA REPAIR UMBILICAL ADULT;  Surgeon: Odis Hollingshead, MD;  Location: [place];  Service: General;  Laterality:  N/A;    Social History   Socioeconomic History  . Marital status: Married    Spouse name: Not on file  . Number of children: 4  . Years of education: Not on file  . Highest education level: Not on file  Occupational History  . Occupation: Mining engineer: [employer]  . Financial resource strain: Not on file  . Food insecurity:    Worry: Not on file    Inability: Not on file  . Transportation needs:    Medical: Not on file    Non-medical: Not on file  Tobacco Use  . Smoking status: Former Smoker    Packs/day: 1.00    Years: 25.00    Pack years: 25.00    Types: Cigarettes    Last attempt to quit: 12/05/1993    Years since quitting: 24.4  . Smokeless tobacco: Never Used  Substance and Sexual Activity  . Alcohol use: No  . Drug use: No  . Sexual activity: Yes  Lifestyle  . Physical activity:    Days per week: Not on file    Minutes per session: Not on file  . Stress: Not on file  Relationships  . Social connections:    Talks on phone: Not on file    Gets together: Not on file    Attends religious service: Not on  file    Active member of club or organization: Not on file    Attends meetings of clubs or organizations: Not on file    Relationship status: Not on file  . Intimate partner violence:    Fear of current or ex partner: Not on file    Emotionally abused: Not on file    Physically abused: Not on file    Forced sexual activity: Not on file  Other Topics Concern  . Not on file  Social History Narrative   Lives with wife in a one story home.  Has 4 children.  Works as a Theme park manager.     Education: 10th grade.     Current Outpatient Medications on File Prior to Visit  Medication Sig Dispense Refill  . amLODipine (NORVASC) 5 MG tablet TAKE 1 TABLET (5 MG TOTAL) BY MOUTH DAILY. 90 tablet 3  . aspirin 81 MG tablet Take 81 mg by mouth daily.    . Lactulose 20 GM/30ML SOLN Take 30 mLs (20 g total) by mouth 3 (three) times daily. 1892  mL 11  . linaclotide (LINZESS) 290 MCG CAPS capsule Take 1 capsule (290 mcg total) by mouth daily before breakfast. 30 capsule 11  . ondansetron (ZOFRAN) 4 MG tablet Take 1 tablet (4 mg total) by mouth every 6 (six) hours as needed for nausea. 20 tablet 0  . pantoprazole (PROTONIX) 40 MG tablet TAKE 1 TABLET BY MOUTH EVERY DAY 90 tablet 2  . pravastatin (PRAVACHOL) 40 MG tablet TAKE 1 TABLET (40 MG TOTAL) BY MOUTH EVERY EVENING. 90 tablet 3  . PRESCRIPTION MEDICATION Inject 0.25-0.4 mLs into the skin daily as needed (erectile dysfunction). Penile injection compounded at  [place]. Trimixture of Prostaglandin E-1, Papaverine, and Phentolamine.    . ranitidine (ZANTAC) 150 MG tablet Take 1 tablet (150 mg total) by mouth 2 (two) times daily. 14 tablet 0  . sucralfate (CARAFATE) 1 GM/10ML suspension Take 10 mLs (1 g total) by mouth 4 (four) times daily -  with meals and at bedtime. 420 mL 0  . zolpidem (AMBIEN) 5 MG tablet Take 0.5 tablets (2.5 mg total) by mouth at bedtime as needed for sleep. 15 tablet 0  . [DISCONTINUED] vardenafil (LEVITRA) 20 MG tablet Take 20 mg by mouth as needed.       No current facility-administered medications on file prior to visit.     Allergies  Allergen Reactions  . Sulfonamide Derivatives Other (See Comments)     blisters on skin    Family History  Problem Relation Age of Onset  . Ovarian cancer Mother   . Cancer Mother   . Colon cancer Mother   . Stroke Father   . Lung cancer Maternal Uncle   . Lung cancer Maternal Aunt   . Esophageal cancer Neg Hx   . Stomach cancer Neg Hx   . Rectal cancer Neg Hx     BP 124/82   Pulse 82   Wt 198 lb 3.2 oz (89.9 kg)   SpO2 97%   BMI 24.77 kg/m    Review of Systems Denies fever, but he has intermitt diaphoresis.  No vomiting/earache/nasal congestion.      Objective:   Physical Exam VITAL SIGNS:  See vs page GENERAL: no distress head: no deformity  eyes: no periorbital swelling, no proptosis    external nose and ears are normal  mouth: no lesion seen Both eac's and tm's are normal NECK: There is no palpable thyroid enlargement.  No  thyroid nodule is palpable.  No palpable lymphadenopathy at the neck.        Assessment & Plan:  Oral pain, new, uncertain etiology   Patient Instructions  blood tests are requested for you today.  We'll let you know about the results. I have sent a prescription to your pharmacy, to try to help this.  I hope you feel better soon.  If you don't feel better by next week, please call back.  Please call sooner if you get worse.

## 2018-07-30 ENCOUNTER — Other Ambulatory Visit: Payer: Self-pay | Admitting: Endocrinology

## 2018-08-01 ENCOUNTER — Other Ambulatory Visit: Payer: Self-pay

## 2018-08-01 ENCOUNTER — Ambulatory Visit (INDEPENDENT_AMBULATORY_CARE_PROVIDER_SITE_OTHER): Payer: Medicare Other | Admitting: Emergency Medicine

## 2018-08-01 ENCOUNTER — Encounter: Payer: Self-pay | Admitting: Emergency Medicine

## 2018-08-01 VITALS — BP 130/80 | HR 82 | Temp 98.3°F | Resp 16 | Ht 73.0 in | Wt 198.2 lb

## 2018-08-01 DIAGNOSIS — R202 Paresthesia of skin: Secondary | ICD-10-CM | POA: Insufficient documentation

## 2018-08-01 DIAGNOSIS — R109 Unspecified abdominal pain: Secondary | ICD-10-CM | POA: Insufficient documentation

## 2018-08-01 LAB — CBC WITH DIFFERENTIAL/PLATELET
BASOS ABS: 0 10*3/uL (ref 0.0–0.2)
Basos: 0 %
EOS (ABSOLUTE): 0.1 10*3/uL (ref 0.0–0.4)
EOS: 2 %
HEMATOCRIT: 37.8 % (ref 37.5–51.0)
HEMOGLOBIN: 12.2 g/dL — AB (ref 13.0–17.7)
IMMATURE GRANS (ABS): 0 10*3/uL (ref 0.0–0.1)
IMMATURE GRANULOCYTES: 0 %
LYMPHS: 43 %
Lymphocytes Absolute: 1.6 10*3/uL (ref 0.7–3.1)
MCH: 30.8 pg (ref 26.6–33.0)
MCHC: 32.3 g/dL (ref 31.5–35.7)
MCV: 96 fL (ref 79–97)
MONOCYTES: 7 %
Monocytes Absolute: 0.2 10*3/uL (ref 0.1–0.9)
NEUTROS PCT: 48 %
Neutrophils Absolute: 1.8 10*3/uL (ref 1.4–7.0)
Platelets: 233 10*3/uL (ref 150–450)
RBC: 3.96 x10E6/uL — AB (ref 4.14–5.80)
RDW: 14.5 % (ref 12.3–15.4)
WBC: 3.7 10*3/uL (ref 3.4–10.8)

## 2018-08-01 LAB — COMPREHENSIVE METABOLIC PANEL
ALBUMIN: 4.2 g/dL (ref 3.5–4.8)
ALK PHOS: 97 IU/L (ref 39–117)
ALT: 49 IU/L — ABNORMAL HIGH (ref 0–44)
AST: 24 IU/L (ref 0–40)
Albumin/Globulin Ratio: 1.9 (ref 1.2–2.2)
BUN / CREAT RATIO: 9 — AB (ref 10–24)
BUN: 11 mg/dL (ref 8–27)
Bilirubin Total: 0.5 mg/dL (ref 0.0–1.2)
CALCIUM: 9.5 mg/dL (ref 8.6–10.2)
CO2: 27 mmol/L (ref 20–29)
CREATININE: 1.21 mg/dL (ref 0.76–1.27)
Chloride: 103 mmol/L (ref 96–106)
GFR calc Af Amer: 69 mL/min/{1.73_m2} (ref >59)
GFR, EST NON AFRICAN AMERICAN: 59 mL/min/{1.73_m2} — AB (ref >59)
GLOBULIN, TOTAL: 2.2 g/dL (ref 1.5–4.5)
GLUCOSE: 100 mg/dL — AB (ref 65–99)
Potassium: 4.3 mmol/L (ref 3.5–5.2)
Sodium: 142 mmol/L (ref 134–144)
TOTAL PROTEIN: 6.4 g/dL (ref 6.0–8.5)

## 2018-08-01 LAB — POCT URINALYSIS DIP (MANUAL ENTRY)
BILIRUBIN UA: NEGATIVE mg/dL
Bilirubin, UA: NEGATIVE
Blood, UA: NEGATIVE
GLUCOSE UA: NEGATIVE mg/dL
Leukocytes, UA: NEGATIVE
Nitrite, UA: NEGATIVE
PROTEIN UA: NEGATIVE mg/dL
Spec Grav, UA: 1.015 (ref 1.010–1.025)
Urobilinogen, UA: 0.2 E.U./dL
pH, UA: 6 (ref 5.0–8.0)

## 2018-08-01 NOTE — Patient Instructions (Addendum)

## 2018-08-01 NOTE — Progress Notes (Signed)
Omar [name]. 72 y.o.   Chief Complaint  Patient presents with  . Establish Care    left side pain   . Medication Refill  . Numbness    both     HISTORY OF PRESENT ILLNESS: This is a 72 y.o. male.  Has the following chronic medical problems: 1.  Hypertension 2.  GERD 3.  History of prostate cancer 4.  Diverticulosis 5.  Dyslipidemia Has to chronic complaints.  Chronic numbness to both feet.  Also has some chronic intermittent pain to his left flank.  Negative work-ups in the past.  No history of diabetes.  Denies abdominal pain, fever or chills, nausea or vomiting, or urinary symptoms. HPI   Prior to Admission medications   Medication Sig Start Date End Date Taking? Authorizing Provider  aspirin 81 MG tablet Take 81 mg by mouth daily.   Yes [provider]  lactulose (CHRONULAC) 10 GM/15ML solution TAKE 30ML'S BY MOUTH 3 TIMES A DAY 07/31/18  Yes Renato Shin, MD  linaclotide [facility]) 290 MCG CAPS capsule Take 1 capsule (290 mcg total) by mouth daily before breakfast. 10/04/17  Yes Renato Shin, MD  Multiple Vitamins-Minerals (CENTRUM MEN PO) Take by mouth daily.   Yes [provider]  pantoprazole (PROTONIX) 40 MG tablet TAKE 1 TABLET BY MOUTH EVERY DAY 02/06/18  Yes Renato Shin, MD  PRESCRIPTION MEDICATION Inject 0.25-0.4 mLs into the skin daily as needed (erectile dysfunction). Penile injection compounded at  [place]. Trimixture of Prostaglandin E-1, Papaverine, and Phentolamine.   Yes [provider]  ranitidine (ZANTAC) 150 MG tablet Take 1 tablet (150 mg total) by mouth 2 (two) times daily. 04/11/18  Yes Pollina, Gwenyth Allegra, MD  amLODipine (NORVASC) 5 MG tablet TAKE 1 TABLET (5 MG TOTAL) BY MOUTH DAILY. 01/18/18 04/18/18  Lelon Perla, MD  ondansetron (ZOFRAN) 4 MG tablet Take 1 tablet (4 mg total) by mouth every 6 (six) hours as needed for nausea. Patient not taking: Reported on 08/01/2018 09/04/17   Johnathan Hausen, MD    pravastatin (PRAVACHOL) 40 MG tablet TAKE 1 TABLET (40 MG TOTAL) BY MOUTH EVERY EVENING. 01/18/18 04/18/18  Lelon Perla, MD  sucralfate (CARAFATE) 1 GM/10ML suspension Take 10 mLs (1 g total) by mouth 4 (four) times daily -  with meals and at bedtime. Patient not taking: Reported on 08/01/2018 04/11/18   Orpah Greek, MD  valACYclovir (VALTREX) 1000 MG tablet Take 1 tablet (1,000 mg total) by mouth 2 (two) times daily. Patient not taking: Reported on 08/01/2018 04/27/18   Renato Shin, MD  zolpidem (AMBIEN) 5 MG tablet Take 0.5 tablets (2.5 mg total) by mouth at bedtime as needed for sleep. Patient not taking: Reported on 08/01/2018 09/13/17   Renato Shin, MD  vardenafil (LEVITRA) 20 MG tablet Take 20 mg by mouth as needed.    02/29/12  [provider]    Allergies  Allergen Reactions  . Sulfonamide Derivatives Other (See Comments)     blisters on skin    Patient Active Problem List   Diagnosis Date Noted  . 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 S/P right colectomy 08/31/2017  . Neuropathy 11/21/2016  . Screening for malignant neoplasm of prostate 09/13/2016  . 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 Cold intolerance 11/15/2015  . DOE (dyspnea on exertion) 07/18/2014  . History of pulmonary embolism 07/18/2014  . 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 Coronary atherosclerosis of native coronary  artery 03/18/2014  . Acute pulmonary embolism, main risk factor [place] car rides 11/13/2013  . Hyperlipidemia 11/13/2013  . 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 Umbilical hernia 64/40/3474  . Prostate cancer ([place]) 11/09/2012  . Stricture and stenosis of esophagus 05/31/2012  . LEUKOPENIA, MILD 05/27/2009  . Esophageal reflux 10/09/2007  . Dyslipidemia 07/16/2007  . Depression 07/16/2007  . COLONIC POLYPS, HX OF 07/16/2007    Past Medical History:  Diagnosis Date  . Anxiety   . BPH (benign prostatic  hypertrophy)   . Decreased white blood cell count   . Depression   . Diverticulosis   . Erectile dysfunction   . Gastric polyps   . GERD (gastroesophageal reflux disease)   . History of colonic polyps   . History of esophageal stricture 10/31/2007  . History of kidney stones   . History of rheumatic fever   . Hyperlipidemia   . Hypertension   . Prostate cancer ([place]) 11/09/2012  . Prostatism     Past Surgical History:  Procedure Laterality Date  . CARPAL TUNNEL RELEASE     Bilateral  . ESOPHAGEAL DILATION     last dilation 8'12  . INGUINAL HERNIA REPAIR    . INSERTION OF MESH N/A 04/15/2013   Procedure: INSERTION OF MESH;  Surgeon: Odis Hollingshead, MD;  Location: [place];  Service: General;  Laterality: N/A;  . KNEE ARTHROSCOPY     left  . LAPAROSCOPIC RIGHT HEMI COLECTOMY Right 08/31/2017   Procedure: LAPAROSCOPIC ASSISTED  RIGHT HEMI COLECTOMY;  Surgeon: Johnathan Hausen, MD;  Location[facility];  Service: General;  Laterality: Right;  . LEFT HEART CATHETERIZATION WITH CORONARY ANGIOGRAM N/A 07/18/2014   Procedure: LEFT HEART CATHETERIZATION WITH CORONARY ANGIOGRAM;  Surgeon: Burnell Blanks, MD;  [facility]: [facility] CATH LAB;  Service: Cardiovascular;  Laterality: N/A;  . ROBOT ASSISTED LAPAROSCOPIC RADICAL PROSTATECTOMY  11/08/2012   Procedure: ROBOTIC ASSISTED LAPAROSCOPIC RADICAL PROSTATECTOMY LEVEL 1;  Surgeon: Molli Hazard, MD;  Location: [facility];  Service: Urology;  Laterality: N/A;     . TONSILLECTOMY    . UMBILICAL HERNIA REPAIR N/A 04/15/2013   Procedure: HERNIA REPAIR UMBILICAL ADULT;  Surgeon: Odis Hollingshead, MD;  Location: [place];  Service: General;  Laterality: N/A;    Social History   Socioeconomic History  . Marital status: Married    Spouse name: Not on file  . Number of children: 4  . Years of education: Not on file  . Highest education level: Not on file  Occupational History  . 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 Financial resource strain: Not on file  . Food insecurity:    Worry: Not on file    Inability: Not on file  . Transportation needs:    Medical: Not on file    Non-medical: Not on file  Tobacco Use  . Smoking status: Former Smoker    Packs/day: 1.00    Years: 25.00    Pack years: 25.00    Types: Cigarettes    Last attempt to quit: 12/05/1993    Years since quitting: 24.6  . Smokeless tobacco: Never Used  Substance and Sexual Activity  . Alcohol use: No  . Drug use: No  . Sexual activity: Yes  Lifestyle  . Physical activity:    Days per week: Not on file    Minutes per session: Not on file  . Stress: Not on file  Relationships  . Social  connections:    Talks on phone: Not on file    Gets together: Not on file    Attends religious service: Not on file    Active member of club or organization: Not on file    Attends meetings of clubs or organizations: Not on file    Relationship status: Not on file  . Intimate partner violence:    Fear of current or ex partner: Not on file    Emotionally abused: Not on file    Physically abused: Not on file    Forced sexual activity: Not on file  Other Topics Concern  . Not on file  Social History Narrative   Lives with wife in a one story home.  Has 4 children.  Works as a Theme park manager.     Education: 10th grade.     Family History  Problem Relation Age of Onset  . Ovarian cancer Mother   . Cancer Mother   . Colon cancer Mother   . Stroke Father   . Lung cancer Maternal Uncle   . Lung cancer Maternal Aunt   . Esophageal cancer Neg Hx   . Stomach cancer Neg Hx   . Rectal cancer Neg Hx      Review of Systems  Constitutional: Negative.  Negative for chills and fever.  HENT: Negative.  Negative for sore throat.   Eyes: Negative.  Negative for blurred vision and double vision.  Respiratory: Negative.  Negative for cough and shortness of breath.   Cardiovascular: Negative.  Negative for chest pain and palpitations.   Gastrointestinal: Negative.  Negative for abdominal pain, nausea and vomiting.  Genitourinary: Positive for flank pain.  Skin: Negative.  Negative for rash.  Neurological: Negative.  Negative for dizziness and headaches.       Feet numbness  Endo/Heme/Allergies: Negative.   All other systems reviewed and are negative.   Vitals:   08/01/18 1115  BP: 130/80  Pulse: 82  Resp: 16  Temp: 98.3 F (36.8 C)  SpO2: 98%    Physical Exam  Constitutional: He is oriented to person, place, and time. He appears well-nourished.  HENT:  Head: Normocephalic and atraumatic.  Right Ear: External ear normal.  Left Ear: External ear normal.  Nose: Nose normal.  Mouth/Throat: Oropharynx is clear and moist.  Eyes: Pupils are equal, round, and reactive to light. Conjunctivae and EOM are normal.  Neck: Normal range of motion. Neck supple. No thyromegaly present.  Cardiovascular: Normal rate, regular rhythm and normal heart sounds.  Pulmonary/Chest: Effort normal and breath sounds normal.  Abdominal: Soft. Bowel sounds are normal. He exhibits no distension and no mass. There is no tenderness. There is no rebound.  Musculoskeletal: Normal range of motion. He exhibits no edema or tenderness.  Lymphadenopathy:    He has no cervical adenopathy.  Neurological: He is alert and oriented to person, place, and time. No sensory deficit. He exhibits normal muscle tone.  Skin: Skin is warm and dry. Capillary refill takes less than 2 seconds.  Psychiatric: He has a normal mood and affect. His behavior is normal.  Vitals reviewed.    ASSESSMENT & PLAN: Omar [name] was seen today for establish care, medication refill and numbness.  Diagnoses and all orders for this visit:  Left flank pain -     CBC with Differential/Platelet -     Comprehensive metabolic panel -     POCT urinalysis dipstick  Paresthesia of both feet -     CBC with Differential/Platelet -  Comprehensive metabolic panel -     POCT  urinalysis dipstick  A total of 30 minutes was spent in the room with the patient, greater than 50% of which was in counseling/coordination of care regarding chronic medical problems, medications, differential diagnosis of acute problems, management and need for follow-up.   Patient Instructions       If you have lab work done today you will be contacted with your lab results within the next 2 weeks.  If you have not heard from [place] then please contact us. The fastest way to get your results is to register for My Chart.   IF you received an x-ray today, you will receive an invoice from [facility]. Please contact [facility] at [phone] with questions or concerns regarding your invoice.   IF you received labwork today, you will receive an invoice from [place]. Please contact [facility] at [phone] with questions or concerns regarding your invoice.   Our billing staff will not be able to assist you with questions regarding bills from these companies.  You will be contacted with the lab results as soon as they are available. The fastest way to get your results is to activate your My Chart account. Instructions are located on the last page of this paperwork. If you have not heard from [place] regarding the results in 2 weeks, please contact this office.     Health Maintenance, Male A healthy lifestyle and preventive care is important for your health and wellness. Ask your health care provider about what schedule of regular examinations is right for you. What should I know about weight and diet? Eat a Healthy Diet  Eat plenty of vegetables, fruits, whole grains, low-fat dairy products, and lean protein.  Do not eat a lot of foods high in solid fats, added sugars, or salt.  Maintain a Healthy Weight Regular exercise can help you achieve or maintain a healthy weight. You should:  Do at least 150 minutes of exercise each week. The exercise should increase your heart  rate and make you sweat (moderate-intensity exercise).  Do strength-training exercises at least twice a week.  Watch Your Levels of Cholesterol and Blood Lipids  Have your blood tested for lipids and cholesterol every 5 years starting at 72 years of age. If you are at high risk for heart disease, you should start having your blood tested when you are 72 years old. You may need to have your cholesterol levels checked more often if: ? Your lipid or cholesterol levels are high. ? You are older than 73 years of age. ? You are at high risk for heart disease.  What should I know about cancer screening? Many types of cancers can be detected early and may often be prevented. Lung Cancer  You should be screened every year for lung cancer if: ? You are a current smoker who has smoked for at least 30 years. ? You are a former smoker who has quit within the past 15 years.  Talk to your health care provider about your screening options, when you should start screening, and how often you should be screened.  Colorectal Cancer  Routine colorectal cancer screening usually begins at 72 years of age and should be repeated every 5-10 years until you are 72 years old. You may need to be screened more often if early forms of precancerous polyps or small growths are found. Your health care provider may recommend screening at an earlier age if you have risk factors for  colon cancer.  Your health care provider may recommend using home test kits to check for hidden blood in the stool.  A small camera at the end of a tube can be used to examine your colon (sigmoidoscopy or colonoscopy). This checks for the earliest forms of colorectal cancer.  Prostate and Testicular Cancer  Depending on your age and overall health, your health care provider may do certain tests to screen for prostate and testicular cancer.  Talk to your health care provider about any symptoms or concerns you have about testicular or prostate  cancer.  Skin Cancer  Check your skin from head to toe regularly.  Tell your health care provider about any new moles or changes in moles, especially if: ? There is a change in a mole's size, shape, or color. ? You have a mole that is larger than a pencil eraser.  Always use sunscreen. Apply sunscreen liberally and repeat throughout the day.  Protect yourself by wearing Power sleeves, pants, a wide-brimmed hat, and sunglasses when outside.  What should I know about heart disease, diabetes, and high blood pressure?  If you are 34-40 years of age, have your blood pressure checked every 3-5 years. If you are 84 years of age or older, have your blood pressure checked every year. You should have your blood pressure measured twice-once when you are at a hospital or clinic, and once when you are not at a hospital or clinic. Record the average of the two measurements. To check your blood pressure when you are not at a hospital or clinic, you can use: ? An automated blood pressure machine at a pharmacy. ? A home blood pressure monitor.  Talk to your health care provider about your target blood pressure.  If you are between 31-23 years old, ask your health care provider if you should take aspirin to prevent heart disease.  Have regular diabetes screenings by checking your fasting blood sugar level. ? If you are at a normal weight and have a low risk for diabetes, have this test once every three years after the age of 46. ? If you are overweight and have a high risk for diabetes, consider being tested at a younger age or more often.  A one-time screening for abdominal aortic aneurysm (AAA) by ultrasound is recommended for men aged 18-75 years who are current or former smokers. What should I know about preventing infection? Hepatitis B If you have a higher risk for hepatitis B, you should be screened for this virus. Talk with your health care provider to find out if you are at risk for hepatitis B  infection. Hepatitis C Blood testing is recommended for:  Everyone born from [birth year] through 1965.  Anyone with known risk factors for hepatitis C.  Sexually Transmitted Diseases (STDs)  You should be screened each year for STDs including gonorrhea and chlamydia if: ? You are sexually active and are younger than 72 years of age. ? You are older than 72 years of age and your health care provider tells you that you are at risk for this type of infection. ? Your sexual activity has changed since you were last screened and you are at an increased risk for chlamydia or gonorrhea. Ask your health care provider if you are at risk.  Talk with your health care provider about whether you are at high risk of being infected with HIV. Your health care provider may recommend a prescription medicine to help prevent HIV infection.  What else  can I do?  Schedule regular health, dental, and eye exams.  Stay current with your vaccines (immunizations).  Do not use any tobacco products, such as cigarettes, chewing tobacco, and e-cigarettes. If you need help quitting, ask your health care provider.  Limit alcohol intake to no more than 2 drinks per day. One drink equals 12 ounces of beer, 5 ounces of wine, or 1 ounces of hard liquor.  Do not use street drugs.  Do not share needles.  Ask your health care provider for help if you need support or information about quitting drugs.  Tell your health care provider if you often feel depressed.  Tell your health care provider if you have ever been abused or do not feel safe at home. This information is not intended to replace advice given to you by your health care provider. Make sure you discuss any questions you have with your health care provider. Document Released: 05/19/2008 Document Revised: 07/20/2016 Document Reviewed: 08/25/2015 Elsevier Interactive Patient Education  2018 Elsevier Inc.     Agustina Caroli, MD [facility]

## 2018-08-08 ENCOUNTER — Encounter: Payer: Self-pay | Admitting: *Deleted

## 2018-08-29 ENCOUNTER — Ambulatory Visit
Admission: RE | Admit: 2018-08-29 | Discharge: 2018-08-29 | Disposition: A | Discharge status: Home / Self Care | Payer: Medicare Other | Source: Ambulatory Visit | Attending: Endocrinology | Admitting: Endocrinology

## 2018-08-29 ENCOUNTER — Encounter: Payer: Self-pay | Admitting: Endocrinology

## 2018-08-29 ENCOUNTER — Ambulatory Visit (INDEPENDENT_AMBULATORY_CARE_PROVIDER_SITE_OTHER): Payer: Medicare Other | Admitting: Endocrinology

## 2018-08-29 VITALS — BP 131/77 | HR 101 | Ht 73.0 in | Wt 197.6 lb

## 2018-08-29 DIAGNOSIS — R079 Chest pain, unspecified: Secondary | ICD-10-CM

## 2018-08-29 DIAGNOSIS — Z8679 Personal history of other diseases of the circulatory system: Secondary | ICD-10-CM | POA: Diagnosis not present

## 2018-08-29 MED ORDER — RANITIDINE HCL 150 MG PO TABS: 150.0000 mg | ORAL_TABLET | Freq: Two times a day (BID) | ORAL | 0 refills | Status: DC

## 2018-08-29 MED ORDER — SUCRALFATE 1 GM/10ML PO SUSP: 1.0000 g | Freq: Three times a day (TID) | ORAL | 0 refills | Status: DC

## 2018-08-29 NOTE — Progress Notes (Signed)
Subjective:    Patient ID: Omar [name]., male    DOB: [birth year], 72 y.o.   MRN: [id number]  HPI 1 week of moderate pain at the throat, and assoc slight pain at the bifrontal areas.   Past Medical History:  Diagnosis Date  . Anxiety   . BPH (benign prostatic hypertrophy)   . Decreased white blood cell count   . Depression   . Diverticulosis   . Erectile dysfunction   . Gastric polyps   . GERD (gastroesophageal reflux disease)   . History of colonic polyps   . History of esophageal stricture 10/31/2007  . History of kidney stones   . History of prostate surgery   . History of rheumatic fever   . Hyperlipidemia   . Hypertension   . Prostate cancer ([place]) 11/09/2012  . Prostatism     Past Surgical History:  Procedure Laterality Date  . CARPAL TUNNEL RELEASE     Bilateral  . ESOPHAGEAL DILATION     last dilation 8'12  . INGUINAL HERNIA REPAIR    . INSERTION OF MESH N/A 04/15/2013   Procedure: INSERTION OF MESH;  Surgeon: Odis Hollingshead, MD;  Location: [place];  Service: General;  Laterality: N/A;  . KNEE ARTHROSCOPY     left  . LAPAROSCOPIC RIGHT HEMI COLECTOMY Right 08/31/2017   Procedure: LAPAROSCOPIC ASSISTED  RIGHT HEMI COLECTOMY;  Surgeon: Johnathan Hausen, MD;  [facility];  Service: General;  Laterality: Right;  . LEFT HEART CATHETERIZATION WITH CORONARY ANGIOGRAM N/A 07/18/2014   Procedure: LEFT HEART CATHETERIZATION WITH CORONARY ANGIOGRAM;  Surgeon: Burnell Blanks, MD;  Location[facility] LAB;  Service: Cardiovascular;  Laterality: N/A;  . ROBOT ASSISTED LAPAROSCOPIC RADICAL PROSTATECTOMY  11/08/2012   Procedure: ROBOTIC ASSISTED LAPAROSCOPIC RADICAL PROSTATECTOMY LEVEL 1;  Surgeon: Molli Hazard, MD;  [facility];  Service: Urology;  Laterality: N/A;     . TONSILLECTOMY    . UMBILICAL HERNIA REPAIR N/A 04/15/2013   Procedure: HERNIA REPAIR UMBILICAL ADULT;  Surgeon: Odis Hollingshead, MD;  Location: [place];  Service: General;  Laterality:  N/A;    Social History   Socioeconomic History  . Marital status: Married    Spouse name: Not on file  . Number of children: 4  . Years of education: Not on file  . Highest education level: Not on file  Occupational History  . Occupation: Mining engineer: [employer]  . Financial resource strain: Not on file  . Food insecurity:    Worry: Not on file    Inability: Not on file  . Transportation needs:    Medical: Not on file    Non-medical: Not on file  Tobacco Use  . Smoking status: Former Smoker    Packs/day: 1.00    Years: 25.00    Pack years: 25.00    Types: Cigarettes    Last attempt to quit: 12/05/1993    Years since quitting: 24.7  . Smokeless tobacco: Never Used  Substance and Sexual Activity  . Alcohol use: No  . Drug use: No  . Sexual activity: Yes  Lifestyle  . Physical activity:    Days per week: Not on file    Minutes per session: Not on file  . Stress: Not on file  Relationships  . Social connections:    Talks on phone: Not on file    Gets together: Not on file    Attends religious service:  Not on file    Active member of club or organization: Not on file    Attends meetings of clubs or organizations: Not on file    Relationship status: Not on file  . Intimate partner violence:    Fear of current or ex partner: Not on file    Emotionally abused: Not on file    Physically abused: Not on file    Forced sexual activity: Not on file  Other Topics Concern  . Not on file  Social History Narrative   Lives with wife in a one story home.  Has 4 children.  Works as a Theme park manager.     Education: 10th grade.     Current Outpatient Medications on File Prior to Visit  Medication Sig Dispense Refill  . aspirin 81 MG tablet Take 81 mg by mouth daily.    Marland Kitchen lactulose (CHRONULAC) 10 GM/15ML solution TAKE 30ML'S BY MOUTH 3 TIMES A DAY 1892 mL 11  . Multiple Vitamins-Minerals (CENTRUM MEN PO) Take by mouth daily.    . pantoprazole  (PROTONIX) 40 MG tablet TAKE 1 TABLET BY MOUTH EVERY DAY 90 tablet 2  . PRESCRIPTION MEDICATION Inject 0.25-0.4 mLs into the skin daily as needed (erectile dysfunction). Penile injection compounded at  [place]. Trimixture of Prostaglandin E-1, Papaverine, and Phentolamine.    Marland Kitchen amLODipine (NORVASC) 5 MG tablet TAKE 1 TABLET (5 MG TOTAL) BY MOUTH DAILY. 90 tablet 3  . linaclotide (LINZESS) 290 MCG CAPS capsule Take 1 capsule (290 mcg total) by mouth daily before breakfast. (Patient not taking: Reported on 08/29/2018) 30 capsule 11  . ondansetron (ZOFRAN) 4 MG tablet Take 1 tablet (4 mg total) by mouth every 6 (six) hours as needed for nausea. (Patient not taking: Reported on 08/01/2018) 20 tablet 0  . pravastatin (PRAVACHOL) 40 MG tablet TAKE 1 TABLET (40 MG TOTAL) BY MOUTH EVERY EVENING. 90 tablet 3  . [DISCONTINUED] vardenafil (LEVITRA) 20 MG tablet Take 20 mg by mouth as needed.       No current facility-administered medications on file prior to visit.     Allergies  Allergen Reactions  . Sulfonamide Derivatives Other (See Comments)     blisters on skin    Family History  Problem Relation Age of Onset  . Ovarian cancer Mother   . Cancer Mother   . Colon cancer Mother   . High Cholesterol Mother   . Stroke Father   . Lung cancer Maternal Uncle   . Lung cancer Maternal Aunt   . Esophageal cancer Neg Hx   . Stomach cancer Neg Hx   . Rectal cancer Neg Hx     BP 131/77 (BP Location: Left Arm)   Pulse (!) 101   Ht 6\' 1"  (1.854 m)   Wt 197 lb 9.6 oz (89.6 kg)   BMI 26.07 kg/m    Review of Systems Denies fever and sob.  He has non-exertional pain at the lat aspects of the abd and chest--worse with lying supine.        Objective:   Physical Exam VITAL SIGNS:  See vs page.  GENERAL: no distress.  head: no deformity.   eyes: no periorbital swelling, no proptosis.   external nose and ears are normal  mouth: no lesion seen.   Both eac's and tm's are normal.   LUNGS:   Clear to auscultation.   HEART:  Regular rate and rhythm without murmurs noted. Normal S1,S2.   ABDOMEN: abdomen is soft, nontender.  no  hepatosplenomegaly.  not distended.  no hernia.   I personally reviewed electrocardiogram tracing (today): Indication: chest pain Impression: NSR.  Poss old AMI, but prob not.  No hypertrophy. Compared to 04/11/18: no significant change     Treadmill was normal in 2018    Assessment & Plan:  Sore throat, new, poss due to GERD Chest pain: new.  noncardiogenic.  Patient Instructions  X-rays are requested for you today.  We'll let you know about the results.  Please resume the ranitidine and sucalfate.  I have sent a prescription to your pharmacy, to refill these.   I hope you feel better soon.  If you don't feel better by next week, please call back.  Please call sooner if you get worse.

## 2018-08-29 NOTE — Patient Instructions (Addendum)
X-rays are requested for you today.  We'll let you know about the results.  Please resume the ranitidine and sucalfate.  I have sent a prescription to your pharmacy, to refill these.   I hope you feel better soon.  If you don't feel better by next week, please call back.  Please call sooner if you get worse.

## 2018-08-30 ENCOUNTER — Telehealth: Payer: Self-pay | Admitting: Endocrinology

## 2018-08-30 NOTE — Telephone Encounter (Signed)
I sent result via my chart

## 2018-08-30 NOTE — Telephone Encounter (Signed)
Pt wanting to know results of his xrays he had done yesterday, please call pt.

## 2018-08-30 NOTE — Telephone Encounter (Signed)
Please advise 

## 2018-08-31 ENCOUNTER — Telehealth: Payer: Self-pay | Admitting: Endocrinology

## 2018-08-31 ENCOUNTER — Encounter: Payer: Medicare Other | Admitting: Endocrinology

## 2018-08-31 NOTE — Telephone Encounter (Signed)
Pt calling to get results of his xray. Please call

## 2018-08-31 NOTE — Telephone Encounter (Signed)
lft vm letting pt know that Dr. Loanne Drilling stated that results are available on mychart.

## 2018-09-03 NOTE — Telephone Encounter (Signed)
Pt aware of results 

## 2018-09-03 NOTE — Telephone Encounter (Signed)
Pt called today to check on results of xray--let him know its normal and we hope he feels better soon

## 2018-09-13 ENCOUNTER — Ambulatory Visit: Payer: Medicare Other | Admitting: Endocrinology

## 2018-10-01 ENCOUNTER — Encounter: Payer: Self-pay | Admitting: Gastroenterology

## 2018-10-01 ENCOUNTER — Ambulatory Visit (INDEPENDENT_AMBULATORY_CARE_PROVIDER_SITE_OTHER): Payer: Medicare Other | Admitting: Otolaryngology

## 2018-10-01 DIAGNOSIS — R070 Pain in throat: Secondary | ICD-10-CM

## 2018-10-11 ENCOUNTER — Ambulatory Visit: Payer: Self-pay | Admitting: *Deleted

## 2018-10-11 ENCOUNTER — Telehealth: Payer: Self-pay | Admitting: Endocrinology

## 2018-10-11 NOTE — Telephone Encounter (Signed)
Pt called in earlier asking for OTC suggestions for constipation. Pt called to state he has had a BM.  He states that it is suggested that he needs some bowel surgery. Pt wanted to cancel his appointment and just call a specialist. Pt was advised to keep appointment which is scheduled tomorrow and allow the doctor to refer him if needed. Pt agreed to keep his appointment. Appointment time reviewed with patient. No triage needed.

## 2018-10-11 NOTE — Telephone Encounter (Signed)
Returned call. Wife states she is using the phone and pt will need to call back. Record reviewed. Pt saw Dr. Silverio Decamp in the past. Please call [facility] with any questions he may have.

## 2018-10-11 NOTE — Telephone Encounter (Signed)
Per [facility] "Caller states he would like a call back from the office to find out which GI doctor he was referred to a couple of years ago by Dr. Loanne Drilling."

## 2018-10-11 NOTE — Telephone Encounter (Signed)
  Reason for Disposition . [1] Follow-up call to recent contact AND [2] information only call, no triage required  Answer Assessment - Initial Assessment Questions 1. REASON FOR CALL or QUESTION: "What is your reason for calling today?" or "How can I best help you?" or "What question do you have that I can help answer?"     Constipation that has resolved.  Protocols used: INFORMATION ONLY CALL-A-AH

## 2018-10-11 NOTE — Telephone Encounter (Signed)
Patient called back and requested information regarding the GI doctor that he called about earlier.  I provided the patient the name of practice and phone number.

## 2018-10-11 NOTE — Telephone Encounter (Signed)
Returned call to patient. Left VM to call us at his convenience.

## 2018-10-12 ENCOUNTER — Ambulatory Visit: Payer: Medicare Other | Admitting: Family Medicine

## 2018-10-15 ENCOUNTER — Encounter: Payer: Self-pay | Admitting: Physician Assistant

## 2018-10-15 ENCOUNTER — Ambulatory Visit (INDEPENDENT_AMBULATORY_CARE_PROVIDER_SITE_OTHER): Payer: Medicare Other | Admitting: Physician Assistant

## 2018-10-15 VITALS — BP 116/70 | HR 84 | Ht 73.0 in | Wt 198.6 lb

## 2018-10-15 DIAGNOSIS — K5909 Other constipation: Secondary | ICD-10-CM | POA: Diagnosis not present

## 2018-10-15 DIAGNOSIS — Z9049 Acquired absence of other specified parts of digestive tract: Secondary | ICD-10-CM

## 2018-10-15 DIAGNOSIS — Z86010 Personal history of colonic polyps: Secondary | ICD-10-CM | POA: Diagnosis not present

## 2018-10-15 DIAGNOSIS — D374 Neoplasm of uncertain behavior of colon: Secondary | ICD-10-CM

## 2018-10-15 NOTE — Patient Instructions (Addendum)
Continue Miralax 17 grams daily in 8 oz of water daily. Try Amitiza 24 mcg, take 1 tablet twice daily for constipation. If the samples help, call back for a prescription.  Continue Lactulose 20 cc's 3 times daily.   You have been scheduled for a colonoscopy. Please follow written instructions given to you at your visit today.  Please pick up your prep supplies at the pharmacy within the next 1-3 days. If you use inhalers (even only as needed), please bring them with you on the day of your procedure. Your physician has requested that you go to www.startemmi.com and enter the access code given to you at your visit today. This web site gives a general overview about your procedure. However, you should still follow specific instructions given to you by our office regarding your preparation for the procedure.  Normal BMI (Body Mass Index- based on height and weight) is between 23 and 30. Your BMI today is Body mass index is 26.2 kg/m. Marland Kitchen Please consider follow up  regarding your BMI with your Primary Care Provider.

## 2018-10-16 ENCOUNTER — Encounter: Payer: Self-pay | Admitting: Physician Assistant

## 2018-10-16 NOTE — Progress Notes (Signed)
Subjective:    Patient ID: Omar Carbon., male    DOB: 1946-06-09, 72 y.o.   MRN: [id number]  HPI Omar [name] is a pleasant 72 year old African-American male known to Dr. Silverio Decamp comes in today with concerns about worsening constipation. Patient has prior history of pulmonary embolus, history of coronary artery disease, GERD with prior stricture history of prostate cancer and family history of colon cancer in his mother. He had undergone colonoscopy in August 2018 per Dr. Silverio Decamp  and was found to have a 2.5 cm polyp in the cecum spreading into the appendiceal orifice laterally..  This lesion was biopsied and found to be a tubovillous adenoma with high-grade dysplasia and He also had to small sessile polyps removed which were tubular adenomas and noted to have scattered diverticuli.  Exline Patient was referred to surgery and underwent laparoscopic assisted right hemicolectomy in September 2018 per Dr. Hassell Done.  Surgical specimen showed a tubo villous adenoma with focal high-grade dysplasia, 25 lymph nodes were benign. Patient states that he has had chronic constipation for many years He has been on lactulose 20 cc 3 times daily for a Grime time.  He also has been on Linzess 290 mcg daily.  He says even with both of these, most of the time he will  have a bowel movement every 3 to 4 days..  Most the time he also drinks MiraLAX made into a tea every evening.  He says he has not been real diligent with that lately. He is not aware of any melena or hematochezia, he has no current complaints of abdominal pain.  Appetite has been fine, weight has been stable and he states that he drinks a lot of water every day.  Review of Systems Pertinent positive and negative review of systems were noted in the above HPI section.  All other review of systems was otherwise negative.  Outpatient Encounter Medications as of 10/15/2018  Medication Sig  . aspirin 81 MG tablet Take 81 mg by mouth daily.  Marland Kitchen lactulose  (CHRONULAC) 10 GM/15ML solution TAKE 30ML'S BY MOUTH 3 TIMES A DAY  . Multiple Vitamins-Minerals (CENTRUM MEN PO) Take by mouth daily.  . pantoprazole (PROTONIX) 40 MG tablet TAKE 1 TABLET BY MOUTH EVERY DAY  . pravastatin (PRAVACHOL) 40 MG tablet TAKE 1 TABLET (40 MG TOTAL) BY MOUTH EVERY EVENING.  . [DISCONTINUED] amLODipine (NORVASC) 5 MG tablet TAKE 1 TABLET (5 MG TOTAL) BY MOUTH DAILY.  . [DISCONTINUED] linaclotide (LINZESS) 290 MCG CAPS capsule Take 1 capsule (290 mcg total) by mouth daily before breakfast. (Patient not taking: Reported on 08/29/2018)  . [DISCONTINUED] ondansetron (ZOFRAN) 4 MG tablet Take 1 tablet (4 mg total) by mouth every 6 (six) hours as needed for nausea.  . [DISCONTINUED] PRESCRIPTION MEDICATION Inject 0.25-0.4 mLs into the skin daily as needed (erectile dysfunction). Penile injection compounded at  [facility]. Trimixture of Prostaglandin E-1, Papaverine, and Phentolamine.  . [DISCONTINUED] ranitidine (ZANTAC) 150 MG tablet Take 1 tablet (150 mg total) by mouth 2 (two) times daily.  . [DISCONTINUED] sucralfate (CARAFATE) 1 GM/10ML suspension Take 10 mLs (1 g total) by mouth 4 (four) times daily -  with meals and at bedtime.  . [DISCONTINUED] vardenafil (LEVITRA) 20 MG tablet Take 20 mg by mouth as needed.     No facility-administered encounter medications on file as of 10/15/2018.    Allergies  Allergen Reactions  . Sulfonamide Derivatives Other (See Comments)     blisters on skin   Patient Active  Problem List   Diagnosis Date Noted  . Left flank pain 08/01/2018  . Paresthesia of both feet 08/01/2018  . 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 S/P right colectomy 08/31/2017  . Neuropathy 11/21/2016  . Screening for malignant neoplasm of prostate 09/13/2016  . 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 Cold intolerance 11/15/2015  . DOE (dyspnea on exertion) 07/18/2014  . History of pulmonary  embolism 07/18/2014  . 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 Coronary atherosclerosis of native coronary artery 03/18/2014  . Acute pulmonary embolism, main risk factor Skow car rides 11/13/2013  . Hyperlipidemia 11/13/2013  . 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 Umbilical hernia 37/90/2409  . Prostate cancer ([place]) 11/09/2012  . Stricture and stenosis of esophagus 05/31/2012  . LEUKOPENIA, MILD 05/27/2009  . Esophageal reflux 10/09/2007  . Dyslipidemia 07/16/2007  . Depression 07/16/2007  . COLONIC POLYPS, HX OF 07/16/2007   Social History   Socioeconomic History  . Marital status: Married    Spouse name: Not on file  . Number of children: 4  . Years of education: Not on file  . Highest education level: Not on file  Occupational History  . Occupation: Mining engineer: [employer]  . Financial resource strain: Not on file  . Food insecurity:    Worry: Not on file    Inability: Not on file  . Transportation needs:    Medical: Not on file    Non-medical: Not on file  Tobacco Use  . Smoking status: Former Smoker    Packs/day: 1.00    Years: 25.00    Pack years: 25.00    Types: Cigarettes    Last attempt to quit: 12/05/1993    Years since quitting: 24.8  . Smokeless tobacco: Never Used  Substance and Sexual Activity  . Alcohol use: No  . Drug use: No  . Sexual activity: Yes  Lifestyle  . Physical activity:    Days per week: Not on file    Minutes per session: Not on file  . Stress: Not on file  Relationships  . Social connections:    Talks on phone: Not on file    Gets together: Not on file    Attends religious service: Not on file    Active member of club or organization: Not on file    Attends meetings of clubs or organizations: Not on file    Relationship status: Not on file  . Intimate partner violence:    Fear of current or ex partner: Not on file    Emotionally abused: Not on file    Physically abused: Not on file    Forced  sexual activity: Not on file  Other Topics Concern  . Not on file  Social History Narrative   Lives with wife in a one story home.  Has 4 children.  Works as a Theme park manager.     Education: 10th grade.     Mr. Tanori family history includes Colon cancer (age of onset: 58) in his mother; High Cholesterol in his mother; Lung cancer in his maternal aunt and maternal uncle; Ovarian cancer in his mother; Stroke in his father.      Objective:    Vitals:   10/15/18 1455  BP: 116/70  Pulse: 84    Physical Exam; Well-developed older African-American male in no acute distress, pleasant blood pressure 116/70 pulse 84, height 6 foot 1, weight 198, BMI  26.2.  HEENT ;nontraumatic normocephalic EOMI PERRLA sclera anicteric oral mucosa moist, Cardiovascular; regular rate and rhythm with S1-S2 no murmur rub or gallop, Pulmonary; clear bilaterally, Abdomen; soft, nontender, nondistended no palpable mass or hepatosplenomegaly bowel sounds are present, Rectal; exam not done, Extremities; no clubbing cyanosis or edema skin warm dry, Neuro psych; alert and oriented, grossly nonfocal mood and affect appropriate       Assessment & Plan:   #81 72 year old African-American male with severe chronic constipation, poorly controlled with current regimen of Linzess 290 mcg daily, and lactulose 20 cc 3 times daily and most days MiraLAX 17 g  #2 status post lap assisted right hemicolectomy September 2018 for 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  Internal hemorrhoids #6.  History of prostate cancer #7.  Family history of colon cancer in patient's mother #8.  GERD with prior stricture been stable on Protonix  #9 coronary artery disease 10.  Prior history of PE  Plan; Patient is due for 1 year follow-up colonoscopy with Dr. Silverio Decamp.  Procedure was discussed with the patient in detail including  indications ,risks, and benefits and he is agreeable to proceed. For constipation give him a trial of Amitiza 24 mcg twice daily instead of Linzess.  He was given samples today and is asked to call the office if this is helpful and we will send a prescription. In addition he will continue MiraLAX 17 g in 8 ounces of water every day, and was advised that he can increase to 2 doses daily if needed. For now he will also continue lactulose 20 cc 3 times daily.  Hopefully the Amitiza will be helpful he may be able to come off the lactulose.  Allanah Mcfarland S Rogue Rafalski PA-C 10/16/2018   Cc: [place]

## 2018-10-16 NOTE — Progress Notes (Signed)
Reviewed and agree with documentation and assessment and plan. K. Veena  , MD   

## 2018-10-25 ENCOUNTER — Ambulatory Visit (AMBULATORY_SURGERY_CENTER): Payer: Medicare Other | Admitting: Gastroenterology

## 2018-10-25 ENCOUNTER — Encounter: Payer: Self-pay | Admitting: Gastroenterology

## 2018-10-25 VITALS — BP 116/79 | HR 67 | Temp 97.8°F | Resp 11 | Ht 73.0 in | Wt 198.0 lb

## 2018-10-25 DIAGNOSIS — Z86010 Personal history of colonic polyps: Secondary | ICD-10-CM

## 2018-10-25 DIAGNOSIS — Z1211 Encounter for screening for malignant neoplasm of colon: Secondary | ICD-10-CM | POA: Diagnosis not present

## 2018-10-25 MED ORDER — SODIUM CHLORIDE 0.9 % IV SOLN: 500.0000 mL | Freq: Once | INTRAVENOUS | Status: DC

## 2018-10-25 NOTE — Op Note (Addendum)
[place] [facility] Patient Name: Omar [name] Procedure Date: 10/25/2018 9:58 AM MRN: [id number] Endoscopist: Mauri Pole , MD Age: 72 Referring MD:  Date of Birth: 1946-07-06 Gender: Male Account #: [id number] Procedure:                Colonoscopy Indications:              High risk colon cancer surveillance: Personal                            history of adenoma (10 mm or greater in size), High                            risk colon cancer surveillance: Personal history of                            adenoma with high grade dysplasia, High risk colon                            cancer surveillance: Personal history of adenoma                            with villous component s/p limited cecectomy. Medicines:                Monitored Anesthesia Care Procedure:                Pre-Anesthesia Assessment:                           - Prior to the procedure, a History and Physical                            was performed, and patient medications and                            allergies were reviewed. The patient's tolerance of                            previous anesthesia was also reviewed. The risks                            and benefits of the procedure and the sedation                            options and risks were discussed with the patient.                            All questions were answered, and informed consent                            was obtained. Prior Anticoagulants: The patient has                            taken no previous anticoagulant or antiplatelet  agents. ASA Grade Assessment: III - A patient with                            severe systemic disease. After reviewing the risks                            and benefits, the patient was deemed in                            satisfactory condition to undergo the procedure.                           After obtaining informed consent, the colonoscope                            was passed  under direct vision. Throughout the                            procedure, the patient's blood pressure, pulse, and                            oxygen saturations were monitored continuously. The                            Colonoscope was introduced through the anus and                            advanced to the the ileocolonic anastomosis. The                            colonoscopy was technically difficult and complex                            due to inadequate bowel prep. The patient tolerated                            the procedure well. The quality of the bowel                            preparation was adequate to identify polyps 6 mm                            and larger in size. The terminal ileum and the                            rectum were photographed. Findings:                 The perianal and digital rectal examinations were                            normal.                           Multiple small and large-mouthed diverticula were  found in the sigmoid colon and descending colon.                           Non-bleeding internal hemorrhoids were found during                            retroflexion. The hemorrhoids were large. Complications:            No immediate complications. Estimated Blood Loss:     Estimated blood loss was minimal. Estimated blood                            loss: none. Impression:               - No specimens collected. Recommendation:           - Patient has a contact number available for                            emergencies. The signs and symptoms of potential                            delayed complications were discussed with the                            patient. Return to normal activities tomorrow.                            Written discharge instructions were provided to the                            patient.                           - Resume previous diet.                           - Continue present  medications.                           - Repeat colonoscopy in 5 years for surveillance. Mauri Pole, MD 10/25/2018 10:43:57 AM This report has been signed electronically.

## 2018-10-25 NOTE — Progress Notes (Signed)
To [facility] VSS Report to [email or web address] 

## 2018-10-25 NOTE — Patient Instructions (Signed)
YOU HAD AN ENDOSCOPIC PROCEDURE TODAY AT THE [facility]:   Refer to the procedure report that was given to you for any specific questions about what was found during the examination.  If the procedure report does not answer your questions, please call your gastroenterologist to clarify.  If you requested that your care partner not be given the details of your procedure findings, then the procedure report has been included in a sealed envelope for you to review at your convenience later.  YOU SHOULD EXPECT: Some feelings of bloating in the abdomen. Passage of more gas than usual.  Walking can help get rid of the air that was put into your GI tract during the procedure and reduce the bloating. If you had a lower endoscopy (such as a colonoscopy or flexible sigmoidoscopy) you may notice spotting of blood in your stool or on the toilet paper. If you underwent a bowel prep for your procedure, you may not have a normal bowel movement for a few days.  Please Note:  You might notice some irritation and congestion in your nose or some drainage.  This is from the oxygen used during your procedure.  There is no need for concern and it should clear up in a day or so.  SYMPTOMS TO REPORT IMMEDIATELY:   Following lower endoscopy (colonoscopy or flexible sigmoidoscopy):  Excessive amounts of blood in the stool  Significant tenderness or worsening of abdominal pains  Swelling of the abdomen that is new, acute  Fever of 100F or higher  For urgent or emergent issues, a gastroenterologist can be reached at any hour by calling [phone].   DIET:  We do recommend a small meal at first, but then you may proceed to your regular diet.  Drink plenty of fluids but you should avoid alcoholic beverages for 24 hours.  ACTIVITY:  You should plan to take it easy for the rest of today and you should NOT DRIVE or use heavy machinery until tomorrow (because of the sedation medicines used during the test).     FOLLOW UP: Our staff will call the number listed on your records the next business day following your procedure to check on you and address any questions or concerns that you may have regarding the information given to you following your procedure. If we do not reach you, we will leave a message.  However, if you are feeling well and you are not experiencing any problems, there is no need to return our call.  We will assume that you have returned to your regular daily activities without incident.  If any biopsies were taken you will be contacted by phone or by letter within the next 1-3 weeks.  Please call us at [phone] if you have not heard about the biopsies in 3 weeks.    SIGNATURES/CONFIDENTIALITY: You and/or your care partner have signed paperwork which will be entered into your electronic medical record.  These signatures attest to the fact that that the information above on your After Visit Summary has been reviewed and is understood.  Full responsibility of the confidentiality of this discharge information lies with you and/or your care-partner. 

## 2018-10-26 ENCOUNTER — Telehealth: Payer: Self-pay | Admitting: *Deleted

## 2018-10-26 NOTE — Telephone Encounter (Signed)
  Follow up Call-  Call back number 10/25/2018 07/12/2017  Post procedure Call Back phone  # [phone]  Permission to leave phone message Yes Yes  Some recent data might be hidden     Patient questions:  Do you have a fever, pain , or abdominal swelling? No. Pain Score  0 *  Have you tolerated food without any problems? Yes.    Have you been able to return to your normal activities? Yes.    Do you have any questions about your discharge instructions: Diet   No. Medications  No. Follow up visit  No.  Do you have questions or concerns about your Care? No.  Actions: * If pain score is 4 or above: No action needed, pain <4.

## 2018-11-05 ENCOUNTER — Ambulatory Visit (INDEPENDENT_AMBULATORY_CARE_PROVIDER_SITE_OTHER): Payer: Medicare Other | Admitting: Otolaryngology

## 2018-11-05 DIAGNOSIS — R070 Pain in throat: Secondary | ICD-10-CM | POA: Diagnosis not present

## 2018-11-05 DIAGNOSIS — K219 Gastro-esophageal reflux disease without esophagitis: Secondary | ICD-10-CM | POA: Diagnosis not present

## 2018-11-15 DIAGNOSIS — K5900 Constipation, unspecified: Secondary | ICD-10-CM | POA: Diagnosis not present

## 2018-12-06 DIAGNOSIS — C61 Malignant neoplasm of prostate: Secondary | ICD-10-CM | POA: Diagnosis not present

## 2019-01-01 ENCOUNTER — Encounter: Payer: Self-pay | Admitting: Emergency Medicine

## 2019-01-01 ENCOUNTER — Ambulatory Visit (INDEPENDENT_AMBULATORY_CARE_PROVIDER_SITE_OTHER): Payer: Medicare Other | Admitting: Emergency Medicine

## 2019-01-01 ENCOUNTER — Other Ambulatory Visit: Payer: Self-pay

## 2019-01-01 ENCOUNTER — Ambulatory Visit (INDEPENDENT_AMBULATORY_CARE_PROVIDER_SITE_OTHER): Payer: Medicare Other

## 2019-01-01 VITALS — BP 131/79 | HR 82 | Temp 98.4°F | Resp 18 | Ht 73.78 in | Wt 196.8 lb

## 2019-01-01 DIAGNOSIS — K219 Gastro-esophageal reflux disease without esophagitis: Secondary | ICD-10-CM | POA: Diagnosis not present

## 2019-01-01 DIAGNOSIS — L989 Disorder of the skin and subcutaneous tissue, unspecified: Secondary | ICD-10-CM | POA: Diagnosis not present

## 2019-01-01 DIAGNOSIS — R634 Abnormal weight loss: Secondary | ICD-10-CM | POA: Insufficient documentation

## 2019-01-01 DIAGNOSIS — R0600 Dyspnea, unspecified: Secondary | ICD-10-CM

## 2019-01-01 DIAGNOSIS — R0602 Shortness of breath: Secondary | ICD-10-CM | POA: Diagnosis not present

## 2019-01-01 LAB — POCT CBC
Granulocyte percent: 65.7 %G (ref 37–80)
HCT, POC: 44.7 % — AB (ref 29–41)
HEMOGLOBIN: 14.8 g/dL — AB (ref 11–14.6)
Lymph, poc: 0.7 (ref 0.6–3.4)
MCH: 26.9 pg — AB (ref 27–31.2)
MCHC: 33.1 g/dL (ref 31.8–35.4)
MCV: 81.3 fL (ref 76–111)
MID (cbc): 0.6 (ref 0–0.9)
MPV: 6.7 fL (ref 0–99.8)
PLATELET COUNT, POC: 215 10*3/uL (ref 142–424)
POC Granulocyte: 2.4 (ref 2–6.9)
POC LYMPH PERCENT: 17.8 %L (ref 10–50)
POC MID %: 16.5 %M — AB (ref 0–12)
RBC: 5.5 M/uL (ref 4.69–6.13)
RDW, POC: 13.6 %
WBC: 3.7 10*3/uL — AB (ref 4.6–10.2)

## 2019-01-01 NOTE — Assessment & Plan Note (Addendum)
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  Recurrent malignancy has to be ruled out.  GERD symptoms warrant upper endoscopy.  Patient will follow-up with [facility], same group that did his colonoscopy.

## 2019-01-01 NOTE — Progress Notes (Deleted)
Omar [name]. 73 y.o.   Chief Complaint  Patient presents with  . Blood In Stools    X today -pt states that he's losing weight  . Nevus    X 1 year pt states mole gotten bigger over time  . Heartburn    SOB    HISTORY OF PRESENT ILLNESS: This is a 73 y.o. male complaining of ***. ***.  HPI   Prior to Admission medications   Medication Sig Start Date End Date Taking? Authorizing Provider  amLODipine (NORVASC) 5 MG tablet Take 5 mg by mouth once. 10/25/18  Yes [provider]  aspirin 81 MG tablet Take 81 mg by mouth daily.   Yes [provider]  lactulose (CHRONULAC) 10 GM/15ML solution TAKE 30ML'S BY MOUTH 3 TIMES A DAY 07/31/18  Yes Renato Shin, MD  Multiple Vitamins-Minerals (CENTRUM MEN PO) Take by mouth daily.   Yes [provider]  pantoprazole (PROTONIX) 40 MG tablet TAKE 1 TABLET BY MOUTH EVERY DAY 02/06/18  Yes Renato Shin, MD  pravastatin (PRAVACHOL) 40 MG tablet TAKE 1 TABLET (40 MG TOTAL) BY MOUTH EVERY EVENING. 01/18/18 10/16/19 Yes Lelon Perla, MD  vardenafil (LEVITRA) 20 MG tablet Take 20 mg by mouth as needed.    02/29/12  [provider]    Allergies  Allergen Reactions  . Sulfonamide Derivatives Other (See Comments)     blisters on skin    Patient Active Problem List   Diagnosis Date Noted  . Left flank pain 08/01/2018  . Paresthesia of both feet 08/01/2018  . 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 S/P right colectomy 08/31/2017  . Neuropathy 11/21/2016  . Screening for malignant neoplasm of prostate 09/13/2016  . 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 Cold intolerance 11/15/2015  . DOE (dyspnea on exertion) 07/18/2014  . History of pulmonary embolism 07/18/2014  . 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 Coronary atherosclerosis of native coronary artery 03/18/2014  . Acute pulmonary embolism, main risk factor [place] car rides 11/13/2013  . Hyperlipidemia  11/13/2013  . 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 Umbilical hernia 77/82/4235  . Prostate cancer ([place]) 11/09/2012  . Stricture and stenosis of esophagus 05/31/2012  . LEUKOPENIA, MILD 05/27/2009  . Esophageal reflux 10/09/2007  . Dyslipidemia 07/16/2007  . Depression 07/16/2007  . COLONIC POLYPS, HX OF 07/16/2007    Past Medical History:  Diagnosis Date  . Anxiety   . BPH (benign prostatic hypertrophy)   . Decreased white blood cell count   . Depression   . Diverticulosis   . Erectile dysfunction   . Gastric polyps   . GERD (gastroesophageal reflux disease)   . History of colonic polyps   . History of esophageal stricture 10/31/2007  . History of kidney stones   . History of prostate surgery   . History of rheumatic fever   . Hyperlipidemia   . Hypertension   . Prostate cancer ([place]) 11/09/2012  . Prostatism     Past Surgical History:  Procedure Laterality Date  . CARPAL TUNNEL RELEASE     Bilateral  . ESOPHAGEAL DILATION     last dilation 8'12  . INGUINAL HERNIA REPAIR    . INSERTION OF MESH N/A 04/15/2013   Procedure: INSERTION OF MESH;  Surgeon: Odis Hollingshead, MD;  Location: [place];  Service: General;  Laterality: N/A;  . KNEE ARTHROSCOPY     left  . LAPAROSCOPIC RIGHT HEMI COLECTOMY Right 08/31/2017  Procedure: LAPAROSCOPIC ASSISTED  RIGHT HEMI COLECTOMY;  Surgeon: Johnathan Hausen, MD;  Location[facility];  Service: General;  Laterality: Right;  . LEFT HEART CATHETERIZATION WITH CORONARY ANGIOGRAM N/A 07/18/2014   Procedure: LEFT HEART CATHETERIZATION WITH CORONARY ANGIOGRAM;  Surgeon: Burnell Blanks, MD;  Location: [facility] CATH [facility];  Service: Cardiovascular;  Laterality: N/A;  . ROBOT ASSISTED LAPAROSCOPIC RADICAL PROSTATECTOMY  11/08/2012   Procedure: ROBOTIC ASSISTED LAPAROSCOPIC RADICAL PROSTATECTOMY LEVEL 1;  Surgeon: Molli Hazard, MD;  Location: [facility];  Service: Urology;  Laterality: N/A;     . TONSILLECTOMY    . UMBILICAL HERNIA REPAIR N/A  04/15/2013   Procedure: HERNIA REPAIR UMBILICAL ADULT;  Surgeon: Odis Hollingshead, MD;  Location: [place];  Service: General;  Laterality: N/A;    Social History   Socioeconomic History  . Marital status: Married    Spouse name: Not on file  . Number of children: 4  . Years of education: Not on file  . Highest education level: Not on file  Occupational History  . Occupation: Mining engineer: [employer]  . Financial resource strain: Not on file  . Food insecurity:    Worry: Not on file    Inability: Not on file  . Transportation needs:    Medical: Not on file    Non-medical: Not on file  Tobacco Use  . Smoking status: Former Smoker    Packs/day: 1.00    Years: 25.00    Pack years: 25.00    Types: Cigarettes    Last attempt to quit: 12/05/1993    Years since quitting: 25.0  . Smokeless tobacco: Never Used  Substance and Sexual Activity  . Alcohol use: No  . Drug use: No  . Sexual activity: Yes  Lifestyle  . Physical activity:    Days per week: Not on file    Minutes per session: Not on file  . Stress: Not on file  Relationships  . Social connections:    Talks on phone: Not on file    Gets together: Not on file    Attends religious service: Not on file    Active member of club or organization: Not on file    Attends meetings of clubs or organizations: Not on file    Relationship status: Not on file  . Intimate partner violence:    Fear of current or ex partner: Not on file    Emotionally abused: Not on file    Physically abused: Not on file    Forced sexual activity: Not on file  Other Topics Concern  . Not on file  Social History Narrative   Lives with wife in a one story home.  Has 4 children.  Works as a Theme park manager.     Education: 10th grade.     Family History  Problem Relation Age of Onset  . Ovarian cancer Mother   . Colon cancer Mother 48  . High Cholesterol Mother   . Stroke Father   . Lung cancer Maternal Uncle   .  Lung cancer Maternal Aunt   . Esophageal cancer Neg Hx   . Stomach cancer Neg Hx   . Rectal cancer Neg Hx   . Pancreatic cancer Neg Hx   . Liver disease Neg Hx      ROS   Physical Exam   ASSESSMENT & PLAN: ***   Agustina Caroli, MD [facility]

## 2019-01-01 NOTE — Assessment & Plan Note (Signed)
Clinically stable.  Stable vital signs O2 saturation normal.  No tachypnea.  Chest x-ray unremarkable.  Unlikely PE.

## 2019-01-01 NOTE — Progress Notes (Addendum)
Omar [name]. 73 y.o.   Chief Complaint  Patient presents with  . Blood In Stools    X today -pt states that he's losing weight  . Nevus    X 1 year pt states mole gotten bigger over time  . Heartburn    SOB    HISTORY OF PRESENT ILLNESS: This is a 73 y.o. male complaining of several things: 1.  Weight loss about 9 pounds in the past couple months. 2.  GERD: Reflux acting up and Protonix not helping. 3.  One episode of blood in the stools this morning. 4.  Difficulty breathing: Intermittent for several weeks. Colonoscopy done about 3 years ago showed 1 polyp, otherwise unremarkable. 5.  Lesion on his scalp for several months. HPI   Prior to Admission medications   Medication Sig Start Date End Date Taking? Authorizing Provider  amLODipine (NORVASC) 5 MG tablet Take 5 mg by mouth once. 10/25/18  Yes [provider]  aspirin 81 MG tablet Take 81 mg by mouth daily.   Yes [provider]  lactulose (CHRONULAC) 10 GM/15ML solution TAKE 30ML'S BY MOUTH 3 TIMES A DAY 07/31/18  Yes Renato Shin, MD  Multiple Vitamins-Minerals (CENTRUM MEN PO) Take by mouth daily.   Yes [provider]  pantoprazole (PROTONIX) 40 MG tablet TAKE 1 TABLET BY MOUTH EVERY DAY 02/06/18  Yes Renato Shin, MD  pravastatin (PRAVACHOL) 40 MG tablet TAKE 1 TABLET (40 MG TOTAL) BY MOUTH EVERY EVENING. 01/18/18 10/16/19 Yes Lelon Perla, MD  vardenafil (LEVITRA) 20 MG tablet Take 20 mg by mouth as needed.    02/29/12  [provider]    Allergies  Allergen Reactions  . Sulfonamide Derivatives Other (See Comments)     blisters on skin    Patient Active Problem List   Diagnosis Date Noted  . Left flank pain 08/01/2018  . Paresthesia of both feet 08/01/2018  . 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 S/P right colectomy 08/31/2017  . Neuropathy 11/21/2016  . Screening for malignant neoplasm of prostate 09/13/2016  . Hereditary and  idiopathic peripheral neuropathy 09/06/2016  . Cold intolerance 11/15/2015  . DOE (dyspnea on exertion) 07/18/2014  . History of pulmonary embolism 07/18/2014  . 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 Coronary atherosclerosis of native coronary artery 03/18/2014  . Acute pulmonary embolism, main risk factor [place] car rides 11/13/2013  . Hyperlipidemia 11/13/2013  . 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 Umbilical hernia 16/60/6301  . Prostate cancer ([place]) 11/09/2012  . Stricture and stenosis of esophagus 05/31/2012  . LEUKOPENIA, MILD 05/27/2009  . Esophageal reflux 10/09/2007  . Dyslipidemia 07/16/2007  . Depression 07/16/2007  . COLONIC POLYPS, HX OF 07/16/2007    Past Medical History:  Diagnosis Date  . Anxiety   . BPH (benign prostatic hypertrophy)   . Decreased white blood cell count   . Depression   . Diverticulosis   . Erectile dysfunction   . Gastric polyps   . GERD (gastroesophageal reflux disease)   . History of colonic polyps   . History of esophageal stricture 10/31/2007  . History of kidney stones   . History of prostate surgery   . History of rheumatic fever   . Hyperlipidemia   . Hypertension   . Prostate cancer ([place]) 11/09/2012  . Prostatism     Past Surgical History:  Procedure Laterality Date  . CARPAL TUNNEL RELEASE     Bilateral  . ESOPHAGEAL DILATION  last dilation 8'12  . INGUINAL HERNIA REPAIR    . INSERTION OF MESH N/A 04/15/2013   Procedure: INSERTION OF MESH;  Surgeon: Odis Hollingshead, MD;  Location: [place];  Service: General;  Laterality: N/A;  . KNEE ARTHROSCOPY     left  . LAPAROSCOPIC RIGHT HEMI COLECTOMY Right 08/31/2017   Procedure: LAPAROSCOPIC ASSISTED  RIGHT HEMI COLECTOMY;  Surgeon: Johnathan Hausen, MD;  [facility];  Service: General;  Laterality: Right;  . LEFT HEART CATHETERIZATION WITH CORONARY ANGIOGRAM N/A 07/18/2014   Procedure: LEFT HEART CATHETERIZATION WITH CORONARY ANGIOGRAM;  Surgeon: Burnell Blanks, MD;   Location[facility];  Service: Cardiovascular;  Laterality: N/A;  . ROBOT ASSISTED LAPAROSCOPIC RADICAL PROSTATECTOMY  11/08/2012   Procedure: ROBOTIC ASSISTED LAPAROSCOPIC RADICAL PROSTATECTOMY LEVEL 1;  Surgeon: Molli Hazard, MD;  [facility];  Service: Urology;  Laterality: N/A;     . TONSILLECTOMY    . UMBILICAL HERNIA REPAIR N/A 04/15/2013   Procedure: HERNIA REPAIR UMBILICAL ADULT;  Surgeon: Odis Hollingshead, MD;  Location: [place];  Service: General;  Laterality: N/A;    Social History   Socioeconomic History  . Marital status: Married    Spouse name: Not on file  . Number of children: 4  . Years of education: Not on file  . Highest education level: Not on file  Occupational History  . Occupation: Mining engineer: [employer]  . Financial resource strain: Not on file  . Food insecurity:    Worry: Not on file    Inability: Not on file  . Transportation needs:    Medical: Not on file    Non-medical: Not on file  Tobacco Use  . Smoking status: Former Smoker    Packs/day: 1.00    Years: 25.00    Pack years: 25.00    Types: Cigarettes    Last attempt to quit: 12/05/1993    Years since quitting: 25.0  . Smokeless tobacco: Never Used  Substance and Sexual Activity  . Alcohol use: No  . Drug use: No  . Sexual activity: Yes  Lifestyle  . Physical activity:    Days per week: Not on file    Minutes per session: Not on file  . Stress: Not on file  Relationships  . Social connections:    Talks on phone: Not on file    Gets together: Not on file    Attends religious service: Not on file    Active member of club or organization: Not on file    Attends meetings of clubs or organizations: Not on file    Relationship status: Not on file  . Intimate partner violence:    Fear of current or ex partner: Not on file    Emotionally abused: Not on file    Physically abused: Not on file    Forced sexual activity: Not on file  Other  Topics Concern  . Not on file  Social History Narrative   Lives with wife in a one story home.  Has 4 children.  Works as a Theme park manager.     Education: 10th grade.     Family History  Problem Relation Age of Onset  . Ovarian cancer Mother   . Colon cancer Mother 76  . High Cholesterol Mother   . Stroke Father   . Lung cancer Maternal Uncle   . Lung cancer Maternal Aunt   . Esophageal cancer Neg Hx   .  Stomach cancer Neg Hx   . Rectal cancer Neg Hx   . Pancreatic cancer Neg Hx   . Liver disease Neg Hx      ROS Review of Systems  Constitutional: Positive for weight loss. Negative for chills, fever and malaise/fatigue.  HENT: Negative.  Negative for sore throat.   Eyes: Negative.  Negative for blurred vision and double vision.  Respiratory: Positive for shortness of breath.   Cardiovascular: Negative.  Negative for chest pain and palpitations.  Gastrointestinal: Positive for abdominal pain, blood in stool and heartburn. Negative for melena, nausea and vomiting.  Genitourinary: Negative.  Negative for dysuria and hematuria.  Musculoskeletal: Negative for back pain, myalgias and neck pain.  Neurological: Negative.  Negative for dizziness and headaches.  Endo/Heme/Allergies: Negative.   All other systems reviewed and are negative.    Vitals:   01/01/19 1507  BP: 131/79  Pulse: 82  Resp: 18  Temp: 98.4 F (36.9 C)  SpO2: 98%    Physical Exam Physical Exam Vitals signs reviewed.  Constitutional:      Appearance: Normal appearance.  HENT:     Head: Normocephalic and atraumatic.     Nose: Nose normal.     Mouth/Throat:     Mouth: Mucous membranes are moist.     Pharynx: Oropharynx is clear.  Eyes:     Extraocular Movements: Extraocular movements intact.     Conjunctiva/sclera: Conjunctivae normal.     Pupils: Pupils are equal, round, and reactive to light.  Neck:     Musculoskeletal: Normal range of motion and neck supple.  Cardiovascular:     Rate and Rhythm:  Normal rate and regular rhythm.     Heart sounds: Normal heart sounds.  Pulmonary:     Effort: Pulmonary effort is normal.     Breath sounds: Normal breath sounds.  Abdominal:     General: There is no distension.     Palpations: Abdomen is soft. There is hepatomegaly, splenomegaly and mass.     Tenderness: There is no abdominal tenderness. There is no guarding or rebound.  Musculoskeletal: Normal range of motion.  Lymphadenopathy:     Cervical: No cervical adenopathy.  Skin:    General: Skin is warm and dry.     Capillary Refill: Capillary refill takes less than 2 seconds.     Comments: Cystic lesion on the scalp.  See picture below.  Neurological:     General: No focal deficit present.     Mental Status: He is alert and oriented to person, place, and time.  Psychiatric:        Mood and Affect: Mood normal.        Behavior: Behavior normal.         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  Recurrent malignancy has to be ruled out.  GERD symptoms warrant upper endoscopy.  Patient will follow-up with [facility], same group that did his colonoscopy.  Dyspnea Clinically stable.  Stable vital signs O2 saturation normal.  No tachypnea.  Chest x-ray unremarkable.  Unlikely PE.  Myquan was seen today for blood in stools, nevus and heartburn.  Diagnoses and all orders for this visit:  Gastroesophageal reflux disease without esophagitis  Dyspnea, unspecified type -     Comprehensive metabolic panel -     POCT CBC -     DG Chest 2 View; Future  Loss of weight   Advised to follow-up  with [facility] tomorrow.   Patient Instructions       If you have lab work done today you will be contacted with your lab results within the next 2 weeks.  If you have not heard from [place] then please contact us. The fastest way to get your results is to register for My Chart.   IF you received an x-ray today,  you will receive an invoice from [facility]. Please contact [facility] at [phone] with questions or concerns regarding your invoice.   IF you received labwork today, you will receive an invoice from [facility]. Please contact [facility] at [phone] with questions or concerns regarding your invoice.   Our billing staff will not be able to assist you with questions regarding bills from these companies.  You will be contacted with the lab results as soon as they are available. The fastest way to get your results is to activate your My Chart account. Instructions are located on the last page of this paperwork. If you have not heard from [place] regarding the results in 2 weeks, please contact this office.     Gastroesophageal Reflux Disease, Adult Gastroesophageal reflux (GER) happens when acid from the stomach flows up into the tube that connects the mouth and the stomach (esophagus). Normally, food travels down the esophagus and stays in the stomach to be digested. With GER, food and stomach acid sometimes move back up into the esophagus. You may have a disease called gastroesophageal reflux disease (GERD) if the reflux:  Happens often.  Causes frequent or very bad symptoms.  Causes problems such as damage to the esophagus. When this happens, the esophagus becomes sore and swollen (inflamed). Over time, GERD can make small holes (ulcers) in the lining of the esophagus. What are the causes? This condition is caused by a problem with the muscle between the esophagus and the stomach. When this muscle is weak or not normal, it does not close properly to keep food and acid from coming back up from the stomach. The muscle can be weak because of:  Tobacco use.  Pregnancy.  Having a certain type of hernia (hiatal hernia).  Alcohol use.  Certain foods and drinks, such as coffee, chocolate, onions, and peppermint. What increases the risk? You are more likely to develop this  condition if you:  Are overweight.  Have a disease that affects your connective tissue.  Use NSAID medicines. What are the signs or symptoms? Symptoms of this condition include:  Heartburn.  Difficult or painful swallowing.  The feeling of having a lump in the throat.  A bitter taste in the mouth.  Bad breath.  Having a lot of saliva.  Having an upset or bloated stomach.  Belching.  Chest pain. Different conditions can cause chest pain. Make sure you see your doctor if you have chest pain.  Shortness of breath or noisy breathing (wheezing).  Ongoing (chronic) cough or a cough at night.  Wearing away of the surface of teeth (tooth enamel).  Weight loss. How is this treated? Treatment will depend on how bad your symptoms are. Your doctor may suggest:  Changes to your diet.  Medicine.  Surgery. Follow these instructions at home: Eating and drinking   Follow a diet as told by your doctor. You may need to avoid foods and drinks such as: ? Coffee and tea (with or without caffeine). ? Drinks that contain alcohol. ? Energy drinks and sports drinks. ? Bubbly (carbonated) drinks or sodas. ? Chocolate and  cocoa. ? Peppermint and mint flavorings. ? Garlic and onions. ? Horseradish. ? Spicy and acidic foods. These include peppers, chili powder, curry powder, vinegar, hot sauces, and BBQ sauce. ? Citrus fruit juices and citrus fruits, such as oranges, lemons, and limes. ? Tomato-based foods. These include red sauce, chili, salsa, and pizza with red sauce. ? Fried and fatty foods. These include donuts, french fries, potato chips, and high-fat dressings. ? High-fat meats. These include hot dogs, rib eye steak, sausage, ham, and bacon. ? High-fat dairy items, such as whole milk, butter, and cream cheese.  Eat small meals often. Avoid eating large meals.  Avoid drinking large amounts of liquid with your meals.  Avoid eating meals during the 2-3 hours before  bedtime.  Avoid lying down right after you eat.  Do not exercise right after you eat. Lifestyle   Do not use any products that contain nicotine or tobacco. These include cigarettes, e-cigarettes, and chewing tobacco. If you need help quitting, ask your doctor.  Try to lower your stress. If you need help doing this, ask your doctor.  If you are overweight, lose an amount of weight that is healthy for you. Ask your doctor about a safe weight loss goal. General instructions  Pay attention to any changes in your symptoms.  Take over-the-counter and prescription medicines only as told by your doctor. Do not take aspirin, ibuprofen, or other NSAIDs unless your doctor says it is okay.  Wear loose clothes. Do not wear anything tight around your waist.  Raise (elevate) the head of your bed about 6 inches (15 cm).  Avoid bending over if this makes your symptoms worse.  Keep all follow-up visits as told by your doctor. This is important. Contact a doctor if:  You have new symptoms.  You lose weight and you do not know why.  You have trouble swallowing or it hurts to swallow.  You have wheezing or a cough that keeps happening.  Your symptoms do not get better with treatment.  You have a hoarse voice. Get help right away if:  You have pain in your arms, neck, jaw, teeth, or back.  You feel sweaty, dizzy, or light-headed.  You have chest pain or shortness of breath.  You throw up (vomit) and your throw-up looks like blood or coffee grounds.  You pass out (faint).  Your poop (stool) is bloody or black.  You cannot swallow, drink, or eat. Summary  If a person has gastroesophageal reflux disease (GERD), food and stomach acid move back up into the esophagus and cause symptoms or problems such as damage to the esophagus.  Treatment will depend on how bad your symptoms are.  Follow a diet as told by your doctor.  Take all medicines only as told by your doctor. This  information is not intended to replace advice given to you by your health care provider. Make sure you discuss any questions you have with your health care provider. Document Released: 05/09/2008 Document Revised: 05/30/2018 Document Reviewed: 05/30/2018 Elsevier Interactive Patient Education  2019 [employer]      Agustina Caroli, MD [facility]

## 2019-01-01 NOTE — Patient Instructions (Addendum)
If you have lab work done today you will be contacted with your lab results within the next 2 weeks.  If you have not heard from [place] then please contact us. The fastest way to get your results is to register for My Chart.   IF you received an x-ray today, you will receive an invoice from [facility]. Please contact [facility] at [phone] with questions or concerns regarding your invoice.   IF you received labwork today, you will receive an invoice from [place]. Please contact [facility] at [phone] with questions or concerns regarding your invoice.   Our billing staff will not be able to assist you with questions regarding bills from these companies.  You will be contacted with the lab results as soon as they are available. The fastest way to get your results is to activate your My Chart account. Instructions are located on the last page of this paperwork. If you have not heard from [place] regarding the results in 2 weeks, please contact this office.     Gastroesophageal Reflux Disease, Adult Gastroesophageal reflux (GER) happens when acid from the stomach flows up into the tube that connects the mouth and the stomach (esophagus). Normally, food travels down the esophagus and stays in the stomach to be digested. With GER, food and stomach acid sometimes move back up into the esophagus. You may have a disease called gastroesophageal reflux disease (GERD) if the reflux:  Happens often.  Causes frequent or very bad symptoms.  Causes problems such as damage to the esophagus. When this happens, the esophagus becomes sore and swollen (inflamed). Over time, GERD can make small holes (ulcers) in the lining of the esophagus. What are the causes? This condition is caused by a problem with the muscle between the esophagus and the stomach. When this muscle is weak or not normal, it does not close properly to keep food and acid from coming back up from the stomach. The muscle  can be weak because of:  Tobacco use.  Pregnancy.  Having a certain type of hernia (hiatal hernia).  Alcohol use.  Certain foods and drinks, such as coffee, chocolate, onions, and peppermint. What increases the risk? You are more likely to develop this condition if you:  Are overweight.  Have a disease that affects your connective tissue.  Use NSAID medicines. What are the signs or symptoms? Symptoms of this condition include:  Heartburn.  Difficult or painful swallowing.  The feeling of having a lump in the throat.  A bitter taste in the mouth.  Bad breath.  Having a lot of saliva.  Having an upset or bloated stomach.  Belching.  Chest pain. Different conditions can cause chest pain. Make sure you see your doctor if you have chest pain.  Shortness of breath or noisy breathing (wheezing).  Ongoing (chronic) cough or a cough at night.  Wearing away of the surface of teeth (tooth enamel).  Weight loss. How is this treated? Treatment will depend on how bad your symptoms are. Your doctor may suggest:  Changes to your diet.  Medicine.  Surgery. Follow these instructions at home: Eating and drinking   Follow a diet as told by your doctor. You may need to avoid foods and drinks such as: ? Coffee and tea (with or without caffeine). ? Drinks that contain alcohol. ? Energy drinks and sports drinks. ? Bubbly (carbonated) drinks or sodas. ? Chocolate and cocoa. ? Peppermint and mint flavorings. ? Garlic and onions. ? Horseradish. ?  Spicy and acidic foods. These include peppers, chili powder, curry powder, vinegar, hot sauces, and BBQ sauce. ? Citrus fruit juices and citrus fruits, such as oranges, lemons, and limes. ? Tomato-based foods. These include red sauce, chili, salsa, and pizza with red sauce. ? Fried and fatty foods. These include donuts, french fries, potato chips, and high-fat dressings. ? High-fat meats. These include hot dogs, rib eye steak,  sausage, ham, and bacon. ? High-fat dairy items, such as whole milk, butter, and cream cheese.  Eat small meals often. Avoid eating large meals.  Avoid drinking large amounts of liquid with your meals.  Avoid eating meals during the 2-3 hours before bedtime.  Avoid lying down right after you eat.  Do not exercise right after you eat. Lifestyle   Do not use any products that contain nicotine or tobacco. These include cigarettes, e-cigarettes, and chewing tobacco. If you need help quitting, ask your doctor.  Try to lower your stress. If you need help doing this, ask your doctor.  If you are overweight, lose an amount of weight that is healthy for you. Ask your doctor about a safe weight loss goal. General instructions  Pay attention to any changes in your symptoms.  Take over-the-counter and prescription medicines only as told by your doctor. Do not take aspirin, ibuprofen, or other NSAIDs unless your doctor says it is okay.  Wear loose clothes. Do not wear anything tight around your waist.  Raise (elevate) the head of your bed about 6 inches (15 cm).  Avoid bending over if this makes your symptoms worse.  Keep all follow-up visits as told by your doctor. This is important. Contact a doctor if:  You have new symptoms.  You lose weight and you do not know why.  You have trouble swallowing or it hurts to swallow.  You have wheezing or a cough that keeps happening.  Your symptoms do not get better with treatment.  You have a hoarse voice. Get help right away if:  You have pain in your arms, neck, jaw, teeth, or back.  You feel sweaty, dizzy, or light-headed.  You have chest pain or shortness of breath.  You throw up (vomit) and your throw-up looks like blood or coffee grounds.  You pass out (faint).  Your poop (stool) is bloody or black.  You cannot swallow, drink, or eat. Summary  If a person has gastroesophageal reflux disease (GERD), food and stomach acid  move back up into the esophagus and cause symptoms or problems such as damage to the esophagus.  Treatment will depend on how bad your symptoms are.  Follow a diet as told by your doctor.  Take all medicines only as told by your doctor. This information is not intended to replace advice given to you by your health care provider. Make sure you discuss any questions you have with your health care provider. Document Released: 05/09/2008 Document Revised: 05/30/2018 Document Reviewed: 05/30/2018 Elsevier Interactive Patient Education  2019 [employer].

## 2019-01-02 LAB — COMPREHENSIVE METABOLIC PANEL
ALT: 27 IU/L (ref 0–44)
AST: 19 IU/L (ref 0–40)
Albumin/Globulin Ratio: 1.9 (ref 1.2–2.2)
Albumin: 4.3 g/dL (ref 3.7–4.7)
Alkaline Phosphatase: 87 IU/L (ref 39–117)
BUN/Creatinine Ratio: 11 (ref 10–24)
BUN: 12 mg/dL (ref 8–27)
Bilirubin Total: 0.7 mg/dL (ref 0.0–1.2)
CALCIUM: 9.6 mg/dL (ref 8.6–10.2)
CO2: 24 mmol/L (ref 20–29)
Chloride: 100 mmol/L (ref 96–106)
Creatinine, Ser: 1.11 mg/dL (ref 0.76–1.27)
GFR, EST AFRICAN AMERICAN: 76 mL/min/{1.73_m2} (ref >59)
GFR, EST NON AFRICAN AMERICAN: 66 mL/min/{1.73_m2} (ref >59)
GLUCOSE: 83 mg/dL (ref 65–99)
Globulin, Total: 2.3 g/dL (ref 1.5–4.5)
Potassium: 4.2 mmol/L (ref 3.5–5.2)
Sodium: 141 mmol/L (ref 134–144)
TOTAL PROTEIN: 6.6 g/dL (ref 6.0–8.5)

## 2019-01-02 NOTE — Addendum Note (Signed)
Addended by: Davina Poke on: 01/02/2019 08:16 AM   Modules accepted: Orders

## 2019-01-09 DIAGNOSIS — Z23 Encounter for immunization: Secondary | ICD-10-CM | POA: Diagnosis not present

## 2019-01-15 DIAGNOSIS — N393 Stress incontinence (female) (male): Secondary | ICD-10-CM | POA: Diagnosis not present

## 2019-01-15 DIAGNOSIS — N5201 Erectile dysfunction due to arterial insufficiency: Secondary | ICD-10-CM | POA: Diagnosis not present

## 2019-01-15 DIAGNOSIS — C61 Malignant neoplasm of prostate: Secondary | ICD-10-CM | POA: Diagnosis not present

## 2019-01-17 DIAGNOSIS — B078 Other viral warts: Secondary | ICD-10-CM | POA: Diagnosis not present

## 2019-01-17 DIAGNOSIS — L7211 Pilar cyst: Secondary | ICD-10-CM | POA: Diagnosis not present

## 2019-01-20 ENCOUNTER — Other Ambulatory Visit: Payer: Self-pay | Admitting: Endocrinology

## 2019-01-20 NOTE — Telephone Encounter (Signed)
Please forward refill request to pt's new primary care provider.  

## 2019-01-23 ENCOUNTER — Other Ambulatory Visit: Payer: Self-pay | Admitting: Cardiology

## 2019-01-23 NOTE — Telephone Encounter (Signed)
Rx(s) sent to pharmacy electronically.  

## 2019-01-25 ENCOUNTER — Other Ambulatory Visit: Payer: Self-pay | Admitting: Endocrinology

## 2019-01-25 NOTE — Telephone Encounter (Signed)
Please forward refill request to pt's new primary care provider.  

## 2019-02-04 ENCOUNTER — Ambulatory Visit: Payer: Medicare Other | Admitting: Gastroenterology

## 2019-02-17 ENCOUNTER — Other Ambulatory Visit: Payer: Self-pay | Admitting: Cardiology

## 2019-03-04 DIAGNOSIS — L7211 Pilar cyst: Secondary | ICD-10-CM | POA: Diagnosis not present

## 2019-03-04 DIAGNOSIS — L928 Other granulomatous disorders of the skin and subcutaneous tissue: Secondary | ICD-10-CM | POA: Diagnosis not present

## 2019-03-19 ENCOUNTER — Ambulatory Visit: Payer: Medicare Other | Admitting: Gastroenterology

## 2019-04-04 ENCOUNTER — Other Ambulatory Visit: Payer: Self-pay

## 2019-04-04 ENCOUNTER — Telehealth (INDEPENDENT_AMBULATORY_CARE_PROVIDER_SITE_OTHER): Payer: Medicare Other | Admitting: Emergency Medicine

## 2019-04-04 ENCOUNTER — Encounter: Payer: Self-pay | Admitting: Emergency Medicine

## 2019-04-04 DIAGNOSIS — E7800 Pure hypercholesterolemia, unspecified: Secondary | ICD-10-CM | POA: Diagnosis not present

## 2019-04-04 DIAGNOSIS — K648 Other hemorrhoids: Secondary | ICD-10-CM | POA: Diagnosis not present

## 2019-04-04 DIAGNOSIS — Z8546 Personal history of malignant neoplasm of prostate: Secondary | ICD-10-CM

## 2019-04-04 DIAGNOSIS — K625 Hemorrhage of anus and rectum: Secondary | ICD-10-CM | POA: Diagnosis not present

## 2019-04-04 DIAGNOSIS — K5790 Diverticulosis of intestine, part unspecified, without perforation or abscess without bleeding: Secondary | ICD-10-CM | POA: Diagnosis not present

## 2019-04-04 DIAGNOSIS — R634 Abnormal weight loss: Secondary | ICD-10-CM

## 2019-04-04 MED ORDER — PRAVASTATIN SODIUM 40 MG PO TABS: ORAL_TABLET | ORAL | 0 refills | Status: DC

## 2019-04-04 MED ORDER — PRAVASTATIN SODIUM 40 MG PO TABS: 40.0000 mg | ORAL_TABLET | Freq: Every day | ORAL | 3 refills | Status: DC

## 2019-04-04 NOTE — Progress Notes (Signed)
Telemedicine Encounter- SOAP NOTE Established Patient  This telephone encounter was conducted with the patient's (or proxy's) verbal consent via audio telecommunications: yes/no: Yes Patient was instructed to have this encounter in a suitably private space; and to only have persons present to whom they give permission to participate. In addition, patient identity was confirmed by use of name plus two identifiers (DOB and address).  I discussed the limitations, risks, security and privacy concerns of performing an evaluation and management service by telephone and the availability of in person appointments. I also discussed with the patient that there may be a patient responsible charge related to this service. The patient expressed understanding and agreed to proceed.  I spent a total of TIME; 0 MIN TO 60 MIN: 15 minutes talking with the patient or their proxy.  No chief complaint on file. Blood in the stools this morning  Subjective   Omar [name]. is a 73 y.o. male established patient. Telephone visit today for evaluation of blood in the stools.  Noticed a large amount of blood in a bowel movement this morning.  Denies pain.  Eating and drinking well.  Denies nausea and vomiting.  Good appetite.  Denies fever or chills.  Colonoscopy done last year showed diverticulosis and internal hemorrhoids.  Denies syncope or lightheadedness.  Has had 10 pound weight loss in the past 2 months.  States he had decreased intake due to stomach and esophageal problems.  No other significant symptoms.  Denies chest pain or trouble breathing.  Denies abdominal pain or rectal pain.  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 08/31/2017 09/02/2017   S/P right colectomy 08/31/2017   Neuropathy 11/21/2016   Screening for malignant neoplasm of prostate 09/13/2016   Hereditary and idiopathic peripheral  neuropathy 09/06/2016   History of pulmonary embolism 07/18/2014   Adrenal adenoma 03/18/2014   Coronary atherosclerosis of native coronary artery 03/18/2014   Hyperlipidemia 11/13/2013   Impotence of organic origin 62/22/9798   Umbilical hernia 92/10/9416   Prostate cancer ([place]) 11/09/2012   Stricture and stenosis of esophagus 05/31/2012   LEUKOPENIA, MILD 05/27/2009   Esophageal reflux 10/09/2007   Dyslipidemia 07/16/2007   Depression 07/16/2007   COLONIC POLYPS, HX OF 07/16/2007    Past Medical History:  Diagnosis Date   Anxiety    BPH (benign prostatic hypertrophy)    Decreased white blood cell count    Depression    Diverticulosis    Erectile dysfunction    Gastric polyps    GERD (gastroesophageal reflux disease)    History of colonic polyps    History of esophageal stricture 10/31/2007   History of kidney stones    History of prostate surgery    History of rheumatic fever    Hyperlipidemia    Hypertension    Prostate cancer ([place]) 11/09/2012   Prostatism     Current Outpatient Medications  Medication Sig Dispense Refill   amLODipine (NORVASC) 5 MG tablet TAKE 1 TABLET BY MOUTH EVERY DAY 90 tablet 0   aspirin 81 MG tablet Take 81 mg by mouth daily.     lactulose (CHRONULAC) 10 GM/15ML solution TAKE 30ML'S BY MOUTH 3 TIMES A DAY 1892 mL 11   OVER THE COUNTER MEDICATION daily.     pravastatin (PRAVACHOL) 40 MG tablet Take 1 tablet (40 mg total) by mouth daily. TAKE 1 TABLET BY  MOUTH EVERY DAY IN THE EVENING 90 tablet 3   Multiple Vitamins-Minerals (CENTRUM MEN PO) Take by mouth daily.     pantoprazole (PROTONIX) 40 MG tablet TAKE 1 TABLET BY MOUTH EVERY DAY (Patient not taking: Reported on 04/04/2019) 90 tablet 2   Current Facility-Administered Medications  Medication Dose Route Frequency Provider Last Rate Last Dose   0.9 %  sodium chloride infusion  500 mL Intravenous Once Nandigam, Venia Minks, MD        Allergies  Allergen  Reactions   Sulfonamide Derivatives Other (See Comments)     blisters on skin    Social History   Socioeconomic History   Marital status: Married    Spouse name: Not on file   Number of children: 4   Years of education: Not on file   Highest education level: Not on file  Occupational History   Occupation: pastor    Employer: [employer]  Social Needs   Financial resource strain: Not on file   Food insecurity:    Worry: Not on file    Inability: Not on file   Transportation needs:    Medical: Not on file    Non-medical: Not on file  Tobacco Use   Smoking status: Former Smoker    Packs/day: 1.00    Years: 25.00    Pack years: 25.00    Types: Cigarettes    Last attempt to quit: 12/05/1993    Years since quitting: 25.3   Smokeless tobacco: Never Used  Substance and Sexual Activity   Alcohol use: No   Drug use: No   Sexual activity: Yes  Lifestyle   Physical activity:    Days per week: Not on file    Minutes per session: Not on file   Stress: Not on file  Relationships   Social connections:    Talks on phone: Not on file    Gets together: Not on file    Attends religious service: Not on file    Active member of club or organization: Not on file    Attends meetings of clubs or organizations: Not on file    Relationship status: Not on file   Intimate partner violence:    Fear of current or ex partner: Not on file    Emotionally abused: Not on file    Physically abused: Not on file    Forced sexual activity: Not on file  Other Topics Concern   Not on file  Social History Narrative   Lives with wife in a one story home.  Has 4 children.  Works as a Theme park manager.     Education: 10th grade.     Review of Systems  Constitutional: Positive for weight loss. Negative for chills, fever and malaise/fatigue.  HENT: Negative for congestion and sore throat.   Respiratory: Negative.  Negative for cough and shortness of breath.   Cardiovascular:  Negative.  Negative for chest pain and palpitations.  Gastrointestinal: Positive for blood in stool and constipation. Negative for abdominal pain, diarrhea, melena, nausea and vomiting.  Genitourinary: Negative.  Negative for flank pain and hematuria.  Musculoskeletal: Negative for back pain and neck pain.  Skin: Negative.  Negative for rash.  Neurological: Negative for dizziness and headaches.  Endo/Heme/Allergies: Negative.   All other systems reviewed and are negative.   Objective   Vitals as reported by the patient: None available There were no vitals filed for this visit. Awake and oriented x3 in no apparent respiratory distress There  are no diagnoses linked to this encounter. Clinically stable.  No red flag signs or symptoms. Diagnoses and all orders for this visit:  Rectal bleeding -     CBC with Differential/Platelet; Future -     Comprehensive metabolic panel; Future  Diverticulosis  Internal hemorrhoids  Loss of weight -     Comprehensive metabolic panel; Future  History of prostate cancer -     PSA; Future  Pure hypercholesterolemia  Other orders -     Discontinue: pravastatin (PRAVACHOL) 40 MG tablet; TAKE 1 TABLET BY MOUTH EVERY DAY IN THE EVENING -     pravastatin (PRAVACHOL) 40 MG tablet; Take 1 tablet (40 mg total) by mouth daily. TAKE 1 TABLET BY MOUTH EVERY DAY IN THE EVENING     I discussed the assessment and treatment plan with the patient. The patient was provided an opportunity to ask questions and all were answered. The patient agreed with the plan and demonstrated an understanding of the instructions.   The patient was advised to call back or seek an in-person evaluation if the symptoms worsen or if the condition fails to improve as anticipated.  I provided 15 minutes of non-face-to-face time during this encounter.  Horald Pollen, MD  Primary Care at [facility]

## 2019-04-04 NOTE — Progress Notes (Signed)
Contacted patient to triage for appointment. Patient states this morning he had a bowel movement saw a small of blood. Patient states the second time it was more blood than usual in the stool, more so than ever before. Patient states he has lost 10-12 pounds within the last 2 to 3 months. Patient states he stays constipated. Also, patient needs a refill for Pravastatin.

## 2019-04-09 ENCOUNTER — Ambulatory Visit (INDEPENDENT_AMBULATORY_CARE_PROVIDER_SITE_OTHER): Payer: Medicare Other | Admitting: Emergency Medicine

## 2019-04-09 ENCOUNTER — Other Ambulatory Visit: Payer: Self-pay

## 2019-04-09 DIAGNOSIS — R634 Abnormal weight loss: Secondary | ICD-10-CM

## 2019-04-09 DIAGNOSIS — K625 Hemorrhage of anus and rectum: Secondary | ICD-10-CM

## 2019-04-09 DIAGNOSIS — Z8546 Personal history of malignant neoplasm of prostate: Secondary | ICD-10-CM | POA: Diagnosis not present

## 2019-04-10 ENCOUNTER — Telehealth: Payer: Self-pay | Admitting: Emergency Medicine

## 2019-04-10 ENCOUNTER — Ambulatory Visit: Payer: Self-pay | Admitting: Emergency Medicine

## 2019-04-10 ENCOUNTER — Encounter: Payer: Self-pay | Admitting: Emergency Medicine

## 2019-04-10 LAB — CBC WITH DIFFERENTIAL/PLATELET
Basophils Absolute: 0.1 10*3/uL (ref 0.0–0.2)
Basos: 2 %
EOS (ABSOLUTE): 0.3 10*3/uL (ref 0.0–0.4)
Eos: 9 %
Hematocrit: 41.8 % (ref 37.5–51.0)
Hemoglobin: 13.9 g/dL (ref 13.0–17.7)
Immature Grans (Abs): 0 10*3/uL (ref 0.0–0.1)
Immature Granulocytes: 0 %
Lymphocytes Absolute: 1.4 10*3/uL (ref 0.7–3.1)
Lymphs: 37 %
MCH: 26.6 pg (ref 26.6–33.0)
MCHC: 33.3 g/dL (ref 31.5–35.7)
MCV: 80 fL (ref 79–97)
Monocytes Absolute: 0.4 10*3/uL (ref 0.1–0.9)
Monocytes: 9 %
Neutrophils Absolute: 1.8 10*3/uL (ref 1.4–7.0)
Neutrophils: 43 %
Platelets: 210 10*3/uL (ref 150–450)
RBC: 5.23 x10E6/uL (ref 4.14–5.80)
RDW: 13.2 % (ref 11.6–15.4)
WBC: 4 10*3/uL (ref 3.4–10.8)

## 2019-04-10 LAB — COMPREHENSIVE METABOLIC PANEL
ALT: 21 IU/L (ref 0–44)
AST: 17 IU/L (ref 0–40)
Albumin/Globulin Ratio: 2.3 — ABNORMAL HIGH (ref 1.2–2.2)
Albumin: 4.1 g/dL (ref 3.7–4.7)
Alkaline Phosphatase: 73 IU/L (ref 39–117)
BUN/Creatinine Ratio: 14 (ref 10–24)
BUN: 16 mg/dL (ref 8–27)
Bilirubin Total: 0.5 mg/dL (ref 0.0–1.2)
CO2: 28 mmol/L (ref 20–29)
Calcium: 9.7 mg/dL (ref 8.6–10.2)
Chloride: 102 mmol/L (ref 96–106)
Creatinine, Ser: 1.12 mg/dL (ref 0.76–1.27)
GFR calc Af Amer: 75 mL/min/{1.73_m2} (ref >59)
GFR calc non Af Amer: 65 mL/min/{1.73_m2} (ref >59)
Globulin, Total: 1.8 g/dL (ref 1.5–4.5)
Glucose: 71 mg/dL (ref 65–99)
Potassium: 4.6 mmol/L (ref 3.5–5.2)
Sodium: 142 mmol/L (ref 134–144)
Total Protein: 5.9 g/dL — ABNORMAL LOW (ref 6.0–8.5)

## 2019-04-10 LAB — PSA: Prostate Specific Ag, Serum: <0.1 ng/mL (ref 0.0–4.0)

## 2019-04-10 NOTE — Telephone Encounter (Signed)
Pt called in again following up on a phone message he left earlier today for Dr. Mitchel Honour to call him back.   He has not heard from Dr. Mitchel Honour.  The agent has the pt on the line.   So I asked her to hold and I would call Primary [facility] on [place] and see what the status of the call is. I spoke with the flow coordinator and she is going to give another note to Dr. Barry Brunner nurse letting him be aware the pt had called back since he had not heard anything.  He is having rectal bleeding which is not a new problem for him so I did not triage him.   (Had a virtual visit with Dr. Mitchel Honour on 04/04/2019 for same issue reason not triaged).  Agent is sending a note also from the pt to [place] that he called back.

## 2019-04-10 NOTE — Telephone Encounter (Signed)
Pt has a tel appt with another facilty, will f/u

## 2019-04-10 NOTE — Telephone Encounter (Signed)
Copied from [place] [id number]. Topic: Quick Communication - See Telephone Encounter >> Apr 10, 2019 12:28 PM Burchel, Abbi R wrote: CRM for notification. See Telephone encounter for: 04/10/19.  Pt states he is still having blood in his stool/rectal bleeding and is concerned.  Pt states he saw msg from Dr Mitchel Honour saying that test results were normal, but would still ike a call. Please call pt to discuss.    [phone]

## 2019-04-11 ENCOUNTER — Encounter: Payer: Self-pay | Admitting: Gastroenterology

## 2019-04-11 ENCOUNTER — Other Ambulatory Visit: Payer: Self-pay

## 2019-04-11 ENCOUNTER — Telehealth: Payer: Self-pay | Admitting: *Deleted

## 2019-04-11 ENCOUNTER — Ambulatory Visit (INDEPENDENT_AMBULATORY_CARE_PROVIDER_SITE_OTHER): Payer: Medicare Other | Admitting: Gastroenterology

## 2019-04-11 VITALS — Ht 75.0 in | Wt 205.0 lb

## 2019-04-11 DIAGNOSIS — K649 Unspecified hemorrhoids: Secondary | ICD-10-CM | POA: Diagnosis not present

## 2019-04-11 DIAGNOSIS — K625 Hemorrhage of anus and rectum: Secondary | ICD-10-CM

## 2019-04-11 DIAGNOSIS — K5904 Chronic idiopathic constipation: Secondary | ICD-10-CM | POA: Diagnosis not present

## 2019-04-11 DIAGNOSIS — R140 Abdominal distension (gaseous): Secondary | ICD-10-CM

## 2019-04-11 DIAGNOSIS — K219 Gastro-esophageal reflux disease without esophagitis: Secondary | ICD-10-CM

## 2019-04-11 MED ORDER — ESOMEPRAZOLE MAGNESIUM 40 MG PO CPDR: 40.0000 mg | DELAYED_RELEASE_CAPSULE | Freq: Every day | ORAL | 3 refills | Status: DC

## 2019-04-11 MED ORDER — LUBIPROSTONE 24 MCG PO CAPS: 24.0000 ug | ORAL_CAPSULE | Freq: Two times a day (BID) | ORAL | 3 refills | Status: DC

## 2019-04-11 NOTE — Progress Notes (Signed)
Omar [name]    [id number]    29-Apr-1946  Primary Care Physician:Sagardia, Ines Bloomer, MD  Referring Physician: Horald Pollen, MD [place], [place] [address]  This service was provided via audio only telemedicine ([employer]) due to [place] 19 pandemic.  Patient location: Home Provider location: Office Used 2 patient identifiers to confirm the correct person. Explained the limitations in evaluation and management via telemedicine. Patient is aware of potential medical charges for this visit.  Patient consented to this virtual visit.  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 C/o small volume bright red blood per rectum with defecation few days ago, on toilet paper when he wiped and also some in the toilet bowel. He is having hard stool and difficulty evacuating, strain at time. BM only once every 2-3 days. Taking Lactulose with no improvement. Also has severe bloating and lower abdominal cramps.  He is also having break through heartburn and acid reflux symptoms.  No weight loss or decreased appetite  Colonoscopy 10/25/2018: Personal h/o tubulovillous adenoma >2cm on prior colonoscopy in 2018 Left sided diverticulosis, internal hemorrhoids otherwise normal exam. Recall colonoscopy in 5 years   Outpatient Encounter Medications as of 04/11/2019  Medication Sig  . amLODipine (NORVASC) 5 MG tablet TAKE 1 TABLET BY MOUTH EVERY DAY  . aspirin 81 MG tablet Take 81 mg by mouth daily.  Marland Kitchen lactulose (CHRONULAC) 10 GM/15ML solution TAKE 30ML'S BY MOUTH 3 TIMES A DAY (Patient taking differently: Once daily)  . OVER THE COUNTER MEDICATION daily.  . pantoprazole (PROTONIX) 40 MG tablet TAKE 1 TABLET BY MOUTH EVERY DAY  . pravastatin  (PRAVACHOL) 40 MG tablet Take 1 tablet (40 mg total) by mouth daily. TAKE 1 TABLET BY MOUTH EVERY DAY IN THE EVENING  . [DISCONTINUED] Multiple Vitamins-Minerals (CENTRUM MEN PO) Take by mouth daily.  . [DISCONTINUED] vardenafil (LEVITRA) 20 MG tablet Take 20 mg by mouth as needed.     Facility-Administered Encounter Medications as of 04/11/2019  Medication  . 0.9 %  sodium chloride infusion    Allergies as of 04/11/2019 - Review Complete 04/04/2019  Allergen Reaction Noted  . Sulfonamide derivatives Other (See Comments) 06/11/2008    Past Medical History:  Diagnosis Date  . Anxiety   . BPH (benign prostatic hypertrophy)   . Decreased white blood cell count   . Depression   . Diverticulosis   . Erectile dysfunction   . Gastric polyps   . GERD (gastroesophageal reflux disease)   . History of colonic polyps   . History of esophageal stricture 10/31/2007  . History of kidney stones   . History of prostate surgery   . History of rheumatic fever   . Hyperlipidemia   . Hypertension   . Prostate cancer ([place]) 11/09/2012  . Prostatism     Past Surgical History:  Procedure Laterality Date  . CARPAL TUNNEL RELEASE     Bilateral  . ESOPHAGEAL DILATION     last dilation 8'12  . INGUINAL HERNIA REPAIR    . INSERTION OF MESH N/A 04/15/2013   Procedure: INSERTION OF MESH;  Surgeon: Odis Hollingshead, MD;  Location: [facility];  Service: General;  Laterality: N/A;  . KNEE ARTHROSCOPY     left  .  LAPAROSCOPIC RIGHT HEMI COLECTOMY Right 08/31/2017   Procedure: LAPAROSCOPIC ASSISTED  RIGHT HEMI COLECTOMY;  Surgeon: Johnathan Hausen, MD;  Location: [facility];  Service: General;  Laterality: Right;  . LEFT HEART CATHETERIZATION WITH CORONARY ANGIOGRAM N/A 07/18/2014   Procedure: LEFT HEART CATHETERIZATION WITH CORONARY ANGIOGRAM;  Surgeon: Burnell Blanks, MD;  Location: [facility];  Service: Cardiovascular;  Laterality: N/A;  . ROBOT ASSISTED LAPAROSCOPIC RADICAL PROSTATECTOMY  11/08/2012    Procedure: ROBOTIC ASSISTED LAPAROSCOPIC RADICAL PROSTATECTOMY LEVEL 1;  Surgeon: Molli Hazard, MD;  Location: [facility];  Service: Urology;  Laterality: N/A;     . TONSILLECTOMY    . UMBILICAL HERNIA REPAIR N/A 04/15/2013   Procedure: HERNIA REPAIR UMBILICAL ADULT;  Surgeon: Odis Hollingshead, MD;  Location: [place];  Service: General;  Laterality: N/A;    Family History  Problem Relation Age of Onset  . Ovarian cancer Mother   . Colon cancer Mother 25  . High Cholesterol Mother   . Stroke Father   . Lung cancer Maternal Uncle   . Lung cancer Maternal Aunt   . Esophageal cancer Neg Hx   . Stomach cancer Neg Hx   . Rectal cancer Neg Hx   . Pancreatic cancer Neg Hx   . Liver disease Neg Hx     Social History   Socioeconomic History  . Marital status: Married    Spouse name: Not on file  . Number of children: 4  . Years of education: Not on file  . Highest education level: Not on file  Occupational History  . Occupation: Mining engineer: [employer]  . Financial resource strain: Not on file  . Food insecurity:    Worry: Not on file    Inability: Not on file  . Transportation needs:    Medical: Not on file    Non-medical: Not on file  Tobacco Use  . Smoking status: Former Smoker    Packs/day: 1.00    Years: 25.00    Pack years: 25.00    Types: Cigarettes    Last attempt to quit: 12/05/1993    Years since quitting: 25.3  . Smokeless tobacco: Never Used  Substance and Sexual Activity  . Alcohol use: No  . Drug use: No  . Sexual activity: Yes  Lifestyle  . Physical activity:    Days per week: Not on file    Minutes per session: Not on file  . Stress: Not on file  Relationships  . Social connections:    Talks on phone: Not on file    Gets together: Not on file    Attends religious service: Not on file    Active member of club or organization: Not on file    Attends meetings of clubs or organizations: Not on file     Relationship status: Not on file  . Intimate partner violence:    Fear of current or ex partner: Not on file    Emotionally abused: Not on file    Physically abused: Not on file    Forced sexual activity: Not on file  Other Topics Concern  . Not on file  Social History Narrative   Lives with wife in a one story home.  Has 4 children.  Works as a Theme park manager.     Education: 10th grade.       Review of systems: Review of Systems as per HPI All other systems reviewed and are negative.  Physical Exam: Vitals were not taken and physical exam was not performed during this virtual visit.  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  Worsening constipation with bloating and lower abdominal cramping Increase dietary fiber and fluid intake Benefiber 1 teaspoon 2-3 times daily with meals Stop Lactulose Amitiza 22mcg twice daily  Small volume BRBPR likely secondary to hemorrhoids Preparation H suppository per rectum at bedtime as needed for next 3-5 days If coninues to have persistent symptoms, will consider hemorrhoidal band ligation  GERD with persistent breakthrough despite Protonix daily Stop Protonix Will switch to Nexium 40mg  daily Antireflux measures If persistent symptoms, will consider EGD  Follow up telemedicine visit in 2-4 weeks    K. Denzil Magnuson , MD   CC: Horald Pollen, *

## 2019-04-11 NOTE — Patient Instructions (Addendum)
Amitiza 24 mcg twice daily X 90 days supply with refill for 1 year ([facility] in [place])  Stop Lactulose  Benefiber 1 tablespoon twice daily with meals  Increase water intake 8-10 cups daily  Preparation H suppository at bedtime daily for 3-5 days  Stop Protonix  Start Nexium 40mg  daily, 30 minutes before breakfast  Antireflux measures  Follow up tele visit in 2-4 weeks   Gastroesophageal Reflux Disease, Adult Gastroesophageal reflux (GER) happens when acid from the stomach flows up into the tube that connects the mouth and the stomach (esophagus). Normally, food travels down the esophagus and stays in the stomach to be digested. However, when a person has GER, food and stomach acid sometimes move back up into the esophagus. If this becomes a more serious problem, the person may be diagnosed with a disease called gastroesophageal reflux disease (GERD). GERD occurs when the reflux:  Happens often.  Causes frequent or severe symptoms.  Causes problems such as damage to the esophagus. When stomach acid comes in contact with the esophagus, the acid may cause soreness (inflammation) in the esophagus. Over time, GERD may create small holes (ulcers) in the lining of the esophagus. What are the causes? This condition is caused by a problem with the muscle between the esophagus and the stomach (lower esophageal sphincter, or LES). Normally, the LES muscle closes after food passes through the esophagus to the stomach. When the LES is weakened or abnormal, it does not close properly, and that allows food and stomach acid to go back up into the esophagus. The LES can be weakened by certain dietary substances, medicines, and medical conditions, including:  Tobacco use.  Pregnancy.  Having a hiatal hernia.  Alcohol use.  Certain foods and beverages, such as coffee, chocolate, onions, and peppermint. What increases the risk? You are more likely to develop this condition if  you:  Have an increased body weight.  Have a connective tissue disorder.  Use NSAID medicines. What are the signs or symptoms? Symptoms of this condition include:  Heartburn.  Difficult or painful swallowing.  The feeling of having a lump in the throat.  Abitter taste in the mouth.  Bad breath.  Having a large amount of saliva.  Having an upset or bloated stomach.  Belching.  Chest pain. Different conditions can cause chest pain. Make sure you see your health care provider if you experience chest pain.  Shortness of breath or wheezing.  Ongoing (chronic) cough or a night-time cough.  Wearing away of tooth enamel.  Weight loss. How is this diagnosed? Your health care provider will take a medical history and perform a physical exam. To determine if you have mild or severe GERD, your health care provider may also monitor how you respond to treatment. You may also have tests, including:  A test to examine your stomach and esophagus with a small camera (endoscopy).  A test thatmeasures the acidity level in your esophagus.  A test thatmeasures how much pressure is on your esophagus.  A barium swallow or modified barium swallow test to show the shape, size, and functioning of your esophagus. How is this treated? The goal of treatment is to help relieve your symptoms and to prevent complications. Treatment for this condition may vary depending on how severe your symptoms are. Your health care provider may recommend:  Changes to your diet.  Medicine.  Surgery. Follow these instructions at home: Eating and drinking   Follow a diet as recommended by your health care  provider. This may involve avoiding foods and drinks such as: ? Coffee and tea (with or without caffeine). ? Drinks that containalcohol. ? Energy drinks and sports drinks. ? Carbonated drinks or sodas. ? Chocolate and cocoa. ? Peppermint and mint flavorings. ? Garlic and  onions. ? Horseradish. ? Spicy and acidic foods, including peppers, chili powder, curry powder, vinegar, hot sauces, and barbecue sauce. ? Citrus fruit juices and citrus fruits, such as oranges, lemons, and limes. ? Tomato-based foods, such as red sauce, chili, salsa, and pizza with red sauce. ? Fried and fatty foods, such as donuts, french fries, potato chips, and high-fat dressings. ? High-fat meats, such as hot dogs and fatty cuts of red and white meats, such as rib eye steak, sausage, ham, and bacon. ? High-fat dairy items, such as whole milk, butter, and cream cheese.  Eat small, frequent meals instead of large meals.  Avoid drinking large amounts of liquid with your meals.  Avoid eating meals during the 2-3 hours before bedtime.  Avoid lying down right after you eat.  Do not exercise right after you eat. Lifestyle   Do not use any products that contain nicotine or tobacco, such as cigarettes, e-cigarettes, and chewing tobacco. If you need help quitting, ask your health care provider.  Try to reduce your stress by using methods such as yoga or meditation. If you need help reducing stress, ask your health care provider.  If you are overweight, reduce your weight to an amount that is healthy for you. Ask your health care provider for guidance about a safe weight loss goal. General instructions  Pay attention to any changes in your symptoms.  Take over-the-counter and prescription medicines only as told by your health care provider. Do not take aspirin, ibuprofen, or other NSAIDs unless your health care provider told you to do so.  Wear loose-fitting clothing. Do not wear anything tight around your waist that causes pressure on your abdomen.  Raise (elevate) the head of your bed about 6 inches (15 cm).  Avoid bending over if this makes your symptoms worse.  Keep all follow-up visits as told by your health care provider. This is important. Contact a health care provider  if:  You have: ? New symptoms. ? Unexplained weight loss. ? Difficulty swallowing or it hurts to swallow. ? Wheezing or a persistent cough. ? A hoarse voice.  Your symptoms do not improve with treatment. Get help right away if you:  Have pain in your arms, neck, jaw, teeth, or back.  Feel sweaty, dizzy, or light-headed.  Have chest pain or shortness of breath.  Vomit and your vomit looks like blood or coffee grounds.  Faint.  Have stool that is bloody or black.  Cannot swallow, drink, or eat. Summary  Gastroesophageal reflux happens when acid from the stomach flows up into the esophagus. GERD is a disease in which the reflux happens often, causes frequent or severe symptoms, or causes problems such as damage to the esophagus.  Treatment for this condition may vary depending on how severe your symptoms are. Your health care provider may recommend diet and lifestyle changes, medicine, or surgery.  Contact a health care provider if you have new or worsening symptoms.  Take over-the-counter and prescription medicines only as told by your health care provider. Do not take aspirin, ibuprofen, or other NSAIDs unless your health care provider told you to do so.  Keep all follow-up visits as told by your health care provider. This is important. This  information is not intended to replace advice given to you by your health care provider. Make sure you discuss any questions you have with your health care provider. Document Released: 08/31/2005 Document Revised: 05/30/2018 Document Reviewed: 05/30/2018 Elsevier Interactive Patient Education  2019 [employer].   I appreciate the  opportunity to care for you  Thank You   Harl Bowie , MD

## 2019-04-11 NOTE — Telephone Encounter (Signed)
Called patient back because the message stated the patient was still having rectal bleeding. I spoke to Dr Mitchel Honour and he advise me to let patient know he has diverticulitis which can cause rectal bleeding. Patient needs to contact his Gastroenterologist.

## 2019-04-11 NOTE — Telephone Encounter (Signed)
We spoke to him already. Thanks.

## 2019-04-12 ENCOUNTER — Other Ambulatory Visit: Payer: Self-pay

## 2019-04-15 ENCOUNTER — Encounter (HOSPITAL_COMMUNITY): Payer: Self-pay

## 2019-04-15 ENCOUNTER — Emergency Department (HOSPITAL_COMMUNITY): Payer: Medicare Other

## 2019-04-15 ENCOUNTER — Ambulatory Visit: Payer: Self-pay

## 2019-04-15 ENCOUNTER — Emergency Department (HOSPITAL_COMMUNITY)
Admission: EM | Admit: 2019-04-15 | Discharge: 2019-04-15 | Disposition: A | Discharge status: Home / Self Care | Payer: Medicare Other | Attending: Emergency Medicine | Admitting: Emergency Medicine

## 2019-04-15 ENCOUNTER — Other Ambulatory Visit: Payer: Self-pay

## 2019-04-15 DIAGNOSIS — J029 Acute pharyngitis, unspecified: Secondary | ICD-10-CM | POA: Insufficient documentation

## 2019-04-15 DIAGNOSIS — Z87891 Personal history of nicotine dependence: Secondary | ICD-10-CM | POA: Insufficient documentation

## 2019-04-15 DIAGNOSIS — R0602 Shortness of breath: Secondary | ICD-10-CM

## 2019-04-15 DIAGNOSIS — Z8546 Personal history of malignant neoplasm of prostate: Secondary | ICD-10-CM | POA: Insufficient documentation

## 2019-04-15 DIAGNOSIS — Z79899 Other long term (current) drug therapy: Secondary | ICD-10-CM | POA: Diagnosis not present

## 2019-04-15 DIAGNOSIS — M792 Neuralgia and neuritis, unspecified: Secondary | ICD-10-CM | POA: Diagnosis not present

## 2019-04-15 DIAGNOSIS — Z86711 Personal history of pulmonary embolism: Secondary | ICD-10-CM

## 2019-04-15 LAB — CBC
HCT: 44.3 % (ref 39.0–52.0)
Hemoglobin: 14.2 g/dL (ref 13.0–17.0)
MCH: 26.4 pg (ref 26.0–34.0)
MCHC: 32.1 g/dL (ref 30.0–36.0)
MCV: 82.3 fL (ref 80.0–100.0)
Platelets: 218 10*3/uL (ref 150–400)
RBC: 5.38 MIL/uL (ref 4.22–5.81)
RDW: 12.8 % (ref 11.5–15.5)
WBC: 4 10*3/uL (ref 4.0–10.5)
nRBC: 0 % (ref 0.0–0.2)

## 2019-04-15 LAB — TROPONIN I: Troponin I: <0.03 ng/mL (ref <0.03)

## 2019-04-15 LAB — BASIC METABOLIC PANEL
Anion gap: 12 (ref 5–15)
BUN: 11 mg/dL (ref 8–23)
CO2: 26 mmol/L (ref 22–32)
Calcium: 9.5 mg/dL (ref 8.9–10.3)
Chloride: 101 mmol/L (ref 98–111)
Creatinine, Ser: 1.2 mg/dL (ref 0.61–1.24)
GFR calc Af Amer: >60 mL/min (ref >60)
GFR calc non Af Amer: 60 mL/min — ABNORMAL LOW (ref >60)
Glucose, Bld: 90 mg/dL (ref 70–99)
Potassium: 4.2 mmol/L (ref 3.5–5.1)
Sodium: 139 mmol/L (ref 135–145)

## 2019-04-15 LAB — BRAIN NATRIURETIC PEPTIDE: B Natriuretic Peptide: 11 pg/mL (ref 0.0–100.0)

## 2019-04-15 MED ORDER — IOPAMIDOL (ISOVUE-370) INJECTION 76%
100.0000 mL | Freq: Once | INTRAVENOUS | Status: AC | PRN
  Administered 2019-04-15: 100 mL via INTRAVENOUS

## 2019-04-15 NOTE — ED Notes (Signed)
Pt remains at CT

## 2019-04-15 NOTE — Telephone Encounter (Signed)
Pt called and said that he has been SOB and it is getting worse. Pt is talking in complete sentences. He states that his symptoms have been off and on over the last 2-3 months. He calls because it is just continuing to get worse. He has a stuffy nose he states that is his sinuses. He denies cough. He reports no fever. He state that he has a pins a nd needles sensation that comes and goes and moves around his body. He has Hx 3 years ago of blood clot in his lungs. Per protocol pt will go to ER for evaluation.   Reason for Disposition . History of prior "blood clot" in leg or lungs (i.e., deep vein thrombosis, pulmonary embolism)  Answer Assessment - Initial Assessment Questions 1. RESPIRATORY STATUS: "Describe your breathing?" (e.g., wheezing, shortness of breath, unable to speak, severe coughing)      sob 2. ONSET: "When did this breathing problem begin?"      2 month ago 3. PATTERN "Does the difficult breathing come and go, or has it been constant since it started?"     Comes and goes 4. SEVERITY: "How bad is your breathing?" (e.g., mild, moderate, severe)    - MILD: No SOB at rest, mild SOB with walking, speaks normally in sentences, can lay down, no retractions, pulse < 100.    - MODERATE: SOB at rest, SOB with minimal exertion and prefers to sit, cannot lie down flat, speaks in phrases, mild retractions, audible wheezing, pulse 100-120.    - SEVERE: Very SOB at rest, speaks in single words, struggling to breathe, sitting hunched forward, retractions, pulse > 120     Moderate 5. RECURRENT SYMPTOM: "Have you had difficulty breathing before?" If so, ask: "When was the last time?" and "What happened that time?"     2 months or more 6. CARDIAC HISTORY: "Do you have any history of heart disease?" (e.g., heart attack, angina, bypass surgery, angioplasty)     No  7. LUNG HISTORY: "Do you have any history of lung disease?"  (e.g., pulmonary embolus, asthma, emphysema)    Pulmonary embolism 3-4 years  ago 59. CAUSE: "What do you think is causing the breathing problem?"     unsure 9. OTHER SYMPTOMS: "Do you have any other symptoms? (e.g., dizziness, runny nose, cough, chest pain, fever)     Stuffy nose, sinus, sometimes dizziness, pins and needle feeling varies location 10. PREGNANCY: "Is there any chance you are pregnant?" "When was your last menstrual period?"      N/A 11. TRAVEL: "Have you traveled out of the country in the last month?" (e.g., travel history, exposures)      no  Protocols used: BREATHING DIFFICULTY-A-AH

## 2019-04-15 NOTE — Discharge Instructions (Addendum)
1.  See your doctor for follow-up as soon as possible. 2.  Return to the emergency department if you develop any new, worsening or concerning symptoms.

## 2019-04-15 NOTE — ED Notes (Signed)
Patient verbalizes understanding of discharge instructions. Opportunity for questioning and answers were provided. Armband removed by staff, pt discharged from ED.  

## 2019-04-15 NOTE — ED Provider Notes (Signed)
Medical screening examination/treatment/procedure(s) were conducted as a shared visit with non-physician practitioner(s) and myself.  I personally evaluated the patient during the encounter.  EKG Interpretation  Date/Time:  Monday Apr 15 2019 14:19:49 EDT Ventricular Rate:  86 PR Interval:  166 QRS Duration: 80 QT Interval:  356 QTC Calculation: 426 R Axis:   72 Text Interpretation:  Normal sinus rhythm Normal ECG agree. no change from old Confirmed by Charlesetta Shanks [phone]) on 04/15/2019 7:37:07 PM Patient does have prior history of PE in 2014.  Reports that he has been getting episodes of feeling short of breath.  Is not necessarily provoked by any activity.  He can be at rest and start to feel it.  It has been coming and going.  He denies fever or productive cough.  No chest pain or swelling in the legs.  He is alert and nontoxic.  No respiratory distress at rest.  Color is good and mental status clear.  No peripheral edema or calf tenderness.  Agree with plan and management.   Charlesetta Shanks, MD 04/15/19 [phone]

## 2019-04-15 NOTE — ED Provider Notes (Signed)
[facility] EMERGENCY DEPARTMENT Provider Note   CSN: [id number] Arrival date & time: 04/15/19  1407    History   Chief Complaint Chief Complaint  Patient presents with   Shortness of Breath    HPI Dannis Deroche. is a 73 y.o. male.     The history is provided by the patient and medical records. No language interpreter was used.  Shortness of Breath  Associated symptoms: sore throat   Associated symptoms: no cough and no wheezing    Franciso Dierks. is a 73 y.o. male  with a PMH as listed below who presents to the Emergency Department complaining of shortness of breath.  Patient states that this is been ongoing for about a year off and on, but progressively worsening over the last month.  His difficulty breathing will occur intermittently throughout the day.  He states that he does not know when it is coming.  He cannot think of any aggravating factors.  Specifically asked if it was exertional or pleuritic in nature, but it is not.  His shortness of breath just comes out of the blue.  Will last about an hour and then resolve.  No alleviating factors noted.  He admits to an intermittent sore throat as well, stating this is common for him due to his reflux.  He denies any other symptoms.  Specifically, he denies any chest pain, back pain, abdominal pain, nausea, vomiting, diaphoresis, leg swelling or leg pain.  No cough, congestion or fever.  He does note a pins-and-needles sensation to his feet when asked about leg pain, but states this is bilateral, burning and consistent with his known neuropathy that he has been struggling with for the last 10 or so years.  Has a history of PE 2014.  Has not been on anticoagulation for several years now. Per chart review, it does appear he was seen by both cardiology and pulmonology in 2018 for similar with thorough evaluation and uncertain etiology of symptoms.  He has not taken any medications other than his usual home medications  for his symptoms.  Past Medical History:  Diagnosis Date   Anxiety    BPH (benign prostatic hypertrophy)    Decreased white blood cell count    Depression    Diverticulosis    Erectile dysfunction    Gastric polyps    GERD (gastroesophageal reflux disease)    History of colonic polyps    History of esophageal stricture 10/31/2007   History of kidney stones    History of prostate surgery    History of rheumatic fever    Hyperlipidemia    Hypertension    Prostate cancer ([place]) 11/09/2012   Prostatism     Patient Active Problem List   Diagnosis Date Noted   Rectal bleeding 04/04/2019   Diverticulosis 04/04/2019   Internal hemorrhoids 04/04/2019   Loss of weight 01/01/2019   Dysplastic mass of the cecum s/p lap right colectomy 08/31/2017 09/02/2017   S/P right colectomy 08/31/2017   Neuropathy 11/21/2016   Screening for malignant neoplasm of prostate 09/13/2016   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   History of pulmonary embolism 07/18/2014   Adrenal adenoma 03/18/2014   Coronary atherosclerosis of native coronary artery 03/18/2014   Hyperlipidemia 11/13/2013   Impotence of organic origin 59/56/3875   Umbilical hernia 64/33/2951   Prostate cancer ([place]) 11/09/2012   Stricture and stenosis of esophagus 05/31/2012   LEUKOPENIA, MILD 05/27/2009   Esophageal reflux 10/09/2007  Dyslipidemia 07/16/2007   Depression 07/16/2007   COLONIC POLYPS, HX OF 07/16/2007    Past Surgical History:  Procedure Laterality Date   CARPAL TUNNEL RELEASE     Bilateral   ESOPHAGEAL DILATION     last dilation 8'12   INGUINAL HERNIA REPAIR     INSERTION OF MESH N/A 04/15/2013   Procedure: INSERTION OF MESH;  Surgeon: Odis Hollingshead, MD;  Location: [place];  Service: General;  Laterality: N/A;   KNEE ARTHROSCOPY     left   LAPAROSCOPIC RIGHT HEMI COLECTOMY Right 08/31/2017   Procedure: LAPAROSCOPIC ASSISTED  RIGHT HEMI  COLECTOMY;  Surgeon: Johnathan Hausen, MD;  Location: [facility];  Service: General;  Laterality: Right;   LEFT HEART CATHETERIZATION WITH CORONARY ANGIOGRAM N/A 07/18/2014   Procedure: LEFT HEART CATHETERIZATION WITH CORONARY ANGIOGRAM;  Surgeon: Burnell Blanks, MD;  Location: [facility] CATH LAB;  Service: Cardiovascular;  Laterality: N/A;   ROBOT ASSISTED LAPAROSCOPIC RADICAL PROSTATECTOMY  11/08/2012   Procedure: ROBOTIC ASSISTED LAPAROSCOPIC RADICAL PROSTATECTOMY LEVEL 1;  Surgeon: Molli Hazard, MD;  Location: [facility];  Service: Urology;  Laterality: N/A;      TONSILLECTOMY     UMBILICAL HERNIA REPAIR N/A 04/15/2013   Procedure: HERNIA REPAIR UMBILICAL ADULT;  Surgeon: Odis Hollingshead, MD;  Location: [place];  Service: General;  Laterality: N/A;        Home Medications    Prior to Admission medications   Medication Sig Start Date End Date Taking? Authorizing Provider  amLODipine (NORVASC) 5 MG tablet TAKE 1 TABLET BY MOUTH EVERY DAY 02/18/19   Lelon Perla, MD  aspirin 81 MG tablet Take 81 mg by mouth daily.    [provider]  esomeprazole (NEXIUM) 40 MG capsule Take 1 capsule (40 mg total) by mouth daily. 30 min before  breakfast 04/11/19   Mauri Pole, MD  lubiprostone (AMITIZA) 24 MCG capsule Take 1 capsule (24 mcg total) by mouth 2 (two) times daily with a meal. 04/11/19   Nandigam, Venia Minks, MD  OVER THE COUNTER MEDICATION daily.    [provider]  pravastatin (PRAVACHOL) 40 MG tablet Take 1 tablet (40 mg total) by mouth daily. TAKE 1 TABLET BY MOUTH EVERY DAY IN THE EVENING 04/04/19 07/03/19  Horald Pollen, MD  vardenafil (LEVITRA) 20 MG tablet Take 20 mg by mouth as needed.    02/29/12  [provider]    Family History Family History  Problem Relation Age of Onset   Ovarian cancer Mother    Colon cancer Mother 86   High Cholesterol Mother    Stroke Father    Lung cancer Maternal Uncle    Lung cancer Maternal Aunt      Esophageal cancer Neg Hx    Stomach cancer Neg Hx    Rectal cancer Neg Hx    Pancreatic cancer Neg Hx    Liver disease Neg Hx     Social History Social History   Tobacco Use   Smoking status: Former Smoker    Packs/day: 1.00    Years: 25.00    Pack years: 25.00    Types: Cigarettes    Last attempt to quit: 12/05/1993    Years since quitting: 25.3   Smokeless tobacco: Never Used  Substance Use Topics   Alcohol use: No   Drug use: No     Allergies   Sulfonamide derivatives   Review of Systems Review of Systems  HENT: Positive for sore throat. Negative for  congestion, sneezing and trouble swallowing.   Respiratory: Positive for shortness of breath. Negative for cough and wheezing.   All other systems reviewed and are negative.    Physical Exam Updated Vital Signs BP (!) 143/89    Pulse 70    Temp 98.7 F (37.1 C) (Oral)    Resp 13    SpO2 100%   Physical Exam Vitals signs and nursing note reviewed.  Constitutional:      General: He is not in acute distress.    Appearance: He is well-developed.     Comments: Nontoxic-appearing.  HENT:     Head: Normocephalic and atraumatic.  Neck:     Comments: Supple.  No JVD. Cardiovascular:     Rate and Rhythm: Normal rate and regular rhythm.     Heart sounds: Normal heart sounds. No murmur.  Pulmonary:     Effort: Pulmonary effort is normal. No respiratory distress.     Breath sounds: Normal breath sounds.     Comments: Lungs are clear to auscultation bilaterally.  He is speaking in full sentences without any difficulty.  Does not appear short of breath. Abdominal:     General: There is no distension.     Palpations: Abdomen is soft.     Tenderness: There is no abdominal tenderness.  Musculoskeletal:     Comments: No lower extremity edema.  No calf tenderness.  Skin:    General: Skin is warm and dry.  Neurological:     Mental Status: He is alert and oriented to person, place, and time.      ED  Treatments / Results  Labs (all labs ordered are listed, but only abnormal results are displayed) Labs Reviewed  BASIC METABOLIC PANEL - Abnormal; Notable for the following components:      Result Value   GFR calc non Af Amer 60 (*)    All other components within normal limits  CBC  BRAIN NATRIURETIC PEPTIDE  TROPONIN I    EKG EKG Interpretation  Date/Time:  Monday Apr 15 2019 14:19:49 EDT Ventricular Rate:  86 PR Interval:  166 QRS Duration: 80 QT Interval:  356 QTC Calculation: 426 R Axis:   72 Text Interpretation:  Normal sinus rhythm Normal ECG agree. no change from old Confirmed by Charlesetta Shanks [phone]) on 04/15/2019 7:37:07 PM   Radiology Dg Chest 2 View  Result Date: 04/15/2019 CLINICAL DATA:  Shortness of breath and nerve pains. EXAM: CHEST - 2 VIEW COMPARISON:  01/01/2019 FINDINGS: Lungs are adequately inflated and otherwise clear. Cardiomediastinal silhouette and remainder of the exam is unchanged. IMPRESSION: No active cardiopulmonary disease. Electronically Signed   By: Marin Olp M.D.   On: 04/15/2019 14:49   Ct Angio Chest Pe W And/or Wo Contrast  Result Date: 04/15/2019 CLINICAL DATA:  Shortness of breath over the past few days. History of pulmonary embolism. EXAM: CT ANGIOGRAPHY CHEST WITH CONTRAST TECHNIQUE: Multidetector CT imaging of the chest was performed using the standard protocol during bolus administration of intravenous contrast. Multiplanar CT image reconstructions and MIPs were obtained to evaluate the vascular anatomy. CONTRAST:  154mL ISOVUE-370 IOPAMIDOL (ISOVUE-370) INJECTION 76% COMPARISON:  Chest radiograph 04/15/2019.  CT of 01/11/2017 FINDINGS: Cardiovascular: The quality of this exam for evaluation of pulmonary embolism is good. No evidence of pulmonary embolism. Aortic atherosclerosis. No aortic dissection. Normal heart size, without pericardial effusion. Multivessel coronary artery atherosclerosis. Mediastinum/Nodes: No mediastinal or hilar  adenopathy. Tiny hiatal hernia. Lungs/Pleura: No pleural fluid.  Mild centrilobular emphysema. Clear lungs. Upper  Abdomen: Normal imaged portions of the liver, spleen, kidneys. The gastric antrum appears mildly thick walled, but is underdistended. Example image 9/12. Right larger than left adrenal nodules, including at 2.3 cm on the right and 2.0 cm on the left. These are grossly similar to 04/08/2015 abdominal CT. Low-density on series 5, consistent with adenomas. Musculoskeletal: No acute osseous abnormality. Review of the MIP images confirms the above findings. IMPRESSION: 1.  No evidence of pulmonary embolism. 2. Aortic atherosclerosis (ICD10-I70.0), coronary artery atherosclerosis and emphysema (ICD10-J43.9). 3.  Tiny hiatal hernia. 4. Bilateral adrenal adenomas. 5. Apparent gastric antral wall thickening could be due to underdistention. Correlate with any symptoms of gastritis. Electronically Signed   By: Abigail Miyamoto M.D.   On: 04/15/2019 18:27    Procedures Procedures (including critical care time)  Medications Ordered in ED Medications  iopamidol (ISOVUE-370) 76 % injection 100 mL (100 mLs Intravenous Contrast Given 04/15/19 1809)     Initial Impression / Assessment and Plan / ED Course  I have reviewed the triage vital signs and the nursing notes.  Pertinent labs & imaging results that were available during my care of the patient were reviewed by me and considered in my medical decision making (see chart for details).       [name] is a 73 y.o. male who presents to ED for shortness of breath which has been ongoing intermittently for about a year. Worse in the last month.  On exam, patient is afebrile, hemodynamically stable with normal cardiopulmonary examination.  Labs reviewed and reassuring including normal troponin and BNP.  Chest x-ray with signs of pneumonia or other acute findings.  EKG unchanged.  Patient does have history of pulmonary embolus several years ago. No  longer on anticoagulation. Given history, ct angio was obtained which showed no acute findings. He appears well. Will have him follow up with his pcp or pulmonologist. Reasons to return to ER discussed and all questions answered.   Patient seen by and discussed with Dr. Johnney Killian who agrees with treatment plan.   Final Clinical Impressions(s) / ED Diagnoses   Final diagnoses:  Shortness of breath  History of pulmonary embolus (PE)    ED Discharge Orders    None       Mayu Ronk, Ozella Almond, PA-C 04/15/19 1940    Charlesetta Shanks, MD 04/17/19 [phone]

## 2019-04-15 NOTE — ED Triage Notes (Signed)
Pt reports increased SOB the past few days, sattes it has been going on for a [place] time but got worse today. Denies cough or fever. States it feels like pins and needles at the bases of his lungs. Pt a.o, resp e/u at this time.

## 2019-04-15 NOTE — Telephone Encounter (Signed)
Pt being evaluated at the ED today.

## 2019-05-02 ENCOUNTER — Ambulatory Visit: Payer: Medicare Other | Admitting: Gastroenterology

## 2019-05-16 ENCOUNTER — Other Ambulatory Visit: Payer: Self-pay | Admitting: Cardiology

## 2019-06-28 ENCOUNTER — Telehealth: Payer: Self-pay | Admitting: Gastroenterology

## 2019-06-28 NOTE — Telephone Encounter (Signed)
Pt has GERD and stated that he had received "a letter stating what he should and should not do."  Pt requested to have letter resent as he had lost it.

## 2019-06-28 NOTE — Telephone Encounter (Signed)
AVS printed and mailed

## 2019-07-01 DIAGNOSIS — H2513 Age-related nuclear cataract, bilateral: Secondary | ICD-10-CM | POA: Diagnosis not present

## 2019-07-08 ENCOUNTER — Telehealth: Payer: Self-pay | Admitting: *Deleted

## 2019-07-08 MED ORDER — ESOMEPRAZOLE MAGNESIUM 40 MG PO CPDR: 40.0000 mg | DELAYED_RELEASE_CAPSULE | Freq: Every day | ORAL | 3 refills | Status: DC

## 2019-07-08 NOTE — Telephone Encounter (Signed)
Nexium sent to pharmacy as requested by pharmacy

## 2019-07-15 ENCOUNTER — Telehealth: Payer: Self-pay | Admitting: Cardiology

## 2019-07-15 ENCOUNTER — Other Ambulatory Visit: Payer: Self-pay | Admitting: Cardiology

## 2019-07-15 NOTE — Telephone Encounter (Signed)
Pt overdue for 12 month f/u. °Please contact pt for future appointment. °

## 2019-07-15 NOTE — Telephone Encounter (Signed)
LVM for patient to call back and schedule a followup with Dr. Stanford Breed.

## 2019-07-16 ENCOUNTER — Encounter: Payer: Self-pay | Admitting: Emergency Medicine

## 2019-07-16 ENCOUNTER — Telehealth (INDEPENDENT_AMBULATORY_CARE_PROVIDER_SITE_OTHER): Payer: Medicare Other | Admitting: Emergency Medicine

## 2019-07-16 ENCOUNTER — Other Ambulatory Visit: Payer: Self-pay

## 2019-07-16 ENCOUNTER — Telehealth: Payer: Self-pay | Admitting: Emergency Medicine

## 2019-07-16 DIAGNOSIS — I10 Essential (primary) hypertension: Secondary | ICD-10-CM

## 2019-07-16 DIAGNOSIS — Z8546 Personal history of malignant neoplasm of prostate: Secondary | ICD-10-CM

## 2019-07-16 DIAGNOSIS — R634 Abnormal weight loss: Secondary | ICD-10-CM

## 2019-07-16 MED ORDER — AMLODIPINE BESYLATE 5 MG PO TABS: 5.0000 mg | ORAL_TABLET | Freq: Every day | ORAL | 3 refills | Status: DC

## 2019-07-16 NOTE — Progress Notes (Signed)
Telemedicine Encounter- SOAP NOTE Established Patient  This telephone encounter was conducted with the patient's (or proxy's) verbal consent via audio telecommunications: yes/no: Yes Patient was instructed to have this encounter in a suitably private space; and to only have persons present to whom they give permission to participate. In addition, patient identity was confirmed by use of name plus two identifiers (DOB and address).  I discussed the limitations, risks, security and privacy concerns of performing an evaluation and management service by telephone and the availability of in person appointments. I also discussed with the patient that there may be a patient responsible charge related to this service. The patient expressed understanding and agreed to proceed.  I spent a total of TIME; 0 MIN TO 60 MIN: 15 minutes talking with the patient or their proxy.  No chief complaint on file. Medication refill.  Weight loss.  Subjective   Omar [name]. is a 73 y.o. male established patient. Telephone visit today for medication refill.  Patient takes amlodipine for hypertension.  Also complaining of weight loss, 20 to 25 pounds over the past couple of months.  Colonoscopy from November 2019 reviewed with patient.  Positive for diverticulosis and internal hemorrhoids.  No signs of malignancy.  Patient has a history of prostate cancer.  Normal PSA last May.  Will need evaluation by urologist.  Recent CT scan of the chest showed emphysema but no signs of malignancy.  Normal blood work done last May.  HPI   Patient Active Problem List   Diagnosis Date Noted  . Diverticulosis 04/04/2019  . Internal hemorrhoids 04/04/2019  . 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 S/P right colectomy 08/31/2017  . Neuropathy 11/21/2016  . 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 History of pulmonary embolism 07/18/2014  . Adrenal adenoma 03/18/2014   . Coronary atherosclerosis of native coronary artery 03/18/2014  . Hyperlipidemia 11/13/2013  . 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 Umbilical hernia 35/70/1779  . Prostate cancer ([place]) 11/09/2012  . Stricture and stenosis of esophagus 05/31/2012  . Esophageal reflux 10/09/2007  . Dyslipidemia 07/16/2007  . Depression 07/16/2007  . COLONIC POLYPS, HX OF 07/16/2007    Past Medical History:  Diagnosis Date  . Anxiety   . BPH (benign prostatic hypertrophy)   . Decreased white blood cell count   . Depression   . Diverticulosis   . Erectile dysfunction   . Gastric polyps   . GERD (gastroesophageal reflux disease)   . History of colonic polyps   . History of esophageal stricture 10/31/2007  . History of kidney stones   . History of prostate surgery   . History of rheumatic fever   . Hyperlipidemia   . Hypertension   . Prostate cancer ([place]) 11/09/2012  . Prostatism     Current Outpatient Medications  Medication Sig Dispense Refill  . lubiprostone (AMITIZA) 24 MCG capsule Take 1 capsule (24 mcg total) by mouth 2 (two) times daily with a meal. 180 capsule 3  . OVER THE COUNTER MEDICATION daily.    Marland Kitchen amLODipine (NORVASC) 5 MG tablet Take 1 tablet (5 mg total) by mouth daily. 90 tablet 3  . aspirin 81 MG tablet Take 81 mg by mouth daily.    Marland Kitchen esomeprazole (NEXIUM) 40 MG capsule Take 1 capsule (40 mg total) by mouth daily. 30 min before  breakfast (Patient not taking: Reported on 07/16/2019) 30 capsule 3  . pravastatin (PRAVACHOL) 40 MG tablet  Take 1 tablet (40 mg total) by mouth daily. TAKE 1 TABLET BY MOUTH EVERY DAY IN THE EVENING 90 tablet 3   Current Facility-Administered Medications  Medication Dose Route Frequency Provider Last Rate Last Dose  . 0.9 %  sodium chloride infusion  500 mL Intravenous Once Nandigam, Venia Minks, MD        Allergies  Allergen Reactions  . Sulfonamide Derivatives Other (See Comments)     blisters on skin    Social History   Socioeconomic  History  . Marital status: Married    Spouse name: Not on file  . Number of children: 4  . Years of education: Not on file  . Highest education level: Not on file  Occupational History  . Occupation: Mining engineer: [employer]  . Financial resource strain: Not on file  . Food insecurity    Worry: Not on file    Inability: Not on file  . Transportation needs    Medical: Not on file    Non-medical: Not on file  Tobacco Use  . Smoking status: Former Smoker    Packs/day: 1.00    Years: 25.00    Pack years: 25.00    Types: Cigarettes    Quit date: 12/05/1993    Years since quitting: 25.6  . Smokeless tobacco: Never Used  Substance and Sexual Activity  . Alcohol use: No  . Drug use: No  . Sexual activity: Yes  Lifestyle  . Physical activity    Days per week: Not on file    Minutes per session: Not on file  . Stress: Not on file  Relationships  . Social Herbalist on phone: Not on file    Gets together: Not on file    Attends religious service: Not on file    Active member of club or organization: Not on file    Attends meetings of clubs or organizations: Not on file    Relationship status: Not on file  . Intimate partner violence    Fear of current or ex partner: Not on file    Emotionally abused: Not on file    Physically abused: Not on file    Forced sexual activity: Not on file  Other Topics Concern  . Not on file  Social History Narrative   Lives with wife in a one story home.  Has 4 children.  Works as a Theme park manager.     Education: 10th grade.     Review of Systems  Constitutional: Positive for weight loss. Negative for fever.  HENT: Negative.  Negative for congestion and sore throat.   Eyes: Negative.   Respiratory: Negative.  Negative for cough and shortness of breath.   Cardiovascular: Negative.  Negative for chest pain and palpitations.  Gastrointestinal: Negative.  Negative for abdominal pain, diarrhea, nausea and  vomiting.  Genitourinary: Negative for hematuria.       Weak urinary flow and some dribbling, chronic, no new symptoms.  Musculoskeletal: Negative for myalgias.  Skin: Negative.  Negative for rash.  Neurological: Negative for dizziness and headaches.  All other systems reviewed and are negative.   Objective   Vitals as reported by the patient: There were no vitals filed for this visit. Diagnoses and all orders for this visit:  Essential hypertension -     amLODipine (NORVASC) 5 MG tablet; Take 1 tablet (5 mg total) by mouth daily.  Loss of weight -  Ambulatory referral to Urology -     Ambulatory referral to Hematology / Oncology  History of prostate cancer -     Ambulatory referral to Urology -     Ambulatory referral to Hematology / Oncology    History concerning for malignancy.  Will refer to oncology also.   I discussed the assessment and treatment plan with the patient. The patient was provided an opportunity to ask questions and all were answered. The patient agreed with the plan and demonstrated an understanding of the instructions.   The patient was advised to call back or seek an in-person evaluation if the symptoms worsen or if the condition fails to improve as anticipated.  I provided 15 minutes of non-face-to-face time during this encounter.  Horald Pollen, MD  Primary Care at [facility]

## 2019-07-16 NOTE — Telephone Encounter (Signed)
Scheduled

## 2019-07-16 NOTE — Progress Notes (Signed)
Called patient to triage for appointment. Patient states he needs a refill on Amlodipine. Patient did not state any other complains to discuss.

## 2019-07-17 ENCOUNTER — Emergency Department (HOSPITAL_COMMUNITY)
Admission: EM | Admit: 2019-07-17 | Discharge: 2019-07-17 | Disposition: A | Discharge status: Home / Self Care | Payer: Medicare Other | Attending: Emergency Medicine | Admitting: Emergency Medicine

## 2019-07-17 ENCOUNTER — Ambulatory Visit: Payer: Self-pay | Admitting: Emergency Medicine

## 2019-07-17 ENCOUNTER — Emergency Department (HOSPITAL_COMMUNITY): Payer: Medicare Other

## 2019-07-17 ENCOUNTER — Other Ambulatory Visit: Payer: Self-pay

## 2019-07-17 ENCOUNTER — Encounter (HOSPITAL_COMMUNITY): Payer: Self-pay

## 2019-07-17 DIAGNOSIS — I2510 Atherosclerotic heart disease of native coronary artery without angina pectoris: Secondary | ICD-10-CM | POA: Diagnosis not present

## 2019-07-17 DIAGNOSIS — D3500 Benign neoplasm of unspecified adrenal gland: Secondary | ICD-10-CM | POA: Diagnosis not present

## 2019-07-17 DIAGNOSIS — K921 Melena: Secondary | ICD-10-CM

## 2019-07-17 DIAGNOSIS — Z7982 Long term (current) use of aspirin: Secondary | ICD-10-CM | POA: Diagnosis not present

## 2019-07-17 DIAGNOSIS — Z8546 Personal history of malignant neoplasm of prostate: Secondary | ICD-10-CM | POA: Diagnosis not present

## 2019-07-17 DIAGNOSIS — R634 Abnormal weight loss: Secondary | ICD-10-CM | POA: Diagnosis not present

## 2019-07-17 DIAGNOSIS — Z87891 Personal history of nicotine dependence: Secondary | ICD-10-CM | POA: Diagnosis not present

## 2019-07-17 DIAGNOSIS — N281 Cyst of kidney, acquired: Secondary | ICD-10-CM | POA: Diagnosis not present

## 2019-07-17 DIAGNOSIS — Z79899 Other long term (current) drug therapy: Secondary | ICD-10-CM | POA: Insufficient documentation

## 2019-07-17 DIAGNOSIS — I10 Essential (primary) hypertension: Secondary | ICD-10-CM | POA: Diagnosis not present

## 2019-07-17 LAB — COMPREHENSIVE METABOLIC PANEL
ALT: 18 U/L (ref 0–44)
AST: 14 U/L — ABNORMAL LOW (ref 15–41)
Albumin: 4 g/dL (ref 3.5–5.0)
Alkaline Phosphatase: 62 U/L (ref 38–126)
Anion gap: 7 (ref 5–15)
BUN: 15 mg/dL (ref 8–23)
CO2: 28 mmol/L (ref 22–32)
Calcium: 9.4 mg/dL (ref 8.9–10.3)
Chloride: 104 mmol/L (ref 98–111)
Creatinine, Ser: 1.14 mg/dL (ref 0.61–1.24)
GFR calc Af Amer: >60 mL/min (ref >60)
GFR calc non Af Amer: >60 mL/min (ref >60)
Glucose, Bld: 96 mg/dL (ref 70–99)
Potassium: 4.1 mmol/L (ref 3.5–5.1)
Sodium: 139 mmol/L (ref 135–145)
Total Bilirubin: 0.9 mg/dL (ref 0.3–1.2)
Total Protein: 6.5 g/dL (ref 6.5–8.1)

## 2019-07-17 LAB — CBC
HCT: 45.6 % (ref 39.0–52.0)
Hemoglobin: 14.3 g/dL (ref 13.0–17.0)
MCH: 26.4 pg (ref 26.0–34.0)
MCHC: 31.4 g/dL (ref 30.0–36.0)
MCV: 84.1 fL (ref 80.0–100.0)
Platelets: 239 10*3/uL (ref 150–400)
RBC: 5.42 MIL/uL (ref 4.22–5.81)
RDW: 12.9 % (ref 11.5–15.5)
WBC: 3.2 10*3/uL — ABNORMAL LOW (ref 4.0–10.5)
nRBC: 0 % (ref 0.0–0.2)

## 2019-07-17 LAB — POC OCCULT BLOOD, ED: Fecal Occult Bld: NEGATIVE

## 2019-07-17 LAB — TYPE AND SCREEN
ABO/RH(D): AB POS
Antibody Screen: NEGATIVE

## 2019-07-17 MED ORDER — IOHEXOL 300 MG/ML  SOLN
100.0000 mL | Freq: Once | INTRAMUSCULAR | Status: AC | PRN
  Administered 2019-07-17: 100 mL via INTRAVENOUS

## 2019-07-17 NOTE — Telephone Encounter (Signed)
  I returned pt's call.   He had a black colored stool last night and again this morning.  Denies pain however he has been feeling dizzy.    He mentioned he has lost a lot of weight too.  He mentioned he has started taking iron this past week.   However due to the dizziness and weight loss  I referred him to the ED.    He is going to [facility].   "I have an appt at the [facility] here in [place] because I've lost a lot of weight".    "Dr. Mitchel Honour ordered it".     "Should I go there?"    I let him know he needed to go to the ED not the [facility].   He verbalized understanding and is going to the ED now.   Reason for Disposition . Tarry or jet black-colored stool (not dark green)  Answer Assessment - Initial Assessment Questions 1. APPEARANCE of BLOOD: "What color is it?" "Is it passed separately, on the surface of the stool, or mixed in with the stool?"      My stools are almost black.   It happened last night.  It happened again this morning.     2. AMOUNT: "How much blood was passed?"      Black stools 3. FREQUENCY: "How many times has blood been passed with the stools?"      Last night and this morning. 4. ONSET: "When was the blood first seen in the stools?" (Days or weeks)      *Last night 5. DIARRHEA: "Is there also some diarrhea?" If so, ask: "How many diarrhea stools were passed in past 24 hours?"      No 6. CONSTIPATION: "Do you have constipation?" If so, "How bad is it?"     I always have a problem with that. 7. RECURRENT SYMPTOMS: "Have you had blood in your stools before?" If so, ask: "When was the last time?" and "What happened that time?"      No 8. BLOOD THINNERS: "Do you take any blood thinners?" (e.g., Coumadin/warfarin, Pradaxa/dabigatran, aspirin)     No 9. OTHER SYMPTOMS: "Do you have any other symptoms?"  (e.g., abdominal pain, vomiting, dizziness, fever)     I've been dizzy.   I've lost a lot of weight.   I've not been myself for a while. 10.  PREGNANCY: "Is there any chance you are pregnant?" "When was your last menstrual period?"       N/A  Protocols used: RECTAL BLEEDING-A-AH

## 2019-07-17 NOTE — ED Provider Notes (Signed)
[facility] EMERGENCY DEPARTMENT Provider Note   CSN: [id number] Arrival date & time: 07/17/19  1303     History   Chief Complaint Chief Complaint  Patient presents with  . Melena    HPI Omar [name]. is a 73 y.o. male.     HPI   73 year old male with unintentional weight loss and dark-colored stools.  Patient reports 39 pound weight loss in the past 1.5 to 2 months.  He also reports black-colored stool in the past couple days.  Patient is unsure why he is lost his weight.  His appetite has been stable.  He does admit to feeling somewhat depressed.  Denies any acute pain.  No recent medication changes.  Distant smoking history for many years but stopped approximately 30 years ago.  Past Medical History:  Diagnosis Date  . Anxiety   . BPH (benign prostatic hypertrophy)   . Decreased white blood cell count   . Depression   . Diverticulosis   . Erectile dysfunction   . Gastric polyps   . GERD (gastroesophageal reflux disease)   . History of colonic polyps   . History of esophageal stricture 10/31/2007  . History of kidney stones   . History of prostate surgery   . History of rheumatic fever   . Hyperlipidemia   . Hypertension   . Prostate cancer ([place]) 11/09/2012  . Prostatism     Patient Active Problem List   Diagnosis Date Noted  . Diverticulosis 04/04/2019  . Internal hemorrhoids 04/04/2019  . 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 S/P right colectomy 08/31/2017  . Neuropathy 11/21/2016  . 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 History of pulmonary embolism 07/18/2014  . 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 Coronary atherosclerosis of native coronary artery 03/18/2014  . Hyperlipidemia 11/13/2013  . 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 Umbilical hernia 81/44/8185  . Prostate cancer ([place]) 11/09/2012  . Stricture and stenosis of esophagus 05/31/2012  . Esophageal reflux 10/09/2007  . Dyslipidemia  07/16/2007  . Depression 07/16/2007  . COLONIC POLYPS, HX OF 07/16/2007    Past Surgical History:  Procedure Laterality Date  . CARPAL TUNNEL RELEASE     Bilateral  . ESOPHAGEAL DILATION     last dilation 8'12  . INGUINAL HERNIA REPAIR    . INSERTION OF MESH N/A 04/15/2013   Procedure: INSERTION OF MESH;  Surgeon: Odis Hollingshead, MD;  Location: [place];  Service: General;  Laterality: N/A;  . KNEE ARTHROSCOPY     left  . LAPAROSCOPIC RIGHT HEMI COLECTOMY Right 08/31/2017   Procedure: LAPAROSCOPIC ASSISTED  RIGHT HEMI COLECTOMY;  Surgeon: Johnathan Hausen, MD;  Location: [facility];  Service: General;  Laterality: Right;  . LEFT HEART CATHETERIZATION WITH CORONARY ANGIOGRAM N/A 07/18/2014   Procedure: LEFT HEART CATHETERIZATION WITH CORONARY ANGIOGRAM;  Surgeon: Burnell Blanks, MD;  Location: [facility] CATH LAB;  Service: Cardiovascular;  Laterality: N/A;  . ROBOT ASSISTED LAPAROSCOPIC RADICAL PROSTATECTOMY  11/08/2012   Procedure: ROBOTIC ASSISTED LAPAROSCOPIC RADICAL PROSTATECTOMY LEVEL 1;  Surgeon: Molli Hazard, MD;  Location: [facility];  Service: Urology;  Laterality: N/A;     . TONSILLECTOMY    . UMBILICAL HERNIA REPAIR N/A 04/15/2013   Procedure: HERNIA REPAIR UMBILICAL ADULT;  Surgeon: Odis Hollingshead, MD;  Location: [place];  Service: General;  Laterality: N/A;        Home Medications    Prior to Admission medications   Medication  Sig Start Date End Date Taking? Authorizing Provider  amLODipine (NORVASC) 5 MG tablet Take 1 tablet (5 mg total) by mouth daily. 07/16/19 10/14/19  Horald Pollen, MD  aspirin 81 MG tablet Take 81 mg by mouth daily.    [provider]  esomeprazole (NEXIUM) 40 MG capsule Take 1 capsule (40 mg total) by mouth daily. 30 min before  breakfast Patient not taking: Reported on 07/16/2019 07/08/19   Mauri [place], MD  lubiprostone (AMITIZA) 24 MCG capsule Take 1 capsule (24 mcg total) by mouth 2 (two) times daily with a meal. 04/11/19    Nandigam, Venia Minks, MD  OVER THE COUNTER MEDICATION daily.    [provider]  pravastatin (PRAVACHOL) 40 MG tablet Take 1 tablet (40 mg total) by mouth daily. TAKE 1 TABLET BY MOUTH EVERY DAY IN THE EVENING 04/04/19 07/03/19  Horald Pollen, MD  vardenafil (LEVITRA) 20 MG tablet Take 20 mg by mouth as needed.    02/29/12  [provider]    Family History Family History  Problem Relation Age of Onset  . Ovarian cancer Mother   . Colon cancer Mother 89  . High Cholesterol Mother   . Stroke Father   . Lung cancer Maternal Uncle   . Lung cancer Maternal Aunt   . Esophageal cancer Neg Hx   . Stomach cancer Neg Hx   . Rectal cancer Neg Hx   . Pancreatic cancer Neg Hx   . Liver disease Neg Hx     Social History Social History   Tobacco Use  . Smoking status: Former Smoker    Packs/day: 1.00    Years: 25.00    Pack years: 25.00    Types: Cigarettes    Quit date: 12/05/1993    Years since quitting: 25.6  . Smokeless tobacco: Never Used  Substance Use Topics  . Alcohol use: No  . Drug use: No     Allergies   Sulfonamide derivatives   Review of Systems Review of Systems  All systems reviewed and negative, other than as noted in HPI. Physical Exam Updated Vital Signs BP 119/86   Pulse 68   Temp 98.2 F (36.8 C) (Oral)   Resp 15   Ht 6\' 3"  (1.905 m)   Wt 81.5 kg   SpO2 100%   BMI 22.46 kg/m   Physical Exam Vitals signs and nursing note reviewed.  Constitutional:      General: He is not in acute distress.    Appearance: He is well-developed.  HENT:     Head: Normocephalic and atraumatic.  Eyes:     General:        Right eye: No discharge.        Left eye: No discharge.     Conjunctiva/sclera: Conjunctivae normal.  Neck:     Musculoskeletal: Neck supple.  Cardiovascular:     Rate and Rhythm: Normal rate and regular rhythm.     Heart sounds: Normal heart sounds. No murmur. No friction rub. No gallop.   Pulmonary:     Effort:  Pulmonary effort is normal. No respiratory distress.     Breath sounds: Normal breath sounds.  Abdominal:     General: There is no distension.     Palpations: Abdomen is soft.     Tenderness: There is no abdominal tenderness.  Musculoskeletal:        General: No tenderness.  Skin:    General: Skin is warm and dry.  Neurological:  Mental Status: He is alert.  Psychiatric:        Behavior: Behavior normal.        Thought Content: Thought content normal.      ED Treatments / Results  Labs (all labs ordered are listed, but only abnormal results are displayed) Labs Reviewed  COMPREHENSIVE METABOLIC PANEL - Abnormal; Notable for the following components:      Result Value   AST 14 (*)    All other components within normal limits  CBC - Abnormal; Notable for the following components:   WBC 3.2 (*)    All other components within normal limits  POC OCCULT BLOOD, ED  TYPE AND SCREEN    EKG None  Radiology No results found.   Ct Abdomen Pelvis W Contrast  Result Date: 07/17/2019 CLINICAL DATA:  Generalized abdominal pain, weight loss. EXAM: CT ABDOMEN AND PELVIS WITH CONTRAST TECHNIQUE: Multidetector CT imaging of the abdomen and pelvis was performed using the standard protocol following bolus administration of intravenous contrast. CONTRAST:  149mL OMNIPAQUE IOHEXOL 300 MG/ML  SOLN COMPARISON:  CT scan of Apr 08, 2015. FINDINGS: Lower chest: No acute abnormality. Hepatobiliary: No gallstones or biliary dilatation is noted. Stable small right hepatic cyst is noted. Pancreas: Unremarkable. No pancreatic ductal dilatation or surrounding inflammatory changes. Spleen: Normal in size without focal abnormality. Adrenals/Urinary Tract: Stable bilateral adrenal adenomas are noted. No hydronephrosis or renal obstruction is noted. Left renal cysts are noted. No hydronephrosis or renal obstruction is noted. Urinary bladder is unremarkable. Stomach/Bowel: The stomach is unremarkable. Status post  right hemicolectomy. There is no evidence of bowel obstruction or inflammation. Vascular/Lymphatic: Aortic atherosclerosis. No enlarged abdominal or pelvic lymph nodes. Reproductive: Status post prostatectomy. Other: No abdominal wall hernia or abnormality. No abdominopelvic ascites. Musculoskeletal: No acute or significant osseous findings. IMPRESSION: Stable bilateral adrenal adenomas. No acute abnormality seen in the abdomen or pelvis. Aortic Atherosclerosis (ICD10-I70.0). Electronically Signed   By: Marijo Conception M.D.   On: 07/17/2019 16:52   Dg Chest Portable 1 View  Result Date: 07/17/2019 CLINICAL DATA:  40 pound weight loss in 2 months EXAM: PORTABLE CHEST 1 VIEW COMPARISON:  04/15/2019 FINDINGS: The heart size and mediastinal contours are within normal limits. Both lungs are clear. The visualized skeletal structures are unremarkable. IMPRESSION: No active disease. Electronically Signed   By: Donavan Foil M.D.   On: 07/17/2019 16:14    Procedures Procedures (including critical care time)  Medications Ordered in ED Medications  iohexol (OMNIPAQUE) 300 MG/ML solution 100 mL (100 mLs Intravenous Contrast Given 07/17/19 1628)     Initial Impression / Assessment and Plan / ED Course  I have reviewed the triage vital signs and the nursing notes.  Pertinent labs & imaging results that were available during my care of the patient were reviewed by me and considered in my medical decision making (see chart for details).        73 year old male with significant unintentional weight loss over the past 1.5 to 2 months.  Also black-colored stools.  He is not anticoagulated.  He is not anemic.  He is Hemoccult negative here.  His BUN is normal.  His exam is nonfocal.  No known malignancy but does have a distant smoking history.  Will obtain some screening imaging including CT the abdomen pelvis and a chest x-ray.  He has established outpatient follow-up.  He will likely need to further discuss  with them.  At this time it does not appear to be an  emergent process.    Final Clinical Impressions(s) / ED Diagnoses   Final diagnoses:  Unintentional weight loss  Stool color black    ED Discharge Orders    None       Virgel Manifold, MD 07/26/19 1451

## 2019-07-17 NOTE — ED Triage Notes (Signed)
Pt seen by his PCP due to weight loss of at least 30 lbs and 2 episodes of black stools once last night and then today

## 2019-07-23 ENCOUNTER — Ambulatory Visit (INDEPENDENT_AMBULATORY_CARE_PROVIDER_SITE_OTHER): Payer: Medicare Other | Admitting: Emergency Medicine

## 2019-07-23 ENCOUNTER — Encounter: Payer: Self-pay | Admitting: Emergency Medicine

## 2019-07-23 ENCOUNTER — Other Ambulatory Visit: Payer: Self-pay

## 2019-07-23 VITALS — BP 121/75 | HR 83 | Temp 98.3°F | Resp 16 | Ht 75.0 in | Wt 182.0 lb

## 2019-07-23 DIAGNOSIS — R634 Abnormal weight loss: Secondary | ICD-10-CM | POA: Diagnosis not present

## 2019-07-23 DIAGNOSIS — K219 Gastro-esophageal reflux disease without esophagitis: Secondary | ICD-10-CM | POA: Diagnosis not present

## 2019-07-23 DIAGNOSIS — I10 Essential (primary) hypertension: Secondary | ICD-10-CM | POA: Diagnosis not present

## 2019-07-23 DIAGNOSIS — Z8546 Personal history of malignant neoplasm of prostate: Secondary | ICD-10-CM | POA: Diagnosis not present

## 2019-07-23 NOTE — Progress Notes (Signed)
Omar [name]. 72 y.o.   Chief Complaint  Patient presents with  . Hospitalization Follow-up    1 week follow up/ pt state he is doing better  . Sore Throat    pt statees he thinks it is due to his acid reflux    HISTORY OF PRESENT ILLNESS: This is a 73 y.o. male status post telemedicine visit on 07/16/2019.  Seen in the emergency room the following day 07/17/2019 with 2 episodes of melena.  Weight loss of 20 to 25 pounds in the last several months.  Work-up in the ED was negative.  Released home later that day.  Patient has a history of GERD with recent upper endoscopy, recent lower endoscopy.  Recently started on esomeprazole with good results.  Patient feels his appetite has been affected by the reflux so his caloric intake has been less than usual.  Today he feels well and has no complaints.  In the ED CBC was normal and feces were negative for blood.  Malignancy work-up has been negative so far.  HPI   Prior to Admission medications   Medication Sig Start Date End Date Taking? Authorizing Provider  amLODipine (NORVASC) 5 MG tablet Take 1 tablet (5 mg total) by mouth daily. 07/16/19 10/14/19 Yes SagardiaInes Bloomer, MD  aspirin 81 MG tablet Take 81 mg by mouth daily.   Yes [provider]  OVER THE COUNTER MEDICATION daily.   Yes [provider]  esomeprazole (NEXIUM) 40 MG capsule Take 1 capsule (40 mg total) by mouth daily. 30 min before  breakfast Patient not taking: Reported on 07/16/2019 07/08/19   Mauri Pole, MD  lubiprostone (AMITIZA) 24 MCG capsule Take 1 capsule (24 mcg total) by mouth 2 (two) times daily with a meal. Patient not taking: Reported on 07/23/2019 04/11/19   Mauri Pole, MD  pravastatin (PRAVACHOL) 40 MG tablet Take 1 tablet (40 mg total) by mouth daily. TAKE 1 TABLET BY MOUTH EVERY DAY IN THE EVENING 04/04/19 07/03/19  Horald Pollen, MD  vardenafil (LEVITRA) 20 MG tablet Take 20 mg by mouth as needed.    02/29/12  [provider]    Allergies  Allergen Reactions  . Sulfonamide Derivatives Other (See Comments)     blisters on skin    Patient Active Problem List   Diagnosis Date Noted  . Diverticulosis 04/04/2019  . Internal hemorrhoids 04/04/2019  . 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 S/P right colectomy 08/31/2017  . Neuropathy 11/21/2016  . 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 History of pulmonary embolism 07/18/2014  . 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 Coronary atherosclerosis of native coronary artery 03/18/2014  . Hyperlipidemia 11/13/2013  . 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 Umbilical hernia 56/43/3295  . Prostate cancer ([place]) 11/09/2012  . Stricture and stenosis of esophagus 05/31/2012  . Esophageal reflux 10/09/2007  . Dyslipidemia 07/16/2007  . Depression 07/16/2007  . COLONIC POLYPS, HX OF 07/16/2007    Past Medical History:  Diagnosis Date  . Anxiety   . BPH (benign prostatic hypertrophy)   . Decreased white blood cell count   . Depression   . Diverticulosis   . Erectile dysfunction   . Gastric polyps   . GERD (gastroesophageal reflux disease)   . History of colonic polyps   . History of esophageal stricture 10/31/2007  . History of kidney stones   . History of prostate surgery   . History  of rheumatic fever   . Hyperlipidemia   . Hypertension   . Prostate cancer ([place]) 11/09/2012  . Prostatism     Past Surgical History:  Procedure Laterality Date  . CARPAL TUNNEL RELEASE     Bilateral  . ESOPHAGEAL DILATION     last dilation 8'12  . INGUINAL HERNIA REPAIR    . INSERTION OF MESH N/A 04/15/2013   Procedure: INSERTION OF MESH;  Surgeon: Odis Hollingshead, MD;  Location: [facility];  Service: General;  Laterality: N/A;  . KNEE ARTHROSCOPY     left  . LAPAROSCOPIC RIGHT HEMI COLECTOMY Right 08/31/2017   Procedure: LAPAROSCOPIC ASSISTED  RIGHT HEMI COLECTOMY;  Surgeon: Johnathan Hausen, MD;   Location: [facility];  Service: General;  Laterality: Right;  . LEFT HEART CATHETERIZATION WITH CORONARY ANGIOGRAM N/A 07/18/2014   Procedure: LEFT HEART CATHETERIZATION WITH CORONARY ANGIOGRAM;  Surgeon: Burnell Blanks, MD;  Location[facility];  Service: Cardiovascular;  Laterality: N/A;  . ROBOT ASSISTED LAPAROSCOPIC RADICAL PROSTATECTOMY  11/08/2012   Procedure: ROBOTIC ASSISTED LAPAROSCOPIC RADICAL PROSTATECTOMY LEVEL 1;  Surgeon: Molli Hazard, MD;  Location: [facility];  Service: Urology;  Laterality: N/A;     . TONSILLECTOMY    . UMBILICAL HERNIA REPAIR N/A 04/15/2013   Procedure: HERNIA REPAIR UMBILICAL ADULT;  Surgeon: Odis Hollingshead, MD;  Location: [facility];  Service: General;  Laterality: N/A;    Social History   Socioeconomic History  . Marital status: Married    Spouse name: Not on file  . Number of children: 4  . Years of education: Not on file  . Highest education level: Not on file  Occupational History  . Occupation: Mining engineer: [employer]  . Financial resource strain: Not on file  . Food insecurity    Worry: Not on file    Inability: Not on file  . Transportation needs    Medical: Not on file    Non-medical: Not on file  Tobacco Use  . Smoking status: Former Smoker    Packs/day: 1.00    Years: 25.00    Pack years: 25.00    Types: Cigarettes    Quit date: 12/05/1993    Years since quitting: 25.6  . Smokeless tobacco: Never Used  Substance and Sexual Activity  . Alcohol use: No  . Drug use: No  . Sexual activity: Yes  Lifestyle  . Physical activity    Days per week: Not on file    Minutes per session: Not on file  . Stress: Not on file  Relationships  . Social Herbalist on phone: Not on file    Gets together: Not on file    Attends religious service: Not on file    Active member of club or organization: Not on file    Attends meetings of clubs or organizations: Not on file    Relationship  status: Not on file  . Intimate partner violence    Fear of current or ex partner: Not on file    Emotionally abused: Not on file    Physically abused: Not on file    Forced sexual activity: Not on file  Other Topics Concern  . Not on file  Social History Narrative   Lives with wife in a one story home.  Has 4 children.  Works as a Theme park manager.     Education: 10th grade.     Family History  Problem Relation  Age of Onset  . Ovarian cancer Mother   . Colon cancer Mother 60  . High Cholesterol Mother   . Stroke Father   . Lung cancer Maternal Uncle   . Lung cancer Maternal Aunt   . Esophageal cancer Neg Hx   . Stomach cancer Neg Hx   . Rectal cancer Neg Hx   . Pancreatic cancer Neg Hx   . Liver disease Neg Hx      Review of Systems  Constitutional: Positive for weight loss. Negative for chills and fever.  HENT: Negative.  Negative for congestion and sore throat.   Eyes: Negative.   Respiratory: Negative.  Negative for cough and shortness of breath.   Cardiovascular: Negative for chest pain, palpitations and leg swelling.  Gastrointestinal: Positive for heartburn. Negative for abdominal pain, blood in stool, diarrhea, melena, nausea and vomiting.  Genitourinary: Negative.  Negative for dysuria and hematuria.  Musculoskeletal: Negative.  Negative for back pain, myalgias and neck pain.  Skin: Negative.  Negative for rash.  Neurological: Negative.  Negative for dizziness and headaches.  Endo/Heme/Allergies: Negative.   All other systems reviewed and are negative.  Vitals:   07/23/19 0912  BP: 121/75  Pulse: 83  Resp: 16  Temp: 98.3 F (36.8 C)  SpO2: 100%     Physical Exam Vitals signs reviewed.  Constitutional:      Appearance: Normal appearance. He is well-developed.  HENT:     Head: Normocephalic.     Nose: Nose normal.     Mouth/Throat:     Mouth: Mucous membranes are moist.     Pharynx: Oropharynx is clear.  Eyes:     Conjunctiva/sclera: Conjunctivae normal.      Pupils: Pupils are equal, round, and reactive to light.  Neck:     Musculoskeletal: Normal range of motion and neck supple.  Cardiovascular:     Rate and Rhythm: Normal rate and regular rhythm.     Pulses: Normal pulses.     Heart sounds: Normal heart sounds.  Pulmonary:     Effort: Pulmonary effort is normal.     Breath sounds: Normal breath sounds.  Abdominal:     General: Bowel sounds are normal. There is no distension.     Palpations: Abdomen is soft. There is no mass.     Tenderness: There is no abdominal tenderness. There is no guarding.  Musculoskeletal: Normal range of motion.        General: No swelling or tenderness.     Right lower leg: No edema.     Left lower leg: No edema.  Skin:    General: Skin is warm and dry.     Capillary Refill: Capillary refill takes less than 2 seconds.  Neurological:     General: No focal deficit present.     Mental Status: He is alert and oriented to person, place, and time.  Psychiatric:        Mood and Affect: Mood normal.        Behavior: Behavior normal.      ASSESSMENT & PLAN: Andrius was seen today for hospitalization follow-up and sore throat.  Diagnoses and all orders for this visit:  Gastroesophageal reflux disease without esophagitis  Essential hypertension  Loss of weight  History of prostate cancer    Patient Instructions       If you have lab work done today you will be contacted with your lab results within the next 2 weeks.  If you have not heard from  [place] then please contact us. The fastest way to get your results is to register for My Chart.   IF you received an x-ray today, you will receive an invoice from [facility]. Please contact [facility] at [phone] with questions or concerns regarding your invoice.   IF you received labwork today, you will receive an invoice from [facility]. Please contact [facility] at [phone] with questions or concerns regarding your invoice.   Our  billing staff will not be able to assist you with questions regarding bills from these companies.  You will be contacted with the lab results as soon as they are available. The fastest way to get your results is to activate your My Chart account. Instructions are located on the last page of this paperwork. If you have not heard from [place] regarding the results in 2 weeks, please contact this office.      Food Choices for Gastroesophageal Reflux Disease, Adult When you have gastroesophageal reflux disease (GERD), the foods you eat and your eating habits are very important. Choosing the right foods can help ease your discomfort. Think about working with a nutrition specialist (dietitian) to help you make good choices. What are tips for following this plan?  Meals  Choose healthy foods that are low in fat, such as fruits, vegetables, whole grains, low-fat dairy products, and lean meat, fish, and poultry.  Eat small meals often instead of 3 large meals a day. Eat your meals slowly, and in a place where you are relaxed. Avoid bending over or lying down until 2-3 hours after eating.  Avoid eating meals 2-3 hours before bed.  Avoid drinking a lot of liquid with meals.  Cook foods using methods other than frying. Bake, grill, or broil food instead.  Avoid or limit: ? Chocolate. ? Peppermint or spearmint. ? Alcohol. ? Pepper. ? Black and decaffeinated coffee. ? Black and decaffeinated tea. ? Bubbly (carbonated) soft drinks. ? Caffeinated energy drinks and soft drinks.  Limit high-fat foods such as: ? Fatty meat or fried foods. ? Whole milk, cream, butter, or ice cream. ? Nuts and nut butters. ? Pastries, donuts, and sweets made with butter or shortening.  Avoid foods that cause symptoms. These foods may be different for everyone. Common foods that cause symptoms include: ? Tomatoes. ? Oranges, lemons, and limes. ? Peppers. ? Spicy food. ? Onions and garlic. ? Vinegar. Lifestyle   Maintain a healthy weight. Ask your doctor what weight is healthy for you. If you need to lose weight, work with your doctor to do so safely.  Exercise for at least 30 minutes for 5 or more days each week, or as told by your doctor.  Wear loose-fitting clothes.  Do not smoke. If you need help quitting, ask your doctor.  Sleep with the head of your bed higher than your feet. Use a wedge under the mattress or blocks under the bed frame to raise the head of the bed. Summary  When you have gastroesophageal reflux disease (GERD), food and lifestyle choices are very important in easing your symptoms.  Eat small meals often instead of 3 large meals a day. Eat your meals slowly, and in a place where you are relaxed.  Limit high-fat foods such as fatty meat or fried foods.  Avoid bending over or lying down until 2-3 hours after eating.  Avoid peppermint and spearmint, caffeine, alcohol, and chocolate. This information is not intended to replace advice given to you by your health care provider. Make sure  you discuss any questions you have with your health care provider. Document Released: 05/22/2012 Document Revised: 03/14/2019 Document Reviewed: 12/27/2016 Elsevier Patient Education  2020 Elsevier Inc.      Agustina Caroli, MD [facility]

## 2019-07-23 NOTE — Patient Instructions (Addendum)
   If you have lab work done today you will be contacted with your lab results within the next 2 weeks.  If you have not heard from us then please contact us. The fastest way to get your results is to register for My Chart.   IF you received an x-ray today, you will receive an invoice from [facility]. Please contact [facility] at [phone] with questions or concerns regarding your invoice.   IF you received labwork today, you will receive an invoice from [facility]. Please contact [facility] at [phone] with questions or concerns regarding your invoice.   Our billing staff will not be able to assist you with questions regarding bills from these companies.  You will be contacted with the lab results as soon as they are available. The fastest way to get your results is to activate your My Chart account. Instructions are located on the last page of this paperwork. If you have not heard from us regarding the results in 2 weeks, please contact this office.     Food Choices for Gastroesophageal Reflux Disease, Adult When you have gastroesophageal reflux disease (GERD), the foods you eat and your eating habits are very important. Choosing the right foods can help ease your discomfort. Think about working with a nutrition specialist (dietitian) to help you make good choices. What are tips for following this plan?  Meals  Choose healthy foods that are low in fat, such as fruits, vegetables, whole grains, low-fat dairy products, and lean meat, fish, and poultry.  Eat small meals often instead of 3 large meals a day. Eat your meals slowly, and in a place where you are relaxed. Avoid bending over or lying down until 2-3 hours after eating.  Avoid eating meals 2-3 hours before bed.  Avoid drinking a lot of liquid with meals.  Cook foods using methods other than frying. Bake, grill, or broil food instead.  Avoid or limit: ? Chocolate. ? Peppermint or  spearmint. ? Alcohol. ? Pepper. ? Black and decaffeinated coffee. ? Black and decaffeinated tea. ? Bubbly (carbonated) soft drinks. ? Caffeinated energy drinks and soft drinks.  Limit high-fat foods such as: ? Fatty meat or fried foods. ? Whole milk, cream, butter, or ice cream. ? Nuts and nut butters. ? Pastries, donuts, and sweets made with butter or shortening.  Avoid foods that cause symptoms. These foods may be different for everyone. Common foods that cause symptoms include: ? Tomatoes. ? Oranges, lemons, and limes. ? Peppers. ? Spicy food. ? Onions and garlic. ? Vinegar. Lifestyle  Maintain a healthy weight. Ask your doctor what weight is healthy for you. If you need to lose weight, work with your doctor to do so safely.  Exercise for at least 30 minutes for 5 or more days each week, or as told by your doctor.  Wear loose-fitting clothes.  Do not smoke. If you need help quitting, ask your doctor.  Sleep with the head of your bed higher than your feet. Use a wedge under the mattress or blocks under the bed frame to raise the head of the bed. Summary  When you have gastroesophageal reflux disease (GERD), food and lifestyle choices are very important in easing your symptoms.  Eat small meals often instead of 3 large meals a day. Eat your meals slowly, and in a place where you are relaxed.  Limit high-fat foods such as fatty meat or fried foods.  Avoid bending over or lying down until 2-3 hours after   after eating.  Avoid peppermint and spearmint, caffeine, alcohol, and chocolate. This information is not intended to replace advice given to you by your health care provider. Make sure you discuss any questions you have with your health care provider. Document Released: 05/22/2012 Document Revised: 03/14/2019 Document Reviewed: 12/27/2016 Elsevier Patient Education  2020 [employer].

## 2019-07-25 ENCOUNTER — Ambulatory Visit (HOSPITAL_COMMUNITY): Payer: Medicare Other | Admitting: Hematology

## 2019-08-01 DIAGNOSIS — Z23 Encounter for immunization: Secondary | ICD-10-CM | POA: Diagnosis not present

## 2019-08-07 ENCOUNTER — Ambulatory Visit: Payer: Medicare Other | Admitting: Cardiology

## 2019-08-19 ENCOUNTER — Ambulatory Visit (INDEPENDENT_AMBULATORY_CARE_PROVIDER_SITE_OTHER): Payer: Medicare Other | Admitting: Gastroenterology

## 2019-08-19 ENCOUNTER — Encounter: Payer: Self-pay | Admitting: Gastroenterology

## 2019-08-19 ENCOUNTER — Other Ambulatory Visit: Payer: Self-pay

## 2019-08-19 VITALS — BP 110/70 | HR 84 | Temp 98.2°F | Ht 73.0 in | Wt 179.5 lb

## 2019-08-19 DIAGNOSIS — K219 Gastro-esophageal reflux disease without esophagitis: Secondary | ICD-10-CM

## 2019-08-19 DIAGNOSIS — D099 Carcinoma in situ, unspecified: Secondary | ICD-10-CM | POA: Diagnosis not present

## 2019-08-19 DIAGNOSIS — K5909 Other constipation: Secondary | ICD-10-CM

## 2019-08-19 DIAGNOSIS — R1310 Dysphagia, unspecified: Secondary | ICD-10-CM | POA: Diagnosis not present

## 2019-08-19 DIAGNOSIS — Z9049 Acquired absence of other specified parts of digestive tract: Secondary | ICD-10-CM | POA: Diagnosis not present

## 2019-08-19 DIAGNOSIS — R634 Abnormal weight loss: Secondary | ICD-10-CM | POA: Diagnosis not present

## 2019-08-19 NOTE — Progress Notes (Signed)
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   He denies any loss of appetite but he changed his diet because he felt his symptoms were worse when ever he ate. Since he started taking TUMS little bit better. Denies any food impactions. He is no longer taking Nexium or any OTC PPI.   He is having BM once 2-3, with Miralax as needed   Status post right hemicolectomy for tubulovillous adenoma with high-grade dysplasia, negative margins and negative lymph nodes  Colonoscopy October 25, 2018: Adequate prep to identify polyps greater than 6 mm in size, ileocolonic anastomosis, left side colonic diverticulosis and internal hemorrhoids.  Colonoscopy July 12, 2017: 25 mm polyp, lateral spreading and cecum [tubulovillous adenoma with high-grade dysplasia].  2 subcentimeter polyps removed from descending and transverse colon [tubular adenoma], diverticulosis and internal hemorrhoids  Colonoscopy May 03, 2012 by Dr. Deatra Ina: Fair bowel prep, sigmoid diverticulosis otherwise normal exam, was recommended recall in 10 years  EGD May 03, 2012 by Dr. Deatra Ina: Pedunculated polyp [hyperplastic] removed from the antrum, multiple sessile fundic gland polyps, esophageal stricture status post dilation with 18 mm Maloney dilator  Outpatient Encounter Medications as of 08/19/2019  Medication Sig  . esomeprazole (NEXIUM) 40 MG capsule Take 1 capsule (40 mg  total) by mouth daily. 30 min before  breakfast  . lubiprostone (AMITIZA) 24 MCG capsule Take 1 capsule (24 mcg total) by mouth 2 (two) times daily with a meal. (Patient taking differently: Take 24 mcg by mouth as needed. )  . OVER THE COUNTER MEDICATION daily.  . pravastatin (PRAVACHOL) 40 MG tablet Take 1 tablet (40 mg total) by mouth daily. TAKE 1 TABLET BY MOUTH EVERY DAY IN THE EVENING  . [DISCONTINUED] amLODipine (NORVASC) 5 MG tablet Take 1 tablet (5 mg total) by mouth daily.  . [DISCONTINUED] aspirin 81 MG tablet Take 81 mg by mouth daily.  . [DISCONTINUED] vardenafil (LEVITRA) 20 MG tablet Take 20 mg by mouth as needed.     Facility-Administered Encounter Medications as of 08/19/2019  Medication  . 0.9 %  sodium chloride infusion    Allergies as of 08/19/2019 - Review Complete 07/23/2019  Allergen Reaction Noted  . Sulfonamide derivatives Other (See Comments) 06/11/2008    Past Medical History:  Diagnosis Date  . Anxiety   . BPH (benign prostatic hypertrophy)   . Decreased white blood cell count   . Depression   . Diverticulosis   . Erectile dysfunction   . Gastric polyps   . GERD (gastroesophageal reflux disease)   . History of colonic polyps   . History of esophageal stricture 10/31/2007  . History of kidney stones   . History of prostate surgery   . History of rheumatic fever   . Hyperlipidemia   . Hypertension   . Prostate cancer ([facility]) 11/09/2012  . Prostatism     Past Surgical History:  Procedure Laterality Date  . CARPAL TUNNEL RELEASE     Bilateral  .  ESOPHAGEAL DILATION     last dilation 8'12  . INGUINAL HERNIA REPAIR    . INSERTION OF MESH N/A 04/15/2013   Procedure: INSERTION OF MESH;  Surgeon: Odis Hollingshead, MD;  Location: [facility];  Service: General;  Laterality: N/A;  . KNEE ARTHROSCOPY     left  . LAPAROSCOPIC RIGHT HEMI COLECTOMY Right 08/31/2017   Procedure: LAPAROSCOPIC ASSISTED  RIGHT HEMI COLECTOMY;  Surgeon: Johnathan Hausen, MD;  Location:  [facility];  Service: General;  Laterality: Right;  . LEFT HEART CATHETERIZATION WITH CORONARY ANGIOGRAM N/A 07/18/2014   Procedure: LEFT HEART CATHETERIZATION WITH CORONARY ANGIOGRAM;  Surgeon: Burnell Blanks, MD;  Location: [facility];  Service: Cardiovascular;  Laterality: N/A;  . ROBOT ASSISTED LAPAROSCOPIC RADICAL PROSTATECTOMY  11/08/2012   Procedure: ROBOTIC ASSISTED LAPAROSCOPIC RADICAL PROSTATECTOMY LEVEL 1;  Surgeon: Molli Hazard, MD;  Location: [facility];  Service: Urology;  Laterality: N/A;     . TONSILLECTOMY    . UMBILICAL HERNIA REPAIR N/A 04/15/2013   Procedure: HERNIA REPAIR UMBILICAL ADULT;  Surgeon: Odis Hollingshead, MD;  Location: [facility];  Service: General;  Laterality: N/A;    Family History  Problem Relation Age of Onset  . Ovarian cancer Mother   . Colon cancer Mother 5  . High Cholesterol Mother   . Stroke Father   . Lung cancer Maternal Uncle   . Lung cancer Maternal Aunt   . Esophageal cancer Neg Hx   . Stomach cancer Neg Hx   . Rectal cancer Neg Hx   . Pancreatic cancer Neg Hx   . Liver disease Neg Hx     Social History   Socioeconomic History  . Marital status: Married    Spouse name: Not on file  . Number of children: 4  . Years of education: Not on file  . Highest education level: Not on file  Occupational History  . Occupation: Mining engineer: [employer]  . Financial resource strain: Not on file  . Food insecurity    Worry: Not on file    Inability: Not on file  . Transportation needs    Medical: Not on file    Non-medical: Not on file  Tobacco Use  . Smoking status: Former Smoker    Packs/day: 1.00    Years: 25.00    Pack years: 25.00    Types: Cigarettes    Quit date: 12/05/1993    Years since quitting: 25.7  . Smokeless tobacco: Never Used  Substance and Sexual Activity  . Alcohol use: No  . Drug use: No  . Sexual activity: Yes  Lifestyle  . Physical activity    Days per week: Not  on file    Minutes per session: Not on file  . Stress: Not on file  Relationships  . Social Herbalist on phone: Not on file    Gets together: Not on file    Attends religious service: Not on file    Active member of club or organization: Not on file    Attends meetings of clubs or organizations: Not on file    Relationship status: Not on file  . Intimate partner violence    Fear of current or ex partner: Not on file    Emotionally abused: Not on file    Physically abused: Not on file    Forced sexual activity: Not on file  Other Topics Concern  . Not on file  Social History Narrative   Lives with wife in a one story home.  Has 4 children.  Works as a Theme park manager.     Education: 10th grade.       Review of systems: Review of Systems  Constitutional: Negative for fever and chills. Positive for lack of energy HENT: Negative.   Eyes: Negative for blurred vision.  Respiratory: Positive for cough, shortness of breath and wheezing.   Cardiovascular: Negative for chest pain and palpitations.  Gastrointestinal: as per HPI Genitourinary: Negative for dysuria, urgency and hematuria. Positive for increased frequency Musculoskeletal: Negative for myalgias, back pain and joint pain.  Skin: Negative for itching and rash.  Neurological: Negative for dizziness, tremors, focal weakness, seizures and loss of consciousness. Positive for difficulty with balance. Endo/Heme/Allergies: Positive for seasonal allergies.  Psychiatric/Behavioral: Negative for depression, suicidal ideas and hallucinations.  All other systems reviewed and are negative.   Physical Exam: Vitals:   08/19/19 1317  BP: 110/70  Pulse: 84  Temp: 98.2 F (36.8 C)   Body mass index is 23.68 kg/m. Gen:      No acute distress HEENT:  EOMI, sclera anicteric Neck:     No masses; no thyromegaly Lungs:    Clear to auscultation bilaterally; normal respiratory effort CV:         Regular rate and rhythm; no murmurs  Abd:      + bowel sounds; soft, non-tender; no palpable masses, no distension Ext:    No edema; adequate peripheral perfusion Skin:      Warm and dry; no rash Neuro: alert and oriented x 3 Psych: normal mood and affect  Data Reviewed:  Reviewed labs, radiology imaging, old records and pertinent past GI work up   Assessment and Plan/Recommendations:  56 yr M with h/o chronic GERD, esophageal stricture s/p dilation in 2013 with complaints of worsening reflux symptoms, intermittent dysphagia and significant weight loss.  Will schedule for EGD with biopsies and possible dilation if needed The risks and benefits as well as alternatives of endoscopic procedure(s) have been discussed and reviewed. All questions answered. The patient agrees to proceed.  Discussed anti reflux measures Continue TUMS as needed Will consider starting PPI or H2 blocker based on EGD findings  Chronic Constipation Trial of Linzess 120mcg daily Ok to use Miralax as needed Increase dietary fiber and fluid intake  H/o tubulovillous adenoma with high grade dysplasia s/p R hemicolectomy. Current with surveillance colonoscopy  Return in 3 months or sooner if needed   K. Denzil Magnuson , MD    CC: Horald Pollen, *

## 2019-08-19 NOTE — Patient Instructions (Signed)
You have been scheduled for an endoscopy. Please follow written instructions given to you at your visit today. If you use inhalers (even only as needed), please bring them with you on the day of your procedure.   If you are age 73 or older, your body mass index should be between 23-30. Your Body mass index is 23.68 kg/m. If this is out of the aforementioned range listed, please consider follow up with your Primary Care Provider.  If you are age 44 or younger, your body mass index should be between 19-25. Your Body mass index is 23.68 kg/m. If this is out of the aformentioned range listed, please consider follow up with your Primary Care Provider.    We will give you Linzess 145 mcg samples today  I appreciate the  opportunity to care for you  Thank You   Harl Bowie , MD

## 2019-08-20 DIAGNOSIS — C61 Malignant neoplasm of prostate: Secondary | ICD-10-CM | POA: Diagnosis not present

## 2019-08-20 DIAGNOSIS — N393 Stress incontinence (female) (male): Secondary | ICD-10-CM | POA: Diagnosis not present

## 2019-08-20 DIAGNOSIS — N5201 Erectile dysfunction due to arterial insufficiency: Secondary | ICD-10-CM | POA: Diagnosis not present

## 2019-08-20 DIAGNOSIS — D3501 Benign neoplasm of right adrenal gland: Secondary | ICD-10-CM | POA: Diagnosis not present

## 2019-08-20 DIAGNOSIS — D3502 Benign neoplasm of left adrenal gland: Secondary | ICD-10-CM | POA: Diagnosis not present

## 2019-08-30 ENCOUNTER — Encounter: Payer: Medicare Other | Admitting: Gastroenterology

## 2019-09-19 DIAGNOSIS — D3502 Benign neoplasm of left adrenal gland: Secondary | ICD-10-CM | POA: Diagnosis not present

## 2019-09-19 DIAGNOSIS — D3501 Benign neoplasm of right adrenal gland: Secondary | ICD-10-CM | POA: Diagnosis not present

## 2019-09-19 DIAGNOSIS — C61 Malignant neoplasm of prostate: Secondary | ICD-10-CM | POA: Diagnosis not present

## 2019-10-25 ENCOUNTER — Other Ambulatory Visit: Payer: Self-pay

## 2019-10-25 DIAGNOSIS — Z20822 Contact with and (suspected) exposure to covid-19: Secondary | ICD-10-CM

## 2019-10-28 ENCOUNTER — Telehealth: Payer: Self-pay

## 2019-10-28 LAB — NOVEL CORONAVIRUS, NAA: SARS-CoV-2, NAA: NOT DETECTED

## 2019-10-28 NOTE — Telephone Encounter (Signed)
Patient called and he was informed that his COVID-19 test was negative and he was not infected with the Novel Coronavirus.  He verbalized understanding.

## 2020-01-13 ENCOUNTER — Ambulatory Visit: Payer: Medicare Other | Attending: Internal Medicine

## 2020-01-13 DIAGNOSIS — Z23 Encounter for immunization: Secondary | ICD-10-CM | POA: Insufficient documentation

## 2020-01-13 NOTE — Progress Notes (Signed)
   [id number]  Name:  Omar [name].    MRN: [id number] DOB: 1946-11-15  01/13/2020  Omar [name] was observed post Covid-19 immunization for 15 minutes without incidence. He was provided with Vaccine Information Sheet and instruction to access the V-Safe system.   Omar [name] was instructed to call 911 with any severe reactions post vaccine: Marland Kitchen Difficulty breathing  . Swelling of your face and throat  . A fast heartbeat  . A bad rash all over your body  . Dizziness and weakness    Immunizations Administered    Name Date Dose VIS Date Route   Pfizer COVID-19 Vaccine 01/13/2020  8:20 AM 0.3 mL 11/15/2019 Intramuscular   Manufacturer: [place]   Lot: [id number]   [place]: [id number]

## 2020-01-16 ENCOUNTER — Ambulatory Visit: Payer: Medicare Other

## 2020-01-21 ENCOUNTER — Encounter: Payer: Self-pay | Admitting: Emergency Medicine

## 2020-01-21 ENCOUNTER — Other Ambulatory Visit: Payer: Self-pay

## 2020-01-21 ENCOUNTER — Ambulatory Visit (INDEPENDENT_AMBULATORY_CARE_PROVIDER_SITE_OTHER): Payer: Medicare Other | Admitting: Emergency Medicine

## 2020-01-21 VITALS — BP 120/71 | HR 67 | Temp 98.1°F | Resp 16 | Ht 75.0 in | Wt 192.0 lb

## 2020-01-21 DIAGNOSIS — K219 Gastro-esophageal reflux disease without esophagitis: Secondary | ICD-10-CM

## 2020-01-21 DIAGNOSIS — I10 Essential (primary) hypertension: Secondary | ICD-10-CM

## 2020-01-21 DIAGNOSIS — E7800 Pure hypercholesterolemia, unspecified: Secondary | ICD-10-CM

## 2020-01-21 NOTE — Patient Instructions (Addendum)
   If you have lab work done today you will be contacted with your lab results within the next 2 weeks.  If you have not heard from us then please contact us. The fastest way to get your results is to register for My Chart.   IF you received an x-ray today, you will receive an invoice from [facility]. Please contact [facility] at [phone] with questions or concerns regarding your invoice.   IF you received labwork today, you will receive an invoice from [facility]. Please contact [facility] at [phone] with questions or concerns regarding your invoice.   Our billing staff will not be able to assist you with questions regarding bills from these companies.  You will be contacted with the lab results as soon as they are available. The fastest way to get your results is to activate your My Chart account. Instructions are located on the last page of this paperwork. If you have not heard from us regarding the results in 2 weeks, please contact this office.     Health Maintenance After Age 65 After age 65, you are at a higher risk for certain [place]-term diseases and infections as well as injuries from falls. Falls are a major cause of broken bones and head injuries in people who are older than age 65. Getting regular preventive care can help to keep you healthy and well. Preventive care includes getting regular testing and making lifestyle changes as recommended by your health care provider. Talk with your health care provider about:  Which screenings and tests you should have. A screening is a test that checks for a disease when you have no symptoms.  A diet and exercise plan that is right for you. What should I know about screenings and tests to prevent falls? Screening and testing are the best ways to find a health problem early. Early diagnosis and treatment give you the best chance of managing medical conditions that are common after age 65. Certain conditions and  lifestyle choices may make you more likely to have a fall. Your health care provider may recommend:  Regular vision checks. Poor vision and conditions such as cataracts can make you more likely to have a fall. If you wear glasses, make sure to get your prescription updated if your vision changes.  Medicine review. Work with your health care provider to regularly review all of the medicines you are taking, including over-the-counter medicines. Ask your health care provider about any side effects that may make you more likely to have a fall. Tell your health care provider if any medicines that you take make you feel dizzy or sleepy.  Osteoporosis screening. Osteoporosis is a condition that causes the bones to get weaker. This can make the bones weak and cause them to break more easily.  Blood pressure screening. Blood pressure changes and medicines to control blood pressure can make you feel dizzy.  Strength and balance checks. Your health care provider may recommend certain tests to check your strength and balance while standing, walking, or changing positions.  Foot health exam. Foot pain and numbness, as well as not wearing proper footwear, can make you more likely to have a fall.  Depression screening. You may be more likely to have a fall if you have a fear of falling, feel emotionally low, or feel unable to do activities that you used to do.  Alcohol use screening. Using too much alcohol can affect your balance and may make you more likely to   have a fall. What actions can I take to lower my risk of falls? General instructions  Talk with your health care provider about your risks for falling. Tell your health care provider if: ? You fall. Be sure to tell your health care provider about all falls, even ones that seem minor. ? You feel dizzy, sleepy, or off-balance.  Take over-the-counter and prescription medicines only as told by your health care provider. These include any  supplements.  Eat a healthy diet and maintain a healthy weight. A healthy diet includes low-fat dairy products, low-fat (lean) meats, and fiber from whole grains, beans, and lots of fruits and vegetables. Home safety  Remove any tripping hazards, such as rugs, cords, and clutter.  Install safety equipment such as grab bars in bathrooms and safety rails on stairs.  Keep rooms and walkways well-lit. Activity   Follow a regular exercise program to stay fit. This will help you maintain your balance. Ask your health care provider what types of exercise are appropriate for you.  If you need a cane or walker, use it as recommended by your health care provider.  Wear supportive shoes that have nonskid soles. Lifestyle  Do not drink alcohol if your health care provider tells you not to drink.  If you drink alcohol, limit how much you have: ? 0-1 drink a day for women. ? 0-2 drinks a day for men.  Be aware of how much alcohol is in your drink. In the [place] one drink equals one typical bottle of beer (12 oz), one-half glass of wine (5 oz), or one shot of hard liquor (1 oz).  Do not use any products that contain nicotine or tobacco, such as cigarettes and e-cigarettes. If you need help quitting, ask your health care provider. Summary  Having a healthy lifestyle and getting preventive care can help to protect your health and wellness after age 65.  Screening and testing are the best way to find a health problem early and help you avoid having a fall. Early diagnosis and treatment give you the best chance for managing medical conditions that are more common for people who are older than age 65.  Falls are a major cause of broken bones and head injuries in people who are older than age 65. Take precautions to prevent a fall at home.  Work with your health care provider to learn what changes you can make to improve your health and wellness and to prevent falls. This information is not intended  to replace advice given to you by your health care provider. Make sure you discuss any questions you have with your health care provider. Document Revised: 03/14/2019 Document Reviewed: 10/04/2017 Elsevier Patient Education  2020 [employer]  

## 2020-01-21 NOTE — Progress Notes (Signed)
Omar [name]. 74 y.o.   Chief Complaint  Patient presents with  . Gastroesophageal Reflux    6 MONTH FOLLOW UP  . Hypertension    HISTORY OF PRESENT ILLNESS: This is a 74 y.o. male with history of GERD and hypertension here for follow-up. Saw GI doctor last September.  Office visit notes reviewed. Patient is doing much better.  Has no complaints or medical concerns today. Taking no medications for either reflux or hypertension. Also has history of dyslipidemia, takes pravastatin. 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  Much improved now. Asymptomatic.  HPI   Prior to Admission medications   Medication Sig Start Date End Date Taking? Authorizing Provider  OVER THE COUNTER MEDICATION daily.   Yes [provider]  esomeprazole (NEXIUM) 40 MG capsule Take 1 capsule (40 mg total) by mouth daily. 30 min before  breakfast Patient not taking: Reported on 01/21/2020 07/08/19   Mauri Pole, MD  lubiprostone (AMITIZA) 24 MCG capsule Take 1 capsule (24 mcg total) by mouth 2 (two) times daily with a meal. Patient not taking: Reported on 01/21/2020 04/11/19   Mauri Pole, MD  pravastatin (PRAVACHOL) 40 MG tablet Take 1 tablet (40 mg total) by mouth daily. TAKE 1 TABLET BY MOUTH EVERY DAY IN THE EVENING 04/04/19 08/19/19  Horald Pollen, MD  vardenafil (LEVITRA) 20 MG tablet Take 20 mg by mouth as needed.    02/29/12  [provider]    Allergies  Allergen Reactions  . Sulfonamide Derivatives Other (See Comments)     blisters on skin    Patient Active Problem List   Diagnosis Date Noted  . Diverticulosis 04/04/2019  . Internal hemorrhoids 04/04/2019  . 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 S/P right colectomy 08/31/2017  . Neuropathy 11/21/2016  . 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 History of pulmonary embolism  07/18/2014  . 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 Coronary atherosclerosis of native coronary artery 03/18/2014  . Hyperlipidemia 11/13/2013  . 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 Prostate cancer ([place]) 11/09/2012  . Stricture and stenosis of esophagus 05/31/2012  . Esophageal reflux 10/09/2007  . Dyslipidemia 07/16/2007  . Depression 07/16/2007  . COLONIC POLYPS, HX OF 07/16/2007    Past Medical History:  Diagnosis Date  . Anxiety   . BPH (benign prostatic hypertrophy)   . Decreased white blood cell count   . Depression   . Diverticulosis   . Erectile dysfunction   . Gastric polyps   . GERD (gastroesophageal reflux disease)   . History of colonic polyps   . History of esophageal stricture 10/31/2007  . History of kidney stones   . History of prostate surgery   . History of rheumatic fever   . Hyperlipidemia   . Hypertension   . Prostate cancer ([place]) 11/09/2012  . Prostatism     Past Surgical History:  Procedure Laterality Date  . CARPAL TUNNEL RELEASE     Bilateral  . ESOPHAGEAL DILATION     last dilation 8'12  . INGUINAL HERNIA REPAIR    . INSERTION OF MESH N/A 04/15/2013   Procedure: INSERTION OF MESH;  Surgeon: Odis Hollingshead, MD;  Location: [facility];  Service: General;  Laterality: N/A;  . KNEE ARTHROSCOPY     left  . LAPAROSCOPIC RIGHT HEMI COLECTOMY Right 08/31/2017   Procedure: LAPAROSCOPIC ASSISTED  RIGHT HEMI  COLECTOMY;  Surgeon: Johnathan Hausen, MD;  Location: [facility];  Service: General;  Laterality: Right;  . LEFT HEART CATHETERIZATION WITH CORONARY ANGIOGRAM N/A 07/18/2014   Procedure: LEFT HEART CATHETERIZATION WITH CORONARY ANGIOGRAM;  Surgeon: Burnell Blanks, MD;  Location: [facility] CATH LAB;  Service: Cardiovascular;  Laterality: N/A;  . ROBOT ASSISTED LAPAROSCOPIC RADICAL PROSTATECTOMY  11/08/2012   Procedure: ROBOTIC ASSISTED LAPAROSCOPIC RADICAL PROSTATECTOMY LEVEL 1;  Surgeon: Molli Hazard, MD;  Location: [facility];  Service: Urology;   Laterality: N/A;     . TONSILLECTOMY    . UMBILICAL HERNIA REPAIR N/A 04/15/2013   Procedure: HERNIA REPAIR UMBILICAL ADULT;  Surgeon: Odis Hollingshead, MD;  Location: [facility];  Service: General;  Laterality: N/A;    Social History   Socioeconomic History  . Marital status: Married    Spouse name: Not on file  . Number of children: 4  . Years of education: Not on file  . Highest education level: Not on file  Occupational History  . Occupation: pastor    Employer: [employer]  Tobacco Use  . Smoking status: Former Smoker    Packs/day: 1.00    Years: 25.00    Pack years: 25.00    Types: Cigarettes    Quit date: 12/05/1993    Years since quitting: 26.1  . Smokeless tobacco: Never Used  Substance and Sexual Activity  . Alcohol use: No  . Drug use: No  . Sexual activity: Yes  Other Topics Concern  . Not on file  Social History Narrative   Lives with wife in a one story home.  Has 4 children.  Works as a Theme park manager.     Education: 10th grade.    Social Determinants of Health   Financial Resource Strain:   . Difficulty of Paying Living Expenses: Not on file  Food Insecurity:   . Worried About Charity fundraiser in the Last Year: Not on file  . Ran Out of Food in the Last Year: Not on file  Transportation Needs:   . Lack of Transportation (Medical): Not on file  . Lack of Transportation (Non-Medical): Not on file  Physical Activity:   . Days of Exercise per Week: Not on file  . Minutes of Exercise per Session: Not on file  Stress:   . Feeling of Stress : Not on file  Social Connections:   . Frequency of Communication with Friends and Family: Not on file  . Frequency of Social Gatherings with Friends and Family: Not on file  . Attends Religious Services: Not on file  . Active Member of Clubs or Organizations: Not on file  . Attends Archivist Meetings: Not on file  . Marital Status: Not on file  Intimate Partner Violence:   . Fear of Current or  Ex-Partner: Not on file  . Emotionally Abused: Not on file  . Physically Abused: Not on file  . Sexually Abused: Not on file    Family History  Problem Relation Age of Onset  . Ovarian cancer Mother   . Colon cancer Mother 73  . High Cholesterol Mother   . Stroke Father   . Lung cancer Maternal Uncle   . Lung cancer Maternal Aunt   . Esophageal cancer Neg Hx   . Stomach cancer Neg Hx   . Rectal cancer Neg Hx   . Pancreatic cancer Neg Hx   . Liver disease Neg Hx      Review of Systems  Constitutional:  Negative.  Negative for chills and fever.  HENT: Negative.  Negative for congestion and sore throat.   Respiratory: Negative.  Negative for cough and shortness of breath.   Cardiovascular: Negative for chest pain and palpitations.  Gastrointestinal: Negative.  Negative for abdominal pain, blood in stool, diarrhea, heartburn, melena, nausea and vomiting.  Genitourinary: Negative.  Negative for dysuria and hematuria.  Musculoskeletal: Negative.  Negative for myalgias and neck pain.  Skin: Negative.  Negative for rash.  Neurological: Negative.  Negative for dizziness and headaches.  All other systems reviewed and are negative.   Today's Vitals   01/21/20 0954  BP: 120/71  Pulse: 67  Resp: 16  Temp: 98.1 F (36.7 C)  TempSrc: Temporal  SpO2: 96%  Weight: 192 lb (87.1 kg)  Height: 6\' 3"  (1.905 m)   Body mass index is 24 kg/m.  Physical Exam Vitals reviewed.  Constitutional:      Appearance: Normal appearance.  HENT:     Head: Normocephalic.  Eyes:     Extraocular Movements: Extraocular movements intact.     Conjunctiva/sclera: Conjunctivae normal.     Pupils: Pupils are equal, round, and reactive to light.  Cardiovascular:     Rate and Rhythm: Regular rhythm.     Pulses: Normal pulses.     Heart sounds: Normal heart sounds.  Pulmonary:     Effort: Pulmonary effort is normal.     Breath sounds: Normal breath sounds.  Abdominal:     Palpations: Abdomen is  soft.     Tenderness: There is no abdominal tenderness.  Musculoskeletal:        General: Normal range of motion.     Cervical back: Normal range of motion and neck supple.  Skin:    General: Skin is warm and dry.     Capillary Refill: Capillary refill takes less than 2 seconds.  Neurological:     General: No focal deficit present.     Mental Status: He is alert and oriented to person, place, and time.  Psychiatric:        Mood and Affect: Mood normal.        Behavior: Behavior normal.      ASSESSMENT & PLAN: Clinically stable.  No medical concerns identified during this visit.  Follow-up in 1 year.  Bishoy was seen today for gastroesophageal reflux and hypertension.  Diagnoses and all orders for this visit:  Gastroesophageal reflux disease without esophagitis  Essential hypertension  Pure hypercholesterolemia    Patient Instructions       If you have lab work done today you will be contacted with your lab results within the next 2 weeks.  If you have not heard from [place] then please contact us. The fastest way to get your results is to register for My Chart.   IF you received an x-ray today, you will receive an invoice from [facility]. Please contact [facility] at [phone] with questions or concerns regarding your invoice.   IF you received labwork today, you will receive an invoice from [place]. Please contact [facility] at [phone] with questions or concerns regarding your invoice.   Our billing staff will not be able to assist you with questions regarding bills from these companies.  You will be contacted with the lab results as soon as they are available. The fastest way to get your results is to activate your My Chart account. Instructions are located on the last page of this paperwork. If you have not heard from [place]  regarding the results in 2 weeks, please contact this office.     Health Maintenance After Age 67 After age 29, you are  at a higher risk for certain Rail-term diseases and infections as well as injuries from falls. Falls are a major cause of broken bones and head injuries in people who are older than age 66. Getting regular preventive care can help to keep you healthy and well. Preventive care includes getting regular testing and making lifestyle changes as recommended by your health care provider. Talk with your health care provider about:  Which screenings and tests you should have. A screening is a test that checks for a disease when you have no symptoms.  A diet and exercise plan that is right for you. What should I know about screenings and tests to prevent falls? Screening and testing are the best ways to find a health problem early. Early diagnosis and treatment give you the best chance of managing medical conditions that are common after age 36. Certain conditions and lifestyle choices may make you more likely to have a fall. Your health care provider may recommend:  Regular vision checks. Poor vision and conditions such as cataracts can make you more likely to have a fall. If you wear glasses, make sure to get your prescription updated if your vision changes.  Medicine review. Work with your health care provider to regularly review all of the medicines you are taking, including over-the-counter medicines. Ask your health care provider about any side effects that may make you more likely to have a fall. Tell your health care provider if any medicines that you take make you feel dizzy or sleepy.  Osteoporosis screening. Osteoporosis is a condition that causes the bones to get weaker. This can make the bones weak and cause them to break more easily.  Blood pressure screening. Blood pressure changes and medicines to control blood pressure can make you feel dizzy.  Strength and balance checks. Your health care provider may recommend certain tests to check your strength and balance while standing, walking, or  changing positions.  Foot health exam. Foot pain and numbness, as well as not wearing proper footwear, can make you more likely to have a fall.  Depression screening. You may be more likely to have a fall if you have a fear of falling, feel emotionally low, or feel unable to do activities that you used to do.  Alcohol use screening. Using too much alcohol can affect your balance and may make you more likely to have a fall. What actions can I take to lower my risk of falls? General instructions  Talk with your health care provider about your risks for falling. Tell your health care provider if: ? You fall. Be sure to tell your health care provider about all falls, even ones that seem minor. ? You feel dizzy, sleepy, or off-balance.  Take over-the-counter and prescription medicines only as told by your health care provider. These include any supplements.  Eat a healthy diet and maintain a healthy weight. A healthy diet includes low-fat dairy products, low-fat (lean) meats, and fiber from whole grains, beans, and lots of fruits and vegetables. Home safety  Remove any tripping hazards, such as rugs, cords, and clutter.  Install safety equipment such as grab bars in bathrooms and safety rails on stairs.  Keep rooms and walkways well-lit. Activity   Follow a regular exercise program to stay fit. This will help you maintain your balance. Ask your health care provider  what types of exercise are appropriate for you.  If you need a cane or walker, use it as recommended by your health care provider.  Wear supportive shoes that have nonskid soles. Lifestyle  Do not drink alcohol if your health care provider tells you not to drink.  If you drink alcohol, limit how much you have: ? 0-1 drink a day for women. ? 0-2 drinks a day for men.  Be aware of how much alcohol is in your drink. In the [place] one drink equals one typical bottle of beer (12 oz), one-half glass of wine (5 oz), or one shot  of hard liquor (1 oz).  Do not use any products that contain nicotine or tobacco, such as cigarettes and e-cigarettes. If you need help quitting, ask your health care provider. Summary  Having a healthy lifestyle and getting preventive care can help to protect your health and wellness after age 32.  Screening and testing are the best way to find a health problem early and help you avoid having a fall. Early diagnosis and treatment give you the best chance for managing medical conditions that are more common for people who are older than age 87.  Falls are a major cause of broken bones and head injuries in people who are older than age 18. Take precautions to prevent a fall at home.  Work with your health care provider to learn what changes you can make to improve your health and wellness and to prevent falls. This information is not intended to replace advice given to you by your health care provider. Make sure you discuss any questions you have with your health care provider. Document Revised: 03/14/2019 Document Reviewed: 10/04/2017 Elsevier Patient Education  2020 Elsevier Inc.      Agustina Caroli, MD [facility]

## 2020-02-05 ENCOUNTER — Ambulatory Visit: Payer: Medicare Other | Attending: Internal Medicine

## 2020-02-05 DIAGNOSIS — Z23 Encounter for immunization: Secondary | ICD-10-CM

## 2020-02-05 NOTE — Progress Notes (Signed)
   [id number]  Name:  Omar [name].    MRN: [id number] DOB: 02/03/1946  02/05/2020  [name] was observed post Covid-19 immunization for 15 minutes without incident. He was provided with Vaccine Information Sheet and instruction to access the V-Safe system.   [name] was instructed to call 911 with any severe reactions post vaccine: Marland Kitchen Difficulty breathing  . Swelling of face and throat  . A fast heartbeat  . A bad rash all over body  . Dizziness and weakness   Immunizations Administered    Name Date Dose VIS Date Route   Pfizer COVID-19 Vaccine 02/05/2020  9:12 AM 0.3 mL 11/15/2019 Intramuscular   Manufacturer: [place]   Lot: [id number]   [place]: [id number]

## 2020-02-10 ENCOUNTER — Telehealth: Payer: Self-pay | Admitting: Medical

## 2020-02-10 NOTE — Telephone Encounter (Signed)
RX REFILL lubiprostone (AMITIZA) 24 MCG capsule Pharmacy [id number] - [place], [place] Phone:  [phone]  Fax:  [phone]

## 2020-02-11 ENCOUNTER — Encounter: Payer: Self-pay | Admitting: Emergency Medicine

## 2020-02-11 ENCOUNTER — Other Ambulatory Visit: Payer: Self-pay

## 2020-02-11 ENCOUNTER — Ambulatory Visit (INDEPENDENT_AMBULATORY_CARE_PROVIDER_SITE_OTHER): Payer: Medicare Other | Admitting: Emergency Medicine

## 2020-02-11 VITALS — BP 112/72 | HR 79 | Temp 98.1°F | Resp 16 | Ht 75.0 in | Wt 194.0 lb

## 2020-02-11 DIAGNOSIS — H6121 Impacted cerumen, right ear: Secondary | ICD-10-CM | POA: Diagnosis not present

## 2020-02-11 NOTE — Telephone Encounter (Signed)
Pt requesting a refill on lubiprostone (AMITIZA) 24 MCG capsule but in his last encounter 01/21/2020 it states that he is not taking this medication.  Please Advise

## 2020-02-11 NOTE — Patient Instructions (Addendum)
   If you have lab work done today you will be contacted with your lab results within the next 2 weeks.  If you have not heard from us then please contact us. The fastest way to get your results is to register for My Chart.   IF you received an x-ray today, you will receive an invoice from [facility]. Please contact [facility] at [phone] with questions or concerns regarding your invoice.   IF you received labwork today, you will receive an invoice from [facility]. Please contact [facility] at [phone] with questions or concerns regarding your invoice.   Our billing staff will not be able to assist you with questions regarding bills from these companies.  You will be contacted with the lab results as soon as they are available. The fastest way to get your results is to activate your My Chart account. Instructions are located on the last page of this paperwork. If you have not heard from us regarding the results in 2 weeks, please contact this office.     Earwax Buildup, Adult The ears produce a substance called earwax that helps keep bacteria out of the ear and protects the skin in the ear canal. Occasionally, earwax can build up in the ear and cause discomfort or hearing loss. What increases the risk? This condition is more likely to develop in people who:  Are male.  Are elderly.  Naturally produce more earwax.  Clean their ears often with cotton swabs.  Use earplugs often.  Use in-ear headphones often.  Wear hearing aids.  Have narrow ear canals.  Have earwax that is overly thick or sticky.  Have eczema.  Are dehydrated.  Have excess hair in the ear canal. What are the signs or symptoms? Symptoms of this condition include:  Reduced or muffled hearing.  A feeling of fullness in the ear or feeling that the ear is plugged.  Fluid coming from the ear.  Ear pain.  Ear itch.  Ringing in the ear.  Coughing.  An obvious piece of earwax  that can be seen inside the ear canal. How is this diagnosed? This condition may be diagnosed based on:  Your symptoms.  Your medical history.  An ear exam. During the exam, your health care provider will look into your ear with an instrument called an otoscope. You may have tests, including a hearing test. How is this treated? This condition may be treated by:  Using ear drops to soften the earwax.  Having the earwax removed by a health care provider. The health care provider may: ? Flush the ear with water. ? Use an instrument that has a loop on the end (curette). ? Use a suction device.  Surgery to remove the wax buildup. This may be done in severe cases. Follow these instructions at home:   Take over-the-counter and prescription medicines only as told by your health care provider.  Do not put any objects, including cotton swabs, into your ear. You can clean the opening of your ear canal with a washcloth or facial tissue.  Follow instructions from your health care provider about cleaning your ears. Do not over-clean your ears.  Drink enough fluid to keep your urine clear or pale yellow. This will help to thin the earwax.  Keep all follow-up visits as told by your health care provider. If earwax builds up in your ears often or if you use hearing aids, consider seeing your health care provider for routine, preventive ear cleanings. Ask   your health care provider how often you should schedule your cleanings.  If you have hearing aids, clean them according to instructions from the manufacturer and your health care provider. Contact a health care provider if:  You have ear pain.  You develop a fever.  You have blood, pus, or other fluid coming from your ear.  You have hearing loss.  You have ringing in your ears that does not go away.  Your symptoms do not improve with treatment.  You feel like the room is spinning (vertigo). Summary  Earwax can build up in the ear  and cause discomfort or hearing loss.  The most common symptoms of this condition include reduced or muffled hearing and a feeling of fullness in the ear or feeling that the ear is plugged.  This condition may be diagnosed based on your symptoms, your medical history, and an ear exam.  This condition may be treated by using ear drops to soften the earwax or by having the earwax removed by a health care provider.  Do not put any objects, including cotton swabs, into your ear. You can clean the opening of your ear canal with a washcloth or facial tissue. This information is not intended to replace advice given to you by your health care provider. Make sure you discuss any questions you have with your health care provider. Document Revised: 11/03/2017 Document Reviewed: 02/01/2017 Elsevier Patient Education  2020 Elsevier Inc.  

## 2020-02-11 NOTE — Telephone Encounter (Signed)
This medication was never prescribed by me.  I believe it was prescribed by his GI doctor.  Has to go through him/her. Thanks.

## 2020-02-11 NOTE — Telephone Encounter (Signed)
Pt has been advised that they will have to F/U with his GI Dr. For this medication.

## 2020-02-11 NOTE — Progress Notes (Signed)
Omar [name]. 74 y.o.   Chief Complaint  Patient presents with  . Ear Problem    per pt wife states looks like something is in the ears    HISTORY OF PRESENT ILLNESS: This is a 74 y.o. male may have something in the ears as told by his wife. No other complaints or medical concerns today.  HPI   Prior to Admission medications   Medication Sig Start Date End Date Taking? Authorizing Provider  esomeprazole (NEXIUM) 40 MG capsule Take 1 capsule (40 mg total) by mouth daily. 30 min before  breakfast 07/08/19  Yes Nandigam, Venia Minks, MD  OVER THE COUNTER MEDICATION daily.   Yes [provider]  lubiprostone (AMITIZA) 24 MCG capsule Take 1 capsule (24 mcg total) by mouth 2 (two) times daily with a meal. Patient not taking: Reported on 01/21/2020 04/11/19   Mauri Pole, MD  pravastatin (PRAVACHOL) 40 MG tablet Take 1 tablet (40 mg total) by mouth daily. TAKE 1 TABLET BY MOUTH EVERY DAY IN THE EVENING 04/04/19 08/19/19  Horald Pollen, MD  vardenafil (LEVITRA) 20 MG tablet Take 20 mg by mouth as needed.    02/29/12  [provider]    Allergies  Allergen Reactions  . Sulfonamide Derivatives Other (See Comments)     blisters on skin    Patient Active Problem List   Diagnosis Date Noted  . Diverticulosis 04/04/2019  . Internal hemorrhoids 04/04/2019  . 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 S/P right colectomy 08/31/2017  . Neuropathy 11/21/2016  . 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 History of pulmonary embolism 07/18/2014  . 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 Coronary atherosclerosis of native coronary artery 03/18/2014  . Hyperlipidemia 11/13/2013  . 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 Prostate cancer ([place]) 11/09/2012  . Stricture and stenosis of esophagus 05/31/2012  . Esophageal reflux 10/09/2007  . Dyslipidemia 07/16/2007  . Depression 07/16/2007  . COLONIC POLYPS, HX OF  07/16/2007    Past Medical History:  Diagnosis Date  . Anxiety   . BPH (benign prostatic hypertrophy)   . Decreased white blood cell count   . Depression   . Diverticulosis   . Erectile dysfunction   . Gastric polyps   . GERD (gastroesophageal reflux disease)   . History of colonic polyps   . History of esophageal stricture 10/31/2007  . History of kidney stones   . History of prostate surgery   . History of rheumatic fever   . Hyperlipidemia   . Hypertension   . Prostate cancer ([place]) 11/09/2012  . Prostatism     Past Surgical History:  Procedure Laterality Date  . CARPAL TUNNEL RELEASE     Bilateral  . ESOPHAGEAL DILATION     last dilation 8'12  . INGUINAL HERNIA REPAIR    . INSERTION OF MESH N/A 04/15/2013   Procedure: INSERTION OF MESH;  Surgeon: Odis Hollingshead, MD;  Location: [facility];  Service: General;  Laterality: N/A;  . KNEE ARTHROSCOPY     left  . LAPAROSCOPIC RIGHT HEMI COLECTOMY Right 08/31/2017   Procedure: LAPAROSCOPIC ASSISTED  RIGHT HEMI COLECTOMY;  Surgeon: Johnathan Hausen, MD;  Location: [facility];  Service: General;  Laterality: Right;  . LEFT HEART CATHETERIZATION WITH CORONARY ANGIOGRAM N/A 07/18/2014   Procedure: LEFT HEART CATHETERIZATION WITH CORONARY ANGIOGRAM;  Surgeon: Burnell Blanks, MD;  Location: [facility] CATH LAB;  Service: Cardiovascular;  Laterality: N/A;  .  ROBOT ASSISTED LAPAROSCOPIC RADICAL PROSTATECTOMY  11/08/2012   Procedure: ROBOTIC ASSISTED LAPAROSCOPIC RADICAL PROSTATECTOMY LEVEL 1;  Surgeon: Molli Hazard, MD;  Location: [facility];  Service: Urology;  Laterality: N/A;     . TONSILLECTOMY    . UMBILICAL HERNIA REPAIR N/A 04/15/2013   Procedure: HERNIA REPAIR UMBILICAL ADULT;  Surgeon: Odis Hollingshead, MD;  Location: [place] [facility];  Service: General;  Laterality: N/A;    Social History   Socioeconomic History  . Marital status: Married    Spouse name: Not on file  . Number of children: 4  . Years of education: Not on file  .  Highest education level: Not on file  Occupational History  . Occupation: pastor    Employer: [employer]  Tobacco Use  . Smoking status: Former Smoker    Packs/day: 1.00    Years: 25.00    Pack years: 25.00    Types: Cigarettes    Quit date: 12/05/1993    Years since quitting: 26.2  . Smokeless tobacco: Never Used  Substance and Sexual Activity  . Alcohol use: No  . Drug use: No  . Sexual activity: Yes  Other Topics Concern  . Not on file  Social History Narrative   Lives with wife in a one story home.  Has 4 children.  Works as a Theme park manager.     Education: 10th grade.    Social Determinants of Health   Financial Resource Strain:   . Difficulty of Paying Living Expenses: Not on file  Food Insecurity:   . Worried About Charity fundraiser in the Last Year: Not on file  . Ran Out of Food in the Last Year: Not on file  Transportation Needs:   . Lack of Transportation (Medical): Not on file  . Lack of Transportation (Non-Medical): Not on file  Physical Activity:   . Days of Exercise per Week: Not on file  . Minutes of Exercise per Session: Not on file  Stress:   . Feeling of Stress : Not on file  Social Connections:   . Frequency of Communication with Friends and Family: Not on file  . Frequency of Social Gatherings with Friends and Family: Not on file  . Attends Religious Services: Not on file  . Active Member of Clubs or Organizations: Not on file  . Attends Archivist Meetings: Not on file  . Marital Status: Not on file  Intimate Partner Violence:   . Fear of Current or Ex-Partner: Not on file  . Emotionally Abused: Not on file  . Physically Abused: Not on file  . Sexually Abused: Not on file    Family History  Problem Relation Age of Onset  . Ovarian cancer Mother   . Colon cancer Mother 32  . High Cholesterol Mother   . Stroke Father   . Lung cancer Maternal Uncle   . Lung cancer Maternal Aunt   . Esophageal cancer Neg Hx   . Stomach  cancer Neg Hx   . Rectal cancer Neg Hx   . Pancreatic cancer Neg Hx   . Liver disease Neg Hx      Review of Systems  Constitutional: Negative.  Negative for chills and fever.  HENT: Negative.  Negative for congestion, ear discharge, ear pain, hearing loss, sore throat and tinnitus.   Respiratory: Negative.  Negative for cough and shortness of breath.   Cardiovascular: Negative.  Negative for chest pain and palpitations.  Gastrointestinal: Negative for nausea and vomiting.  Skin: Negative.  Negative for rash.  Neurological: Negative for dizziness and headaches.   Today's Vitals   02/11/20 1356  BP: 112/72  Pulse: 79  Resp: 16  Temp: 98.1 F (36.7 C)  TempSrc: Temporal  SpO2: 99%  Weight: 194 lb (88 kg)  Height: 6\' 3"  ([id number] m)   Body mass index is 24.25 kg/m.   Physical Exam Vitals reviewed.  Constitutional:      Appearance: Normal appearance.  HENT:     Head: Normocephalic.     Right Ear: Tympanic membrane, ear canal and external ear normal.     Left Ear: Tympanic membrane, ear canal and external ear normal.     Ears:     Comments: Small amount of cerumen in the right ear.  Able to fully see tympanic membrane. Eyes:     Extraocular Movements: Extraocular movements intact.  Cardiovascular:     Rate and Rhythm: Normal rate.  Pulmonary:     Effort: Pulmonary effort is normal.  Musculoskeletal:        General: Normal range of motion.  Neurological:     General: No focal deficit present.     Mental Status: He is alert and oriented to person, place, and time.  Psychiatric:        Mood and Affect: Mood normal.        Behavior: Behavior normal.      ASSESSMENT & PLAN: Makiyah was seen today for ear problem.  Diagnoses and all orders for this visit:  Cerumen debris on tympanic membrane of right ear    Patient Instructions       If you have lab work done today you will be contacted with your lab results within the next 2 weeks.  If you have not heard  from [place] then please contact us. The fastest way to get your results is to register for My Chart.   IF you received an x-ray today, you will receive an invoice from [facility]. Please contact [facility] at [phone] with questions or concerns regarding your invoice.   IF you received labwork today, you will receive an invoice from [facility]. Please contact [facility] at [phone] with questions or concerns regarding your invoice.   Our billing staff will not be able to assist you with questions regarding bills from these companies.  You will be contacted with the lab results as soon as they are available. The fastest way to get your results is to activate your My Chart account. Instructions are located on the last page of this paperwork. If you have not heard from [place] regarding the results in 2 weeks, please contact this office.     Earwax Buildup, Adult The ears produce a substance called earwax that helps keep bacteria out of the ear and protects the skin in the ear canal. Occasionally, earwax can build up in the ear and cause discomfort or hearing loss. What increases the risk? This condition is more likely to develop in people who:  Are male.  Are elderly.  Naturally produce more earwax.  Clean their ears often with cotton swabs.  Use earplugs often.  Use in-ear headphones often.  Wear hearing aids.  Have narrow ear canals.  Have earwax that is overly thick or sticky.  Have eczema.  Are dehydrated.  Have excess hair in the ear canal. What are the signs or symptoms? Symptoms of this condition include:  Reduced or muffled hearing.  A feeling of fullness in the ear or feeling that  the ear is plugged.  Fluid coming from the ear.  Ear pain.  Ear itch.  Ringing in the ear.  Coughing.  An obvious piece of earwax that can be seen inside the ear canal. How is this diagnosed? This condition may be diagnosed based on:  Your symptoms.  Your  medical history.  An ear exam. During the exam, your health care provider will look into your ear with an instrument called an otoscope. You may have tests, including a hearing test. How is this treated? This condition may be treated by:  Using ear drops to soften the earwax.  Having the earwax removed by a health care provider. The health care provider may: ? Flush the ear with water. ? Use an instrument that has a loop on the end (curette). ? Use a suction device.  Surgery to remove the wax buildup. This may be done in severe cases. Follow these instructions at home:   Take over-the-counter and prescription medicines only as told by your health care provider.  Do not put any objects, including cotton swabs, into your ear. You can clean the opening of your ear canal with a washcloth or facial tissue.  Follow instructions from your health care provider about cleaning your ears. Do not over-clean your ears.  Drink enough fluid to keep your urine clear or pale yellow. This will help to thin the earwax.  Keep all follow-up visits as told by your health care provider. If earwax builds up in your ears often or if you use hearing aids, consider seeing your health care provider for routine, preventive ear cleanings. Ask your health care provider how often you should schedule your cleanings.  If you have hearing aids, clean them according to instructions from the manufacturer and your health care provider. Contact a health care provider if:  You have ear pain.  You develop a fever.  You have blood, pus, or other fluid coming from your ear.  You have hearing loss.  You have ringing in your ears that does not go away.  Your symptoms do not improve with treatment.  You feel like the room is spinning (vertigo). Summary  Earwax can build up in the ear and cause discomfort or hearing loss.  The most common symptoms of this condition include reduced or muffled hearing and a feeling  of fullness in the ear or feeling that the ear is plugged.  This condition may be diagnosed based on your symptoms, your medical history, and an ear exam.  This condition may be treated by using ear drops to soften the earwax or by having the earwax removed by a health care provider.  Do not put any objects, including cotton swabs, into your ear. You can clean the opening of your ear canal with a washcloth or facial tissue. This information is not intended to replace advice given to you by your health care provider. Make sure you discuss any questions you have with your health care provider. Document Revised: 11/03/2017 Document Reviewed: 02/01/2017 Elsevier Patient Education  2020 Elsevier Inc.      Agustina Caroli, MD [facility]

## 2020-05-18 ENCOUNTER — Ambulatory Visit: Payer: Self-pay

## 2020-05-26 ENCOUNTER — Other Ambulatory Visit: Payer: Self-pay

## 2020-05-26 ENCOUNTER — Encounter: Payer: Self-pay | Admitting: Emergency Medicine

## 2020-05-26 ENCOUNTER — Ambulatory Visit (INDEPENDENT_AMBULATORY_CARE_PROVIDER_SITE_OTHER): Payer: Medicare Other | Admitting: Emergency Medicine

## 2020-05-26 VITALS — BP 127/80 | HR 73 | Temp 98.2°F | Resp 15 | Ht 75.0 in | Wt 197.8 lb

## 2020-05-26 DIAGNOSIS — E785 Hyperlipidemia, unspecified: Secondary | ICD-10-CM

## 2020-05-26 DIAGNOSIS — R4189 Other symptoms and signs involving cognitive functions and awareness: Secondary | ICD-10-CM | POA: Diagnosis not present

## 2020-05-26 DIAGNOSIS — R413 Other amnesia: Secondary | ICD-10-CM | POA: Diagnosis not present

## 2020-05-26 DIAGNOSIS — I10 Essential (primary) hypertension: Secondary | ICD-10-CM | POA: Diagnosis not present

## 2020-05-26 NOTE — Progress Notes (Signed)
Completing Mini-Cog on paper will be transferred to chart

## 2020-05-26 NOTE — Patient Instructions (Addendum)
   If you have lab work done today you will be contacted with your lab results within the next 2 weeks.  If you have not heard from us then please contact us. The fastest way to get your results is to register for My Chart.   IF you received an x-ray today, you will receive an invoice from [facility]. Please contact [facility] at [phone] with questions or concerns regarding your invoice.   IF you received labwork today, you will receive an invoice from [facility]. Please contact [facility] at [phone] with questions or concerns regarding your invoice.   Our billing staff will not be able to assist you with questions regarding bills from these companies.  You will be contacted with the lab results as soon as they are available. The fastest way to get your results is to activate your My Chart account. Instructions are located on the last page of this paperwork. If you have not heard from us regarding the results in 2 weeks, please contact this office.     Health Maintenance After Age 65 After age 65, you are at a higher risk for certain [place]-term diseases and infections as well as injuries from falls. Falls are a major cause of broken bones and head injuries in people who are older than age 65. Getting regular preventive care can help to keep you healthy and well. Preventive care includes getting regular testing and making lifestyle changes as recommended by your health care provider. Talk with your health care provider about:  Which screenings and tests you should have. A screening is a test that checks for a disease when you have no symptoms.  A diet and exercise plan that is right for you. What should I know about screenings and tests to prevent falls? Screening and testing are the best ways to find a health problem early. Early diagnosis and treatment give you the best chance of managing medical conditions that are common after age 65. Certain conditions and  lifestyle choices may make you more likely to have a fall. Your health care provider may recommend:  Regular vision checks. Poor vision and conditions such as cataracts can make you more likely to have a fall. If you wear glasses, make sure to get your prescription updated if your vision changes.  Medicine review. Work with your health care provider to regularly review all of the medicines you are taking, including over-the-counter medicines. Ask your health care provider about any side effects that may make you more likely to have a fall. Tell your health care provider if any medicines that you take make you feel dizzy or sleepy.  Osteoporosis screening. Osteoporosis is a condition that causes the bones to get weaker. This can make the bones weak and cause them to break more easily.  Blood pressure screening. Blood pressure changes and medicines to control blood pressure can make you feel dizzy.  Strength and balance checks. Your health care provider may recommend certain tests to check your strength and balance while standing, walking, or changing positions.  Foot health exam. Foot pain and numbness, as well as not wearing proper footwear, can make you more likely to have a fall.  Depression screening. You may be more likely to have a fall if you have a fear of falling, feel emotionally low, or feel unable to do activities that you used to do.  Alcohol use screening. Using too much alcohol can affect your balance and may make you more likely to   have a fall. What actions can I take to lower my risk of falls? General instructions  Talk with your health care provider about your risks for falling. Tell your health care provider if: ? You fall. Be sure to tell your health care provider about all falls, even ones that seem minor. ? You feel dizzy, sleepy, or off-balance.  Take over-the-counter and prescription medicines only as told by your health care provider. These include any  supplements.  Eat a healthy diet and maintain a healthy weight. A healthy diet includes low-fat dairy products, low-fat (lean) meats, and fiber from whole grains, beans, and lots of fruits and vegetables. Home safety  Remove any tripping hazards, such as rugs, cords, and clutter.  Install safety equipment such as grab bars in bathrooms and safety rails on stairs.  Keep rooms and walkways well-lit. Activity   Follow a regular exercise program to stay fit. This will help you maintain your balance. Ask your health care provider what types of exercise are appropriate for you.  If you need a cane or walker, use it as recommended by your health care provider.  Wear supportive shoes that have nonskid soles. Lifestyle  Do not drink alcohol if your health care provider tells you not to drink.  If you drink alcohol, limit how much you have: ? 0-1 drink a day for women. ? 0-2 drinks a day for men.  Be aware of how much alcohol is in your drink. In the [place] one drink equals one typical bottle of beer (12 oz), one-half glass of wine (5 oz), or one shot of hard liquor (1 oz).  Do not use any products that contain nicotine or tobacco, such as cigarettes and e-cigarettes. If you need help quitting, ask your health care provider. Summary  Having a healthy lifestyle and getting preventive care can help to protect your health and wellness after age 65.  Screening and testing are the best way to find a health problem early and help you avoid having a fall. Early diagnosis and treatment give you the best chance for managing medical conditions that are more common for people who are older than age 65.  Falls are a major cause of broken bones and head injuries in people who are older than age 65. Take precautions to prevent a fall at home.  Work with your health care provider to learn what changes you can make to improve your health and wellness and to prevent falls. This information is not intended  to replace advice given to you by your health care provider. Make sure you discuss any questions you have with your health care provider. Document Revised: 03/14/2019 Document Reviewed: 10/04/2017 Elsevier Patient Education  2020 Elsevier Inc.  

## 2020-05-26 NOTE — Progress Notes (Signed)
Omar [name]. 74 y.o.   Chief Complaint  Patient presents with  . Memory Loss    pt noticed he is having a hard time focusing and has started to become more forgetful over the past few weeks, wife notes he will ask the same question multiple times.     HISTORY OF PRESENT ILLNESS: This is a 74 y.o. male complaining of memory problems for the past 3 to 4 months.  Wife states he has become very repetitive and forgetful.  Patient denies headaches or visual problems.  No seizure activity.  No chest pain or difficulty breathing.  Problems with balance when getting up otherwise okay.  Denies fever chills nausea vomiting.  No new medications.  Fully vaccinated against Covid. No other significant symptoms.   HPI   Prior to Admission medications   Medication Sig Start Date End Date Taking? Authorizing Provider  esomeprazole (NEXIUM) 40 MG capsule Take 1 capsule (40 mg total) by mouth daily. 30 min before  breakfast 07/08/19  Yes Nandigam, Venia Minks, MD  lubiprostone (AMITIZA) 24 MCG capsule Take 1 capsule (24 mcg total) by mouth 2 (two) times daily with a meal. 04/11/19  Yes Nandigam, Venia Minks, MD  OVER THE COUNTER MEDICATION daily.   Yes [provider]  pravastatin (PRAVACHOL) 40 MG tablet Take 1 tablet (40 mg total) by mouth daily. TAKE 1 TABLET BY MOUTH EVERY DAY IN THE EVENING 04/04/19 08/19/19  Horald Pollen, MD  vardenafil (LEVITRA) 20 MG tablet Take 20 mg by mouth as needed.    02/29/12  [provider]    Allergies  Allergen Reactions  . Sulfonamide Derivatives Other (See Comments)     blisters on skin    Patient Active Problem List   Diagnosis Date Noted  . Diverticulosis 04/04/2019  . Internal hemorrhoids 04/04/2019  . 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 S/P right colectomy 08/31/2017  . Neuropathy 11/21/2016  . 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 History of pulmonary embolism  07/18/2014  . 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 Coronary atherosclerosis of native coronary artery 03/18/2014  . Hyperlipidemia 11/13/2013  . 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 Prostate cancer ([place]) 11/09/2012  . Stricture and stenosis of esophagus 05/31/2012  . Esophageal reflux 10/09/2007  . Dyslipidemia 07/16/2007  . Depression 07/16/2007  . COLONIC POLYPS, HX OF 07/16/2007    Past Medical History:  Diagnosis Date  . Anxiety   . BPH (benign prostatic hypertrophy)   . Decreased white blood cell count   . Depression   . Diverticulosis   . Erectile dysfunction   . Gastric polyps   . GERD (gastroesophageal reflux disease)   . History of colonic polyps   . History of esophageal stricture 10/31/2007  . History of kidney stones   . History of prostate surgery   . History of rheumatic fever   . Hyperlipidemia   . Hypertension   . Prostate cancer ([place]) 11/09/2012  . Prostatism     Past Surgical History:  Procedure Laterality Date  . CARPAL TUNNEL RELEASE     Bilateral  . ESOPHAGEAL DILATION     last dilation 8'12  . INGUINAL HERNIA REPAIR    . INSERTION OF MESH N/A 04/15/2013   Procedure: INSERTION OF MESH;  Surgeon: Odis Hollingshead, MD;  Location: [place];  Service: General;  Laterality: N/A;  . KNEE ARTHROSCOPY     left  . LAPAROSCOPIC RIGHT HEMI  COLECTOMY Right 08/31/2017   Procedure: LAPAROSCOPIC ASSISTED  RIGHT HEMI COLECTOMY;  Surgeon: Johnathan Hausen, MD;  Location: [facility];  Service: General;  Laterality: Right;  . LEFT HEART CATHETERIZATION WITH CORONARY ANGIOGRAM N/A 07/18/2014   Procedure: LEFT HEART CATHETERIZATION WITH CORONARY ANGIOGRAM;  Surgeon: Burnell Blanks, MD;  [facility];  Service: Cardiovascular;  Laterality: N/A;  . ROBOT ASSISTED LAPAROSCOPIC RADICAL PROSTATECTOMY  11/08/2012   Procedure: ROBOTIC ASSISTED LAPAROSCOPIC RADICAL PROSTATECTOMY LEVEL 1;  Surgeon: Molli Hazard, MD;  Location: [facility];  Service: Urology;   Laterality: N/A;     . TONSILLECTOMY    . UMBILICAL HERNIA REPAIR N/A 04/15/2013   Procedure: HERNIA REPAIR UMBILICAL ADULT;  Surgeon: Odis Hollingshead, MD;  Location: [place];  Service: General;  Laterality: N/A;    Social History   Socioeconomic History  . Marital status: Married    Spouse name: Not on file  . Number of children: 4  . Years of education: Not on file  . Highest education level: Not on file  Occupational History  . Occupation: pastor    Employer: [employer]  Tobacco Use  . Smoking status: Former Smoker    Packs/day: 1.00    Years: 25.00    Pack years: 25.00    Types: Cigarettes    Quit date: 12/05/1993    Years since quitting: 26.4  . Smokeless tobacco: Never Used  Vaping Use  . Vaping Use: Never used  Substance and Sexual Activity  . Alcohol use: No  . Drug use: No  . Sexual activity: Yes  Other Topics Concern  . Not on file  Social History Narrative   Lives with wife in a one story home.  Has 4 children.  Works as a Theme park manager.     Education: 10th grade.    Social Determinants of Health   Financial Resource Strain:   . Difficulty of Paying Living Expenses:   Food Insecurity:   . Worried About Charity fundraiser in the Last Year:   . Arboriculturist in the Last Year:   Transportation Needs:   . Film/video editor (Medical):   Marland Kitchen Lack of Transportation (Non-Medical):   Physical Activity:   . Days of Exercise per Week:   . Minutes of Exercise per Session:   Stress:   . Feeling of Stress :   Social Connections:   . Frequency of Communication with Friends and Family:   . Frequency of Social Gatherings with Friends and Family:   . Attends Religious Services:   . Active Member of Clubs or Organizations:   . Attends Archivist Meetings:   Marland Kitchen Marital Status:   Intimate Partner Violence:   . Fear of Current or Ex-Partner:   . Emotionally Abused:   Marland Kitchen Physically Abused:   . Sexually Abused:     Family History  Problem  Relation Age of Onset  . Ovarian cancer Mother   . Colon cancer Mother 57  . High Cholesterol Mother   . Stroke Father   . Lung cancer Maternal Uncle   . Lung cancer Maternal Aunt   . Esophageal cancer Neg Hx   . Stomach cancer Neg Hx   . Rectal cancer Neg Hx   . Pancreatic cancer Neg Hx   . Liver disease Neg Hx      Review of Systems  Constitutional: Negative.  Negative for chills, fever and malaise/fatigue.  HENT: Negative.  Negative for congestion, hearing  loss and sore throat.   Respiratory: Negative.  Negative for cough and shortness of breath.   Cardiovascular: Negative.  Negative for chest pain and palpitations.  Gastrointestinal: Negative.  Negative for abdominal pain, diarrhea, nausea and vomiting.  Genitourinary: Negative.  Negative for dysuria and hematuria.  Musculoskeletal: Negative.  Negative for myalgias and neck pain.  Skin: Negative.  Negative for rash.  Neurological: Negative.  Negative for dizziness, sensory change, speech change, focal weakness, seizures, loss of consciousness and headaches.  Psychiatric/Behavioral: Positive for memory loss. Negative for depression and suicidal ideas.  All other systems reviewed and are negative.  Today's Vitals   05/26/20 1357  BP: 127/80  Pulse: 73  Resp: 15  Temp: 98.2 F (36.8 C)  TempSrc: Temporal  SpO2: 98%  Weight: 197 lb 12.8 oz (89.7 kg)  Height: 6\' 3"  (1.905 m)   Body mass index is 24.72 kg/m.   Physical Exam Vitals reviewed.  Constitutional:      Appearance: Normal appearance.  HENT:     Head: Normocephalic and atraumatic.     Right Ear: Tympanic membrane, ear canal and external ear normal.     Left Ear: Tympanic membrane, ear canal and external ear normal.     Mouth/Throat:     Mouth: Mucous membranes are moist.     Pharynx: Oropharynx is clear.  Eyes:     Extraocular Movements: Extraocular movements intact.     Conjunctiva/sclera: Conjunctivae normal.     Pupils: Pupils are equal, round, and  reactive to light.  Neck:     Vascular: No carotid bruit.  Cardiovascular:     Rate and Rhythm: Normal rate and regular rhythm.     Pulses: Normal pulses.     Heart sounds: Normal heart sounds.  Pulmonary:     Effort: Pulmonary effort is normal.     Breath sounds: Normal breath sounds.  Abdominal:     General: There is no distension.     Palpations: Abdomen is soft.     Tenderness: There is no abdominal tenderness.  Musculoskeletal:        General: Normal range of motion.     Cervical back: Normal range of motion and neck supple. No tenderness.  Skin:    General: Skin is warm and dry.     Capillary Refill: Capillary refill takes less than 2 seconds.  Neurological:     General: No focal deficit present.     Mental Status: He is alert and oriented to person, place, and time.     Cranial Nerves: No cranial nerve deficit.     Sensory: No sensory deficit.     Motor: No weakness.     Coordination: Coordination normal.     Gait: Gait normal.     Deep Tendon Reflexes: Reflexes normal.  Psychiatric:        Mood and Affect: Mood normal.        Behavior: Behavior normal.    MOCA score:13/30 A total of 30 minutes was spent with the patient, greater than 50% of which was in counseling/coordination of care regarding differential diagnosis, need for diagnostic work-up including neurologic evaluation, review of most recent office visit notes, review of most recent blood work results, prognosis and need for follow-up.  ASSESSMENT & PLAN: Omar [name] was seen today for memory loss.  Diagnoses and all orders for this visit:  Memory deficit -     Comprehensive metabolic panel -     TSH -     CBC with  Differential/Platelet -     Ambulatory referral to Neurology  Cognitive decline -     Comprehensive metabolic panel -     TSH -     CBC with Differential/Platelet -     Ambulatory referral to Neurology  Essential hypertension  Dyslipidemia    Patient Instructions       If you  have lab work done today you will be contacted with your lab results within the next 2 weeks.  If you have not heard from [place] then please contact us. The fastest way to get your results is to register for My Chart.   IF you received an x-ray today, you will receive an invoice from [facility]. Please contact [facility] at [phone] with questions or concerns regarding your invoice.   IF you received labwork today, you will receive an invoice from [facility]. Please contact [facility] at [phone] with questions or concerns regarding your invoice.   Our billing staff will not be able to assist you with questions regarding bills from these companies.  You will be contacted with the lab results as soon as they are available. The fastest way to get your results is to activate your My Chart account. Instructions are located on the last page of this paperwork. If you have not heard from [place] regarding the results in 2 weeks, please contact this office.     Health Maintenance After Age 4 After age 21, you are at a higher risk for certain [place]-term diseases and infections as well as injuries from falls. Falls are a major cause of broken bones and head injuries in people who are older than age 18. Getting regular preventive care can help to keep you healthy and well. Preventive care includes getting regular testing and making lifestyle changes as recommended by your health care provider. Talk with your health care provider about:  Which screenings and tests you should have. A screening is a test that checks for a disease when you have no symptoms.  A diet and exercise plan that is right for you. What should I know about screenings and tests to prevent falls? Screening and testing are the best ways to find a health problem early. Early diagnosis and treatment give you the best chance of managing medical conditions that are common after age 57. Certain conditions and lifestyle choices may  make you more likely to have a fall. Your health care provider may recommend:  Regular vision checks. Poor vision and conditions such as cataracts can make you more likely to have a fall. If you wear glasses, make sure to get your prescription updated if your vision changes.  Medicine review. Work with your health care provider to regularly review all of the medicines you are taking, including over-the-counter medicines. Ask your health care provider about any side effects that may make you more likely to have a fall. Tell your health care provider if any medicines that you take make you feel dizzy or sleepy.  Osteoporosis screening. Osteoporosis is a condition that causes the bones to get weaker. This can make the bones weak and cause them to break more easily.  Blood pressure screening. Blood pressure changes and medicines to control blood pressure can make you feel dizzy.  Strength and balance checks. Your health care provider may recommend certain tests to check your strength and balance while standing, walking, or changing positions.  Foot health exam. Foot pain and numbness, as well as not wearing proper footwear, can make you  more likely to have a fall.  Depression screening. You may be more likely to have a fall if you have a fear of falling, feel emotionally low, or feel unable to do activities that you used to do.  Alcohol use screening. Using too much alcohol can affect your balance and may make you more likely to have a fall. What actions can I take to lower my risk of falls? General instructions  Talk with your health care provider about your risks for falling. Tell your health care provider if: ? You fall. Be sure to tell your health care provider about all falls, even ones that seem minor. ? You feel dizzy, sleepy, or off-balance.  Take over-the-counter and prescription medicines only as told by your health care provider. These include any supplements.  Eat a healthy diet and  maintain a healthy weight. A healthy diet includes low-fat dairy products, low-fat (lean) meats, and fiber from whole grains, beans, and lots of fruits and vegetables. Home safety  Remove any tripping hazards, such as rugs, cords, and clutter.  Install safety equipment such as grab bars in bathrooms and safety rails on stairs.  Keep rooms and walkways well-lit. Activity   Follow a regular exercise program to stay fit. This will help you maintain your balance. Ask your health care provider what types of exercise are appropriate for you.  If you need a cane or walker, use it as recommended by your health care provider.  Wear supportive shoes that have nonskid soles. Lifestyle  Do not drink alcohol if your health care provider tells you not to drink.  If you drink alcohol, limit how much you have: ? 0-1 drink a day for women. ? 0-2 drinks a day for men.  Be aware of how much alcohol is in your drink. In the [place] one drink equals one typical bottle of beer (12 oz), one-half glass of wine (5 oz), or one shot of hard liquor (1 oz).  Do not use any products that contain nicotine or tobacco, such as cigarettes and e-cigarettes. If you need help quitting, ask your health care provider. Summary  Having a healthy lifestyle and getting preventive care can help to protect your health and wellness after age 34.  Screening and testing are the best way to find a health problem early and help you avoid having a fall. Early diagnosis and treatment give you the best chance for managing medical conditions that are more common for people who are older than age 36.  Falls are a major cause of broken bones and head injuries in people who are older than age 10. Take precautions to prevent a fall at home.  Work with your health care provider to learn what changes you can make to improve your health and wellness and to prevent falls. This information is not intended to replace advice given to you by your  health care provider. Make sure you discuss any questions you have with your health care provider. Document Revised: 03/14/2019 Document Reviewed: 10/04/2017 Elsevier Patient Education  2020 Elsevier Inc.      Agustina Caroli, MD [facility]

## 2020-05-27 LAB — COMPREHENSIVE METABOLIC PANEL
ALT: 14 IU/L (ref 0–44)
AST: 18 IU/L (ref 0–40)
Albumin/Globulin Ratio: 2.2 (ref 1.2–2.2)
Albumin: 4.3 g/dL (ref 3.7–4.7)
Alkaline Phosphatase: 74 IU/L (ref 48–121)
BUN/Creatinine Ratio: 16 (ref 10–24)
BUN: 16 mg/dL (ref 8–27)
Bilirubin Total: 0.5 mg/dL (ref 0.0–1.2)
CO2: 26 mmol/L (ref 20–29)
Calcium: 8.9 mg/dL (ref 8.6–10.2)
Chloride: 103 mmol/L (ref 96–106)
Creatinine, Ser: 1 mg/dL (ref 0.76–1.27)
GFR calc Af Amer: 85 mL/min/{1.73_m2} (ref >59)
GFR calc non Af Amer: 74 mL/min/{1.73_m2} (ref >59)
Globulin, Total: 2 g/dL (ref 1.5–4.5)
Glucose: 82 mg/dL (ref 65–99)
Potassium: 4.4 mmol/L (ref 3.5–5.2)
Sodium: 141 mmol/L (ref 134–144)
Total Protein: 6.3 g/dL (ref 6.0–8.5)

## 2020-05-27 LAB — CBC WITH DIFFERENTIAL/PLATELET
Basophils Absolute: 0.1 10*3/uL (ref 0.0–0.2)
Basos: 2 %
EOS (ABSOLUTE): 0.2 10*3/uL (ref 0.0–0.4)
Eos: 8 %
Hematocrit: 42.7 % (ref 37.5–51.0)
Hemoglobin: 14 g/dL (ref 13.0–17.7)
Immature Grans (Abs): 0 10*3/uL (ref 0.0–0.1)
Immature Granulocytes: 0 %
Lymphocytes Absolute: 1.1 10*3/uL (ref 0.7–3.1)
Lymphs: 39 %
MCH: 27.1 pg (ref 26.6–33.0)
MCHC: 32.8 g/dL (ref 31.5–35.7)
MCV: 83 fL (ref 79–97)
Monocytes Absolute: 0.3 10*3/uL (ref 0.1–0.9)
Monocytes: 11 %
Neutrophils Absolute: 1.2 10*3/uL — ABNORMAL LOW (ref 1.4–7.0)
Neutrophils: 40 %
Platelets: 173 10*3/uL (ref 150–450)
RBC: 5.17 x10E6/uL (ref 4.14–5.80)
RDW: 13 % (ref 11.6–15.4)
WBC: 3 10*3/uL — ABNORMAL LOW (ref 3.4–10.8)

## 2020-05-27 LAB — TSH: TSH: 1.27 u[IU]/mL (ref 0.450–4.500)

## 2020-05-31 IMAGING — DX DG CHEST 2V
2 series · 2 of 2 positions shown · non-contrast
Comparison: 08/29/2018 and prior chest radiographs

CLINICAL DATA: Acute shortness of breath.

EXAM:
CHEST - 2 VIEW

[chest pa]
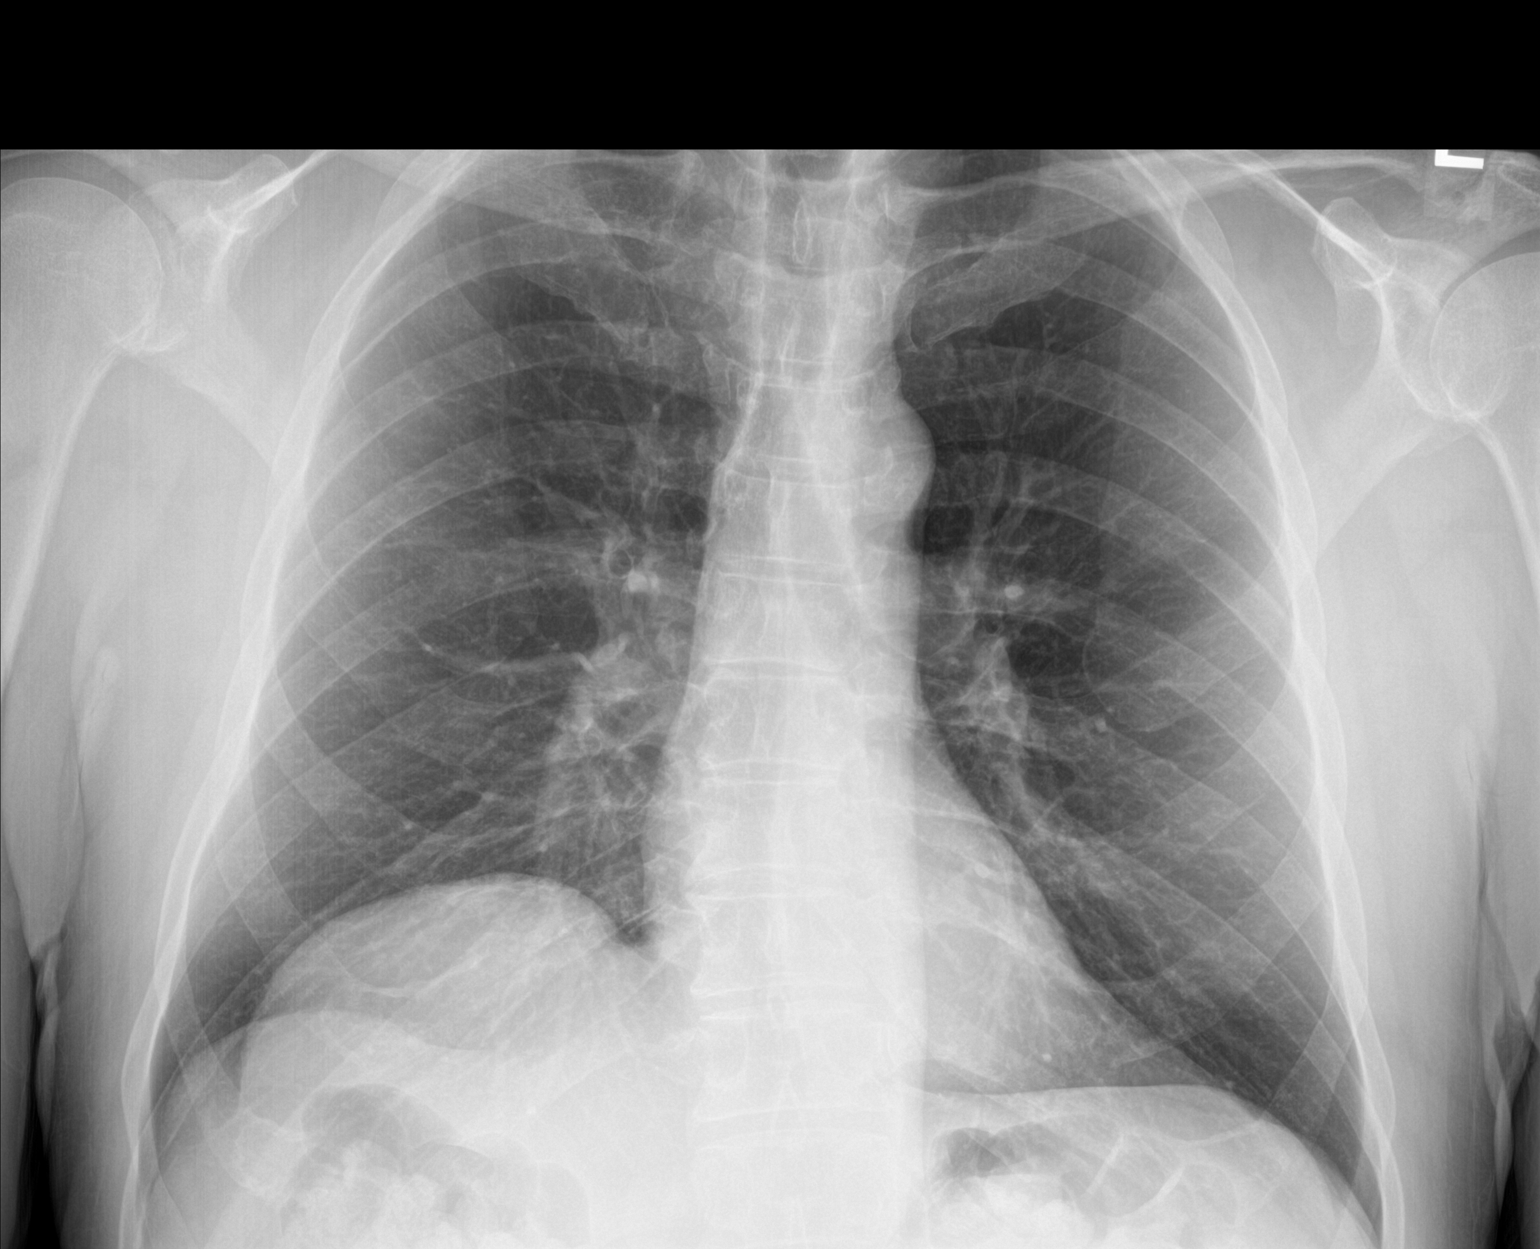

[chest lat]
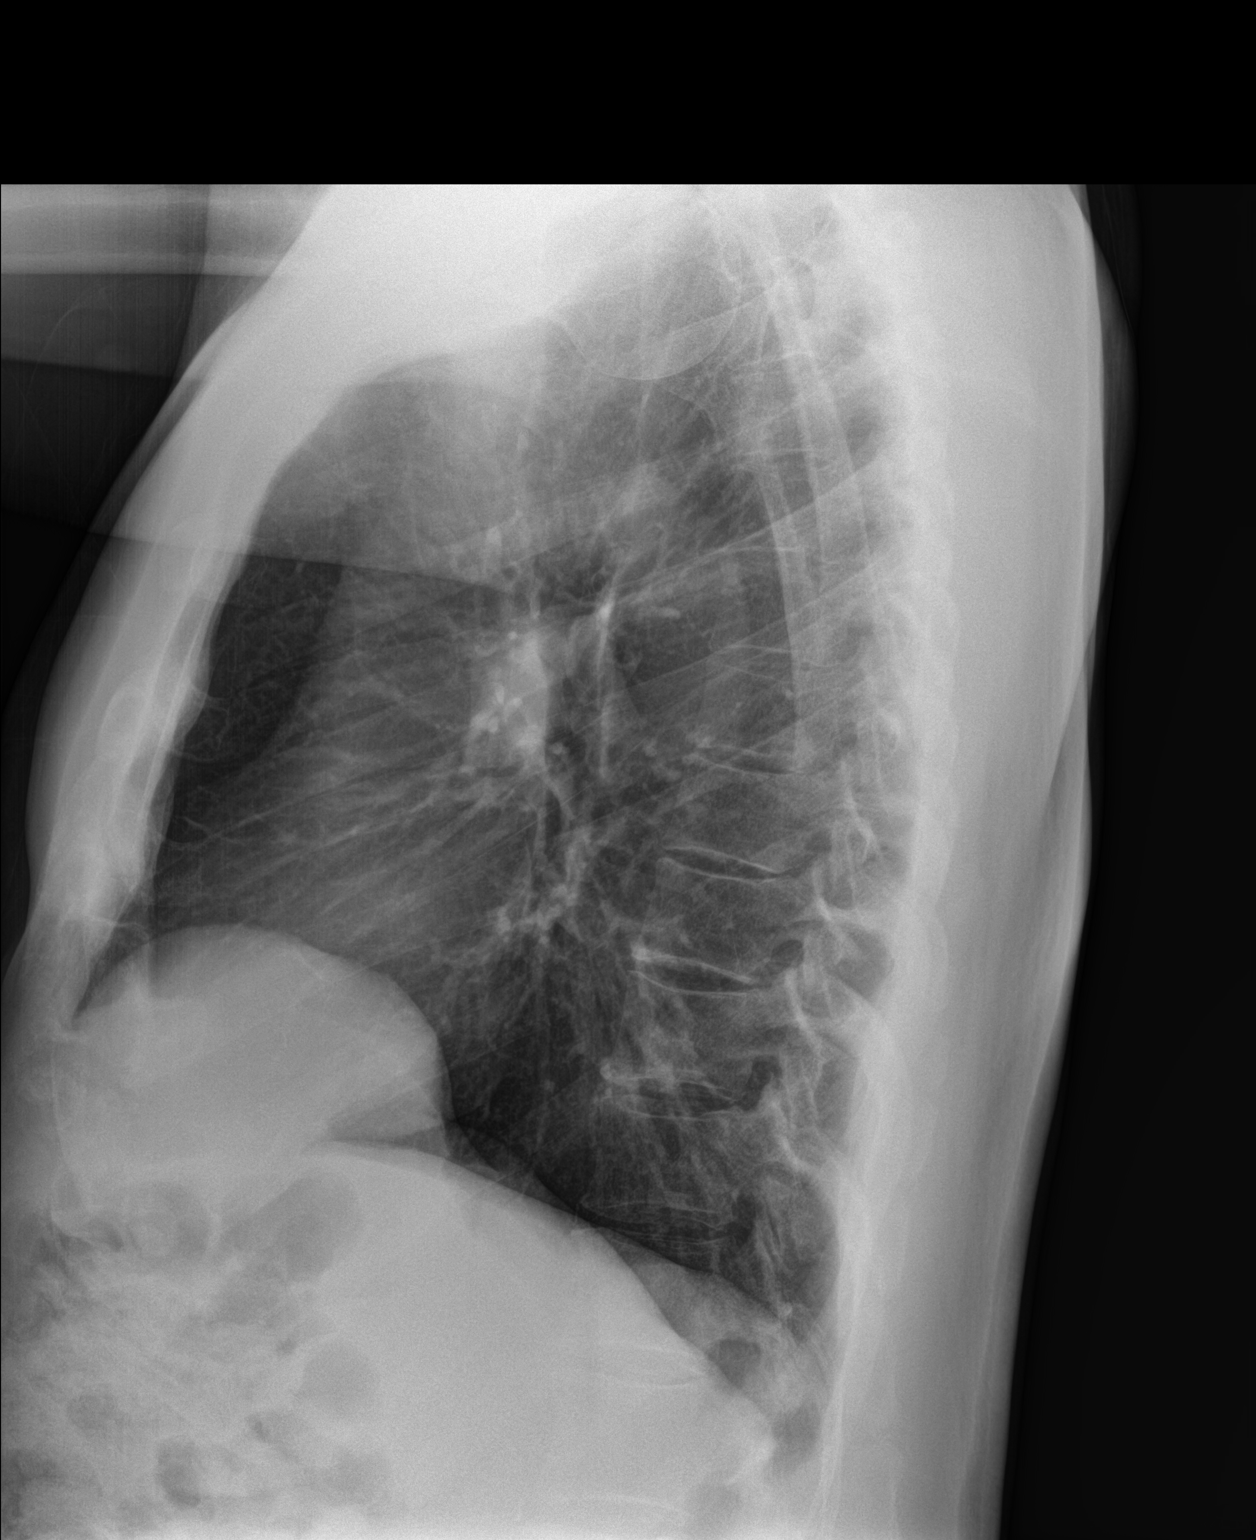

[2 of 2 positions shown; findings below may reference images not displayed]

FINDINGS: The cardiomediastinal silhouette is unremarkable.

RIGHT hemidiaphragm elevation again noted.

There is no evidence of focal airspace disease, pulmonary edema,
suspicious pulmonary nodule/mass, pleural effusion, or pneumothorax.

No acute bony abnormalities are identified.
IMPRESSION: No evidence of acute cardiopulmonary disease.

## 2020-06-04 ENCOUNTER — Other Ambulatory Visit: Payer: Self-pay | Admitting: Emergency Medicine

## 2020-06-04 DIAGNOSIS — E785 Hyperlipidemia, unspecified: Secondary | ICD-10-CM

## 2020-06-04 NOTE — Telephone Encounter (Signed)
Requested medication (s) are due for refill today:   Yes  Requested medication (s) are on the active medication list:   Yes  Future visit scheduled:   No   Last ordered: 04/04/2019 #90 with 3 refills  Returned because protocol failed due to lab work being needed.  :Provider to review for refill.   Requested Prescriptions  Pending Prescriptions Disp Refills   pravastatin (PRAVACHOL) 40 MG tablet [Pharmacy Med Name: PRAVASTATIN SODIUM 40 MG TAB] 90 tablet 3    Sig: TAKE 1 TABLET BY MOUTH IN THE EVENING      Cardiovascular:  Antilipid - Statins Failed - 06/04/2020  2:36 PM      Failed - Total Cholesterol in normal range and within 360 days    Cholesterol  Date Value Ref Range Status  09/13/2017 172 0 - 200 mg/dL Final    Comment:    ATP III Classification       Desirable:  < 200 mg/dL               Borderline High:  200 - 239 mg/dL          High:  > = 240 mg/dL          Failed - LDL in normal range and within 360 days    LDL Cholesterol  Date Value Ref Range Status  09/13/2017 105 (H) 0 - 99 mg/dL Final   Direct LDL  Date Value Ref Range Status  08/16/2016 114.0 mg/dL Final    Comment:    Optimal:  <100 mg/dLNear or Above Optimal:  100-129 mg/dLBorderline High:  130-159 mg/dLHigh:  160-189 mg/dLVery High:  >190 mg/dL          Failed - HDL in normal range and within 360 days    HDL  Date Value Ref Range Status  09/13/2017 46.40 >39.00 mg/dL Final          Failed - Triglycerides in normal range and within 360 days    Triglycerides  Date Value Ref Range Status  09/13/2017 104.0 0 - 149 mg/dL Final    Comment:    Normal:  <150 mg/dLBorderline High:  150 - 199 mg/dL          Passed - Patient is not pregnant      Passed - Valid encounter within last 12 months    Recent Outpatient Visits           1 week ago Memory deficit   [facility] [place], Ines Bloomer, MD   3 months ago Cerumen debris on tympanic membrane of right ear   [facility] [place], [place], MD   4 months ago Gastroesophageal reflux disease without esophagitis   [facility] [place], [place], MD   10 months ago Gastroesophageal reflux disease without esophagitis   [facility], Ines Bloomer, MD   10 months ago Essential hypertension   [facility], Ines Bloomer, MD

## 2020-07-10 ENCOUNTER — Telehealth: Payer: Self-pay | Admitting: Emergency Medicine

## 2020-07-10 NOTE — Telephone Encounter (Signed)
Pt is wanting a referral for Gastro for constipation.    Please advise

## 2020-07-10 NOTE — Telephone Encounter (Signed)
Pt is requesting referral to GI for constipation, should I have him schedule with you for this issue first?

## 2020-07-11 NOTE — Telephone Encounter (Signed)
Go ahead and place a GI referral, he will also need a colonoscopy anyways. Thanks.

## 2020-07-14 ENCOUNTER — Other Ambulatory Visit: Payer: Self-pay

## 2020-07-14 DIAGNOSIS — Z1211 Encounter for screening for malignant neoplasm of colon: Secondary | ICD-10-CM

## 2020-07-14 NOTE — Telephone Encounter (Signed)
Order placed pt should be contacted soon (about 1 to 2 weeks)

## 2020-07-30 ENCOUNTER — Ambulatory Visit: Payer: Medicare Other | Admitting: Neurology

## 2020-08-07 ENCOUNTER — Ambulatory Visit: Payer: Medicare Other | Admitting: Neurology

## 2020-08-11 DIAGNOSIS — Z23 Encounter for immunization: Secondary | ICD-10-CM | POA: Diagnosis not present

## 2020-08-13 ENCOUNTER — Other Ambulatory Visit: Payer: Self-pay | Admitting: Emergency Medicine

## 2020-08-13 DIAGNOSIS — I10 Essential (primary) hypertension: Secondary | ICD-10-CM

## 2020-09-02 ENCOUNTER — Other Ambulatory Visit: Payer: Self-pay | Admitting: Family Medicine

## 2020-09-02 DIAGNOSIS — I10 Essential (primary) hypertension: Secondary | ICD-10-CM

## 2020-09-08 ENCOUNTER — Encounter: Payer: Self-pay | Admitting: Neurology

## 2020-09-08 ENCOUNTER — Ambulatory Visit (INDEPENDENT_AMBULATORY_CARE_PROVIDER_SITE_OTHER): Payer: Medicare Other | Admitting: Neurology

## 2020-09-08 ENCOUNTER — Other Ambulatory Visit: Payer: Self-pay

## 2020-09-08 ENCOUNTER — Telehealth: Payer: Self-pay | Admitting: Neurology

## 2020-09-08 VITALS — BP 131/78 | HR 72 | Ht 75.0 in | Wt 195.0 lb

## 2020-09-08 DIAGNOSIS — E559 Vitamin D deficiency, unspecified: Secondary | ICD-10-CM

## 2020-09-08 DIAGNOSIS — R269 Unspecified abnormalities of gait and mobility: Secondary | ICD-10-CM | POA: Insufficient documentation

## 2020-09-08 DIAGNOSIS — G629 Polyneuropathy, unspecified: Secondary | ICD-10-CM | POA: Diagnosis not present

## 2020-09-08 DIAGNOSIS — R413 Other amnesia: Secondary | ICD-10-CM | POA: Diagnosis not present

## 2020-09-08 DIAGNOSIS — R200 Anesthesia of skin: Secondary | ICD-10-CM | POA: Diagnosis not present

## 2020-09-08 MED ORDER — DONEPEZIL HCL 5 MG PO TABS: 5.0000 mg | ORAL_TABLET | Freq: Every day | ORAL | 5 refills | Status: DC

## 2020-09-08 NOTE — Progress Notes (Signed)
[facility]  PATIENT: Omar [name]. DOB: May 18, 1946  REFERRING DOCTOR OR PCP: Agustina Caroli MD SOURCE: Patient, notes from primary care, lab reports.  _________________________________   HISTORICAL  CHIEF COMPLAINT:  Chief Complaint  Patient presents with  . New Patient (Initial Visit)    RM 12. Internal referral from Agustina Caroli, MD for cognitive decline/memory deficit.     HISTORY OF PRESENT ILLNESS:  I had the pleasure of seeing patient, Omar [name], at [facility] for neurologic consultation regarding his cognitive decline.  He is a 74 year old man with progressive memory decline for the past 1+ years.   This is worsening and he now keeps asking the same questions.     He has no difficulty with iADL's.   He is driving an has not gotten lost.  He uses GPS for places not familiar to him (they went to [facility] recently).   He is needing more help with more complicated cognitive tasks.   He is more forgetful in general.    His wife notes he does not remember details of conversations.    He is not remembering to take his medications.  He has no difficulty with receptive or expressive language.    He has reduced focus/attention (his wife notes more than he does).  He denies any difficulty with sleep.  He denies depression or anxiety.  Physically he is doing well.   He has elevated cholesterol and mild GI issues (GERD and constipation).    Recent TSH, CBCs with differential and CMP are normal.  His wife notes his first cousin has Alzheimer's disease.  He has noted some numbness in his feet for a couple years.  Balance is slightly off at times.   No falls.     Montreal Cognitive Assessment  09/08/2020  Visuospatial/ Executive (0/5) 1  Naming (0/3) 3  Attention: Read list of digits (0/2) 1  Attention: Read list of letters (0/1) 1  Attention: Serial 7 subtraction starting at 100 (0/3) 1  Language: Repeat phrase (0/2) 1   Language : Fluency (0/1) 0  Abstraction (0/2) 1  Delayed Recall (0/5) 0  Orientation (0/6) 6  Total 15  Adjusted Score (based on education) 16     REVIEW OF SYSTEMS: Constitutional: No fevers, chills, sweats, or change in appetite.  He sleeps well most nights. Eyes: No visual changes, double vision, eye pain Ear, nose and throat: No hearing loss, ear pain, nasal congestion, sore throat Cardiovascular: No chest pain, palpitations Respiratory: No shortness of breath at rest or with exertion.   No wheezes GastrointestinaI: He notes GERD and constipation. Genitourinary: No dysuria, urinary retention or frequency.  No nocturia. Musculoskeletal: No neck pain, back pain Integumentary: No rash, pruritus, skin lesions Neurological: as above Psychiatric: No depression at this time.  No anxiety Endocrine: No palpitations, diaphoresis, change in appetite, change in weigh or increased thirst Hematologic/Lymphatic: No anemia, purpura, petechiae. Allergic/Immunologic: No itchy/runny eyes, nasal congestion, recent allergic reactions, rashes  ALLERGIES: Allergies  Allergen Reactions  . Sulfonamide Derivatives Other (See Comments)     blisters on skin    HOME MEDICATIONS:  Current Outpatient Medications:  .  esomeprazole (NEXIUM) 40 MG capsule, Take 1 capsule (40 mg total) by mouth daily. 30 min before  breakfast, Disp: 30 capsule, Rfl: 3 .  lubiprostone (AMITIZA) 24 MCG capsule, Take 1 capsule (24 mcg total) by mouth 2 (two) times daily with a meal., Disp: 180 capsule, Rfl: 3 .  OVER THE COUNTER MEDICATION, daily., Disp: , Rfl:  .  pravastatin (PRAVACHOL) 40 MG tablet, TAKE 1 TABLET BY MOUTH IN THE EVENING, Disp: 90 tablet, Rfl: 3  Current Facility-Administered Medications:  .  0.9 %  sodium chloride infusion, 500 mL, Intravenous, Once, Nandigam, Venia Minks, MD  PAST MEDICAL HISTORY: Past Medical History:  Diagnosis Date  . Anxiety   . BPH (benign prostatic hypertrophy)   .  Decreased white blood cell count   . Depression   . Diverticulosis   . Erectile dysfunction   . Gastric polyps   . GERD (gastroesophageal reflux disease)   . History of colonic polyps   . History of esophageal stricture 10/31/2007  . History of kidney stones   . History of prostate surgery   . History of rheumatic fever   . Hyperlipidemia   . Hypertension   . Prostate cancer ([place]) 11/09/2012  . Prostatism     PAST SURGICAL HISTORY: Past Surgical History:  Procedure Laterality Date  . CARPAL TUNNEL RELEASE     Bilateral  . ESOPHAGEAL DILATION     last dilation 8'12  . INGUINAL HERNIA REPAIR    . INSERTION OF MESH N/A 04/15/2013   Procedure: INSERTION OF MESH;  Surgeon: Odis Hollingshead, MD;  Location: [place];  Service: General;  Laterality: N/A;  . KNEE ARTHROSCOPY     left  . LAPAROSCOPIC RIGHT HEMI COLECTOMY Right 08/31/2017   Procedure: LAPAROSCOPIC ASSISTED  RIGHT HEMI COLECTOMY;  Surgeon: Johnathan Hausen, MD;  Location: [facility];  Service: General;  Laterality: Right;  . LEFT HEART CATHETERIZATION WITH CORONARY ANGIOGRAM N/A 07/18/2014   Procedure: LEFT HEART CATHETERIZATION WITH CORONARY ANGIOGRAM;  Surgeon: Burnell Blanks, MD;  Location: [facility] CATH [facility];  Service: Cardiovascular;  Laterality: N/A;  . ROBOT ASSISTED LAPAROSCOPIC RADICAL PROSTATECTOMY  11/08/2012   Procedure: ROBOTIC ASSISTED LAPAROSCOPIC RADICAL PROSTATECTOMY LEVEL 1;  Surgeon: Molli Hazard, MD;  Location: [facility];  Service: Urology;  Laterality: N/A;     . TONSILLECTOMY    . UMBILICAL HERNIA REPAIR N/A 04/15/2013   Procedure: HERNIA REPAIR UMBILICAL ADULT;  Surgeon: Odis Hollingshead, MD;  Location: [place];  Service: General;  Laterality: N/A;    FAMILY HISTORY: Family History  Problem Relation Age of Onset  . Ovarian cancer Mother   . Colon cancer Mother 75  . High Cholesterol Mother   . Stroke Father   . Lung cancer Maternal Uncle   . Lung cancer Maternal Aunt   . Esophageal cancer Neg Hx    . Stomach cancer Neg Hx   . Rectal cancer Neg Hx   . Pancreatic cancer Neg Hx   . Liver disease Neg Hx     SOCIAL HISTORY:  Social History   Socioeconomic History  . Marital status: Married    Spouse name: Not on file  . Number of children: 4  . Years of education: Not on file  . Highest education level: Not on file  Occupational History  . Occupation: Mining engineer: [employer]  Tobacco Use  . Smoking status: Former Smoker    Packs/day: 1.00    Years: 25.00    Pack years: 25.00    Types: Cigarettes    Quit date: 12/05/1993    Years since quitting: 26.7  . Smokeless tobacco: Never Used  Vaping Use  . Vaping Use: Never used  Substance and Sexual Activity  . Alcohol use: No  . Drug use: No  .  Sexual activity: Yes  Other Topics Concern  . Not on file  Social History Narrative   Lives with wife in a one story home.  Has 4 children.  Works as a Theme park manager.     Education: 10th grade.    Caffeine use: soda (one per day)   Right handed   Social Determinants of Health   Financial Resource Strain:   . Difficulty of Paying Living Expenses: Not on file  Food Insecurity:   . Worried About Charity fundraiser in the Last Year: Not on file  . Ran Out of Food in the Last Year: Not on file  Transportation Needs:   . Lack of Transportation (Medical): Not on file  . Lack of Transportation (Non-Medical): Not on file  Physical Activity:   . Days of Exercise per Week: Not on file  . Minutes of Exercise per Session: Not on file  Stress:   . Feeling of Stress : Not on file  Social Connections:   . Frequency of Communication with Friends and Family: Not on file  . Frequency of Social Gatherings with Friends and Family: Not on file  . Attends Religious Services: Not on file  . Active Member of Clubs or Organizations: Not on file  . Attends Archivist Meetings: Not on file  . Marital Status: Not on file  Intimate Partner Violence:   . Fear of Current  or Ex-Partner: Not on file  . Emotionally Abused: Not on file  . Physically Abused: Not on file  . Sexually Abused: Not on file     PHYSICAL EXAM  Vitals:   09/08/20 1036  BP: 131/78  Pulse: 72  SpO2: 98%  Weight: 195 lb (88.5 kg)  Height: 6' 3" (1.905 m)    Body mass index is 24.37 kg/m.   General: The patient is well-developed and well-nourished and in no acute distress  HEENT:  Head is [place]/AT.  Sclera are anicteric.  Funduscopic exam shows normal optic discs and retinal vessels.  Neck: No carotid bruits are noted.  The neck is nontender.  Cardiovascular: The heart has a regular rate and rhythm with a normal S1 and S2. There were no murmurs, gallops or rubs.    Skin: Extremities are without rash or  edema.  Musculoskeletal:  Back is nontender  Neurologic Exam  Mental status: The patient is alert and oriented x 3 at the time of the examination.  He had reduced short-term memory (0/5), reduced visual spatial/executive function and reduced focus/attention.  His Montreal cognitive assessment score was 16/30.   Speech is normal.  Cranial nerves: Extraocular movements are full.  There is good facial sensation to soft touch bilaterally.Facial strength is normal.  Trapezius and sternocleidomastoid strength is normal. No dysarthria is noted.  The tongue is midline, and the patient has symmetric elevation of the soft palate. No obvious hearing deficits are noted.  Motor:  Muscle bulk is normal.   Tone is normal. Strength is  5 / 5 in all 4 extremities.   Sensory: Sensory testing is intact to pinprick, soft touch and vibration sensation in the arms and knees.  He had about 20% reduced vibration sensation at the ankles and 75% reduced vibration sensation at the toes.  He also had reduced sensation to touch/pain in the feet more than the lower legs.  Coordination: Cerebellar testing reveals good finger-nose-finger and heel-to-shin bilaterally.  Gait and station: Station is normal.    Gait is normal. Tandem gait is moderately wide.  Romberg is negative.   Reflexes: Deep tendon reflexes are 1 and symmetric in the arms and knees and absent at the ankles.   Plantar responses are flexor.    DIAGNOSTIC DATA (LABS, IMAGING, TESTING) - I reviewed patient records, labs, notes, testing and imaging myself where available.  Lab Results  Component Value Date   WBC 3.0 (L) 05/26/2020   HGB 14.0 05/26/2020   HCT 42.7 05/26/2020   MCV 83 05/26/2020   PLT 173 05/26/2020      Component Value Date/Time   NA 141 05/26/2020 1506   K 4.4 05/26/2020 1506   CL 103 05/26/2020 1506   CO2 26 05/26/2020 1506   GLUCOSE 82 05/26/2020 1506   GLUCOSE 96 07/17/2019 1341   BUN 16 05/26/2020 1506   CREATININE 1.00 05/26/2020 1506   CALCIUM 8.9 05/26/2020 1506   PROT 6.3 05/26/2020 1506   ALBUMIN 4.3 05/26/2020 1506   AST 18 05/26/2020 1506   ALT 14 05/26/2020 1506   ALKPHOS 74 05/26/2020 1506   BILITOT 0.5 05/26/2020 1506   GFRNONAA 74 05/26/2020 1506   GFRAA 85 05/26/2020 1506   Lab Results  Component Value Date   CHOL 172 09/13/2017   HDL 46.40 09/13/2017   LDLCALC 105 (H) 09/13/2017   LDLDIRECT 114.0 08/16/2016   TRIG 104.0 09/13/2017   CHOLHDL 4 09/13/2017   Lab Results  Component Value Date   HGBA1C 5.5 08/31/2017   Lab Results  Component Value Date   VITAMINB12 1,148 (H) 08/16/2016   Lab Results  Component Value Date   TSH 1.270 05/26/2020       ASSESSMENT AND PLAN  Memory loss - Plan: Vitamin B12, Methylmalonic acid, serum, Multiple Myeloma Panel (SPEP&IFE w/QIG), Sjogren's syndrome antibods(ssa + ssb), VITAMIN D 25 Hydroxy (Vit-D Deficiency, Fractures), MR BRAIN WO CONTRAST  Polyneuropathy - Plan: Vitamin B12, Methylmalonic acid, serum, Multiple Myeloma Panel (SPEP&IFE w/QIG), Sjogren's syndrome antibods(ssa + ssb)  Numbness  Gait disturbance  Vitamin D deficiency - Plan: VITAMIN D 25 Hydroxy (Vit-D Deficiency, Fractures)   In summary, [name] is a  74 year old man with progressive memory and other cognitive decline over the past 1 to 2 years.  He missed most points for short-term recall and visual-spatial/executive function.  This pattern is consistent with Alzheimer's disease and I discussed with him and his wife that I believe that that is the most likely diagnosis.  However, he also has numbness in the feet consistent with a polyneuropathy and we need to check vitamin B12 and SPEP/IEF to rule out any deficiency and MGUS that could also be playing a role.  I will also check vitamin D and SSA/SSB.  We also need to check an MRI of the brain without contrast to determine the pattern of atrophy and whether or not significant ischemic changes are noted.  I went ahead and started donepezil 5 mg.  If he tolerates it this can be increased to 10 mg.  We will let him and his wife know the results of the lab work and the results of the MRI after it is performed .  I will see him back for a visit in about 3 to 4 months but they should call sooner if there are new or worsening neurologic symptoms.  Thank you for asking me to see [name] for a neurologic consultation.  Please let me know if I can be of further assistance with him or other patients in the future.   A. Felecia Shelling, MD, [facility] 09/08/2020,  72:53 AM Certified in Neurology, Clinical Neurophysiology, Sleep Medicine and Neuroimaging  [facility] [address], [place], [place] [address] [phone]

## 2020-09-08 NOTE — Telephone Encounter (Signed)
Medicare/thrivent order sent to GI. No auth they will reach out to the patient to schedule.

## 2020-09-12 LAB — MULTIPLE MYELOMA PANEL, SERUM
Albumin SerPl Elph-Mcnc: 3.6 g/dL (ref 2.9–4.4)
Albumin/Glob SerPl: 1.3 (ref 0.7–1.7)
Alpha 1: 0.2 g/dL (ref 0.0–0.4)
Alpha2 Glob SerPl Elph-Mcnc: 0.8 g/dL (ref 0.4–1.0)
B-Globulin SerPl Elph-Mcnc: 1 g/dL (ref 0.7–1.3)
Gamma Glob SerPl Elph-Mcnc: 0.8 g/dL (ref 0.4–1.8)
Globulin, Total: 2.8 g/dL (ref 2.2–3.9)
IgA/Immunoglobulin A, Serum: 134 mg/dL (ref 61–437)
IgG (Immunoglobin G), Serum: 769 mg/dL (ref 603–1613)
IgM (Immunoglobulin M), Srm: 47 mg/dL (ref 15–143)
Total Protein: 6.4 g/dL (ref 6.0–8.5)

## 2020-09-12 LAB — METHYLMALONIC ACID, SERUM: Methylmalonic Acid: 195 nmol/L (ref 0–378)

## 2020-09-12 LAB — SJOGREN'S SYNDROME ANTIBODS(SSA + SSB)
ENA SSA (RO) Ab: <0.2 AI (ref 0.0–0.9)
ENA SSB (LA) Ab: <0.2 AI (ref 0.0–0.9)

## 2020-09-12 LAB — VITAMIN B12: Vitamin B-12: 979 pg/mL (ref 232–1245)

## 2020-09-12 LAB — VITAMIN D 25 HYDROXY (VIT D DEFICIENCY, FRACTURES): Vit D, 25-Hydroxy: 29.6 ng/mL — ABNORMAL LOW (ref 30.0–100.0)

## 2020-09-12 IMAGING — DX CHEST - 2 VIEW
2 series · 2 of 2 positions shown · non-contrast
Comparison: 01/01/2019

CLINICAL DATA: Shortness of breath and nerve pains.

EXAM:
CHEST - 2 VIEW

[chest pa]
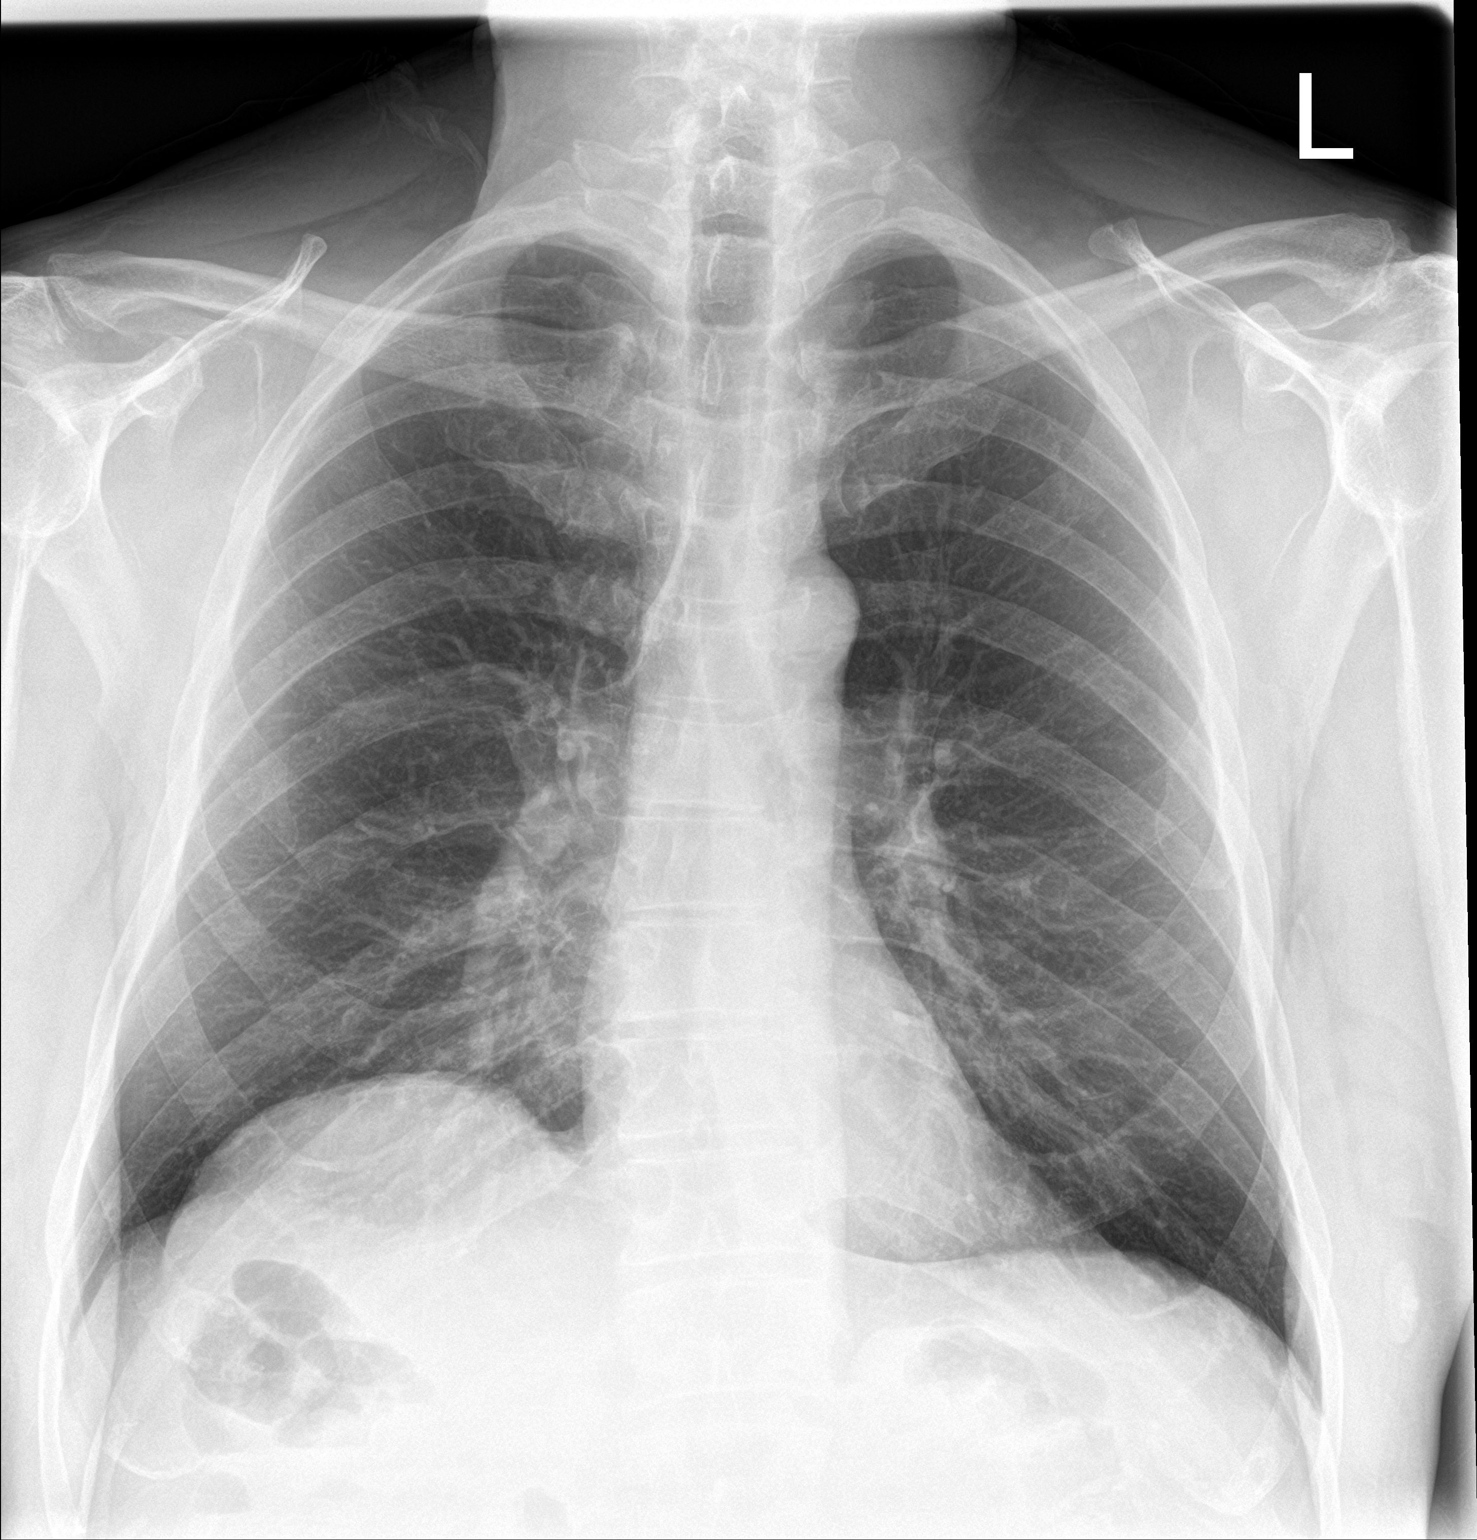

[chest lat]
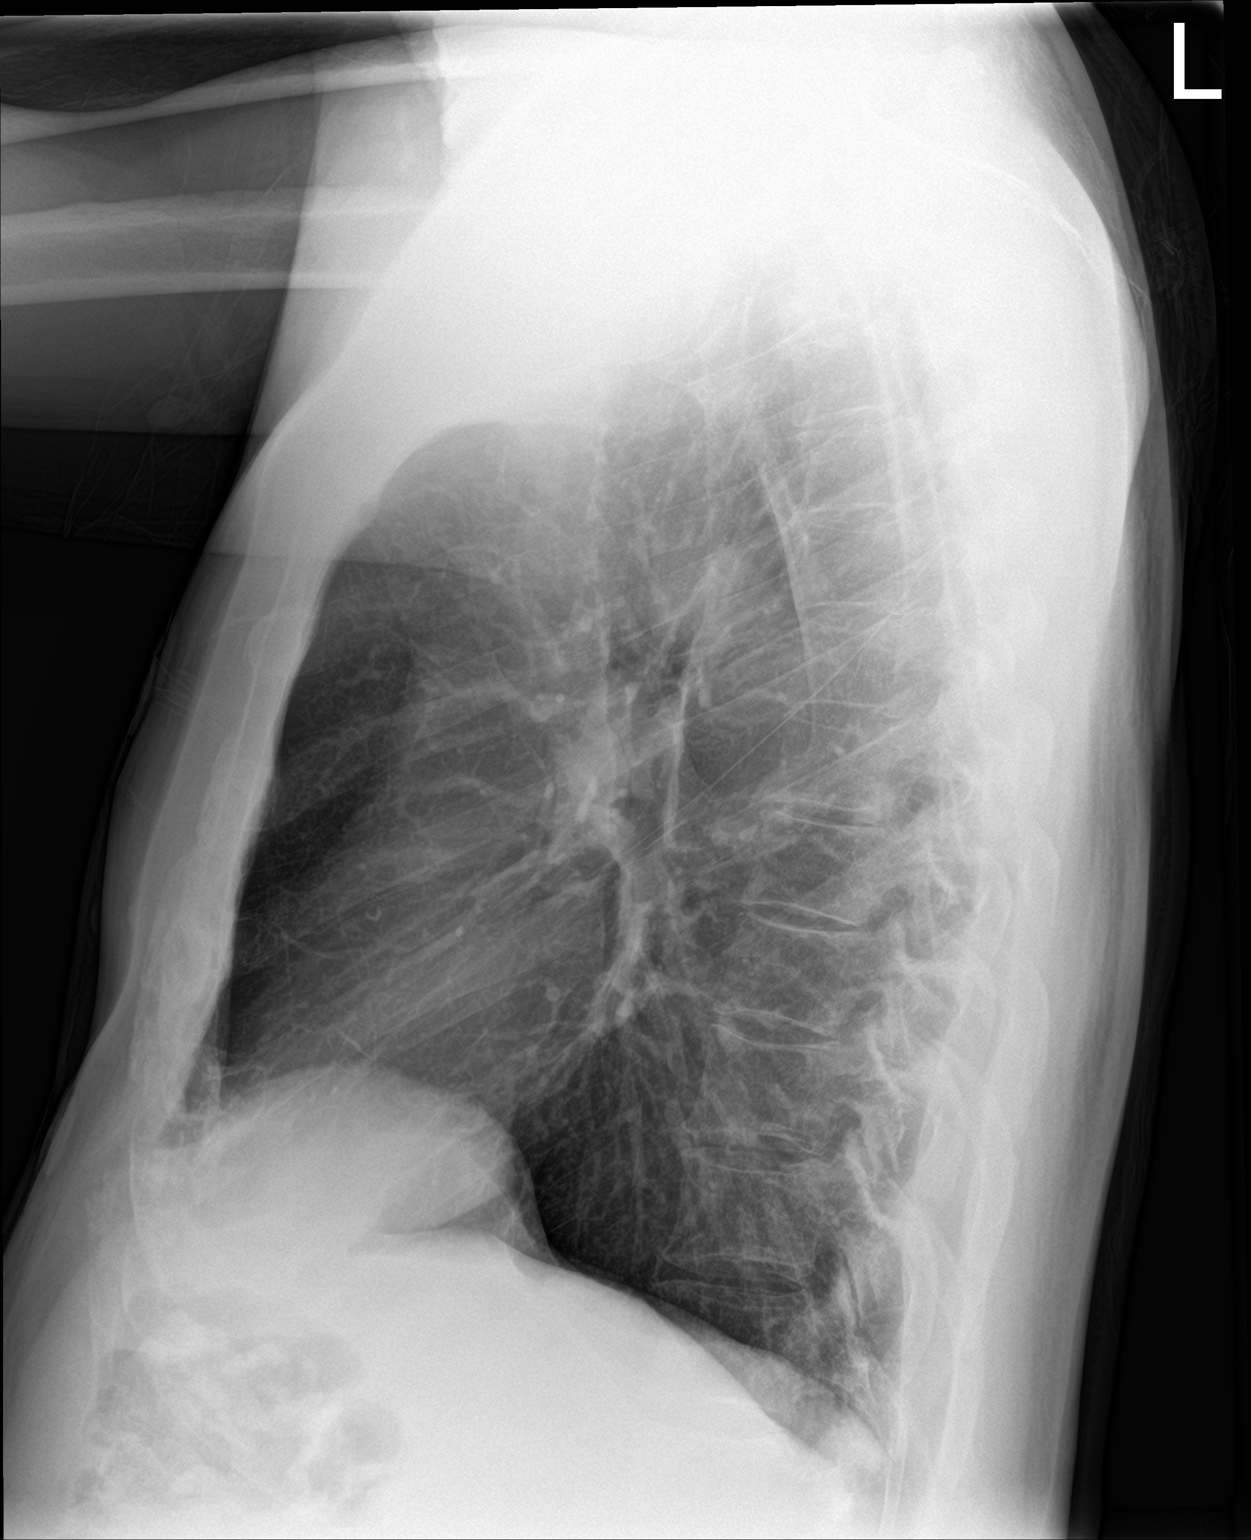

[2 of 2 positions shown; findings below may reference images not displayed]

FINDINGS: Lungs are adequately inflated and otherwise clear. Cardiomediastinal
silhouette and remainder of the exam is unchanged.
IMPRESSION: No active cardiopulmonary disease.

## 2020-09-19 ENCOUNTER — Inpatient Hospital Stay: Admission: RE | Admit: 2020-09-19 | Payer: Medicare Other | Source: Ambulatory Visit

## 2020-09-23 ENCOUNTER — Other Ambulatory Visit: Payer: Self-pay

## 2020-09-23 ENCOUNTER — Ambulatory Visit
Admission: RE | Admit: 2020-09-23 | Discharge: 2020-09-23 | Disposition: A | Discharge status: Home / Self Care | Payer: Medicare Other | Source: Ambulatory Visit | Attending: Neurology | Admitting: Neurology

## 2020-09-23 DIAGNOSIS — R413 Other amnesia: Secondary | ICD-10-CM | POA: Diagnosis not present

## 2020-11-05 DIAGNOSIS — Z23 Encounter for immunization: Secondary | ICD-10-CM | POA: Diagnosis not present

## 2020-11-26 DIAGNOSIS — D3502 Benign neoplasm of left adrenal gland: Secondary | ICD-10-CM | POA: Diagnosis not present

## 2020-11-26 DIAGNOSIS — D3501 Benign neoplasm of right adrenal gland: Secondary | ICD-10-CM | POA: Diagnosis not present

## 2020-11-26 DIAGNOSIS — N5201 Erectile dysfunction due to arterial insufficiency: Secondary | ICD-10-CM | POA: Diagnosis not present

## 2020-11-26 DIAGNOSIS — C61 Malignant neoplasm of prostate: Secondary | ICD-10-CM | POA: Diagnosis not present

## 2021-01-13 ENCOUNTER — Encounter: Payer: Self-pay | Admitting: Neurology

## 2021-01-13 ENCOUNTER — Ambulatory Visit (INDEPENDENT_AMBULATORY_CARE_PROVIDER_SITE_OTHER): Payer: Medicare Other | Admitting: Neurology

## 2021-01-13 VITALS — BP 138/78 | HR 67 | Ht 75.0 in | Wt 189.5 lb

## 2021-01-13 DIAGNOSIS — G629 Polyneuropathy, unspecified: Secondary | ICD-10-CM

## 2021-01-13 DIAGNOSIS — G4719 Other hypersomnia: Secondary | ICD-10-CM | POA: Insufficient documentation

## 2021-01-13 DIAGNOSIS — R269 Unspecified abnormalities of gait and mobility: Secondary | ICD-10-CM | POA: Diagnosis not present

## 2021-01-13 DIAGNOSIS — R0683 Snoring: Secondary | ICD-10-CM | POA: Diagnosis not present

## 2021-01-13 DIAGNOSIS — F0390 Unspecified dementia without behavioral disturbance: Secondary | ICD-10-CM | POA: Diagnosis not present

## 2021-01-13 MED ORDER — DONEPEZIL HCL 10 MG PO TABS
10.0000 mg | ORAL_TABLET | Freq: Every day | ORAL | 4 refills | Status: DC
Start: 2021-01-13 — End: 2022-01-13

## 2021-01-13 NOTE — Progress Notes (Signed)
[facility]  PATIENT: Omar [name]. DOB: February 16, 1946  REFERRING DOCTOR OR PCP: Agustina Caroli MD SOURCE: Patient, notes from primary care, lab reports.  _________________________________   HISTORICAL  CHIEF COMPLAINT:  Chief Complaint  Patient presents with  . Follow-up    RM 12 with wife. Last seen 09/08/2020.  Last [facility] 16/30. On Aricept.  No falls since last seen. No new sx. Today's MOCA: 16/30    HISTORY OF PRESENT ILLNESS:  Omar [name]  is a 75 year old man with progressive memory decline for the past 1+ years.    Update 01/13/2021 At the last visit, donepezil was prescribed.   He occasinally forgets the pill   This is worsening and he now keeps asking the same questions.     He has no difficulty with iADL's.   He is driving an has not gotten lost.   He is needing more help with more complicated cognitive tasks.   He is more forgetful in general.    His wife notes he does not remember details of conversations.    He is not remembering to take his medications.  He has no difficulty with receptive or expressive language.    He has reduced focus/attention (his wife notes more than he does).  He denies difficulty with remembering to pay bills or doing shopping and other more complicated chores.   He is driving and has had no difficulty.   He uses GPS for places not familiar to him.  We discussed that there will be a time soon when driving will not be a good idea.   .    He denies any difficulty with sleep.  He sleeps 6 hours at night.   He also takes 2 one hour naps, often while watching TV.    He snores but has no OSA He denies depression or anxiety.  Physically he is doing well.   He has elevated cholesterol and mild GI issues (GERD and constipation).    Recent TSH, CBCs with differential and CMP are normal.  His wife notes his first cousin has Alzheimer's disease.  He has noted some numbness in his feet for a couple years.  Balance is slightly off at  times.   No falls.   [facility] below.   He has a 9th grade education.  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        Montreal Cognitive Assessment  01/13/2021 09/08/2020  Visuospatial/ Executive (0/5) 1 1  Naming (0/3) 3 3  Attention: Read list of digits (0/2) 2 1  Attention: Read list of letters (0/1) 1 1  Attention: Serial 7 subtraction starting at 100 (0/3) 1 1  Language: Repeat phrase (0/2) 0 1  Language : Fluency (0/1) 0 0  Abstraction (0/2) 1 1  Delayed Recall (0/5) 0 0  Orientation (0/6) 6 6  Total 15 15  Adjusted Score (based on education) 16 16     REVIEW OF SYSTEMS: Constitutional: No fevers, chills, sweats, or change in appetite.  He sleeps well most nights. Eyes: No visual changes, double vision, eye pain Ear, nose and throat: No hearing loss, ear pain, nasal congestion, sore throat Cardiovascular: No chest pain, palpitations Respiratory: No shortness of breath at rest or with exertion.   No wheezes GastrointestinaI: He notes GERD and constipation. Genitourinary: No dysuria, urinary retention or frequency.  No nocturia. Musculoskeletal: No neck pain, back pain Integumentary: No rash, pruritus, skin lesions Neurological: as above Psychiatric: No  depression at this time.  No anxiety Endocrine: No palpitations, diaphoresis, change in appetite, change in weigh or increased thirst Hematologic/Lymphatic: No anemia, purpura, petechiae. Allergic/Immunologic: No itchy/runny eyes, nasal congestion, recent allergic reactions, rashes  ALLERGIES: Allergies  Allergen Reactions  . Sulfonamide Derivatives Other (See Comments)     blisters on skin    HOME MEDICATIONS:  Current Outpatient Medications:  .  esomeprazole (NEXIUM) 40 MG capsule, Take 1 capsule (40 mg total) by mouth daily. 30 min before  breakfast, Disp: 30 capsule, Rfl: 3 .  OVER THE COUNTER MEDICATION, daily., Disp: , Rfl:  .  pravastatin (PRAVACHOL) 40 MG tablet, TAKE 1 TABLET BY MOUTH IN THE EVENING,  Disp: 90 tablet, Rfl: 3 .  donepezil (ARICEPT) 10 MG tablet, Take 1 tablet (10 mg total) by mouth at bedtime., Disp: 90 tablet, Rfl: 4 .  lubiprostone (AMITIZA) 24 MCG capsule, Take 1 capsule (24 mcg total) by mouth 2 (two) times daily with a meal. (Patient not taking: Reported on 01/13/2021), Disp: 180 capsule, Rfl: 3  Current Facility-Administered Medications:  .  0.9 %  sodium chloride infusion, 500 mL, Intravenous, Once, Nandigam, Venia Minks, MD  PAST MEDICAL HISTORY: Past Medical History:  Diagnosis Date  . Anxiety   . BPH (benign prostatic hypertrophy)   . Decreased white blood cell count   . Depression   . Diverticulosis   . Erectile dysfunction   . Gastric polyps   . GERD (gastroesophageal reflux disease)   . History of colonic polyps   . History of esophageal stricture 10/31/2007  . History of kidney stones   . History of prostate surgery   . History of rheumatic fever   . Hyperlipidemia   . Hypertension   . Prostate cancer ([place]) 11/09/2012  . Prostatism     PAST SURGICAL HISTORY: Past Surgical History:  Procedure Laterality Date  . CARPAL TUNNEL RELEASE     Bilateral  . ESOPHAGEAL DILATION     last dilation 8'12  . INGUINAL HERNIA REPAIR    . INSERTION OF MESH N/A 04/15/2013   Procedure: INSERTION OF MESH;  Surgeon: Odis Hollingshead, MD;  Location: [place];  Service: General;  Laterality: N/A;  . KNEE ARTHROSCOPY     left  . LAPAROSCOPIC RIGHT HEMI COLECTOMY Right 08/31/2017   Procedure: LAPAROSCOPIC ASSISTED  RIGHT HEMI COLECTOMY;  Surgeon: Johnathan Hausen, MD;  [facility];  Service: General;  Laterality: Right;  . LEFT HEART CATHETERIZATION WITH CORONARY ANGIOGRAM N/A 07/18/2014   Procedure: LEFT HEART CATHETERIZATION WITH CORONARY ANGIOGRAM;  Surgeon: Burnell Blanks, MD;  [facility];  Service: Cardiovascular;  Laterality: N/A;  . ROBOT ASSISTED LAPAROSCOPIC RADICAL PROSTATECTOMY  11/08/2012   Procedure: ROBOTIC ASSISTED LAPAROSCOPIC RADICAL  PROSTATECTOMY LEVEL 1;  Surgeon: Molli Hazard, MD;  [facility];  Service: Urology;  Laterality: N/A;     . TONSILLECTOMY    . UMBILICAL HERNIA REPAIR N/A 04/15/2013   Procedure: HERNIA REPAIR UMBILICAL ADULT;  Surgeon: Odis Hollingshead, MD;  Location: [place];  Service: General;  Laterality: N/A;    FAMILY HISTORY: Family History  Problem Relation Age of Onset  . Ovarian cancer Mother   . Colon cancer Mother 3  . High Cholesterol Mother   . Stroke Father   . Lung cancer Maternal Uncle   . Lung cancer Maternal Aunt   . Esophageal cancer Neg Hx   . Stomach cancer Neg Hx   . Rectal cancer Neg Hx   .  Pancreatic cancer Neg Hx   . Liver disease Neg Hx     SOCIAL HISTORY:  Social History   Socioeconomic History  . Marital status: Married    Spouse name: Not on file  . Number of children: 4  . Years of education: Not on file  . Highest education level: Not on file  Occupational History  . Occupation: Mining engineer: [employer]  Tobacco Use  . Smoking status: Former Smoker    Packs/day: 1.00    Years: 25.00    Pack years: 25.00    Types: Cigarettes    Quit date: 12/05/1993    Years since quitting: 27.1  . Smokeless tobacco: Never Used  Vaping Use  . Vaping Use: Never used  Substance and Sexual Activity  . Alcohol use: No  . Drug use: No  . Sexual activity: Yes  Other Topics Concern  . Not on file  Social History Narrative   Lives with wife in a one story home.  Has 4 children.  Works as a Theme park manager.     Education: 10th grade.    Caffeine use: soda (one per day)   Right handed   Social Determinants of Health   Financial Resource Strain: Not on file  Food Insecurity: Not on file  Transportation Needs: Not on file  Physical Activity: Not on file  Stress: Not on file  Social Connections: Not on file  Intimate Partner Violence: Not on file     PHYSICAL EXAM  Vitals:   01/13/21 1050  BP: 138/78  Pulse: 67  SpO2: 92%   Weight: 189 lb 8 oz (86 kg)  Height: 6\' 3"  (1.905 m)    Body mass index is 23.69 kg/m.   General: The patient is well-developed and well-nourished and in no acute distress  HEENT:  Head is /AT.  Sclera are anicteric.    Skin: Extremities are without rash or edema.  Musculoskeletal:  Back is nontender  Neurologic Exam  Mental status: The patient is alert and oriented x 3 at the time of the examination.  He had reduced short-term memory (0/5), reduced visual spatial/executive function and reduced focus/attention.  His Montreal cognitive assessment score was 16/30.   Speech is normal.  Cranial nerves: Extraocular movements are full.   Facial strength and sensation was normal.  No obvious hearing deficits are noted.  Motor:  Muscle bulk is normal.   Tone is normal. Strength is  5 / 5 in all 4 extremities.   Sensory: Sensory testing is intact to pinprick, soft touch and vibration sensation in the arms and knees.  He had about 20% reduced vibration sensation at the ankles and 75% reduced vibration sensation at the toes.  He also had reduced sensation to touch/pain in the feet more than the lower legs.  Coordination: Cerebellar testing reveals good finger-nose-finger and heel-to-shin bilaterally.  Gait and station: Station is normal.   Gait is normal for his age and he can turn 180 degrees in 3 steps.. Tandem gait is moderately wide. Romberg is negative.   Reflexes: Deep tendon reflexes are 1 and symmetric in the arms and knees and absent at the ankles.     DIAGNOSTIC DATA (LABS, IMAGING, TESTING) - I reviewed patient records, labs, notes, testing and imaging myself where available.  Lab Results  Component Value Date   WBC 3.0 (L) 05/26/2020   HGB 14.0 05/26/2020   HCT 42.7 05/26/2020   MCV 83 05/26/2020   PLT 173  05/26/2020      Component Value Date/Time   NA 141 05/26/2020 1506   K 4.4 05/26/2020 1506   CL 103 05/26/2020 1506   CO2 26 05/26/2020 1506   GLUCOSE 82  05/26/2020 1506   GLUCOSE 96 07/17/2019 1341   BUN 16 05/26/2020 1506   CREATININE 1.00 05/26/2020 1506   CALCIUM 8.9 05/26/2020 1506   PROT 6.4 09/08/2020 1134   ALBUMIN 4.3 05/26/2020 1506   AST 18 05/26/2020 1506   ALT 14 05/26/2020 1506   ALKPHOS 74 05/26/2020 1506   BILITOT 0.5 05/26/2020 1506   GFRNONAA 74 05/26/2020 1506   GFRAA 85 05/26/2020 1506   Lab Results  Component Value Date   CHOL 172 09/13/2017   HDL 46.40 09/13/2017   LDLCALC 105 (H) 09/13/2017   LDLDIRECT 114.0 08/16/2016   TRIG 104.0 09/13/2017   CHOLHDL 4 09/13/2017   Lab Results  Component Value Date   HGBA1C 5.5 08/31/2017   Lab Results  Component Value Date   MOQHUTML46 503 09/08/2020   Lab Results  Component Value Date   TSH 1.270 05/26/2020       ASSESSMENT AND PLAN  Dementia without behavioral disturbance, unspecified dementia type (HCC)  Snoring  Gait disturbance  Excessive daytime sleepiness   1.   Cognition is stable and about the same as last visit.  We discussed increasing the donepezil from 5 mg to 10 mg.  I had a [place] discussion with him that and his wife.  He has mild to moderate dementia though the exact cause of the dementia is uncertain.  Clinically Alzheimer's is the most likely.  However, the atrophy pattern on MRI was nonspecific and other dementias are also possible..  Mood is doing better now and I have placed some role as well..  We could consider adding Namenda in the future though the benefit is often minimal at best.  I also discussed that aside participation several research studies for Alzheimer's and he may be eligible 2.   The wife will pay closer attention to his breathing at night.  He has excessive daytime sleepiness and snoring.  If she hears any OSA signs such as pauses, small to gasps, I will recommend that we do a sleep study to determine if he also has sleep apnea.  If he does, that could also impact cognition some.   3.   [place] discussion about driving.  It  would be best for him to begin to limit his driving and he will likely need to stop this year.  He should not drive without somebody else in the car or in bad weather or nighttime or [place] distances.  If he has an episode of getting lost while driving or if judgment seems to worsen, then he needs to quit completely  4.   Return as needed. rtc prn   45-minute office visit with the majority of the time spent face-to-face for history and physical, discussion/counseling and decision-making.  Additional time with record review and documentation.   Tahari Clabaugh A. Felecia Shelling, MD, [facility] 04/08/6567, 12:75 PM Certified in Neurology, Clinical Neurophysiology, Sleep Medicine and Neuroimaging  [facility] [address], [place], [place] [address] [phone]

## 2021-03-31 ENCOUNTER — Other Ambulatory Visit: Payer: Self-pay | Admitting: Emergency Medicine

## 2021-03-31 DIAGNOSIS — E785 Hyperlipidemia, unspecified: Secondary | ICD-10-CM

## 2021-03-31 MED ORDER — ROSUVASTATIN CALCIUM 20 MG PO TABS: 20.0000 mg | ORAL_TABLET | Freq: Every day | ORAL | 3 refills | Status: DC

## 2021-04-29 ENCOUNTER — Ambulatory Visit (INDEPENDENT_AMBULATORY_CARE_PROVIDER_SITE_OTHER): Payer: Medicare Other | Admitting: Emergency Medicine

## 2021-04-29 ENCOUNTER — Ambulatory Visit: Payer: Medicare Other | Admitting: Emergency Medicine

## 2021-04-29 ENCOUNTER — Other Ambulatory Visit: Payer: Self-pay

## 2021-04-29 ENCOUNTER — Encounter: Payer: Self-pay | Admitting: Emergency Medicine

## 2021-04-29 VITALS — BP 130/80 | HR 65 | Temp 98.5°F | Ht 75.0 in | Wt 186.8 lb

## 2021-04-29 DIAGNOSIS — E785 Hyperlipidemia, unspecified: Secondary | ICD-10-CM | POA: Diagnosis not present

## 2021-04-29 DIAGNOSIS — D171 Benign lipomatous neoplasm of skin and subcutaneous tissue of trunk: Secondary | ICD-10-CM | POA: Insufficient documentation

## 2021-04-29 DIAGNOSIS — F0390 Unspecified dementia without behavioral disturbance: Secondary | ICD-10-CM | POA: Diagnosis not present

## 2021-04-29 NOTE — Progress Notes (Signed)
Omar [name]. 75 y.o.   Chief Complaint  Patient presents with  . Knot on side    Pt has a small knot on his right side, pt wife noticed it 3 weeks ago. Pt states the knot is not painful.    HISTORY OF PRESENT ILLNESS: This is a 75 y.o. male complaining of nontender lump to right anterior lateral rib cage 4 weeks. No other associated symptoms.  No other complaints or medical concerns today. Accompanied by wife.  Patient was diagnosed with dementia last year.  Had complete neurological evaluation including MRI of brain and was evaluated by neurologist team.  Office visit notes reviewed by me.  HPI   Prior to Admission medications   Medication Sig Start Date End Date Taking? Authorizing Provider  donepezil (ARICEPT) 10 MG tablet Take 1 tablet (10 mg total) by mouth at bedtime. 01/13/21  Yes Sater, Nanine Means, MD  esomeprazole (NEXIUM) 40 MG capsule Take 1 capsule (40 mg total) by mouth daily. 30 min before  breakfast 07/08/19  Yes Nandigam, Venia Minks, MD  OVER THE COUNTER MEDICATION daily.   Yes [provider]  rosuvastatin (CRESTOR) 20 MG tablet Take 1 tablet (20 mg total) by mouth daily. 03/31/21  Yes Cara Thaxton, Ines Bloomer, MD  lubiprostone (AMITIZA) 24 MCG capsule Take 1 capsule (24 mcg total) by mouth 2 (two) times daily with a meal. 04/11/19   Nandigam, Venia Minks, MD  vardenafil (LEVITRA) 20 MG tablet Take 20 mg by mouth as needed.    02/29/12  [provider]    Allergies  Allergen Reactions  . Sulfonamide Derivatives Other (See Comments)     blisters on skin    Patient Active Problem List   Diagnosis Date Noted  . Snoring 01/13/2021  . Dementia without behavioral disturbance ([place]) 01/13/2021  . Excessive daytime sleepiness 01/13/2021  . Memory loss 09/08/2020  . Numbness 09/08/2020  . Gait disturbance 09/08/2020  . Diverticulosis 04/04/2019  . Internal hemorrhoids 04/04/2019  . 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  S/P right colectomy 08/31/2017  . Polyneuropathy 11/21/2016  . 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 History of pulmonary embolism 07/18/2014  . 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 Coronary atherosclerosis of native coronary artery 03/18/2014  . Hyperlipidemia 11/13/2013  . 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 Prostate cancer ([place]) 11/09/2012  . Stricture and stenosis of esophagus 05/31/2012  . Esophageal reflux 10/09/2007  . Dyslipidemia 07/16/2007  . Depression 07/16/2007  . COLONIC POLYPS, HX OF 07/16/2007    Past Medical History:  Diagnosis Date  . Anxiety   . BPH (benign prostatic hypertrophy)   . Decreased white blood cell count   . Depression   . Diverticulosis   . Erectile dysfunction   . Gastric polyps   . GERD (gastroesophageal reflux disease)   . History of colonic polyps   . History of esophageal stricture 10/31/2007  . History of kidney stones   . History of prostate surgery   . History of rheumatic fever   . Hyperlipidemia   . Hypertension   . Prostate cancer ([place]) 11/09/2012  . Prostatism     Past Surgical History:  Procedure Laterality Date  . CARPAL TUNNEL RELEASE     Bilateral  . ESOPHAGEAL DILATION     last dilation 8'12  . INGUINAL HERNIA REPAIR    . INSERTION OF MESH N/A 04/15/2013   Procedure: INSERTION OF MESH;  Surgeon: Rhunette Croft  Rosenbower, MD;  Location: [place];  Service: General;  Laterality: N/A;  . KNEE ARTHROSCOPY     left  . LAPAROSCOPIC RIGHT HEMI COLECTOMY Right 08/31/2017   Procedure: LAPAROSCOPIC ASSISTED  RIGHT HEMI COLECTOMY;  Surgeon: Johnathan Hausen, MD;  [facility];  Service: General;  Laterality: Right;  . LEFT HEART CATHETERIZATION WITH CORONARY ANGIOGRAM N/A 07/18/2014   Procedure: LEFT HEART CATHETERIZATION WITH CORONARY ANGIOGRAM;  Surgeon: Burnell Blanks, MD;  [facility] C[facility] LAB;  Service: Cardiovascular;  Laterality: N/A;  . ROBOT ASSISTED LAPAROSCOPIC RADICAL PROSTATECTOMY  11/08/2012    Procedure: ROBOTIC ASSISTED LAPAROSCOPIC RADICAL PROSTATECTOMY LEVEL 1;  Surgeon: Molli Hazard, MD;  [facility];  Service: Urology;  Laterality: N/A;     . TONSILLECTOMY    . UMBILICAL HERNIA REPAIR N/A 04/15/2013   Procedure: HERNIA REPAIR UMBILICAL ADULT;  Surgeon: Odis Hollingshead, MD;  Location: [place];  Service: General;  Laterality: N/A;    Social History   Socioeconomic History  . Marital status: Married    Spouse name: Not on file  . Number of children: 4  . Years of education: Not on file  . Highest education level: Not on file  Occupational History  . Occupation: Mining engineer: [employer]  Tobacco Use  . Smoking status: Former Smoker    Packs/day: 1.00    Years: 25.00    Pack years: 25.00    Types: Cigarettes    Quit date: 12/05/1993    Years since quitting: 27.4  . Smokeless tobacco: Never Used  Vaping Use  . Vaping Use: Never used  Substance and Sexual Activity  . Alcohol use: No  . Drug use: No  . Sexual activity: Yes  Other Topics Concern  . Not on file  Social History Narrative   Lives with wife in a one story home.  Has 4 children.  Works as a Theme park manager.     Education: 10th grade.    Caffeine use: soda (one per day)   Right handed   Social Determinants of Health   Financial Resource Strain: Not on file  Food Insecurity: Not on file  Transportation Needs: Not on file  Physical Activity: Not on file  Stress: Not on file  Social Connections: Not on file  Intimate Partner Violence: Not on file    Family History  Problem Relation Age of Onset  . Ovarian cancer Mother   . Colon cancer Mother 32  . High Cholesterol Mother   . Stroke Father   . Lung cancer Maternal Uncle   . Lung cancer Maternal Aunt   . Esophageal cancer Neg Hx   . Stomach cancer Neg Hx   . Rectal cancer Neg Hx   . Pancreatic cancer Neg Hx   . Liver disease Neg Hx      Review of Systems  Constitutional: Negative.  Negative for chills and  fever.  HENT: Negative.  Negative for congestion and sore throat.   Respiratory: Negative.  Negative for cough and shortness of breath.   Cardiovascular: Negative.  Negative for chest pain and palpitations.  Gastrointestinal: Negative.  Negative for abdominal pain, diarrhea, nausea and vomiting.  Genitourinary: Negative.  Negative for dysuria and hematuria.  Skin: Negative.  Negative for rash.  Neurological: Negative.  Negative for dizziness and headaches.  All other systems reviewed and are negative.  Today's Vitals   04/29/21 1336  BP: (!) 146/86  Pulse: 65  Temp: 98.5 F (36.9 C)  TempSrc: Oral  SpO2: 99%  Weight: 186 lb 12.8 oz (84.7 kg)  Height: 6\' 3"  (1.905 m)   Body mass index is 23.35 kg/m.   Physical Exam Vitals reviewed.  Constitutional:      Appearance: Normal appearance.  HENT:     Head: Normocephalic.  Eyes:     Extraocular Movements: Extraocular movements intact.     Pupils: Pupils are equal, round, and reactive to light.  Cardiovascular:     Rate and Rhythm: Normal rate and regular rhythm.     Pulses: Normal pulses.     Heart sounds: Normal heart sounds.  Pulmonary:     Effort: Pulmonary effort is normal.     Breath sounds: Normal breath sounds.  Musculoskeletal:        General: Normal range of motion.     Cervical back: Normal range of motion and neck supple.  Skin:    Capillary Refill: Capillary refill takes less than 2 seconds.     Findings: Lesion present.     Comments: Moderate sized lipoma to right anterior lateral chest wall  Neurological:     General: No focal deficit present.     Mental Status: He is alert and oriented to person, place, and time.  Psychiatric:        Mood and Affect: Mood normal.        Behavior: Behavior normal.      ASSESSMENT & PLAN: Lipoma of anterior chest wall Benign finding.  No complications.  No tenderness or infection.  No treatment necessary at this time.  Omar [name] was seen today for knot on  side.  Diagnoses and all orders for this visit:  Lipoma of anterior chest wall  Dementia without behavioral disturbance, unspecified dementia type ([place]) -     AMB Referral to [facility]ordin[facility]  Dyslipidemia -     AMB Referral to [place]     Patient Instructions  Lipoma  A lipoma is a noncancerous (benign) tumor that is made up of fat cells. This is a very common type of soft-tissue growth. Lipomas are usually found under the skin (subcutaneous). They may occur in any tissue of the body that contains fat. Common areas for lipomas to appear include the back, arms, shoulders, buttocks, and thighs. Lipomas grow slowly, and they are usually painless. Most lipomas do not cause problems and do not require treatment. What are the causes? The cause of this condition is not known. What increases the risk? You are more likely to develop this condition if:  You are 70-10 years old.  You have a family history of lipomas. What are the signs or symptoms? A lipoma usually appears as a small, round bump under the skin. In most cases, the lump will:  Feel soft or rubbery.  Not cause pain or other symptoms. However, if a lipoma is located in an area where it pushes on nerves, it can become painful or cause other symptoms. How is this diagnosed? A lipoma can usually be diagnosed with a physical exam. You may also have tests to confirm the diagnosis and to rule out other conditions. Tests may include:  Imaging tests, such as a CT scan or an MRI.  Removal of a tissue sample to be looked at under a microscope (biopsy). How is this treated? Treatment for this condition depends on the size of the lipoma and whether it is causing any symptoms.  For small lipomas that are not causing problems, no treatment is needed.  If a lipoma is bigger or it causes problems, surgery may be done to remove the lipoma. Lipomas can also be removed to improve appearance. Most often, the  procedure is done after applying a medicine that numbs the area (local anesthetic).  Liposuction may be done to reduce the size of the lipoma before it is removed through surgery, or it may be done to remove the lipoma. Lipomas are removed with this method in order to limit incision size and scarring. A liposuction tube is inserted through a small incision into the lipoma, and the contents of the lipoma are removed through the tube with suction. Follow these instructions at home:  Watch your lipoma for any changes.  Keep all follow-up visits as told by your health care provider. This is important. Contact a health care provider if:  Your lipoma becomes larger or hard.  Your lipoma becomes painful, red, or increasingly swollen. These could be signs of infection or a more serious condition. Get help right away if:  You develop tingling or numbness in an area near the lipoma. This could indicate that the lipoma is causing nerve damage. Summary  A lipoma is a noncancerous tumor that is made up of fat cells.  Most lipomas do not cause problems and do not require treatment.  If a lipoma is bigger or it causes problems, surgery may be done to remove the lipoma.  Contact a health care provider if your lipoma becomes larger or hard, or if it becomes painful, red, or increasingly swollen. Pain, redness, and swelling could be signs of infection or a more serious condition. This information is not intended to replace advice given to you by your health care provider. Make sure you discuss any questions you have with your health care provider. Document Revised: 07/08/2019 Document Reviewed: 07/08/2019 Elsevier Patient Education  2021 [place], MD [facility]

## 2021-04-29 NOTE — Assessment & Plan Note (Signed)
Benign finding.  No complications.  No tenderness or infection.  No treatment necessary at this time.

## 2021-04-29 NOTE — Patient Instructions (Signed)
Lipoma  A lipoma is a noncancerous (benign) tumor that is made up of fat cells. This is a very common type of soft-tissue growth. Lipomas are usually found under the skin (subcutaneous). They may occur in any tissue of the body that contains fat. Common areas for lipomas to appear include the back, arms, shoulders, buttocks, and thighs. Lipomas grow slowly, and they are usually painless. Most lipomas do not cause problems and do not require treatment. What are the causes? The cause of this condition is not known. What increases the risk? You are more likely to develop this condition if:  You are 40-60 years old.  You have a family history of lipomas. What are the signs or symptoms? A lipoma usually appears as a small, round bump under the skin. In most cases, the lump will:  Feel soft or rubbery.  Not cause pain or other symptoms. However, if a lipoma is located in an area where it pushes on nerves, it can become painful or cause other symptoms. How is this diagnosed? A lipoma can usually be diagnosed with a physical exam. You may also have tests to confirm the diagnosis and to rule out other conditions. Tests may include:  Imaging tests, such as a CT scan or an MRI.  Removal of a tissue sample to be looked at under a microscope (biopsy). How is this treated? Treatment for this condition depends on the size of the lipoma and whether it is causing any symptoms.  For small lipomas that are not causing problems, no treatment is needed.  If a lipoma is bigger or it causes problems, surgery may be done to remove the lipoma. Lipomas can also be removed to improve appearance. Most often, the procedure is done after applying a medicine that numbs the area (local anesthetic).  Liposuction may be done to reduce the size of the lipoma before it is removed through surgery, or it may be done to remove the lipoma. Lipomas are removed with this method in order to limit incision size and scarring. A  liposuction tube is inserted through a small incision into the lipoma, and the contents of the lipoma are removed through the tube with suction. Follow these instructions at home:  Watch your lipoma for any changes.  Keep all follow-up visits as told by your health care provider. This is important. Contact a health care provider if:  Your lipoma becomes larger or hard.  Your lipoma becomes painful, red, or increasingly swollen. These could be signs of infection or a more serious condition. Get help right away if:  You develop tingling or numbness in an area near the lipoma. This could indicate that the lipoma is causing nerve damage. Summary  A lipoma is a noncancerous tumor that is made up of fat cells.  Most lipomas do not cause problems and do not require treatment.  If a lipoma is bigger or it causes problems, surgery may be done to remove the lipoma.  Contact a health care provider if your lipoma becomes larger or hard, or if it becomes painful, red, or increasingly swollen. Pain, redness, and swelling could be signs of infection or a more serious condition. This information is not intended to replace advice given to you by your health care provider. Make sure you discuss any questions you have with your health care provider. Document Revised: 07/08/2019 Document Reviewed: 07/08/2019 Elsevier Patient Education  2021 [employer] Inc[employer]  

## 2021-05-14 ENCOUNTER — Telehealth: Payer: Self-pay | Admitting: *Deleted

## 2021-05-14 NOTE — Chronic Care Management (AMB) (Signed)
  Chronic Care Management   Note  05/14/2021 Name: Lawyer Washabaugh. MRN: [id number] DOB: [birth year]  [name] is a 75 y.o. year old male who is a primary care patient of Horald Pollen, MD. I reached out to [employer]. by phone today in response to a referral sent by [name]'s PCP, Horald Pollen, MD.      [name] was given information about Chronic Care Management services today including:  CCM service includes personalized support from designated clinical staff supervised by his physician, including individualized plan of care and coordination with other care providers 24/7 contact phone numbers for assistance for urgent and routine care needs. Service will only be billed when office clinical staff spend 20 minutes or more in a month to coordinate care. Only one practitioner may furnish and bill the service in a calendar month. The patient may stop CCM services at any time (effective at the end of the month) by phone call to the office staff. The patient will be responsible for cost sharing (co-pay) of up to 20% of the service fee (after annual deductible is met).  Patient agreed to services and verbal consent obtained.   Follow up plan: Telephone appointment with care management team member scheduled for:06/01/2021  [facility]

## 2021-06-01 ENCOUNTER — Telehealth: Payer: Medicare Other

## 2021-06-04 ENCOUNTER — Telehealth: Payer: Medicare Other

## 2021-06-04 ENCOUNTER — Telehealth: Payer: Self-pay | Admitting: *Deleted

## 2021-06-04 NOTE — Telephone Encounter (Signed)
  Chronic Care Management   Follow Up Note   06/04/2021 Name: Omar [name]. MRN: [id number] DOB: [birth year]  Referred by: Horald Pollen, MD Reason for referral : Chronic Care Management (CCM RN CM Initial Outreach attempt: Unsuccessful)  An unsuccessful telephone outreach was attempted today. The patient was referred to the case management team for assistance with care management and care coordination.   Follow Up Plan:  A HIPPA compliant phone message was left for the patient providing contact information and requesting a return call; did not hear back from patient by end of business day Will request scheduling care guide reach out to patient to re-schedule today's missed appointment for CCM RN CM initial outreach  Oneta Rack, RN, BSN, [place] [phone]: direct office [phone]: mobile

## 2021-06-11 ENCOUNTER — Other Ambulatory Visit: Payer: Self-pay | Admitting: Emergency Medicine

## 2021-06-11 DIAGNOSIS — E785 Hyperlipidemia, unspecified: Secondary | ICD-10-CM

## 2021-06-14 ENCOUNTER — Telehealth: Payer: Self-pay | Admitting: Emergency Medicine

## 2021-06-14 NOTE — Telephone Encounter (Signed)
    Should patient be taking Amlodipine? Not on med list If so, refill needed

## 2021-06-15 ENCOUNTER — Other Ambulatory Visit: Payer: Self-pay | Admitting: Emergency Medicine

## 2021-06-15 NOTE — Telephone Encounter (Signed)
Not at present time.  Thanks.

## 2021-06-15 NOTE — Progress Notes (Unsigned)
BP Readings from Last 3 Encounters:  04/29/21 130/80  01/13/21 138/78  09/08/20 131/78

## 2021-06-17 ENCOUNTER — Other Ambulatory Visit: Payer: Self-pay

## 2021-06-17 ENCOUNTER — Encounter: Payer: Self-pay | Admitting: Emergency Medicine

## 2021-06-17 ENCOUNTER — Ambulatory Visit (INDEPENDENT_AMBULATORY_CARE_PROVIDER_SITE_OTHER): Payer: Medicare Other

## 2021-06-17 ENCOUNTER — Ambulatory Visit (INDEPENDENT_AMBULATORY_CARE_PROVIDER_SITE_OTHER): Payer: Medicare Other | Admitting: Emergency Medicine

## 2021-06-17 VITALS — BP 140/82 | HR 67 | Temp 98.7°F | Ht 75.0 in | Wt 178.0 lb

## 2021-06-17 DIAGNOSIS — R634 Abnormal weight loss: Secondary | ICD-10-CM | POA: Diagnosis not present

## 2021-06-17 DIAGNOSIS — I10 Essential (primary) hypertension: Secondary | ICD-10-CM | POA: Diagnosis not present

## 2021-06-17 DIAGNOSIS — E785 Hyperlipidemia, unspecified: Secondary | ICD-10-CM

## 2021-06-17 DIAGNOSIS — F0390 Unspecified dementia without behavioral disturbance: Secondary | ICD-10-CM | POA: Diagnosis not present

## 2021-06-17 DIAGNOSIS — Z85038 Personal history of other malignant neoplasm of large intestine: Secondary | ICD-10-CM | POA: Diagnosis not present

## 2021-06-17 LAB — COMPREHENSIVE METABOLIC PANEL
ALT: 14 U/L (ref 0–53)
AST: 16 U/L (ref 0–37)
Albumin: 4.2 g/dL (ref 3.5–5.2)
Alkaline Phosphatase: 64 U/L (ref 39–117)
BUN: 13 mg/dL (ref 6–23)
CO2: 31 mEq/L (ref 19–32)
Calcium: 9.4 mg/dL (ref 8.4–10.5)
Chloride: 103 mEq/L (ref 96–112)
Creatinine, Ser: 1.05 mg/dL (ref 0.40–1.50)
GFR: 69.49 mL/min (ref >60.00)
Glucose, Bld: 81 mg/dL (ref 70–99)
Potassium: 3.8 mEq/L (ref 3.5–5.1)
Sodium: 140 mEq/L (ref 135–145)
Total Bilirubin: 1 mg/dL (ref 0.2–1.2)
Total Protein: 6.5 g/dL (ref 6.0–8.3)

## 2021-06-17 LAB — TSH: TSH: 1.09 u[IU]/mL (ref 0.35–5.50)

## 2021-06-17 LAB — SEDIMENTATION RATE: Sed Rate: 8 mm/hr (ref 0–20)

## 2021-06-17 NOTE — Assessment & Plan Note (Signed)
Differential diagnosis discussed including the possibility of cancer.  Needs GI eval evaluation for colonoscopy given his history of colon cancer.  Vasculitic process also needs to be considered.  Ex smoker.  Normal chest x-ray.  Needs blood work. Needs to increase daily caloric intake and stop using laxatives.  Has chronic constipation, advised to increase daily fiber intake and take daily Metamucil.

## 2021-06-17 NOTE — Telephone Encounter (Signed)
Called to advise pt on provider's recommendations, no answer. Pt has an OV today, will discuss with pt at that time.

## 2021-06-17 NOTE — Patient Instructions (Signed)
Health Maintenance After Age 75 After age 75, you are at a higher risk for certain [place]-term diseases and infections as well as injuries from falls. Falls are a major cause of broken bones and head injuries in people who are older than age 75. Getting regular preventive care can help to keep you healthy and well. Preventive care includes getting regular testing and making lifestyle changes as recommended by your health care provider. Talk with your health care provider about: Which screenings and tests you should have. A screening is a test that checks for a disease when you have no symptoms. A diet and exercise plan that is right for you. What should I know about screenings and tests to prevent falls? Screening and testing are the best ways to find a health problem early. Early diagnosis and treatment give you the best chance of managing medical conditions that are common after age 75. Certain conditions and lifestyle choices may make you more likely to have a fall. Your health care provider may recommend: Regular vision checks. Poor vision and conditions such as cataracts can make you more likely to have a fall. If you wear glasses, make sure to get your prescription updated if your vision changes. Medicine review. Work with your health care provider to regularly review all of the medicines you are taking, including over-the-counter medicines. Ask your health care provider about any side effects that may make you more likely to have a fall. Tell your health care provider if any medicines that you take make you feel dizzy or sleepy. Osteoporosis screening. Osteoporosis is a condition that causes the bones to get weaker. This can make the bones weak and cause them to break more easily. Blood pressure screening. Blood pressure changes and medicines to control blood pressure can make you feel dizzy. Strength and balance checks. Your health care provider may recommend certain tests to check your strength and  balance while standing, walking, or changing positions. Foot health exam. Foot pain and numbness, as well as not wearing proper footwear, can make you more likely to have a fall. Depression screening. You may be more likely to have a fall if you have a fear of falling, feel emotionally low, or feel unable to do activities that you used to do. Alcohol use screening. Using too much alcohol can affect your balance and may make you more likely to have a fall. What actions can I take to lower my risk of falls? General instructions Talk with your health care provider about your risks for falling. Tell your health care provider if: You fall. Be sure to tell your health care provider about all falls, even ones that seem minor. You feel dizzy, sleepy, or off-balance. Take over-the-counter and prescription medicines only as told by your health care provider. These include any supplements. Eat a healthy diet and maintain a healthy weight. A healthy diet includes low-fat dairy products, low-fat (lean) meats, and fiber from whole grains, beans, and lots of fruits and vegetables. Home safety Remove any tripping hazards, such as rugs, cords, and clutter. Install safety equipment such as grab bars in bathrooms and safety rails on stairs. Keep rooms and walkways well-lit. Activity  Follow a regular exercise program to stay fit. This will help you maintain your balance. Ask your health care provider what types of exercise are appropriate for you. If you need a cane or walker, use it as recommended by your health care provider. Wear supportive shoes that have nonskid soles.  Lifestyle Do   not drink alcohol if your health care provider tells you not to drink. If you drink alcohol, limit how much you have: 0-1 drink a day for women. 0-2 drinks a day for men. Be aware of how much alcohol is in your drink. In the [place] one drink equals one typical bottle of beer (12 oz), one-half glass of wine (5 oz), or one shot  of hard liquor (1 oz). Do not use any products that contain nicotine or tobacco, such as cigarettes and e-cigarettes. If you need help quitting, ask your health care provider. Summary Having a healthy lifestyle and getting preventive care can help to protect your health and wellness after age 75. Screening and testing are the best way to find a health problem early and help you avoid having a fall. Early diagnosis and treatment give you the best chance for managing medical conditions that are more common for people who are older than age 75. Falls are a major cause of broken bones and head injuries in people who are older than age 75. Take precautions to prevent a fall at home. Work with your health care provider to learn what changes you can make to improve your health and wellness and to prevent falls. This information is not intended to replace advice given to you by your health care provider. Make sure you discuss any questions you have with your healthcare provider. Document Revised: 11/06/2020 Document Reviewed: 11/06/2020 Elsevier Patient Education  2022 [employer] Inc.  

## 2021-06-17 NOTE — Progress Notes (Signed)
Omar [name]. 74 y.o.   Chief Complaint  Patient presents with   Nevus    Pt would like a mole on his right arm looked at. Pt states that he has some unintentional weight loss.    HISTORY OF PRESENT ILLNESS: This is a 75 y.o. male complaining of weight loss over the past several weeks. Here with his wife today.  She states he only eats small amount of breakfast, skips lunch, and then has dinner.  Patient states he has good appetite.  Denies any additional symptoms.  Has history of chronic constipation and takes laxatives regularly. Has history of colon cancer with right-sided colectomy in the past.  Able to eat and drink.  Denies difficulty swallowing.  Denies rectal bleeding.  Denies difficulty breathing or cough.  Stopped smoking 20 years ago.  Has dark mole on his right arm that he has had for many years.  No other complaints or medical concerns today.  Continues to be physically active at home.  HPI   Prior to Admission medications   Medication Sig Start Date End Date Taking? Authorizing Provider  donepezil (ARICEPT) 10 MG tablet Take 1 tablet (10 mg total) by mouth at bedtime. 01/13/21  Yes Sater, Nanine Means, MD  esomeprazole (NEXIUM) 40 MG capsule Take 1 capsule (40 mg total) by mouth daily. 30 min before  breakfast 07/08/19  Yes Nandigam, Venia Minks, MD  OVER THE COUNTER MEDICATION daily.   Yes [provider]  rosuvastatin (CRESTOR) 20 MG tablet Take 1 tablet (20 mg total) by mouth daily. 03/31/21  Yes Niaja Stickley, Ines Bloomer, MD  vardenafil (LEVITRA) 20 MG tablet Take 20 mg by mouth as needed.    02/29/12  [provider]    Allergies  Allergen Reactions   Sulfonamide Derivatives Other (See Comments)     blisters on skin    Patient Active Problem List   Diagnosis Date Noted   Lipoma of anterior chest wall 04/29/2021   Snoring 01/13/2021   Dementia without behavioral disturbance ([place]) 01/13/2021   Excessive daytime sleepiness 01/13/2021   Memory loss  09/08/2020   Numbness 09/08/2020   Gait disturbance 09/08/2020   Diverticulosis 04/04/2019   Internal hemorrhoids 04/04/2019   Dysplastic mass of the cecum s/p lap right colectomy 08/31/2017 09/02/2017   S/P right colectomy 08/31/2017   Polyneuropathy 11/21/2016   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   History of pulmonary embolism 07/18/2014   Adrenal adenoma 03/18/2014   Coronary atherosclerosis of native coronary artery 03/18/2014   Hyperlipidemia 11/13/2013   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/2013   Prostate cancer ([place]) 11/09/2012   Stricture and stenosis of esophagus 05/31/2012   Esophageal reflux 10/09/2007   Dyslipidemia 07/16/2007   Depression 07/16/2007   COLONIC POLYPS, HX OF 07/16/2007    Past Medical History:  Diagnosis Date   Anxiety    BPH (benign prostatic hypertrophy)    Decreased white blood cell count    Depression    Diverticulosis    Erectile dysfunction    Gastric polyps    GERD (gastroesophageal reflux disease)    History of colonic polyps    History of esophageal stricture 10/31/2007   History of kidney stones    History of prostate surgery    History of rheumatic fever    Hyperlipidemia    Hypertension    Prostate cancer ([place]) 11/09/2012   Prostatism     Past Surgical History:  Procedure Laterality Date   CARPAL TUNNEL RELEASE  Bilateral   ESOPHAGEAL DILATION     last dilation 8'12   INGUINAL HERNIA REPAIR     INSERTION OF MESH N/A 04/15/2013   Procedure: INSERTION OF MESH;  Surgeon: Odis Hollingshead, MD;  Location: [place];  Service: General;  Laterality: N/A;   KNEE ARTHROSCOPY     left   LAPAROSCOPIC RIGHT HEMI COLECTOMY Right 08/31/2017   Procedure: LAPAROSCOPIC ASSISTED  RIGHT HEMI COLECTOMY;  Surgeon: Johnathan Hausen, MD;  Location: [facility];  Service: General;  Laterality: Right;   LEFT HEART CATHETERIZATION WITH CORONARY ANGIOGRAM N/A 07/18/2014   Procedure: LEFT HEART CATHETERIZATION WITH CORONARY ANGIOGRAM;   Surgeon: Burnell Blanks, MD;  Location: [facility] C[facility];  Service: Cardiovascular;  Laterality: N/A;   ROBOT ASSISTED LAPAROSCOPIC RADICAL PROSTATECTOMY  11/08/2012   Procedure: ROBOTIC ASSISTED LAPAROSCOPIC RADICAL PROSTATECTOMY LEVEL 1;  Surgeon: Molli Hazard, MD;  Location: [facility];  Service: Urology;  Laterality: N/A;      TONSILLECTOMY     UMBILICAL HERNIA REPAIR N/A 04/15/2013   Procedure: HERNIA REPAIR UMBILICAL ADULT;  Surgeon: Odis Hollingshead, MD;  Location: [place];  Service: General;  Laterality: N/A;    Social History   Socioeconomic History   Marital status: Married    Spouse name: Not on file   Number of children: 4   Years of education: Not on file   Highest education level: Not on file  Occupational History   Occupation: Theme park manager    Employer: [employer]  Tobacco Use   Smoking status: Former    Packs/day: 1.00    Years: 25.00    Pack years: 25.00    Types: Cigarettes    Quit date: 12/05/1993    Years since quitting: 27.5   Smokeless tobacco: Never  Vaping Use   Vaping Use: Never used  Substance and Sexual Activity   Alcohol use: No   Drug use: No   Sexual activity: Yes  Other Topics Concern   Not on file  Social History Narrative   Lives with wife in a one story home.  Has 4 children.  Works as a Theme park manager.     Education: 10th grade.    Caffeine use: soda (one per day)   Right handed   Social Determinants of Health   Financial Resource Strain: Not on file  Food Insecurity: Not on file  Transportation Needs: Not on file  Physical Activity: Not on file  Stress: Not on file  Social Connections: Not on file  Intimate Partner Violence: Not on file    Family History  Problem Relation Age of Onset   Ovarian cancer Mother    Colon cancer Mother 46   High Cholesterol Mother    Stroke Father    Lung cancer Maternal Uncle    Lung cancer Maternal Aunt    Esophageal cancer Neg Hx    Stomach cancer Neg Hx    Rectal cancer Neg Hx     Pancreatic cancer Neg Hx    Liver disease Neg Hx      Review of Systems  Constitutional:  Positive for weight loss. Negative for chills and fever.  HENT: Negative.  Negative for congestion and sore throat.   Respiratory:  Negative for cough and hemoptysis.   Cardiovascular: Negative.  Negative for chest pain and palpitations.  Gastrointestinal:  Negative for abdominal pain, blood in stool, diarrhea, melena, nausea and vomiting.  Genitourinary: Negative.  Negative for dysuria and urgency.  Musculoskeletal: Negative.  Negative for back  pain, joint pain, myalgias and neck pain.  Skin: Negative.        Mole to right arm  Neurological: Negative.  Negative for dizziness and headaches.  All other systems reviewed and are negative.  Today's Vitals   06/17/21 1317  BP: 140/82  Pulse: 67  Temp: 98.7 F (37.1 C)  SpO2: 98%  Weight: 178 lb (80.7 kg)  Height: 6\' 3"  (1.905 m)   Body mass index is 22.25 kg/m. Wt Readings from Last 3 Encounters:  06/17/21 178 lb (80.7 kg)  04/29/21 186 lb 12.8 oz (84.7 kg)  01/13/21 189 lb 8 oz (86 kg)    Physical Exam Vitals reviewed.  Constitutional:      Appearance: Normal appearance.  HENT:     Head: Normocephalic.     Right Ear: Tympanic membrane, ear canal and external ear normal.     Left Ear: Tympanic membrane, ear canal and external ear normal.     Mouth/Throat:     Mouth: Mucous membranes are moist.     Pharynx: Oropharynx is clear.  Eyes:     Extraocular Movements: Extraocular movements intact.     Conjunctiva/sclera: Conjunctivae normal.     Pupils: Pupils are equal, round, and reactive to light.  Cardiovascular:     Rate and Rhythm: Normal rate and regular rhythm.     Pulses: Normal pulses.     Heart sounds: Normal heart sounds.  Pulmonary:     Effort: Pulmonary effort is normal.     Breath sounds: Normal breath sounds.  Abdominal:     General: Bowel sounds are normal. There is no distension.     Palpations: Abdomen is  soft. There is no mass.     Tenderness: There is no abdominal tenderness.  Musculoskeletal:        General: Normal range of motion.     Right lower leg: No edema.     Left lower leg: No edema.  Skin:    General: Skin is warm and dry.     Capillary Refill: Capillary refill takes less than 2 seconds.     Findings: Lesion (Pedunculated dark lump right upper arm) present.  Neurological:     General: No focal deficit present.     Mental Status: He is alert and oriented to person, place, and time.  Psychiatric:        Mood and Affect: Mood normal.        Behavior: Behavior normal.   DG Chest 2 View  Result Date: 06/17/2021 CLINICAL DATA:  Weight loss of 10 pounds over 2 months. Hypertension. Ex-smoker. EXAM: CHEST - 2 VIEW COMPARISON:  07/17/2019 FINDINGS: Mild hyperinflation. Mild right hemidiaphragm eventration anteriorly. Midline trachea. Normal heart size. Atherosclerosis in the transverse aorta. No pleural effusion or pneumothorax. Clear lungs. IMPRESSION: No acute cardiopulmonary disease. Electronically Signed   By: Abigail Miyamoto M.D.   On: 06/17/2021 14:57     ASSESSMENT & PLAN: A total of 30 minutes was spent with the patient and counseling/coordination of care regarding preparing for this visit, review of most recent office visit notes, review of most recent blood work results, review of today's chest x-ray, review of all medications, differential diagnosis of weight loss including cancer and vasculitic process, health maintenance items, need for GI evaluation and possible colonoscopy given his history of colon cancer, education on nutrition and need to increase daily caloric intake, fiber supplementation, prognosis, documentation and need for follow-up.  Loss of weight Differential diagnosis discussed including the possibility  of cancer.  Needs GI eval evaluation for colonoscopy given his history of colon cancer.  Vasculitic process also needs to be considered.  Ex smoker.  Normal chest  x-ray.  Needs blood work. Needs to increase daily caloric intake and stop using laxatives.  Has chronic constipation, advised to increase daily fiber intake and take daily Metamucil.  Peng was seen today for nevus.  Diagnoses and all orders for this visit:  Loss of weight -     Sedimentation rate -     DG Chest 2 View -     CBC with Differential/Platelet -     Comprehensive metabolic panel -     TSH -     Ambulatory referral to Gastroenterology  History of colon cancer -     Ambulatory referral to Gastroenterology  Dyslipidemia  Dementia without behavioral disturbance, unspecified dementia type [facility])  Patient Instructions  Health Maintenance After Age 58 After age 24, you are at a higher risk for certain [place]-term diseases and infections as well as injuries from falls. Falls are a major cause of broken bones and head injuries in people who are older than age 26. Getting regular preventive care can help to keep you healthy and well. Preventive care includes getting regular testing and making lifestyle changes as recommended by your health care provider. Talk with your health care provider about: Which screenings and tests you should have. A screening is a test that checks for a disease when you have no symptoms. A diet and exercise plan that is right for you. What should I know about screenings and tests to prevent falls? Screening and testing are the best ways to find a health problem early. Early diagnosis and treatment give you the best chance of managing medical conditions that are common after age 27. Certain conditions and lifestyle choices may make you more likely to have a fall. Your health care provider may recommend: Regular vision checks. Poor vision and conditions such as cataracts can make you more likely to have a fall. If you wear glasses, make sure to get your prescription updated if your vision changes. Medicine review. Work with your health care provider to regularly  review all of the medicines you are taking, including over-the-counter medicines. Ask your health care provider about any side effects that may make you more likely to have a fall. Tell your health care provider if any medicines that you take make you feel dizzy or sleepy. Osteoporosis screening. Osteoporosis is a condition that causes the bones to get weaker. This can make the bones weak and cause them to break more easily. Blood pressure screening. Blood pressure changes and medicines to control blood pressure can make you feel dizzy. Strength and balance checks. Your health care provider may recommend certain tests to check your strength and balance while standing, walking, or changing positions. Foot health exam. Foot pain and numbness, as well as not wearing proper footwear, can make you more likely to have a fall. Depression screening. You may be more likely to have a fall if you have a fear of falling, feel emotionally low, or feel unable to do activities that you used to do. Alcohol use screening. Using too much alcohol can affect your balance and may make you more likely to have a fall. What actions can I take to lower my risk of falls? General instructions Talk with your health care provider about your risks for falling. Tell your health care provider if: You fall. Be sure to  tell your health care provider about all falls, even ones that seem minor. You feel dizzy, sleepy, or off-balance. Take over-the-counter and prescription medicines only as told by your health care provider. These include any supplements. Eat a healthy diet and maintain a healthy weight. A healthy diet includes low-fat dairy products, low-fat (lean) meats, and fiber from whole grains, beans, and lots of fruits and vegetables. Home safety Remove any tripping hazards, such as rugs, cords, and clutter. Install safety equipment such as grab bars in bathrooms and safety rails on stairs. Keep rooms and walkways  well-lit. Activity  Follow a regular exercise program to stay fit. This will help you maintain your balance. Ask your health care provider what types of exercise are appropriate for you. If you need a cane or walker, use it as recommended by your health care provider. Wear supportive shoes that have nonskid soles.  Lifestyle Do not drink alcohol if your health care provider tells you not to drink. If you drink alcohol, limit how much you have: 0-1 drink a day for women. 0-2 drinks a day for men. Be aware of how much alcohol is in your drink. In the [place] one drink equals one typical bottle of beer (12 oz), one-half glass of wine (5 oz), or one shot of hard liquor (1 oz). Do not use any products that contain nicotine or tobacco, such as cigarettes and e-cigarettes. If you need help quitting, ask your health care provider. Summary Having a healthy lifestyle and getting preventive care can help to protect your health and wellness after age 13. Screening and testing are the best way to find a health problem early and help you avoid having a fall. Early diagnosis and treatment give you the best chance for managing medical conditions that are more common for people who are older than age 55. Falls are a major cause of broken bones and head injuries in people who are older than age 27. Take precautions to prevent a fall at home. Work with your health care provider to learn what changes you can make to improve your health and wellness and to prevent falls. This information is not intended to replace advice given to you by your health care provider. Make sure you discuss any questions you have with your healthcare provider. Document Revised: 11/06/2020 Document Reviewed: 11/06/2020 Elsevier Patient Education  2022 [place], MD [facility]

## 2021-06-18 ENCOUNTER — Telehealth: Payer: Self-pay

## 2021-06-18 ENCOUNTER — Other Ambulatory Visit: Payer: Self-pay | Admitting: Emergency Medicine

## 2021-06-18 DIAGNOSIS — E785 Hyperlipidemia, unspecified: Secondary | ICD-10-CM

## 2021-06-18 DIAGNOSIS — R634 Abnormal weight loss: Secondary | ICD-10-CM

## 2021-06-18 DIAGNOSIS — D72819 Decreased white blood cell count, unspecified: Secondary | ICD-10-CM

## 2021-06-18 LAB — CBC WITH DIFFERENTIAL/PLATELET
Basophils Absolute: 0 10*3/uL (ref 0.0–0.1)
Basophils Relative: 1.5 % (ref 0.0–3.0)
Eosinophils Absolute: 0.2 10*3/uL (ref 0.0–0.7)
Eosinophils Relative: 7.3 % — ABNORMAL HIGH (ref 0.0–5.0)
HCT: 41.5 % (ref 39.0–52.0)
Hemoglobin: 14.1 g/dL (ref 13.0–17.0)
Lymphocytes Relative: 35 % (ref 12.0–46.0)
Lymphs Abs: 0.9 10*3/uL (ref 0.7–4.0)
MCHC: 34 g/dL (ref 30.0–36.0)
MCV: 82.1 fl (ref 78.0–100.0)
Monocytes Absolute: 0.3 10*3/uL (ref 0.1–1.0)
Monocytes Relative: 11 % (ref 3.0–12.0)
Neutro Abs: 1.2 10*3/uL — ABNORMAL LOW (ref 1.4–7.7)
Neutrophils Relative %: 45.2 % (ref 43.0–77.0)
Platelets: 180 10*3/uL (ref 150.0–400.0)
RBC: 5.06 Mil/uL (ref 4.22–5.81)
RDW: 13.2 % (ref 11.5–15.5)
WBC: 2.6 10*3/uL — ABNORMAL LOW (ref 4.0–10.5)

## 2021-06-18 NOTE — Telephone Encounter (Signed)
-----   Message from [facility], MD sent at 06/18/2021 10:55 AM EDT ----- Call patient or patient's wife please.  Blood work shows low white cell count or leukopenia.  Recommend hematology evaluation of weight loss and low white cell count.  Referral placed today.  Thanks.

## 2021-06-18 NOTE — Telephone Encounter (Signed)
Pt notified of PCP result note & verb understanding.  Denies ques/concerns at this time.

## 2021-06-28 ENCOUNTER — Ambulatory Visit (INDEPENDENT_AMBULATORY_CARE_PROVIDER_SITE_OTHER): Payer: Medicare Other | Admitting: *Deleted

## 2021-06-28 DIAGNOSIS — E785 Hyperlipidemia, unspecified: Secondary | ICD-10-CM

## 2021-06-28 DIAGNOSIS — F0390 Unspecified dementia without behavioral disturbance: Secondary | ICD-10-CM

## 2021-06-28 NOTE — Chronic Care Management (AMB) (Signed)
Chronic Care Management   CCM RN Visit Note  06/28/2021 Name: Omar [name]. MRN: [id number] DOB: [birth year]  Subjective: Omar [name]. is a 75 y.o. year old male who is a primary care patient of Mitchel Honour, Ines Bloomer, MD. The care management team was consulted for assistance with disease management and care coordination needs.    Engaged with patient by telephone for initial visit in response to provider referral for case management and/or care coordination services.   Consent to Services:  The patient was given information about Chronic Care Management services, agreed to services, and gave verbal consent 05/14/21 prior to initiation of services.  Please see initial visit note for detailed documentation.   Patient agreed to services and verbal consent obtained.   Assessment: Review of patient past medical history, allergies, medications, health status, including review of consultants reports, laboratory and other test data, was performed as part of comprehensive evaluation and provision of chronic care management services.   SDOH (Social Determinants of Health) assessments and interventions performed:  SDOH Interventions    Flowsheet Row Most Recent Value  SDOH Interventions   Transportation Interventions Intervention Not Indicated       CCM Care Plan  Allergies  Allergen Reactions   Sulfonamide Derivatives Other (See Comments)     blisters on skin    Outpatient Encounter Medications as of 06/28/2021  Medication Sig Note   donepezil (ARICEPT) 10 MG tablet Take 1 tablet (10 mg total) by mouth at bedtime.    esomeprazole (NEXIUM) 40 MG capsule Take 1 capsule (40 mg total) by mouth daily. 30 min before  breakfast    OVER THE COUNTER MEDICATION daily. 01/21/2020: As needed Premier protein   rosuvastatin (CRESTOR) 20 MG tablet Take 1 tablet (20 mg total) by mouth daily.    [DISCONTINUED] vardenafil (LEVITRA) 20 MG tablet Take 20 mg by mouth as needed.       Facility-Administered Encounter Medications as of 06/28/2021  Medication   0.9 %  sodium chloride infusion    Patient Active Problem List   Diagnosis Date Noted   History of colon cancer 06/17/2021   Lipoma of anterior chest wall 04/29/2021   Snoring 01/13/2021   Dementia without behavioral disturbance ([place]) 01/13/2021   Excessive daytime sleepiness 01/13/2021   Memory loss 09/08/2020   Numbness 09/08/2020   Gait disturbance 09/08/2020   Diverticulosis 04/04/2019   Internal hemorrhoids 04/04/2019   Loss of weight 01/01/2019   Dysplastic mass of the cecum s/p lap right colectomy 08/31/2017 09/02/2017   S/P right colectomy 08/31/2017   Polyneuropathy 11/21/2016   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   History of pulmonary embolism 07/18/2014   Adrenal adenoma 03/18/2014   Coronary atherosclerosis of native coronary artery 03/18/2014   Hyperlipidemia 11/13/2013   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/2013   Prostate cancer ([place]) 11/09/2012   Stricture and stenosis of esophagus 05/31/2012   Esophageal reflux 10/09/2007   Dyslipidemia 07/16/2007   Depression 07/16/2007   COLONIC POLYPS, HX OF 07/16/2007    Conditions to be addressed/monitored: HLD and Dementia  Care Plan : General Plan of Care (Adult)  Updates made by Knox Royalty, RN since 06/28/2021 12:00 AM     Problem: Therapeutic Alliance (General Plan of Care)   Priority: Medium     Goal: Therapeutic Alliance Established   Start Date: 06/28/2021  Expected End Date: 07/28/2021  This Visit's Progress: On track  Priority: Medium  Note:   Current Barriers:  Ineffective Self Health  Maintenance in a patient with HLD and Dementia Reports he is having memory loss issues and has lost weight for unknown reason Clinical Goal(s):  Collaboration with Sagardia, Ines Bloomer, MD regarding development and update of comprehensive plan of care as evidenced by provider attestation and  co-signature Inter-disciplinary care team collaboration (see longitudinal plan of care) patient will work with care management team to address care coordination and chronic disease management needs related to Disease Management   Interventions:  Evaluation of current treatment plan related to HLD and Dementia, self-management and patient's adherence to plan as established by provider Collaboration with Horald Pollen, MD regarding development and update of comprehensive plan of care as evidenced by provider attestation       and co-signature Inter-disciplinary care team collaboration (see longitudinal plan of care) Discussed purpose of call today with patient- he reports initially that he is available to complete initial assessment as scheduled, however, within 5 minutes of checking patient in for telephone appointment, he states that he is driving and will not be able to complete entire assessment; role of CCM CM team/ RN CM explained to patient- initial assessment initiated; we re-scheduled today's phone call for next week.  Encouraged patient to be at home with medications to review at time of next scheduled call to complete initial assessment- he is agreeable Discussed current clinical condition with patient: he reports "doing fine;" reports has "memory issues," and "has lost weight, about 35 pounds," but feels as if he is doing good overall "for a 75 year old man" Discussed memory issues/ HLD, chronic conditions with patient- he confirms that he is established with neurology provider Reviewed recent lab work with patient: he acknowledges that he has new patient appointment with hematology provider to discuss his recent lab work (decreased WBC) Reviewed upcoming scheduled appointments with patient: hematology, as above- July 16, 2021- patient verbalizes plans to attend as scheduled Discussed plans with patient for ongoing care management follow up and atempted to provide him with direct  contact information for care management team- he is driving and unable to accept information: will place AVS in mail to patient in follow up [place] Activities:  Patient verbalizes understanding of plan to complete initial phone call/ assessment with CCM RN CM on Friday July 09, 2021 at 11:30 am Attends all scheduled provider appointments Performs ADL's independently Performs IADL's independently Calls provider office for new concerns or questions Patient Goals: Discuss my treatment options with the doctor or nurse Make shared treatment decisions with doctor  Dr. Mitchel Honour has asked me to contact you to make sure everything is in place for you to successfully manage your health care I will contact you by phone on Friday July 09, 2021 at 11:30: please have your medications ready for [place] to go over, and please have something to write with in case you want to make notes about our phone call Follow Up Plan:  Telephone follow up appointment with care management team member scheduled for: Friday July 09, 2021 at 11:30 am The patient has been provided with contact information for the care management team and has been advised to call with any health related questions or concerns.      Plan: Telephone follow up appointment with care management team member scheduled for:  Friday July 09, 2021 at 11:30 am The patient has been provided with contact information for the care management team via printed AVS and has been advised to call with any health related questions or concerns  Richarda Osmond  Jamse Arn, RN, BSN, [facility] RN Care Coordination- [place] [phone]: direct office [phone]: mobile

## 2021-06-28 NOTE — Patient Instructions (Signed)
Visit Information   Omar [name], it was nice talking with you today.   Please read over the attached information, and be ready for our next phone call as we scheduled today.  Please have your medications ready for [place] to go over when I call you    I look forward to talking to you again by phone for an update on Friday July 09, 2021 at 11:30 am-- please be listening out for my call that day.  I will call as close to 11:30 am as possible.  If you need to cancel or re-schedule our telephone visit, please call [phone] and one of our care guides will be happy to assist you.  I look forward to hearing about your progress.   Please don't hesitate to contact me if I can be of assistance to you before our next scheduled appointment.   Oneta Rack, RN, BSN, [facility] RN Care Coordination- [place] [phone]: direct office [phone]: mobile    PATIENT GOALS:   Goals Addressed             This Visit's Progress    Maintain My Quality of Life   On track    Timeframe:  [place] Goal Priority:  Medium Start Date:     06/28/21                        Expected End Date:    07/28/21                   Follow Up Date 07/09/21    Discuss my treatment options with the doctor or nurse Make shared treatment decisions with doctor  Dr. Mitchel Honour has asked me to contact you to make sure everything is in place for you to successfully manage your health care I will contact you by phone on Friday July 09, 2021 at 11:30: please have your medications ready for [place] to go over, and please have something to write with in case you want to make notes about our phone call             Consent to CCM Services: Omar [name] was given information about Chronic Care Management services today including:  CCM service includes personalized support from designated clinical staff supervised by his physician, including individualized plan of care and coordination with other care  providers 24/7 contact phone numbers for assistance for urgent and routine care needs. Service will only be billed when office clinical staff spend 20 minutes or more in a month to coordinate care. Only one practitioner may furnish and bill the service in a calendar month. The patient may stop CCM services at any time (effective at the end of the month) by phone call to the office staff. The patient will be responsible for cost sharing (co-pay) of up to 20% of the service fee (after annual deductible is met).  Patient agreed to services and verbal consent obtained.   The patient verbalized understanding of instructions, educational materials, and care plan provided today and agreed to receive a mailed copy of patient instructions, educational materials, and care plan.  Telephone follow up appointment with care management team member scheduled for:  Friday July 09, 2021 at 11:30 am The patient has been provided with contact information for the care management team and has been advised to call with any health related questions or concerns.   Oneta Rack, RN, BSN, [facility]  RN Care Coordination- [place] [phone]: direct office [phone]: mobile   CLINICAL CARE PLAN: Patient Care Plan: General Plan of Care (Adult)     Problem Identified: Therapeutic Alliance (General Plan of Care)   Priority: Medium     Goal: Therapeutic Alliance Established   Start Date: 06/28/2021  Expected End Date: 07/28/2021  This Visit's Progress: On track  Priority: Medium  Note:   Current Barriers:  Ineffective Self Health Maintenance in a patient with HLD and Dementia Reports he is having memory loss issues and has lost weight for unknown reason Clinical Goal(s):  Collaboration with Omar Pollen, MD regarding development and update of comprehensive plan of care as evidenced by provider attestation and co-signature Inter-disciplinary care team collaboration  (see longitudinal plan of care) patient will work with care management team to address care coordination and chronic disease management needs related to Disease Management   Interventions:  Evaluation of current treatment plan related to HLD and Dementia, self-management and patient's adherence to plan as established by provider Collaboration with Omar Pollen, MD regarding development and update of comprehensive plan of care as evidenced by provider attestation       and co-signature Inter-disciplinary care team collaboration (see longitudinal plan of care) Discussed purpose of call today with patient- he reports initially that he is available to complete initial assessment as scheduled, however, within 5 minutes of checking patient in for telephone appointment, he states that he is driving and will not be able to complete entire assessment; role of CCM CM team/ RN CM explained to patient- initial assessment initiated; we re-scheduled today's phone call for next week.  Encouraged patient to be at home with medications to review at time of next scheduled call to complete initial assessment- he is agreeable Discussed current clinical condition with patient: he reports "doing fine;" reports has "memory issues," and "has lost weight, about 35 pounds," but feels as if he is doing good overall "for a 75 year old man" Discussed memory issues/ HLD, chronic conditions with patient- he confirms that he is established with neurology provider Reviewed recent lab work with patient: he acknowledges that he has new patient appointment with hematology provider to discuss his recent lab work (decreased WBC) Reviewed upcoming scheduled appointments with patient: hematology, as above- July 16, 2021- patient verbalizes plans to attend as scheduled Discussed plans with patient for ongoing care management follow up and atempted to provide him with direct contact information for care management team- he is driving  and unable to accept information: will place AVS in mail to patient in follow up [place] Activities:  Patient verbalizes understanding of plan to complete initial phone call/ assessment with CCM RN CM on Friday July 09, 2021 at 11:30 am Attends all scheduled provider appointments Performs ADL's independently Performs IADL's independently Calls provider office for new concerns or questions Patient Goals: Discuss my treatment options with the doctor or nurse Make shared treatment decisions with doctor  Dr. Mitchel Honour has asked me to contact you to make sure everything is in place for you to successfully manage your health care I will contact you by phone on Friday July 09, 2021 at 11:30: please have your medications ready for [place] to go over, and please have something to write with in case you want to make notes about our phone call Follow Up Plan:  Telephone follow up appointment with care management team member scheduled for: Friday July 09, 2021 at 11:30 am The patient has  been provided with contact information for the care management team and has been advised to call with any health related questions or concerns.

## 2021-07-09 ENCOUNTER — Ambulatory Visit (INDEPENDENT_AMBULATORY_CARE_PROVIDER_SITE_OTHER): Payer: Medicare Other | Admitting: *Deleted

## 2021-07-09 DIAGNOSIS — R413 Other amnesia: Secondary | ICD-10-CM

## 2021-07-09 DIAGNOSIS — E785 Hyperlipidemia, unspecified: Secondary | ICD-10-CM | POA: Diagnosis not present

## 2021-07-09 DIAGNOSIS — F0390 Unspecified dementia without behavioral disturbance: Secondary | ICD-10-CM | POA: Diagnosis not present

## 2021-07-09 NOTE — Chronic Care Management (AMB) (Signed)
Chronic Care Management   CCM RN Visit Note  07/09/2021 Name: Omar [name]. MRN: [id number] DOB: October 18, 1946  Subjective: Omar [name]. is a 75 y.o. year old male who is a primary care patient of Alvy Bimler, Eilleen Kempf, MD. The care management team was consulted for assistance with disease management and care coordination needs.    Engaged with patient and spouse Omar [name], on [facility] DPR by telephone for follow up visit in response to provider referral for case management and/or care coordination services.   Consent to Services:  The patient was given information about Chronic Care Management services, agreed to services, and gave verbal consent prior to initiation of services.  Please see initial visit note for detailed documentation.  Patient agreed to services and verbal consent obtained.   Assessment: Review of patient past medical history, allergies, medications, health status, including review of consultants reports, laboratory and other test data, was performed as part of comprehensive evaluation and provision of chronic care management services.   SDOH (Social Determinants of Health) assessments and interventions performed:  SDOH Interventions    Flowsheet Row Most Recent Value  SDOH Interventions   Food Insecurity Interventions Intervention Not Indicated  Housing Interventions Intervention Not Indicated       CCM Care Plan  Allergies  Allergen Reactions   Sulfonamide Derivatives Other (See Comments)     blisters on skin   Outpatient Encounter Medications as of 07/09/2021  Medication Sig Note   donepezil (ARICEPT) 10 MG tablet Take 1 tablet (10 mg total) by mouth at bedtime.    esomeprazole (NEXIUM) 40 MG capsule Take 1 capsule (40 mg total) by mouth daily. 30 min before  breakfast 07/09/2021: 07/09/21: Patient reports takes OTC as needed   Multiple Vitamin (MULTIVITAMIN) tablet Take 1 tablet by mouth daily.    Omega-3 Fatty Acids (FISH OIL) 1200 MG CAPS Take by mouth.     OVER THE COUNTER MEDICATION daily. 01/21/2020: As needed Premier protein   phentolamine (REGITINE) 5 MG injection once. Patient reports takes once q 2 weeks- prescribed by "Dr. Sammuel Hines, cancer doctor"    psyllium (METAMUCIL) 58.6 % powder Take 1 packet by mouth daily.    rosuvastatin (CRESTOR) 20 MG tablet Take 1 tablet (20 mg total) by mouth daily.    [DISCONTINUED] vardenafil (LEVITRA) 20 MG tablet Take 20 mg by mouth as needed.      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 08/31/2017 09/02/2017   S/P right colectomy 08/31/2017   Polyneuropathy 11/21/2016   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   History of pulmonary embolism 07/18/2014   Adrenal adenoma 03/18/2014   Coronary atherosclerosis of native coronary artery 03/18/2014   Hyperlipidemia 11/13/2013   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/2013   Prostate cancer (HCC) 11/09/2012   Stricture and stenosis of esophagus 05/31/2012   Esophageal reflux 10/09/2007   Dyslipidemia 07/16/2007   Depression 07/16/2007   COLONIC POLYPS, HX OF 07/16/2007  Conditions to be addressed/monitored:  HLD and memory loss/ dementia  Care Plan : General Plan of Care (Adult)  Updates made by Knox Royalty, RN since 07/09/2021 12:00 AM     Problem: Therapeutic Alliance (General Plan of Care)   Priority: Medium     Goal: Therapeutic Alliance Established   Start Date: 06/28/2021   Expected End Date: 07/28/2021  This Visit's Progress: On track  Recent Progress: On track  Priority: Medium  Note:   Current Barriers:  Ineffective Self Health Maintenance in a patient with HLD and Dementia Reports he is having memory loss issues and has lost weight for unknown reason Clinical Goal(s): Goal Met 07/09/21 Collaboration with Horald Pollen, MD regarding development and update of comprehensive plan of care as evidenced by provider attestation and co-signature Inter-disciplinary care team collaboration (see longitudinal plan of care) patient will work with care management team to address care coordination and chronic disease management needs related to Disease Management   Interventions:  Evaluation of current treatment plan related to HLD and Dementia, self-management and patient's adherence to plan as established by provider Collaboration with Horald Pollen, MD regarding development and update of comprehensive plan of care as evidenced by provider attestation       and co-signature Inter-disciplinary care team collaboration (see longitudinal plan of care) CCM RN CM Initial Assessment completed, care planning development in progress Self Care Activities:  Attends all scheduled provider appointments Performs ADL's independently Performs IADL's independently Calls provider office for new concerns or questions Patient Goals: Discuss treatment options with the doctor or nurse Make shared treatment decisions with doctor  Dr. Mitchel Honour has asked me to contact you to make sure everything is in place for you to successfully manage your health care Follow Up Plan:  The patient has been provided with contact information for the care management team and has been advised to call with any health related questions or concerns.      Care Plan : RN Care Manager Plan of Care  Updates made by Knox Royalty, RN since 07/09/2021 12:00 AM     Problem: Chronic Disease  management Needs   Priority: Medium     [place] Goal: Development of plan of care for [place] term chronic disease management   Start Date: 07/09/2021  Expected End Date: 07/09/2022  Priority: Medium  Note:   Current Barriers:  Chronic Disease Management support and education needs related to HLD and memory loss/ dementia Recent weight loss of ~35 pounds in 4 months, per patient report  RNCM Clinical Goal(s):  Patient will demonstrate ongoing adherence to prescribed treatment plan for HLD and memory loss as evidenced by adherence to prescribed medication regimen contacting provider for new or worsened symptoms or questions attending all provider appointments, ongoing adherence to diet low in cholesterol  through collaboration with RN Care manager, provider, and care team.   Interventions: 1:1 collaboration with primary care provider regarding development and update of comprehensive plan of care as evidenced by provider attestation and co-signature Inter-disciplinary care team collaboration (see longitudinal plan of care) Evaluation of current treatment plan related to  self management and patient's adherence to plan as established by provider;  Hyperlipidemia:  (Status: New goal.) Lab Results  Component Value Date   CHOL 172 09/13/2017   HDL 46.40 09/13/2017   LDLCALC 105 (H) 09/13/2017   LDLDIRECT 114.0 08/16/2016   TRIG 104.0 09/13/2017   CHOLHDL 4 09/13/2017    Medication review performed; medication list updated in electronic  medical record.  Counseled on importance of regular laboratory monitoring as prescribed; Provided HLD educational materials; Reviewed role and benefits of statin for ASCVD risk reduction; Reviewed importance of limiting foods high in cholesterol; Screening for signs and symptoms of depression related to chronic disease state;  Assessed social determinant of health barriers;  Confirmed patient/ spouse aware of upcoming new patient appointment with hematology  provider 07/16/21: confirmed patient has plans to attend as scheduled; encouraged patient/ spouse to write any questions they have down on paper to share with hematology provider Reviewed recent lab work and discussed decreased WBC value Reviewed recent PCP provider appointment 04/29/21  Memory Loss  (Status: New goal.) Evaluation of current treatment plan related to  memory loss/ dementia ,  self-management and patient's adherence to plan as established by provider. Discussed plans with patient for ongoing care management follow up and provided patient with direct contact information for care management team Reviewed medications with patient and discussed ongoing adherence; no concerns or discrepancies noted from medication review; Reviewed scheduled/upcoming provider appointments including neurology provider: 01/13/22: provided with phone number of provider as FYI; Discussed plans with patient for ongoing care management follow up and provided patient with direct contact information for care management team; Advised patient to discuss progression of memory loss with provider;  Patient Goals/Self-Care Activities: Patient will attend all scheduled provider appointments Patient will call pharmacy for medication refills Patient will call provider office for new concerns or questions Patient will continue to follow heart healthy, low salt, low cholesterol diet       Plan: Telephone follow up appointment with care management team member scheduled for:  Tuesday August 10, 2021 at 11:30 am The patient has been provided with contact information for the care management team and has been advised to call with any health related questions or concerns.   Oneta Rack, RN, BSN, [facility] RN Care Coordination- [place] [phone]: direct office [phone]: mobile

## 2021-07-09 NOTE — Patient Instructions (Signed)
Visit Information  Omar [name]") it was nice talking with you and Omar [name] today.   I look forward to talking to you again for an update on Tuesday August 10, 2021 at 11:30 am- please be listening out for my call that day.  I will call as close to 11:30 am as possible.   If you need to cancel or re-schedule our telephone visit, please call [phone] and one of our care guides will be happy to assist you.   I look forward to hearing about your progress.   Please don't hesitate to contact me if I can be of assistance to you before our next scheduled telephone appointment.   Oneta Rack, RN, BSN, [facility] RN Care Coordination- [place] [phone]: direct office [phone]: mobile   PATIENT GOALS:  Goals Addressed             This Visit's Progress    Maintain My Quality of Life   On track    Timeframe:  [name] Goal Priority:  Medium Start Date:     06/28/21                        Expected End Date:    07/28/21                   Follow Up Date 07/09/21    Discuss treatment options with the doctor or nurse Make shared treatment decisions with doctor  Dr. Mitchel Honour has asked me to contact you to make sure everything is in place for you to successfully manage your health care            Patient self- Care Activities   On track    Timeframe:  [name] Goal Priority:  Medium Start Date:        07/09/21                     Expected End Date:    07/09/22                   Patient will attend all scheduled provider appointments Patient will call pharmacy for medication refills Patient will call provider office for new concerns or questions Patient will continue to follow heart healthy, low salt, low cholesterol diet       Cholesterol Content in Foods Cholesterol is a waxy, fat-like substance that helps to carry fat in the blood. The body needs cholesterol in small amounts, but too much cholesterol can causedamage to the  arteries and heart. Most people should eat less than 200 milligrams (mg) of cholesterol a day. Foods with cholesterol  Cholesterol is found in animal-based foods, such as meat, seafood, and dairy. Generally, low-fat dairy and lean meats have less cholesterol than full-fat dairy and fatty meats. The milligrams of cholesterol per serving (mg per serving) of common cholesterol-containing foods are listed below. Meat and other proteins Egg -- one large whole egg has 186 mg. Veal shank -- 4 oz has 141 mg. Lean ground [place] (93% lean) -- 4 oz has 118 mg. Fat-trimmed lamb loin -- 4 oz has 106 mg. Lean ground beef (90% lean) -- 4 oz has 100 mg. Lobster -- 3.5 oz has 90 mg. Pork loin chops -- 4 oz has 86 mg. Canned salmon -- 3.5 oz has 83 mg. Fat-trimmed beef top loin -- 4 oz has 78 mg. Frankfurter -- 1 frank (3.5 oz) has 77 mg.  Crab -- 3.5 oz has 71 mg. Roasted chicken without skin, white meat -- 4 oz has 66 mg. Light bologna -- 2 oz has 45 mg. Deli-cut [place] -- 2 oz has 31 mg. Canned tuna -- 3.5 oz has 31 mg. Omar [name] -- 1 oz has 29 mg. Oysters and mussels (raw) -- 3.5 oz has 25 mg. Mackerel -- 1 oz has 22 mg. Trout -- 1 oz has 20 mg. Pork sausage -- 1 link (1 oz) has 17 mg. Salmon -- 1 oz has 16 mg. Tilapia -- 1 oz has 14 mg. Dairy Soft-serve ice cream --  cup (4 oz) has 103 mg. Whole-milk yogurt -- 1 cup (8 oz) has 29 mg. Cheddar cheese -- 1 oz has 28 mg. American cheese -- 1 oz has 28 mg. Whole milk -- 1 cup (8 oz) has 23 mg. 2% milk -- 1 cup (8 oz) has 18 mg. Cream cheese -- 1 tablespoon (Tbsp) has 15 mg. Cottage cheese --  cup (4 oz) has 14 mg. Low-fat (1%) milk -- 1 cup (8 oz) has 10 mg. Sour cream -- 1 Tbsp has 8.5 mg. Low-fat yogurt -- 1 cup (8 oz) has 8 mg. Nonfat Greek yogurt -- 1 cup (8 oz) has 7 mg. Half-and-half cream -- 1 Tbsp has 5 mg. Fats and oils Cod liver oil -- 1 tablespoon (Tbsp) has 82 mg. Butter -- 1 Tbsp has 15 mg. Lard -- 1 Tbsp has 14 mg. Bacon grease  -- 1 Tbsp has 14 mg. Mayonnaise -- 1 Tbsp has 5-10 mg. Margarine -- 1 Tbsp has 3-10 mg. Exact amounts of cholesterol in these foods may vary depending on specificingredients and brands. Foods without cholesterol Most plant-based foods do not have cholesterol unless you combine them with a food that has cholesterol. Foods without cholesterol include: Grains and cereals. Vegetables. Fruits. Vegetable oils, such as olive, canola, and sunflower oil. Legumes, such as peas, beans, and lentils. Nuts and seeds. Egg whites. Summary The body needs cholesterol in small amounts, but too much cholesterol can cause damage to the arteries and heart. Most people should eat less than 200 milligrams (mg) of cholesterol a day. This information is not intended to replace advice given to you by your health care provider. Make sure you discuss any questions you have with your healthcare provider. Document Revised: 03/03/2020 Document Reviewed: 04/13/2020 Elsevier Patient Education  [name] patient verbalized understanding of instructions, educational materials, and care plan provided today and agreed to receive a mailed copy of patient instructions, educational materials, and care plan.  Telephone follow up appointment with care management team member scheduled for:   Tuesday August 10, 2021 at 11:30 am The patient has been provided with contact information for the care management team and has been advised to call with any health related questions or concerns.   Oneta Rack, RN, BSN, [facility] RN Care Coordination- [place] [phone]: direct office [phone]: mobile

## 2021-07-16 ENCOUNTER — Inpatient Hospital Stay (HOSPITAL_COMMUNITY): Payer: Medicare Other | Attending: Hematology and Oncology | Admitting: Hematology and Oncology

## 2021-07-16 ENCOUNTER — Other Ambulatory Visit: Payer: Self-pay

## 2021-07-16 ENCOUNTER — Encounter (HOSPITAL_COMMUNITY): Payer: Self-pay | Admitting: Hematology and Oncology

## 2021-07-16 ENCOUNTER — Inpatient Hospital Stay (HOSPITAL_COMMUNITY): Payer: Medicare Other

## 2021-07-16 VITALS — BP 156/87 | HR 68 | Temp 98.2°F | Resp 16 | Wt 177.8 lb

## 2021-07-16 DIAGNOSIS — D72819 Decreased white blood cell count, unspecified: Secondary | ICD-10-CM | POA: Diagnosis not present

## 2021-07-16 DIAGNOSIS — Z87891 Personal history of nicotine dependence: Secondary | ICD-10-CM | POA: Diagnosis not present

## 2021-07-16 DIAGNOSIS — I10 Essential (primary) hypertension: Secondary | ICD-10-CM | POA: Insufficient documentation

## 2021-07-16 DIAGNOSIS — Z8546 Personal history of malignant neoplasm of prostate: Secondary | ICD-10-CM

## 2021-07-16 DIAGNOSIS — R718 Other abnormality of red blood cells: Secondary | ICD-10-CM

## 2021-07-16 DIAGNOSIS — R634 Abnormal weight loss: Secondary | ICD-10-CM | POA: Diagnosis not present

## 2021-07-16 DIAGNOSIS — Z801 Family history of malignant neoplasm of trachea, bronchus and lung: Secondary | ICD-10-CM | POA: Insufficient documentation

## 2021-07-16 DIAGNOSIS — Z8041 Family history of malignant neoplasm of ovary: Secondary | ICD-10-CM | POA: Diagnosis not present

## 2021-07-16 DIAGNOSIS — Z79899 Other long term (current) drug therapy: Secondary | ICD-10-CM | POA: Diagnosis not present

## 2021-07-16 DIAGNOSIS — Z808 Family history of malignant neoplasm of other organs or systems: Secondary | ICD-10-CM | POA: Insufficient documentation

## 2021-07-16 DIAGNOSIS — F0390 Unspecified dementia without behavioral disturbance: Secondary | ICD-10-CM | POA: Insufficient documentation

## 2021-07-16 DIAGNOSIS — R5383 Other fatigue: Secondary | ICD-10-CM | POA: Insufficient documentation

## 2021-07-16 DIAGNOSIS — K5909 Other constipation: Secondary | ICD-10-CM | POA: Insufficient documentation

## 2021-07-16 LAB — CBC WITH DIFFERENTIAL/PLATELET
Abs Immature Granulocytes: 0 K/uL (ref 0.00–0.07)
Basophils Absolute: 0 K/uL (ref 0.0–0.1)
Basophils Relative: 2 %
Eosinophils Absolute: 0.2 K/uL (ref 0.0–0.5)
Eosinophils Relative: 8 %
HCT: 43.6 % (ref 39.0–52.0)
Hemoglobin: 14.1 g/dL (ref 13.0–17.0)
Immature Granulocytes: 0 %
Lymphocytes Relative: 35 %
Lymphs Abs: 0.8 K/uL (ref 0.7–4.0)
MCH: 28 pg (ref 26.0–34.0)
MCHC: 32.3 g/dL (ref 30.0–36.0)
MCV: 86.7 fL (ref 80.0–100.0)
Monocytes Absolute: 0.2 K/uL (ref 0.1–1.0)
Monocytes Relative: 9 %
Neutro Abs: 1.1 K/uL — ABNORMAL LOW (ref 1.7–7.7)
Neutrophils Relative %: 46 %
Platelets: 169 K/uL (ref 150–400)
RBC: 5.03 MIL/uL (ref 4.22–5.81)
RDW: 12.7 % (ref 11.5–15.5)
WBC: 2.4 K/uL — ABNORMAL LOW (ref 4.0–10.5)
nRBC: 0 % (ref 0.0–0.2)

## 2021-07-16 LAB — IRON AND TIBC
Iron: 53 ug/dL (ref 45–182)
Saturation Ratios: 15 % — ABNORMAL LOW (ref 17.9–39.5)
TIBC: 348 ug/dL (ref 250–450)
UIBC: 295 ug/dL

## 2021-07-16 LAB — VITAMIN B12: Vitamin B-12: 849 pg/mL (ref 180–914)

## 2021-07-16 LAB — FERRITIN: Ferritin: 42 ng/mL (ref 24–336)

## 2021-07-16 NOTE — Progress Notes (Signed)
[name]  Patient Care Team: Horald Pollen, MD as PCP - General (Internal Medicine) Rolan Bucco, MD as Attending Physician (Urology) Inda Castle, MD (Inactive) as Attending Physician (Gastroenterology) Tanda Rockers, MD as Consulting Physician (Pulmonary Disease) Knox Royalty, RN as Case Manager  CHIEF COMPLAINTS/PURPOSE OF CONSULTATION:  Leukopenia.  ASSESSMENT & PLAN:   This is a very pleasant 75 year old male patient with past medical history significant for prostate cancer several years ago status post surgery referred to hematology for worsening leukopenia.  Patient has been relatively asymptomatic except for unintentional weight loss.  He lost about 25 pounds in the past several months.  He otherwise denies any new health complaints, medications, known autoimmune diseases, nutritional deficiencies, recent infections or hospitalizations.  Physical examination healthy-appearing male patient, no major findings. Have reviewed his labs which showed progressive leukopenia over the past 2 to 3 years with no other hematological changes.  At this time given isolated leukopenia, recommended some lab evaluation.  If there is no etiology found on lab evaluation, we might proceed with bone marrow aspiration and biopsy versus surveillance if his weight loss stabilizes.  He is agreeable to all the recommendations. RTC in 2 weeks to review labs and to discuss recommendations.  Thank you for allowing [place] to care for your patient. Please do not hesitate to contact us with any additional questions or concerns.  HISTORY OF PRESENTING ILLNESS:   Omar [name]. 75 y.o. male is here because of leukopenia.  This is a very pleasant 75 year old male patient with past medical history significant for prostate cancer several years ago status post surgery, hypertension dyslipidemia referred to hematology for evaluation of persistent and progressive leukopenia.   Patient arrived to the appointment today with his wife.  He denies any complaints except for unintentional weight loss of 25 pounds over the past 3 to 4 months.  Wife says that he also has some underlying dementia and he was most recently started on donepezil for management of dementia.  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  Besides the weight loss and some fatigue he denies any new health complaints such as fevers, drenching night sweats, change in breathing, change in bowel habits or urinary habits.  He has chronic constipation and he does not remember his last PSA, likely was tested a few months or about a year ago.  He denies any known autoimmune diseases, recurrent infections or hospitalizations, known nutritional deficiencies. Rest of the pertinent 10 point ROS reviewed and negative.  REVIEW OF SYSTEMS:   Constitutional: Denies fevers, chills or abnormal night sweats Eyes: Denies blurriness of vision, double vision or watery eyes Ears, nose, mouth, throat, and face: Denies mucositis or sore throat Respiratory: Denies cough, dyspnea or wheezes Cardiovascular: Denies palpitation, chest discomfort or lower extremity swelling Gastrointestinal:  Denies nausea, heartburn or change in bowel habits Skin: Denies abnormal skin rashes Lymphatics: Denies new lymphadenopathy or easy bruising Neurological:Denies numbness, tingling or new weaknesses Behavioral/Psych: Mood is stable, no new changes  All other systems were reviewed with the patient and are negative.  MEDICAL HISTORY:  Past Medical History:  Diagnosis Date   Anxiety    BPH (benign prostatic hypertrophy)    Decreased white blood cell count    Depression    Diverticulosis    Erectile dysfunction    Gastric polyps    GERD (gastroesophageal reflux disease)    History of colonic polyps    History of esophageal  stricture 10/31/2007   History of kidney stones    History of prostate surgery    History of rheumatic fever     Hyperlipidemia    Hypertension    Prostate cancer ([place]) 11/09/2012   Prostatism     SURGICAL HISTORY: Past Surgical History:  Procedure Laterality Date   CARPAL TUNNEL RELEASE     Bilateral   ESOPHAGEAL DILATION     last dilation 8'12   INGUINAL HERNIA REPAIR     INSERTION OF MESH N/A 04/15/2013   Procedure: INSERTION OF MESH;  Surgeon: Odis Hollingshead, MD;  Location: [facility];  Service: General;  Laterality: N/A;   KNEE ARTHROSCOPY     left   LAPAROSCOPIC RIGHT HEMI COLECTOMY Right 08/31/2017   Procedure: LAPAROSCOPIC ASSISTED  RIGHT HEMI COLECTOMY;  Surgeon: Johnathan Hausen, MD;  Location: [facility];  Service: General;  Laterality: Right;   LEFT HEART CATHETERIZATION WITH CORONARY ANGIOGRAM N/A 07/18/2014   Procedure: LEFT HEART CATHETERIZATION WITH CORONARY ANGIOGRAM;  Surgeon: Burnell Blanks, MD;  Location: [facility] CATH [facility];  Service: Cardiovascular;  Laterality: N/A;   ROBOT ASSISTED LAPAROSCOPIC RADICAL PROSTATECTOMY  11/08/2012   Procedure: ROBOTIC ASSISTED LAPAROSCOPIC RADICAL PROSTATECTOMY LEVEL 1;  Surgeon: Molli Hazard, MD;  Location[facility];  Service: Urology;  Laterality: N/A;      TONSILLECTOMY     UMBILICAL HERNIA REPAIR N/A 04/15/2013   Procedure: HERNIA REPAIR UMBILICAL ADULT;  Surgeon: Odis Hollingshead, MD;  Location: [facility];  Service: General;  Laterality: N/A;    SOCIAL HISTORY: Social History   Socioeconomic History   Marital status: Married    Spouse name: Not on file   Number of children: 4   Years of education: Not on file   Highest education level: Not on file  Occupational History   Occupation: Theme park manager    Employer: [employer]  Tobacco Use   Smoking status: Former    Packs/day: 1.00    Years: 25.00    Pack years: 25.00    Types: Cigarettes    Quit date: 12/05/1993    Years since quitting: 27.6   Smokeless tobacco: Never  Vaping Use   Vaping Use: Never used  Substance and Sexual Activity   Alcohol use: No   Drug use: No    Sexual activity: Yes  Other Topics Concern   Not on file  Social History Narrative   Lives with wife in a one story home.  Has 4 children.  Works as a Theme park manager.     Education: 10th grade.    Caffeine use: soda (one per day)   Right handed   Social Determinants of Health   Financial Resource Strain: Low Risk    Difficulty of Paying Living Expenses: Not very hard  Food Insecurity: No Food Insecurity   Worried About Charity fundraiser in the Last Year: Never true   Ran Out of Food in the Last Year: Never true  Transportation Needs: No Transportation Needs   Lack of Transportation (Medical): No   Lack of Transportation (Non-Medical): No  Physical Activity: Sufficiently Active   Days of Exercise per Week: 7 days   Minutes of Exercise per Session: 40 min  Stress: No Stress Concern Present   Feeling of Stress : Not at all  Social Connections: Socially Integrated   Frequency of Communication with Friends and Family: More than three times a week   Frequency of Social Gatherings with Friends and Family: More than three times a  week   Attends Religious Services: More than 4 times per year   Active Member of Clubs or Organizations: Yes   Attends Music therapist: More than 4 times per year   Marital Status: Married  Human resources officer Violence: Not At Risk   Fear of Current or Ex-Partner: No   Emotionally Abused: No   Physically Abused: No   Sexually Abused: No    FAMILY HISTORY: Family History  Problem Relation Age of Onset   Ovarian cancer Mother    Colon cancer Mother 49   High Cholesterol Mother    Stroke Father    Lung cancer Maternal Uncle    Lung cancer Maternal Aunt    Esophageal cancer Neg Hx    Stomach cancer Neg Hx    Rectal cancer Neg Hx    Pancreatic cancer Neg Hx    Liver disease Neg Hx     ALLERGIES:  is allergic to sulfonamide derivatives.  MEDICATIONS:  Current Outpatient Medications  Medication Sig Dispense Refill   donepezil (ARICEPT) 10  MG tablet Take 1 tablet (10 mg total) by mouth at bedtime. 90 tablet 4   esomeprazole (NEXIUM) 40 MG capsule Take 1 capsule (40 mg total) by mouth daily. 30 min before  breakfast 30 capsule 3   Multiple Vitamin (MULTIVITAMIN) tablet Take 1 tablet by mouth daily.     Omega-3 Fatty Acids (FISH OIL) 1200 MG CAPS Take by mouth.     OVER THE COUNTER MEDICATION daily.     phentolamine (REGITINE) 5 MG injection once. Patient reports takes once q 2 weeks- prescribed by "Dr. Lynne Logan, cancer doctor"     psyllium (METAMUCIL) 58.6 % powder Take 1 packet by mouth daily.     rosuvastatin (CRESTOR) 20 MG tablet Take 1 tablet (20 mg total) by mouth daily. 90 tablet 3   Current Facility-Administered Medications  Medication Dose Route Frequency Provider Last Rate Last Admin   0.9 %  sodium chloride infusion  500 mL Intravenous Once Nandigam, Venia Minks, MD        PHYSICAL EXAMINATION:  ECOG PERFORMANCE STATUS: 0 - Asymptomatic  Vitals:   07/16/21 1056  BP: (!) 156/87  Pulse: 68  Resp: 16  Temp: 98.2 F (36.8 C)  SpO2: 100%   Filed Weights   07/16/21 1056  Weight: 177 lb 12.8 oz (80.6 kg)    GENERAL:alert, no distress and comfortable SKIN: skin color, texture, turgor are normal, no rashes or significant lesions EYES: normal, conjunctiva are pink and non-injected, sclera clear OROPHARYNX:no exudate, no erythema and lips, buccal mucosa, and tongue normal  NECK: supple, thyroid normal size, non-tender, without nodularity LYMPH:  no palpable lymphadenopathy in the cervical, axillary or inguinal LUNGS: clear to auscultation and percussion with normal breathing effort HEART: regular rate & rhythm and no murmurs and no lower extremity edema ABDOMEN:abdomen soft, non-tender and normal bowel sounds Musculoskeletal:no cyanosis of digits and no clubbing  PSYCH: alert & oriented x 3 with fluent speech NEURO: no focal motor/sensory deficits  LABORATORY DATA:  I have reviewed the data as  listed Lab Results  Component Value Date   WBC 2.6 (L) 06/17/2021   HGB 14.1 06/17/2021   HCT 41.5 06/17/2021   MCV 82.1 06/17/2021   PLT 180.0 06/17/2021     Chemistry      Component Value Date/Time   NA 140 06/17/2021 1422   NA 141 05/26/2020 1506   K 3.8 06/17/2021 1422   CL 103 06/17/2021 1422  CO2 31 06/17/2021 1422   BUN 13 06/17/2021 1422   BUN 16 05/26/2020 1506   CREATININE 1.05 06/17/2021 1422      Component Value Date/Time   CALCIUM 9.4 06/17/2021 1422   ALKPHOS 64 06/17/2021 1422   AST 16 06/17/2021 1422   ALT 14 06/17/2021 1422   BILITOT 1.0 06/17/2021 1422   BILITOT 0.5 05/26/2020 1506       RADIOGRAPHIC STUDIES: I have personally reviewed the radiological images as listed and agreed with the findings in the report. DG Chest 2 View  Result Date: 06/17/2021 CLINICAL DATA:  Weight loss of 10 pounds over 2 months. Hypertension. Ex-smoker. EXAM: CHEST - 2 VIEW COMPARISON:  07/17/2019 FINDINGS: Mild hyperinflation. Mild right hemidiaphragm eventration anteriorly. Midline trachea. Normal heart size. Atherosclerosis in the transverse aorta. No pleural effusion or pneumothorax. Clear lungs. IMPRESSION: No acute cardiopulmonary disease. Electronically Signed   By: Abigail Miyamoto M.D.   On: 06/17/2021 14:57    All questions were answered. The patient knows to call the clinic with any problems, questions or concerns. I spent 45 minutes in the care of this patient including H and P, review of records, counseling and coordination of care.     Benay Pike, MD 07/16/2021 11:13 AM

## 2021-07-17 LAB — PSA, TOTAL AND FREE
PSA, Free Pct: UNDETERMINED %
PSA, Free: <0.01 ng/mL
Prostate Specific Ag, Serum: <0.1 ng/mL (ref 0.0–4.0)

## 2021-07-18 LAB — FOLATE RBC
Folate, Hemolysate: 502 ng/mL
Folate, RBC: 1184 ng/mL (ref >498)
Hematocrit: 42.4 % (ref 37.5–51.0)

## 2021-07-19 LAB — PATHOLOGIST SMEAR REVIEW

## 2021-07-26 LAB — ANTINUCLEAR ANTIBODIES, IFA: ANA Ab, IFA: NEGATIVE

## 2021-08-04 NOTE — Progress Notes (Signed)
[facility] [place], [place] [address]   CLINIC:  Medical Oncology/Hematology  PCP:  Horald Pollen, MD [address] / [place] [address]  [phone]  REASON FOR VISIT:  Follow-up for leukopenia  PRIOR THERAPY: none  CURRENT THERAPY: surveillance  INTERVAL HISTORY:  [name]., a 75 y.o. 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 Calil was last seen on 07/16/2021 by Dr. Benay Pike.  Today he reports feeling well. He reports losing 30 lbs over 6 months. His appetite has been slightly reduced. He has a history of prostate cancer diagnosed about 10 years ago treated with prostatectomy in 2013. He is able to do his typical home activities unassisted. He quit smoking 20 years ago. His mother had colon cancer, his maternal uncle had unspecified cancer, and his daughter had breast cancer.    REVIEW OF SYSTEMS:  Review of Systems  Constitutional:  Positive for appetite change and unexpected weight change (-30 lbs). Negative for fatigue (100%).  Gastrointestinal:  Positive for constipation.  All other systems reviewed and are negative.  PAST MEDICAL/SURGICAL HISTORY:  Past Medical History:  Diagnosis Date   Anxiety    BPH (benign prostatic hypertrophy)    Decreased white blood cell count    Depression    Diverticulosis    Erectile dysfunction    Gastric polyps    GERD (gastroesophageal reflux disease)    History of colonic polyps    History of esophageal stricture 10/31/2007   History of kidney stones    History of prostate surgery    History of rheumatic fever    Hyperlipidemia    Hypertension    Prostate cancer ([facility]) 11/09/2012   Prostatism    Past Surgical History:  Procedure Laterality Date   CARPAL TUNNEL RELEASE     Bilateral   ESOPHAGEAL DILATION     last dilation 8'12   INGUINAL HERNIA REPAIR     INSERTION OF MESH N/A 04/15/2013   Procedure: INSERTION OF MESH;  Surgeon: Odis Hollingshead, MD;   Location: [facility];  Service: General;  Laterality: N/A;   KNEE ARTHROSCOPY     left   LAPAROSCOPIC RIGHT HEMI COLECTOMY Right 08/31/2017   Procedure: LAPAROSCOPIC ASSISTED  RIGHT HEMI COLECTOMY;  Surgeon: Johnathan Hausen, MD;  [facility];  Service: General;  Laterality: Right;   LEFT HEART CATHETERIZATION WITH CORONARY ANGIOGRAM N/A 07/18/2014   Procedure: LEFT HEART CATHETERIZATION WITH CORONARY ANGIOGRAM;  Surgeon: Burnell Blanks, MD;  Location: [facility] C[facility] LAB;  Service: Cardiovascular;  Laterality: N/A;   ROBOT ASSISTED LAPAROSCOPIC RADICAL PROSTATECTOMY  11/08/2012   Procedure: ROBOTIC ASSISTED LAPAROSCOPIC RADICAL PROSTATECTOMY LEVEL 1;  Surgeon: Molli Hazard, MD;  [facility];  Service: Urology;  Laterality: N/A;      TONSILLECTOMY     UMBILICAL HERNIA REPAIR N/A 04/15/2013   Procedure: HERNIA REPAIR UMBILICAL ADULT;  Surgeon: Odis Hollingshead, MD;  Location: [facility];  Service: General;  Laterality: N/A;    SOCIAL HISTORY:  Social History   Socioeconomic History   Marital status: Married    Spouse name: Not on file   Number of children: 4   Years of education: Not on file   Highest education level: Not on file  Occupational History   Occupation: Theme park manager    Employer: [employer]  Tobacco Use   Smoking status: Former    Packs/day: 1.00    Years: 25.00    Pack  years: 25.00    Types: Cigarettes    Quit date: 12/05/1993    Years since quitting: 27.6   Smokeless tobacco: Never  Vaping Use   Vaping Use: Never used  Substance and Sexual Activity   Alcohol use: No   Drug use: No   Sexual activity: Yes  Other Topics Concern   Not on file  Social History Narrative   Lives with wife in a one story home.  Has 4 children.  Works as a Theme park manager.     Education: 10th grade.    Caffeine use: soda (one per day)   Right handed   Social Determinants of Health   Financial Resource Strain: Low Risk    Difficulty of Paying Living Expenses: Not very hard   Food Insecurity: No Food Insecurity   Worried About Charity fundraiser in the Last Year: Never true   Ran Out of Food in the Last Year: Never true  Transportation Needs: No Transportation Needs   Lack of Transportation (Medical): No   Lack of Transportation (Non-Medical): No  Physical Activity: Sufficiently Active   Days of Exercise per Week: 7 days   Minutes of Exercise per Session: 40 min  Stress: No Stress Concern Present   Feeling of Stress : Not at all  Social Connections: Socially Integrated   Frequency of Communication with Friends and Family: More than three times a week   Frequency of Social Gatherings with Friends and Family: More than three times a week   Attends Religious Services: More than 4 times per year   Active Member of [employer] or Organizations: Yes   Attends Music therapist: More than 4 times per year   Marital Status: Married  Human resources officer Violence: Not At Risk   Fear of Current or Ex-Partner: No   Emotionally Abused: No   Physically Abused: No   Sexually Abused: No    FAMILY HISTORY:  Family History  Problem Relation Age of Onset   Ovarian cancer Mother    Colon cancer Mother 30   High Cholesterol Mother    Stroke Father    Lung cancer Maternal Uncle    Lung cancer Maternal Aunt    Esophageal cancer Neg Hx    Stomach cancer Neg Hx    Rectal cancer Neg Hx    Pancreatic cancer Neg Hx    Liver disease Neg Hx     CURRENT MEDICATIONS:  Current Outpatient Medications  Medication Sig Dispense Refill   donepezil (ARICEPT) 10 MG tablet Take 1 tablet (10 mg total) by mouth at bedtime. 90 tablet 4   OVER THE COUNTER MEDICATION daily.     psyllium (METAMUCIL) 58.6 % powder Take 1 packet by mouth daily.     rosuvastatin (CRESTOR) 20 MG tablet Take 1 tablet (20 mg total) by mouth daily. 90 tablet 3   Current Facility-Administered Medications  Medication Dose Route Frequency Provider Last Rate Last Admin   0.9 %  sodium chloride infusion   500 mL Intravenous Once Nandigam, Kavitha V, MD        ALLERGIES:  Allergies  Allergen Reactions   Sulfonamide Derivatives Other (See Comments)     blisters on skin    PHYSICAL EXAM:  Performance status (ECOG): 0 - Asymptomatic  Vitals:   08/05/21 1556  BP: (!) 156/94  Pulse: 60  Resp: 16  Temp: 97.8 F (36.6 C)  SpO2: 100%   Wt Readings from Last 3 Encounters:  08/05/21 175 lb 11.2 oz (79.7  kg)  07/16/21 177 lb 12.8 oz (80.6 kg)  06/17/21 178 lb (80.7 kg)   Physical Exam Vitals reviewed.  Constitutional:      Appearance: Normal appearance.  Cardiovascular:     Rate and Rhythm: Normal rate and regular rhythm.     Pulses: Normal pulses.     Heart sounds: Normal heart sounds.  Pulmonary:     Effort: Pulmonary effort is normal.     Breath sounds: Normal breath sounds.  Abdominal:     Palpations: Abdomen is soft. There is no hepatomegaly, splenomegaly or mass.     Tenderness: There is no abdominal tenderness.  Musculoskeletal:     Right lower leg: No edema.     Left lower leg: No edema.  Neurological:     General: No focal deficit present.     Mental Status: He is alert and oriented to person, place, and time.  Psychiatric:        Mood and Affect: Mood normal.        Behavior: Behavior normal.    LABORATORY DATA:  I have reviewed the labs as listed.  CBC Latest Ref Rng & Units 07/16/2021 07/16/2021 06/17/2021  WBC 4.0 - 10.5 K/uL 2.4(L) - 2.6(L)  Hemoglobin 13.0 - 17.0 g/dL 14.1 - 14.1  Hematocrit 37.5 - 51.0 % 43.6 42.4 41.5  Platelets 150 - 400 K/uL 169 - 180.0   CMP Latest Ref Rng & Units 06/17/2021 09/08/2020 05/26/2020  Glucose 70 - 99 mg/dL 81 - 82  BUN 6 - 23 mg/dL 13 - 16  Creatinine 0.40 - 1.50 mg/dL 1.05 - 1.00  Sodium 135 - 145 mEq/L 140 - 141  Potassium 3.5 - 5.1 mEq/L 3.8 - 4.4  Chloride 96 - 112 mEq/L 103 - 103  CO2 19 - 32 mEq/L 31 - 26  Calcium 8.4 - 10.5 mg/dL 9.4 - 8.9  Total Protein 6.0 - 8.3 g/dL 6.5 6.4 6.3  Total Bilirubin 0.2 - 1.2  mg/dL 1.0 - 0.5  Alkaline Phos 39 - 117 U/L 64 - 74  AST 0 - 37 U/L 16 - 18  ALT 0 - 53 U/L 14 - 14      Component Value Date/Time   RBC 5.03 07/16/2021 1158   MCV 86.7 07/16/2021 1158   MCV 83 05/26/2020 1506   MCH 28.0 07/16/2021 1158   MCHC 32.3 07/16/2021 1158   RDW 12.7 07/16/2021 1158   RDW 13.0 05/26/2020 1506   LYMPHSABS 0.8 07/16/2021 1158   LYMPHSABS 1.1 05/26/2020 1506   MONOABS 0.2 07/16/2021 1158   EOSABS 0.2 07/16/2021 1158   EOSABS 0.2 05/26/2020 1506   BASOSABS 0.0 07/16/2021 1158   BASOSABS 0.1 05/26/2020 1506    DIAGNOSTIC IMAGING:  I have independently reviewed the scans and discussed with the patient. No results found.   ASSESSMENT:  1.  Leukopenia and neutropenia: - He was evaluated for leukopenia and CBC on 06/17/2021 showed white count 2.6 with ANC of 1.2. 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 - Work-up on 07/16/2021 showed white count 2.4 with ANC of 1.1.  ANA was negative.  123456 and folic acid were normal.  SPEP, immunofixation were negative on 09/08/2020.  2.  Social/family history: - He worked at [employer].  He also worked as a Theme park manager and currently retired. - He does all his ADLs and IADLs and drives. - Quit smoking more than 20 years ago. - Mother had colon cancer.  Brother had  cancer.  Daughter had breast cancer.  Maternal uncle also had cancer, patient does not know type.  3.  Prostate cancer: - He underwent prostatectomy on 11/08/2012. - Pathology showed Gleason score 3+4= 7, PT2CNX. - Last PSA on 07/16/2021 was undetectable.  PLAN:  1.  Leukopenia and neutropenia: - We have reviewed results of his testing which was negative for nutritional deficiencies and connective tissue disorders.  Plasma cell disorder was also ruled out. - He does not have any recurrent infections.  Differential diagnosis includes benign ethnic neutropenia.  We will check an LDH level. - ANC has varied from 1.0-1.8 over last several  years. - If there is any worsening we will consider bone marrow aspiration and biopsy.  2.  Weight loss: - He has unexplained weight loss of 25 to 30 pounds in the last 6 months. - He is also eating less.  Wife reports that he has mild dementia. - I have recommended CT CAP with contrast for further work-up of unexplained weight loss. - RTC after the scans.  Orders placed this encounter:  Orders Placed This Encounter  Procedures   CT CHEST ABDOMEN PELVIS W CONTRAST   Basic metabolic panel   Lactate dehydrogenase     Derek Jack, MD [place] [phone]   I, Thana Ates, am acting as a scribe for Dr. Derek Jack.  I, Derek Jack MD, have reviewed the above documentation for accuracy and completeness, and I agree with the above.

## 2021-08-05 ENCOUNTER — Inpatient Hospital Stay (HOSPITAL_COMMUNITY): Payer: Medicare Other | Attending: Hematology and Oncology | Admitting: Hematology

## 2021-08-05 ENCOUNTER — Other Ambulatory Visit: Payer: Self-pay

## 2021-08-05 VITALS — BP 156/94 | HR 60 | Temp 97.8°F | Resp 16 | Wt 175.7 lb

## 2021-08-05 DIAGNOSIS — I10 Essential (primary) hypertension: Secondary | ICD-10-CM | POA: Insufficient documentation

## 2021-08-05 DIAGNOSIS — D72819 Decreased white blood cell count, unspecified: Secondary | ICD-10-CM | POA: Diagnosis not present

## 2021-08-05 DIAGNOSIS — Z808 Family history of malignant neoplasm of other organs or systems: Secondary | ICD-10-CM | POA: Diagnosis not present

## 2021-08-05 DIAGNOSIS — Z803 Family history of malignant neoplasm of breast: Secondary | ICD-10-CM | POA: Diagnosis not present

## 2021-08-05 DIAGNOSIS — D709 Neutropenia, unspecified: Secondary | ICD-10-CM | POA: Diagnosis not present

## 2021-08-05 DIAGNOSIS — Z8546 Personal history of malignant neoplasm of prostate: Secondary | ICD-10-CM | POA: Diagnosis not present

## 2021-08-05 DIAGNOSIS — R634 Abnormal weight loss: Secondary | ICD-10-CM | POA: Diagnosis not present

## 2021-08-05 DIAGNOSIS — Z87891 Personal history of nicotine dependence: Secondary | ICD-10-CM | POA: Insufficient documentation

## 2021-08-05 DIAGNOSIS — C61 Malignant neoplasm of prostate: Secondary | ICD-10-CM | POA: Diagnosis not present

## 2021-08-05 NOTE — Patient Instructions (Addendum)
[place] at [facility] Discharge Instructions  You were seen today by Dr. Delton Coombes. He went over your recent results and scans. You will be scheduled for a CT scan of your chest, abdomen, and pelvis prior to your next visit. Dr. Delton Coombes will see you back in after your scan for labs and follow up.   Thank you for choosing [place] at [facility] to provide your oncology and hematology care.  To afford each patient quality time with our provider, please arrive at least 15 minutes before your scheduled appointment time.   If you have a lab appointment with the [facility] please come in thru the Main Entrance and check in at the main information desk  You need to re-schedule your appointment should you arrive 10 or more minutes late.  We strive to give you quality time with our providers, and arriving late affects you and other patients whose appointments are after yours.  Also, if you no show three or more times for appointments you may be dismissed from the clinic at the providers discretion.     Again, thank you for choosing [facility]  Our hope is that these requests will decrease the amount of time that you wait before being seen by our physicians.       _____________________________________________________________  Should you have questions after your visit to [facility], please contact our office at [phone] between the hours of 8:00 a.m. and 4:30 p.m.  Voicemails left after 4:00 p.m. will not be returned until the following business day.  For prescription refill requests, have your pharmacy contact our office and allow 72 hours.    Cancer Center Support Programs:   > Cancer Support Group  2nd Tuesday of the month 1pm-2pm, Journey Room

## 2021-08-10 ENCOUNTER — Ambulatory Visit (INDEPENDENT_AMBULATORY_CARE_PROVIDER_SITE_OTHER): Payer: Medicare Other | Admitting: *Deleted

## 2021-08-10 DIAGNOSIS — F0390 Unspecified dementia without behavioral disturbance: Secondary | ICD-10-CM

## 2021-08-10 DIAGNOSIS — E785 Hyperlipidemia, unspecified: Secondary | ICD-10-CM

## 2021-08-10 DIAGNOSIS — D72819 Decreased white blood cell count, unspecified: Secondary | ICD-10-CM

## 2021-08-10 NOTE — Chronic Care Management (AMB) (Signed)
Chronic Care Management   CCM RN Visit Note  08/10/2021 Name: Omar [name]. MRN: [id number] DOB: 1946-01-02  Subjective: Omar [name]. is a 75 y.o. year old male who is a primary care patient of Mitchel Honour, Ines Bloomer, MD. The care management team was consulted for assistance with disease management and care coordination needs.    Engaged with patient and spouse- caregiver Vaughan Basta, on [place] by telephone for follow up visit in response to provider referral for case management and/or care coordination services.   Consent to Services:  The patient was given information about Chronic Care Management services, agreed to services, and gave verbal consent prior to initiation of services.  Please see initial visit note for detailed documentation.  Patient agreed to services and verbal consent obtained.   Assessment: Review of patient past medical history, allergies, medications, health status, including review of consultants reports, laboratory and other test data, was performed as part of comprehensive evaluation and provision of chronic care management services.   CCM Care Plan Allergies  Allergen Reactions   Sulfonamide Derivatives Other (See Comments)     blisters on skin   Outpatient Encounter Medications as of 08/10/2021  Medication Sig Note   donepezil (ARICEPT) 10 MG tablet Take 1 tablet (10 mg total) by mouth at bedtime.    OVER THE COUNTER MEDICATION daily. 01/21/2020: As needed Premier protein   psyllium (METAMUCIL) 58.6 % powder Take 1 packet by mouth daily.    rosuvastatin (CRESTOR) 20 MG tablet Take 1 tablet (20 mg total) by mouth daily.    [DISCONTINUED] vardenafil (LEVITRA) 20 MG tablet Take 20 mg by mouth as needed.      Facility-Administered Encounter Medications as of 08/10/2021  Medication   0.9 %  sodium chloride infusion   Patient Active Problem List   Diagnosis Date Noted   History of colon cancer 06/17/2021   Lipoma of anterior chest wall 04/29/2021   Snoring  01/13/2021   Dementia without behavioral disturbance ([place]) 01/13/2021   Excessive daytime sleepiness 01/13/2021   Memory loss 09/08/2020   Numbness 09/08/2020   Gait disturbance 09/08/2020   Diverticulosis 04/04/2019   Internal hemorrhoids 04/04/2019   Loss of weight 01/01/2019   Dysplastic mass of the cecum s/p lap right colectomy 08/31/2017 09/02/2017   S/P right colectomy 08/31/2017   Polyneuropathy 11/21/2016   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   History of pulmonary embolism 07/18/2014   Adrenal adenoma 03/18/2014   Coronary atherosclerosis of native coronary artery 03/18/2014   Hyperlipidemia 11/13/2013   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/2013   Prostate cancer ([place]) 11/09/2012   Stricture and stenosis of esophagus 05/31/2012   Esophageal reflux 10/09/2007   Dyslipidemia 07/16/2007   Depression 07/16/2007   COLONIC POLYPS, HX OF 07/16/2007   Conditions to be addressed/monitored:  HLD, Dementia, and chronic leukopenia  Care Plan : RN Care Manager Plan of Care  Updates made by Knox Royalty, RN since 08/10/2021 12:00 AM     Problem: Chronic Disease management Needs   Priority: Medium     [place] Goal: Development of plan of care for [place] term chronic disease management   Start Date: 07/09/2021  Expected End Date: 07/09/2022  Priority: Medium  Note:   Current Barriers:  Chronic Disease Management support and education needs related to HLD and memory loss/ dementia Recent weight loss of ~35 pounds in 4 months, per patient report  RNCM Clinical Goal(s):  Patient will demonstrate ongoing adherence to prescribed treatment plan  for HLD and memory loss as evidenced by adherence to prescribed medication regimen contacting provider for new or worsened symptoms or questions attending all provider appointments, ongoing adherence to diet low in cholesterol  through collaboration with RN Care manager, provider, and care team.   Interventions: 1:1  collaboration with primary care provider regarding development and update of comprehensive plan of care as evidenced by provider attestation and co-signature Inter-disciplinary care team collaboration (see longitudinal plan of care) Evaluation of current treatment plan related to  self management and patient's adherence to plan as established by provider;  Hyperlipidemia:  (Status: Goal on track: YES.) Lab Results  Component Value Date   CHOL 172 09/13/2017   HDL 46.40 09/13/2017   [place] 105 (H) 09/13/2017   LDLDIRECT 114.0 08/16/2016   TRIG 104.0 09/13/2017   CHOLHDL 4 09/13/2017    Counseled on importance of regular laboratory monitoring as prescribed; Reviewed role and benefits of statin for ASCVD risk reduction; Reviewed importance of limiting foods high in cholesterol; Discussed with patient and caregiver benefits of patient maintaining regular activity around weather conditions- patient reports regular active lifestyle at baseline Reviewed with patient/ spouse new patient appointment with hematology provider 07/16/21 and 08/05/21: confirmed patient attended both as scheduled and has plans to attend upcoming scheduled CT/ labs for ongoing purposes of definitive diagnosis as scheduled Reviewed upcoming provider appointments with patient/ caregiver: 09/07/21: CT scan/ oncology- hematology labs; 09/09/21- hematology/ oncology provider office visit; 11/01/21- PCP Confirmed patient appetite remains "real good;" he denies further weight loss, states "I haven't lost any more weight, but I have not gained any either;" confirms he continues trying to follow low salt, low cholesterol, heart healthy diet Confirmed patient/ spouse received and have reviewed/ are reviewing previously mailed educational material re: HLD diet  Memory Loss  (Status: Goal on track: YES.) Evaluation of current treatment plan related to  memory loss/ dementia ,  self-management and patient's adherence to plan as established  by provider Discussed plans with patient for ongoing care management follow up and provided patient with direct contact information for care management team Discussed plans with patient for ongoing care management follow up and provided patient with direct contact information for care management team; Discussed memory issues with patient and spouse- they deny changes; spouse reports she continues to remind patient of things he forgets, assists with medication administration, confirms spouse has added app to her phone to ensure she is aware of patient's whereabouts "all the time;" she denies that patient has unsafe behavior at home: does not wander off, does not attempt to drive without someone knowing where is going; patient and spouse report no signs/ symptyoms of progressive memory changes since our last outreach Confirmed no concerns around current medications Discussed caregiver resources/ support with spouse- she declines need for assistance/ resources today, states she is "handling caregiver responsibilities okay"  Patient Goals/Self-Care Activities: Patient will attend all scheduled provider appointments Patient will call pharmacy for medication refills Patient will call provider office for new concerns or questions Patient will continue to follow heart healthy, low salt, low cholesterol diet Patient will stay as active as possible around weather conditions      Plan: Telephone follow up appointment with care management team member scheduled for:  Monday, November 08, 2021 at 11:30 am The patient has been provided with contact information for the care management team and has been advised to call with any health related questions or concerns.   Oneta Rack, RN, BSN, [place]  [facility] [phone]: direct office [phone]: mobile

## 2021-08-10 NOTE — Patient Instructions (Signed)
Visit Information  Vaughan Basta and Josph "Delfino Lovett," it was nice talking with you today    I look forward to talking to you again for an update on Monday, November 08, 2021 at 11:30 am- please be listening out for my call that day.  I will call as close to 11:30 am as possible.   If you need to cancel or re-schedule our telephone visit, please call [phone] and one of our care guides will be happy to assist you.   I look forward to hearing about your progress.   Please don't hesitate to contact me if I can be of assistance to you before our next scheduled telephone appointment.   Oneta Rack, RN, BSN, [facility] RN Care Coordination- [place] [phone]: direct office [phone]: mobile    PATIENT GOALS:  Goals Addressed             This Visit's Progress    Patient self- Care Activities   On track    Timeframe:  [place] Goal Priority:  Medium Start Date:        07/09/21                     Expected End Date:    07/09/22                   Patient will attend all scheduled provider appointments Patient will call pharmacy for medication refills Patient will call provider office for new concerns or questions Patient will continue to follow heart healthy, low salt, low cholesterol diet Patient will stay as active as possible around weather conditions       Leukopenia Leukopenia is a condition in which the body has a low number of white blood cells. White blood cells help the body to fight infections. There are five types of white blood cells. Two types of cells, called lymphocytes andneutrophils, make up most of the white blood cell count. When lymphocytes are low, the condition is called lymphocytopenia. When neutrophils are low, the condition is called neutropenia. Neutropenia is the most dangerous type of leukopenia because it can lead to dangerous infections. Preventing infection is important if you have leukopenia. What are the  causes? This condition is commonly caused by damage to soft tissue inside of the bones (bone marrow), which is where most white blood cells are made. Bone marrow can get damaged by: Medicine or X-ray treatments for cancer (chemotherapy or radiation therapy). Serious infections. Cancer of the white blood cells. Medicines, including: Certain antibiotics. Certain heart medicines. Steroids. Certain medicines used to treat diseases of the immune system (autoimmune diseases), such as rheumatoid arthritis. Leukopenia also happens when white blood cells are destroyed after leaving the bone marrow, which may result from: Liver disease. Autoimmune disease. Vitamin B deficiencies. The number of white blood cells in the body varies from person to person. What are the signs or symptoms? In some cases, there are no symptoms. If you do have symptoms, one of the most common signs of leukopenia, especially severe neutropenia, is having a lot of bacterial infections. Different infections have different symptoms. An infection in your lungs may cause coughing. A urinary tract infection may cause frequent urination and a burning sensation. You may also get infections of the blood, skin, rectum, throat, sinuses, or ears. Other symptoms of this condition include: Fever, sweating, or chills. Fatigue. Swollen glands (lymph nodes). Painful mouth sores. Gum disease. How is this diagnosed? This condition may  be diagnosed based on your medical history, a physical exam, and tests. During the physical exam, your health care provider will check for swollen lymph nodes and an enlarged spleen. Your spleen is an organ on the left side of your body that stores white blood cells. You may also have tests, such as: A complete blood count. This blood test counts each type of white cell. Bone marrow aspiration. Some bone marrow is removed to be checked under a microscope. Lymph node biopsy. Some lymph node tissue is removed to  be checked under a microscope. Other types of blood tests or imaging tests. How is this treated? Treatment of leukopenia depends on the cause. Some common treatments include: Taking antibiotic medicine to treat bacterial infections. Stopping any medicines you are taking that may be causing leukopenia. Taking medicines to stimulate neutrophil production (hematopoietic growth factors), to treat neutropenia. Follow these instructions at home: Infection prevention Avoid close contact with sick people. Wash your hands often with soap and water. If soap and water are not available, use hand sanitizer. Do not eat uncooked or undercooked meats. Wash fruits and vegetables before eating them. Do not eat or drink unpasteurized dairy products. Get regular dental care, and maintain good dental hygiene. You should visit the dentist at least once every 6 months. Avoid fresh flowers or plants, which may have bacteria in the soil. General instructions Take over-the-counter and prescription medicines only as told by your health care provider. This includes supplements and vitamins. If you were prescribed an antibiotic medicine, take it as told by your health care provider. Do not stop taking the antibiotic even if you start to feel better. Avoid crowds. If you need to go to a public place where many people gather, try to visit at a time when it is likely to be less crowded. Keep all follow-up visits as told by your health care provider. This is important. Contact a health care provider if you have: A temperature greater than 101F (38.3C), sweating, or chills. Swollen lymph nodes. Fatigue. Painful mouth sores, or gum disease. Cough, stuffy nose (nasal congestion), or sore throat. Pain or burning when you urinate, a strong need to urinate, or the need to urinate more often. Get help right away if you have: A temperature greater than 101F (38.3C) that lasts for more than 2 days. Symptoms such as cough,  nasal congestion, sore throat, or urinary trouble that last for more than 2 days. Trouble breathing. Chest pain. These symptoms may represent a serious problem that is an emergency. Do not wait to see if the symptoms will go away. Get medical help right away. Call your local emergency services (911 in the [place] Do not drive yourself to the hospital. Summary Leukopenia is a condition in which the body has a low number of white blood cells. White blood cells help the body to fight infections. Treatment of leukopenia depends on the cause. Preventing infection is important if you have leukopenia. Follow instructions for preventing infection. Get help right away if you have a temperature greater than 101F (38.3C) that lasts for more than 2 days. This information is not intended to replace advice given to you by your health care provider. Make sure you discuss any questions you have with your health care provider. Document Revised: 02/03/2021 Document Reviewed: 10/01/2019 Elsevier Patient Education  2022 [employer].   The patient verbalized understanding of instructions, educational materials, and care plan provided today and agreed to receive a mailed copy of patient instructions, educational  materials, and care plan Telephone follow up appointment with care management team member scheduled for:  Monday, November 08, 2021 at 11:30 am The patient has been provided with contact information for the care management team and has been advised to call with any health related questions or concerns  Oneta Rack, RN, BSN, [place] [phone]: direct office [phone]: mobile

## 2021-08-20 ENCOUNTER — Telehealth: Payer: Self-pay | Admitting: Emergency Medicine

## 2021-08-20 NOTE — Telephone Encounter (Signed)
LVM for pt to rtn my call to schedule AWV with [facility]. Please schedule AWV if pt calls the office  

## 2021-09-03 DIAGNOSIS — E785 Hyperlipidemia, unspecified: Secondary | ICD-10-CM | POA: Diagnosis not present

## 2021-09-03 DIAGNOSIS — F0390 Unspecified dementia without behavioral disturbance: Secondary | ICD-10-CM

## 2021-09-07 ENCOUNTER — Inpatient Hospital Stay (HOSPITAL_COMMUNITY): Payer: Medicare Other | Attending: Hematology

## 2021-09-07 ENCOUNTER — Ambulatory Visit (HOSPITAL_COMMUNITY)
Admission: RE | Admit: 2021-09-07 | Discharge: 2021-09-07 | Disposition: A | Discharge status: Home / Self Care | Payer: Medicare Other | Source: Ambulatory Visit | Attending: Hematology | Admitting: Hematology

## 2021-09-07 ENCOUNTER — Other Ambulatory Visit: Payer: Self-pay

## 2021-09-07 DIAGNOSIS — Z79899 Other long term (current) drug therapy: Secondary | ICD-10-CM | POA: Insufficient documentation

## 2021-09-07 DIAGNOSIS — D72819 Decreased white blood cell count, unspecified: Secondary | ICD-10-CM | POA: Insufficient documentation

## 2021-09-07 DIAGNOSIS — Z8546 Personal history of malignant neoplasm of prostate: Secondary | ICD-10-CM | POA: Insufficient documentation

## 2021-09-07 DIAGNOSIS — R634 Abnormal weight loss: Secondary | ICD-10-CM | POA: Insufficient documentation

## 2021-09-07 DIAGNOSIS — Z87891 Personal history of nicotine dependence: Secondary | ICD-10-CM | POA: Insufficient documentation

## 2021-09-07 DIAGNOSIS — D709 Neutropenia, unspecified: Secondary | ICD-10-CM | POA: Insufficient documentation

## 2021-09-07 DIAGNOSIS — C61 Malignant neoplasm of prostate: Secondary | ICD-10-CM | POA: Insufficient documentation

## 2021-09-07 DIAGNOSIS — K769 Liver disease, unspecified: Secondary | ICD-10-CM | POA: Diagnosis not present

## 2021-09-07 DIAGNOSIS — D1803 Hemangioma of intra-abdominal structures: Secondary | ICD-10-CM | POA: Diagnosis not present

## 2021-09-07 DIAGNOSIS — Z800 Family history of malignant neoplasm of digestive organs: Secondary | ICD-10-CM | POA: Insufficient documentation

## 2021-09-07 LAB — POCT I-STAT CREATININE: Creatinine, Ser: 1.2 mg/dL (ref 0.61–1.24)

## 2021-09-07 MED ORDER — IOHEXOL 300 MG/ML  SOLN
100.0000 mL | Freq: Once | INTRAMUSCULAR | Status: AC | PRN
  Administered 2021-09-07: 100 mL via INTRAVENOUS

## 2021-09-08 NOTE — Progress Notes (Signed)
[place], [place] [address]   CLINIC:  Medical Oncology/Hematology  PCP:  Horald Pollen, MD [address] / [place] [address]  [phone]  REASON FOR VISIT:  Follow-up for leukopenia  PRIOR THERAPY: none  CURRENT THERAPY: surveillance  INTERVAL HISTORY:  [name]., a 75 y.o. 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 Trev was last seen on 08/05/2021.  Today he reports feeling good. His appetite is below his baseline.   REVIEW OF SYSTEMS:  Review of Systems  Constitutional:  Positive for appetite change. Negative for fatigue (60%).  Gastrointestinal:  Positive for constipation.  Neurological:  Positive for numbness.  Psychiatric/Behavioral:  Positive for depression.   All other systems reviewed and are negative.  PAST MEDICAL/SURGICAL HISTORY:  Past Medical History:  Diagnosis Date   Anxiety    BPH (benign prostatic hypertrophy)    Decreased white blood cell count    Depression    Diverticulosis    Erectile dysfunction    Gastric polyps    GERD (gastroesophageal reflux disease)    History of colonic polyps    History of esophageal stricture 10/31/2007   History of kidney stones    History of prostate surgery    History of rheumatic fever    Hyperlipidemia    Hypertension    Prostate cancer ([place]) 11/09/2012   Prostatism    Past Surgical History:  Procedure Laterality Date   CARPAL TUNNEL RELEASE     Bilateral   ESOPHAGEAL DILATION     last dilation 8'12   INGUINAL HERNIA REPAIR     INSERTION OF MESH N/A 04/15/2013   Procedure: INSERTION OF MESH;  Surgeon: Odis Hollingshead, MD;  Location: [facility];  Service: General;  Laterality: N/A;   KNEE ARTHROSCOPY     left   LAPAROSCOPIC RIGHT HEMI COLECTOMY Right 08/31/2017   Procedure: LAPAROSCOPIC ASSISTED  RIGHT HEMI COLECTOMY;  Surgeon: Johnathan Hausen, MD;  Location: [facility];  Service: General;  Laterality: Right;   LEFT HEART  CATHETERIZATION WITH CORONARY ANGIOGRAM N/A 07/18/2014   Procedure: LEFT HEART CATHETERIZATION WITH CORONARY ANGIOGRAM;  Surgeon: Burnell Blanks, MD;  Location: [facility] CATH LAB;  Service: Cardiovascular;  Laterality: N/A;   ROBOT ASSISTED LAPAROSCOPIC RADICAL PROSTATECTOMY  11/08/2012   Procedure: ROBOTIC ASSISTED LAPAROSCOPIC RADICAL PROSTATECTOMY LEVEL 1;  Surgeon: Molli Hazard, MD;  Location: [facility];  Service: Urology;  Laterality: N/A;      TONSILLECTOMY     UMBILICAL HERNIA REPAIR N/A 04/15/2013   Procedure: HERNIA REPAIR UMBILICAL ADULT;  Surgeon: Odis Hollings[place], MD;  Location: [facility];  Service: General;  Laterality: N/A;    SOCIAL HISTORY:  Social History   Socioeconomic History   Marital status: Married    Spouse name: Not on file   Number of children: 4   Years of education: Not on file   Highest education level: Not on file  Occupational History   Occupation: Theme park manager    Employer: [employer]  Tobacco Use   Smoking status: Former    Packs/day: 1.00    Years: 25.00    Pack years: 25.00    Types: Cigarettes    Quit date: 12/05/1993    Years since quitting: 27.7   Smokeless tobacco: Never  Vaping Use   Vaping Use: Never used  Substance and Sexual Activity   Alcohol use: No   Drug use: No   Sexual activity: Yes  Other  Topics Concern   Not on file  Social History Narrative   Lives with wife in a one story home.  Has 4 children.  Works as a Theme park manager.     Education: 10th grade.    Caffeine use: soda (one per day)   Right handed   Social Determinants of Health   Financial Resource Strain: Low Risk    Difficulty of Paying Living Expenses: Not very hard  Food Insecurity: No Food Insecurity   Worried About Charity fundraiser in the Last Year: Never true   Ran Out of Food in the Last Year: Never true  Transportation Needs: No Transportation Needs   Lack of Transportation (Medical): No   Lack of Transportation (Non-Medical): No  Physical  Activity: Sufficiently Active   Days of Exercise per Week: 7 days   Minutes of Exercise per Session: 40 min  Stress: No Stress Concern Present   Feeling of Stress : Not at all  Social Connections: Socially Integrated   Frequency of Communication with Friends and Family: More than three times a week   Frequency of Social Gatherings with Friends and Family: More than three times a week   Attends Religious Services: More than 4 times per year   Active Member of [employer] or Organizations: Yes   Attends Music therapist: More than 4 times per year   Marital Status: Married  Human resources officer Violence: Not At Risk   Fear of Current or Ex-Partner: No   Emotionally Abused: No   Physically Abused: No   Sexually Abused: No    FAMILY HISTORY:  Family History  Problem Relation Age of Onset   Ovarian cancer Mother    Colon cancer Mother 49   High Cholesterol Mother    Stroke Father    Lung cancer Maternal Uncle    Lung cancer Maternal Aunt    Esophageal cancer Neg Hx    Stomach cancer Neg Hx    Rectal cancer Neg Hx    Pancreatic cancer Neg Hx    Liver disease Neg Hx     CURRENT MEDICATIONS:  Current Outpatient Medications  Medication Sig Dispense Refill   donepezil (ARICEPT) 10 MG tablet Take 1 tablet (10 mg total) by mouth at bedtime. 90 tablet 4   OVER THE COUNTER MEDICATION daily.     psyllium (METAMUCIL) 58.6 % powder Take 1 packet by mouth daily.     rosuvastatin (CRESTOR) 20 MG tablet Take 1 tablet (20 mg total) by mouth daily. 90 tablet 3   Current Facility-Administered Medications  Medication Dose Route Frequency Provider Last Rate Last Admin   0.9 %  sodium chloride infusion  500 mL Intravenous Once Nandigam, Kavitha V, MD        ALLERGIES:  Allergies  Allergen Reactions   Sulfonamide Derivatives Other (See Comments)     blisters on skin    PHYSICAL EXAM:  Performance status (ECOG): 0 - Asymptomatic  There were no vitals filed for this visit. Wt  Readings from Last 3 Encounters:  08/05/21 175 lb 11.2 oz (79.7 kg)  07/16/21 177 lb 12.8 oz (80.6 kg)  06/17/21 178 lb (80.7 kg)   Physical Exam  LABORATORY DATA:  I have reviewed the labs as listed.  CBC Latest Ref Rng & Units 07/16/2021 07/16/2021 06/17/2021  WBC 4.0 - 10.5 K/uL 2.4(L) - 2.6(L)  Hemoglobin 13.0 - 17.0 g/dL 14.1 - 14.1  Hematocrit 37.5 - 51.0 % 43.6 42.4 41.5  Platelets 150 - 400 K/uL 169 -  180.0   CMP Latest Ref Rng & Units 09/07/2021 06/17/2021 09/08/2020  Glucose 70 - 99 mg/dL - 81 -  BUN 6 - 23 mg/dL - 13 -  Creatinine 0.61 - 1.24 mg/dL 1.20 1.05 -  Sodium 135 - 145 mEq/L - 140 -  Potassium 3.5 - 5.1 mEq/L - 3.8 -  Chloride 96 - 112 mEq/L - 103 -  CO2 19 - 32 mEq/L - 31 -  Calcium 8.4 - 10.5 mg/dL - 9.4 -  Total Protein 6.0 - 8.3 g/dL - 6.5 6.4  Total Bilirubin 0.2 - 1.2 mg/dL - 1.0 -  Alkaline Phos 39 - 117 U/L - 64 -  AST 0 - 37 U/L - 16 -  ALT 0 - 53 U/L - 14 -      Component Value Date/Time   RBC 5.03 07/16/2021 1158   MCV 86.7 07/16/2021 1158   MCV 83 05/26/2020 1506   MCH 28.0 07/16/2021 1158   MCHC 32.3 07/16/2021 1158   RDW 12.7 07/16/2021 1158   RDW 13.0 05/26/2020 1506   LYMPHSABS 0.8 07/16/2021 1158   LYMPHSABS 1.1 05/26/2020 1506   MONOABS 0.2 07/16/2021 1158   EOSABS 0.2 07/16/2021 1158   EOSABS 0.2 05/26/2020 1506   BASOSABS 0.0 07/16/2021 1158   BASOSABS 0.1 05/26/2020 1506    DIAGNOSTIC IMAGING:  I have independently reviewed the scans and discussed with the patient. CT CHEST ABDOMEN PELVIS W CONTRAST  Result Date: 09/08/2021 CLINICAL DATA:  Prostate cancer.  Surveillance. EXAM: CT CHEST, ABDOMEN, AND PELVIS WITH CONTRAST TECHNIQUE: Multidetector CT imaging of the chest, abdomen and pelvis was performed following the standard protocol during bolus administration of intravenous contrast. CONTRAST:  179mL OMNIPAQUE IOHEXOL 300 MG/ML  SOLN COMPARISON:  None. FINDINGS: CT CHEST FINDINGS Cardiovascular: No significant vascular findings.  Normal heart size. No pericardial effusion. Mediastinum/Nodes: No axillary or supraclavicular adenopathy. No mediastinal or hilar adenopathy. No pericardial fluid. Esophagus normal. Lungs/Pleura: No suspicious pulmonary nodules. Normal pleural. Airways normal. Musculoskeletal: No aggressive osseous lesion. CT ABDOMEN AND PELVIS FINDINGS Hepatobiliary: Peripherally enhancing lesion in the dome of the RIGHT hepatic lobe is most suggestive of benign hemangioma. Lesion similar to 09/16/2019. Pancreas: Pancreas is normal. No ductal dilatation. No pancreatic inflammation. Spleen: Normal spleen Adrenals/urinary tract: Bilateral enlargement of the adrenal glands not changed from comparison exam 2020. Kidneys, ureters and bladder normal. Stomach/Bowel: Stomach, small bowel, appendix, and cecum are normal. The colon and rectosigmoid colon are normal. Vascular/Lymphatic: Abdominal aorta is normal caliber with atherosclerotic calcification. There is no retroperitoneal or periportal lymphadenopathy. No pelvic lymphadenopathy. Reproductive: Post hysterectomy.  Adnexa unremarkable Other: No peritoneal disease Musculoskeletal: No aggressive osseous lesion. IMPRESSION: Chest Impression: No thoracic metastasis Abdomen / Pelvis Impression: 1. No evidence of pelvic lymphadenopathy or abdominal lymphadenopathy. 2. No pancreatic lesion identified. 3. No osseous metastasis 4. 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 Electronically Signed   By: Suzy Bouchard M.D.   On: 09/08/2021 13:45     ASSESSMENT:  1.  Leukopenia and neutropenia: - He was evaluated for leukopenia and CBC on 06/17/2021 showed white count 2.6 with ANC of 1.2. 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 - Work-up on 07/16/2021 showed white count 2.4 with ANC of 1.1.  ANA was negative.  D22 and folic acid were normal.  SPEP, immunofixation were negative on  09/08/2020.  2.  Social/family history: - He worked at [employer].  He also worked as a Theme park manager and currently retired. - He does all his ADLs and IADLs and drives. - Quit smoking more than 20 years ago. - Mother had colon cancer.  Brother had cancer.  Daughter had breast cancer.  Maternal uncle also had cancer, patient does not know type.  3.  Prostate cancer: - He underwent prostatectomy on 11/08/2012. - Pathology showed Gleason score 3+4= 7, PT2CNX. - Last PSA on 07/16/2021 was undetectable.   PLAN:  1.  Leukopenia and neutropenia: - Work-up for nutritional deficiencies, connective tissue disorders and plasma cell disorder was negative. - He had low white count with low ANC ranging between 1000-18 100 for few years with no recurrent infections.  Differential diagnosis includes benign ethnic neutropenia. - Recommend repeat blood counts in 4 months.  If there is any worsening, will consider bone marrow biopsy.  2.  Weight loss: - He has unexplained weight loss of 25 to 30 pounds in the past 6 months. - He is also eating less.  Wife thinks he has mild dementia. - We reviewed CT CAP done for weight loss from 09/07/2021 which did not show any evidence of adenopathy or suspicious lesions.  No bone mets.  Benign hemangioma in the right hepatic lobe. - We will start him on Marinol 5 mg twice daily for appetite stimulation. - RTC 4 months.  Orders placed this encounter:  No orders of the defined types were placed in this encounter.    Derek Jack, MD [place] [phone]   I, Thana Ates, am acting as a scribe for Dr. Derek Jack.  I, Derek Jack MD, have reviewed the above documentation for accuracy and completeness, and I agree with the above.

## 2021-09-09 ENCOUNTER — Other Ambulatory Visit: Payer: Self-pay

## 2021-09-09 ENCOUNTER — Inpatient Hospital Stay (HOSPITAL_BASED_OUTPATIENT_CLINIC_OR_DEPARTMENT_OTHER): Payer: Medicare Other | Admitting: Hematology

## 2021-09-09 VITALS — BP 143/83 | HR 70 | Temp 97.0°F | Resp 18 | Wt 175.6 lb

## 2021-09-09 DIAGNOSIS — Z79899 Other long term (current) drug therapy: Secondary | ICD-10-CM | POA: Diagnosis not present

## 2021-09-09 DIAGNOSIS — C61 Malignant neoplasm of prostate: Secondary | ICD-10-CM | POA: Diagnosis not present

## 2021-09-09 DIAGNOSIS — Z800 Family history of malignant neoplasm of digestive organs: Secondary | ICD-10-CM | POA: Diagnosis not present

## 2021-09-09 DIAGNOSIS — R634 Abnormal weight loss: Secondary | ICD-10-CM

## 2021-09-09 DIAGNOSIS — Z87891 Personal history of nicotine dependence: Secondary | ICD-10-CM | POA: Diagnosis not present

## 2021-09-09 DIAGNOSIS — D72819 Decreased white blood cell count, unspecified: Secondary | ICD-10-CM | POA: Diagnosis not present

## 2021-09-09 DIAGNOSIS — Z8546 Personal history of malignant neoplasm of prostate: Secondary | ICD-10-CM | POA: Diagnosis not present

## 2021-09-09 DIAGNOSIS — D709 Neutropenia, unspecified: Secondary | ICD-10-CM | POA: Diagnosis not present

## 2021-09-09 MED ORDER — DRONABINOL 5 MG PO CAPS: 5.0000 mg | ORAL_CAPSULE | Freq: Two times a day (BID) | ORAL | 3 refills | Status: DC

## 2021-09-09 NOTE — Patient Instructions (Addendum)
[place] at [facility] Discharge Instructions  You were seen and examined by Dr. Delton Coombes. Take Marinol as prescribed for appetite. Return as scheduled for labs and office visit.     Thank you for choosing [place] at [facility] to provide your oncology and hematology care.  To afford each patient quality time with our provider, please arrive at least 15 minutes before your scheduled appointment time.   If you have a lab appointment with the [facility] please come in thru the Main Entrance and check in at the main information desk.  You need to re-schedule your appointment should you arrive 10 or more minutes late.  We strive to give you quality time with our providers, and arriving late affects you and other patients whose appointments are after yours.  Also, if you no show three or more times for appointments you may be dismissed from the clinic at the providers discretion.     Again, thank you for choosing [facility].  Our hope is that these requests will decrease the amount of time that you wait before being seen by our physicians.       _____________________________________________________________  Should you have questions after your visit to [facility], please contact our office at [phone] and follow the prompts.  Our office hours are 8:00 a.m. and 4:30 p.m. Monday - Friday.  Please note that voicemails left after 4:00 p.m. may not be returned until the following business day.  We are closed weekends and major holidays.  You do have access to a nurse 24-7, just call the main number to the clinic [phone] and do not press any options, hold on the line and a nurse will answer the phone.    For prescription refill requests, have your pharmacy contact our office and allow 72 hours.    Due to Covid, you will need to wear a mask upon entering the hospital. If you do not have a mask, a mask will be  given to you at the Main Entrance upon arrival. For doctor visits, patients may have 1 support person age 75 or older with them. For treatment visits, patients can not have anyone with them due to social distancing guidelines and our immunocompromised population.

## 2021-09-09 NOTE — Addendum Note (Signed)
Addended by: Joie Bimler on: 09/09/2021 02:28 PM   Modules accepted: Orders

## 2021-09-10 DIAGNOSIS — Z23 Encounter for immunization: Secondary | ICD-10-CM | POA: Diagnosis not present

## 2021-09-13 ENCOUNTER — Ambulatory Visit (HOSPITAL_COMMUNITY): Payer: Medicare Other | Admitting: Hematology

## 2021-09-22 ENCOUNTER — Ambulatory Visit (INDEPENDENT_AMBULATORY_CARE_PROVIDER_SITE_OTHER): Payer: Medicare Other

## 2021-09-22 DIAGNOSIS — Z0000 Encounter for general adult medical examination without abnormal findings: Secondary | ICD-10-CM | POA: Diagnosis not present

## 2021-09-22 NOTE — Patient Instructions (Signed)
[name] , Thank you for taking time to come for your Medicare Wellness Visit. I appreciate your ongoing commitment to your health goals. Please review the following plan we discussed and let me know if I can assist you in the future.   Screening recommendations/referrals: Colonoscopy: 11/521/2019; due every 5 years Recommended yearly ophthalmology/optometry visit for glaucoma screening and checkup Recommended yearly dental visit for hygiene and checkup  Vaccinations: Influenza vaccine: 09/17/2021 Pneumococcal vaccine: 11/09/2012, 07/17/2014 Tdap vaccine: 05/27/2009; due every 10 years (overdue) Shingles vaccine: never done   Covid-19: 01/13/2020, 02/05/2020  Advanced directives: Please bring a copy of your health care power of attorney and living will to the office at your convenience.  Conditions/risks identified: Yes; Client understands the importance of follow-up with providers by attending scheduled visits and discussed goals to eat healthier, increase physical activity, exercise the brain, socialize more, get enough sleep and make time for laughter.  Next appointment: Please schedule your next Medicare Wellness Visit with your Nurse Health Advisor in 1 year by calling [phone].  Preventive Care 75 Years and Older, Male Preventive care refers to lifestyle choices and visits with your health care provider that can promote health and wellness. What does preventive care include? A yearly physical exam. This is also called an annual well check. Dental exams once or twice a year. Routine eye exams. Ask your health care provider how often you should have your eyes checked. Personal lifestyle choices, including: Daily care of your teeth and gums. Regular physical activity. Eating a healthy diet. Avoiding tobacco and drug use. Limiting alcohol use. Practicing safe sex. Taking low doses of aspirin every day. Taking vitamin and mineral supplements as recommended by your health care  provider. What happens during an annual well check? The services and screenings done by your health care provider during your annual well check will depend on your age, overall health, lifestyle risk factors, and family history of disease. Counseling  Your health care provider may ask you questions about your: Alcohol use. Tobacco use. Drug use. Emotional well-being. Home and relationship well-being. Sexual activity. Eating habits. History of falls. Memory and ability to understand (cognition). Work and work Statistician. Screening  You may have the following tests or measurements: Height, weight, and BMI. Blood pressure. Lipid and cholesterol levels. These may be checked every 5 years, or more frequently if you are over 1 years old. Skin check. Lung cancer screening. You may have this screening every year starting at age 16 if you have a 30-pack-year history of smoking and currently smoke or have quit within the past 15 years. Fecal occult blood test (FOBT) of the stool. You may have this test every year starting at age 27. Flexible sigmoidoscopy or colonoscopy. You may have a sigmoidoscopy every 5 years or a colonoscopy every 10 years starting at age 28. Prostate cancer screening. Recommendations will vary depending on your family history and other risks. Hepatitis C blood test. Hepatitis B blood test. Sexually transmitted disease (STD) testing. Diabetes screening. This is done by checking your blood sugar (glucose) after you have not eaten for a while (fasting). You may have this done every 1-3 years. Abdominal aortic aneurysm (AAA) screening. You may need this if you are a current or former smoker. Osteoporosis. You may be screened starting at age 92 if you are at high risk. Talk with your health care provider about your test results, treatment options, and if necessary, the need for more tests. Vaccines  Your health care provider may recommend  certain vaccines, such  as: Influenza vaccine. This is recommended every year. Tetanus, diphtheria, and acellular pertussis (Tdap, Td) vaccine. You may need a Td booster every 10 years. Zoster vaccine. You may need this after age 38. Pneumococcal 13-valent conjugate (PCV13) vaccine. One dose is recommended after age 91. Pneumococcal polysaccharide (PPSV23) vaccine. One dose is recommended after age 73. Talk to your health care provider about which screenings and vaccines you need and how often you need them. This information is not intended to replace advice given to you by your health care provider. Make sure you discuss any questions you have with your health care provider. Document Released: 12/18/2015 Document Revised: 08/10/2016 Document Reviewed: 09/22/2015 Elsevier Interactive Patient Education  2017 [place] Prevention in the Home Falls can cause injuries. They can happen to people of all ages. There are many things you can do to make your home safe and to help prevent falls. What can I do on the outside of my home? Regularly fix the edges of walkways and driveways and fix any cracks. Remove anything that might make you trip as you walk through a door, such as a raised step or threshold. Trim any bushes or trees on the path to your home. Use bright outdoor lighting. Clear any walking paths of anything that might make someone trip, such as rocks or tools. Regularly check to see if handrails are loose or broken. Make sure that both sides of any steps have handrails. Any raised decks and porches should have guardrails on the edges. Have any leaves, snow, or ice cleared regularly. Use sand or salt on walking paths during winter. Clean up any spills in your garage right away. This includes oil or grease spills. What can I do in the bathroom? Use night lights. Install grab bars by the toilet and in the tub and shower. Do not use towel bars as grab bars. Use non-skid mats or decals in the tub or  shower. If you need to sit down in the shower, use a plastic, non-slip stool. Keep the floor dry. Clean up any water that spills on the floor as soon as it happens. Remove soap buildup in the tub or shower regularly. Attach bath mats securely with double-sided non-slip rug tape. Do not have throw rugs and other things on the floor that can make you trip. What can I do in the bedroom? Use night lights. Make sure that you have a light by your bed that is easy to reach. Do not use any sheets or blankets that are too big for your bed. They should not hang down onto the floor. Have a firm chair that has side arms. You can use this for support while you get dressed. Do not have throw rugs and other things on the floor that can make you trip. What can I do in the kitchen? Clean up any spills right away. Avoid walking on wet floors. Keep items that you use a lot in easy-to-reach places. If you need to reach something above you, use a strong step stool that has a grab bar. Keep electrical cords out of the way. Do not use floor polish or wax that makes floors slippery. If you must use wax, use non-skid floor wax. Do not have throw rugs and other things on the floor that can make you trip. What can I do with my stairs? Do not leave any items on the stairs. Make sure that there are handrails on both sides of the  stairs and use them. Fix handrails that are broken or loose. Make sure that handrails are as [place] as the stairways. Check any carpeting to make sure that it is firmly attached to the stairs. Fix any carpet that is loose or worn. Avoid having throw rugs at the top or bottom of the stairs. If you do have throw rugs, attach them to the floor with carpet tape. Make sure that you have a light switch at the top of the stairs and the bottom of the stairs. If you do not have them, ask someone to add them for you. What else can I do to help prevent falls? Wear shoes that: Do not have high heels. Have  rubber bottoms. Are comfortable and fit you well. Are closed at the toe. Do not wear sandals. If you use a stepladder: Make sure that it is fully opened. Do not climb a closed stepladder. Make sure that both sides of the stepladder are locked into place. Ask someone to hold it for you, if possible. Clearly mark and make sure that you can see: Any grab bars or handrails. First and last steps. Where the edge of each step is. Use tools that help you move around (mobility aids) if they are needed. These include: Canes. Walkers. Scooters. Crutches. Turn on the lights when you go into a dark area. Replace any light bulbs as soon as they burn out. Set up your furniture so you have a clear path. Avoid moving your furniture around. If any of your floors are uneven, fix them. If there are any pets around you, be aware of where they are. Review your medicines with your doctor. Some medicines can make you feel dizzy. This can increase your chance of falling. Ask your doctor what other things that you can do to help prevent falls. This information is not intended to replace advice given to you by your health care provider. Make sure you discuss any questions you have with your health care provider. Document Released: 09/17/2009 Document Revised: 04/28/2016 Document Reviewed: 12/26/2014 Elsevier Interactive Patient Education  2017 [employer].

## 2021-09-22 NOTE — Progress Notes (Addendum)
I connected with Omar [name] today by telephone and verified that I am speaking with the correct person using two identifiers. Location patient: home Location provider: work Persons participating in the virtual visit: patient, provider.   I discussed the limitations, risks, security and privacy concerns of performing an evaluation and management service by telephone and the availability of in person appointments. I also discussed with the patient that there may be a patient responsible charge related to this service. The patient expressed understanding and verbally consented to this telephonic visit.    Interactive audio and video telecommunications were attempted between this provider and patient, however failed, due to patient having technical difficulties OR patient did not have access to video capability.  We continued and completed visit with audio only.  Some vital signs may be absent or patient reported.   Time Spent with patient on telephone encounter: 40 minutes  Subjective:   Omar Carbon. is a 75 y.o. male who presents for Medicare Annual/Subsequent preventive examination.  Review of Systems     Cardiac Risk Factors include: advanced age (>87men, >84 women)     Objective:    Today's Vitals   09/22/21 0925  PainSc: 0-No pain   There is no height or weight on file to calculate BMI.  Advanced Directives 09/22/2021 09/09/2021 08/05/2021 07/16/2021 07/09/2021 04/11/2018 09/13/2017  Does Patient Have a Medical Advance Directive? Yes Yes Yes Yes Yes No No  Type of Paramedic of [place];Living will [place];Living will [place];Living will [place];Living will [place];Living will - -  Does patient want to make changes to medical advance directive? - No - Patient declined No - Patient declined No - Patient declined No - Patient declined - -  Copy of [place]  in Chart? No - copy requested No - copy requested No - copy requested - - - -  Pre-existing out of facility DNR order (yellow form or pink MOST form) - - - - - - -    Current Medications (verified) Outpatient Encounter Medications as of 09/22/2021  Medication Sig   donepezil (ARICEPT) 10 MG tablet Take 1 tablet (10 mg total) by mouth at bedtime.   dronabinol (MARINOL) 5 MG capsule Take 1 capsule (5 mg total) by mouth 2 (two) times daily before a meal.   OVER THE COUNTER MEDICATION daily.   psyllium (METAMUCIL) 58.6 % powder Take 1 packet by mouth daily.   rosuvastatin (CRESTOR) 20 MG tablet Take 1 tablet (20 mg total) by mouth daily.   [DISCONTINUED] vardenafil (LEVITRA) 20 MG tablet Take 20 mg by mouth as needed.     Facility-Administered Encounter Medications as of 09/22/2021  Medication   0.9 %  sodium chloride infusion    Allergies (verified) Sulfonamide derivatives   History: Past Medical History:  Diagnosis Date   Anxiety    BPH (benign prostatic hypertrophy)    Decreased white blood cell count    Depression    Diverticulosis    Erectile dysfunction    Gastric polyps    GERD (gastroesophageal reflux disease)    History of colonic polyps    History of esophageal stricture 10/31/2007   History of kidney stones    History of prostate surgery    History of rheumatic fever    Hyperlipidemia    Hypertension    Prostate cancer ([place]) 11/09/2012   Prostatism    Past Surgical History:  Procedure Laterality  Date   CARPAL TUNNEL RELEASE     Bilateral   ESOPHAGEAL DILATION     last dilation 8'12   INGUINAL HERNIA REPAIR     INSERTION OF MESH N/A 04/15/2013   Procedure: INSERTION OF MESH;  Surgeon: Odis Hollingshead, MD;  Location: [place];  Service: General;  Laterality: N/A;   KNEE ARTHROSCOPY     left   LAPAROSCOPIC RIGHT HEMI COLECTOMY Right 08/31/2017   Procedure: LAPAROSCOPIC ASSISTED  RIGHT HEMI COLECTOMY;  Surgeon: Johnathan Hausen, MD;  Location: [facility];  Service:  General;  Laterality: Right;   LEFT HEART CATHETERIZATION WITH CORONARY ANGIOGRAM N/A 07/18/2014   Procedure: LEFT HEART CATHETERIZATION WITH CORONARY ANGIOGRAM;  Surgeon: Burnell Blanks, MD;  Location[facility] C[facility];  Service: Cardiovascular;  Laterality: N/A;   ROBOT ASSISTED LAPAROSCOPIC RADICAL PROSTATECTOMY  11/08/2012   Procedure: ROBOTIC ASSISTED LAPAROSCOPIC RADICAL PROSTATECTOMY LEVEL 1;  Surgeon: Molli Hazard, MD;  Location: [facility];  Service: Urology;  Laterality: N/A;      TONSILLECTOMY     UMBILICAL HERNIA REPAIR N/A 04/15/2013   Procedure: HERNIA REPAIR UMBILICAL ADULT;  Surgeon: Odis Hollingshead, MD;  Location: [place];  Service: General;  Laterality: N/A;   Family History  Problem Relation Age of Onset   Ovarian cancer Mother    Colon cancer Mother 59   High Cholesterol Mother    Stroke Father    Lung cancer Maternal Uncle    Lung cancer Maternal Aunt    Esophageal cancer Neg Hx    Stomach cancer Neg Hx    Rectal cancer Neg Hx    Pancreatic cancer Neg Hx    Liver disease Neg Hx    Social History   Socioeconomic History   Marital status: Married    Spouse name: Not on file   Number of children: 4   Years of education: Not on file   Highest education level: Not on file  Occupational History   Occupation: Theme park manager    Employer: [employer]  Tobacco Use   Smoking status: Former    Packs/day: 1.00    Years: 25.00    Pack years: 25.00    Types: Cigarettes    Quit date: 12/05/1993    Years since quitting: 27.8   Smokeless tobacco: Never  Vaping Use   Vaping Use: Never used  Substance and Sexual Activity   Alcohol use: No   Drug use: No   Sexual activity: Yes  Other Topics Concern   Not on file  Social History Narrative   Lives with wife in a one story home.  Has 4 children.  Works as a Theme park manager.     Education: 10th grade.    Caffeine use: soda (one per day)   Right handed   Social Determinants of Health   Financial Resource  Strain: Low Risk    Difficulty of Paying Living Expenses: Not hard at all  Food Insecurity: No Food Insecurity   Worried About Charity fundraiser in the Last Year: Never true   Ran Out of Food in the Last Year: Never true  Transportation Needs: No Transportation Needs   Lack of Transportation (Medical): No   Lack of Transportation (Non-Medical): No  Physical Activity: Sufficiently Active   Days of Exercise per Week: 5 days   Minutes of Exercise per Session: 30 min  Stress: No Stress Concern Present   Feeling of Stress : Not at all  Social Connections: Socially Integrated  Frequency of Communication with Friends and Family: More than three times a week   Frequency of Social Gatherings with Friends and Family: More than three times a week   Attends Religious Services: More than 4 times per year   Active Member of [employer] or Organizations: Yes   Attends Music therapist: More than 4 times per year   Marital Status: Married    Tobacco Counseling Counseling given: Not Answered   Clinical Intake:  Pre-visit preparation completed: Yes  Pain : No/denies pain Pain Score: 0-No pain     Nutritional Risks: None Diabetes: No  How often do you need to have someone help you when you read instructions, pamphlets, or other written materials from your doctor or pharmacy?: 1 - Never What is the last grade level you completed in school?: 10th grade  Diabetic? no  Interpreter Needed?: No  Information entered by :: Lisette Abu, LPN   Activities of Daily Living In your present state of health, do you have any difficulty performing the following activities: 09/22/2021  Hearing? N  Vision? N  Difficulty concentrating or making decisions? N  Walking or climbing stairs? N  Dressing or bathing? N  Doing errands, shopping? N  Preparing Food and eating ? N  Using the Toilet? N  In the past six months, have you accidently leaked urine? N  Do you have problems with loss  of bowel control? N  Managing your Medications? N  Managing your Finances? N  Housekeeping or managing your Housekeeping? N  Some recent data might be hidden    Patient Care Team: Horald Pollen, MD as PCP - General (Internal Medicine) Rolan Bucco, MD as Attending Physician (Urology) Inda Castle, MD (Inactive) as Attending Physician (Gastroenterology) Tanda Rockers, MD as Consulting Physician (Pulmonary Disease) Knox Royalty, RN as Case Manager  Indicate any recent Medical Services you may have received from other than [facility] providers in the past year (date may be approximate).     Assessment:   This is a routine wellness examination for Tariq.  Hearing/Vision screen Hearing Screening - Comments:: Patient denied any hearing difficulty.   No hearing aids.  Vision Screening - Comments:: Patient wears corrective glasses/contacts.  Eye exam done annually by: Rutherford Guys, MD.  Dietary issues and exercise activities discussed: Current Exercise Habits: Home exercise routine, Type of exercise: walking, Time (Minutes): 30, Frequency (Times/Week): 5, Weekly Exercise (Minutes/Week): 150, Intensity: Mild, Exercise limited by: respiratory conditions(s)   Goals Addressed   None   Depression Screen PHQ 2/9 Scores 09/22/2021 07/16/2021 07/09/2021 06/17/2021 05/26/2020 02/11/2020 01/21/2020  PHQ - 2 Score 0 0 0 0 0 0 0    Fall Risk Fall Risk  09/22/2021 07/09/2021 06/17/2021 04/29/2021 05/26/2020  Falls in the past year? 0 0 0 0 0  Number falls in past yr: 0 0 0 0 -  Injury with Fall? 0 0 0 0 -  Comment - N/A- no falls reported - - -  Risk for fall due to : No Fall Risks No Fall Risks - - -  Follow up Falls evaluation completed Falls prevention discussed - - Falls evaluation completed    FALL RISK PREVENTION PERTAINING TO THE HOME:  Any stairs in or around the home? No  If so, are there any without handrails? No  Home free of loose throw rugs in walkways, pet beds,  electrical cords, etc? Yes  Adequate lighting in your home to reduce risk of falls? Yes   ASSISTIVE  DEVICES UTILIZED TO PREVENT FALLS:  Life alert? No  Use of a cane, walker or w/c? No  Grab bars in the bathroom? No  Shower chair or bench in shower? No  Elevated toilet seat or a handicapped toilet? No   TIMED UP AND GO:  Was the test performed? No .  Length of time to ambulate 10 feet: n/a sec.   Gait steady and fast without use of assistive device  Cognitive Function:Yes, Patient has current diagnosis of cognitive impairment. Patient is followed by neurology for ongoing assessment.    Montreal Cognitive Assessment  01/13/2021 09/08/2020  Visuospatial/ Executive (0/5) 1 1  Naming (0/3) 3 3  Attention: Read list of digits (0/2) 2 1  Attention: Read list of letters (0/1) 1 1  Attention: Serial 7 subtraction starting at 100 (0/3) 1 1  Language: Repeat phrase (0/2) 0 1  Language : Fluency (0/1) 0 0  Abstraction (0/2) 1 1  Delayed Recall (0/5) 0 0  Orientation (0/6) 6 6  Total 15 15  Adjusted Score (based on education) 16 16   6CIT Screen 05/26/2020  What Year? 0 points  What month? 0 points  What time? 0 points  Count back from 20 0 points  Months in reverse 4 points  Repeat phrase 0 points  Total Score 4    Immunizations Immunization History  Administered Date(s) Administered   Influenza Split 11/09/2012, 09/04/2013   Influenza Whole 09/04/2016   Influenza, High Dose Seasonal PF 08/23/2017   Influenza,inj,Quad PF,6+ Mos 08/28/2014, 08/26/2015   PFIZER(Purple Top)SARS-COV-2 Vaccination 01/13/2020, 02/05/2020   Pneumococcal Conjugate-13 07/17/2014   Pneumococcal Polysaccharide-23 02/29/2012, 11/09/2012   Td 05/27/2009    TDAP status: Due, Education has been provided regarding the importance of this vaccine. Advised may receive this vaccine at local pharmacy or Health Dept. Aware to provide a copy of the vaccination record if obtained from local pharmacy or Health  Dept. Verbalized acceptance and understanding.  Flu Vaccine status: Up to date  Pneumococcal vaccine status: Up to date  Covid-19 vaccine status: Completed vaccines  Qualifies for Shingles Vaccine? Yes   Zostavax completed No   Shingrix Completed?: No.    Education has been provided regarding the importance of this vaccine. Patient has been advised to call insurance company to determine out of pocket expense if they have not yet received this vaccine. Advised may also receive vaccine at local pharmacy or Health Dept. Verbalized acceptance and understanding.  Screening Tests Health Maintenance  Topic Date Due   COVID-19 Vaccine (3 - Pfizer risk series) 03/04/2020   INFLUENZA VACCINE  10/04/2021 (Originally 07/05/2021)   TETANUS/TDAP  04/29/2022 (Originally 05/28/2019)   Zoster Vaccines- Shingrix (1 of 2) 04/29/2022 (Originally 01/25/1965)   COLONOSCOPY (Pts 45-32yrs Insurance coverage will need to be confirmed)  10/26/2023   Hepatitis C Screening  Completed   HPV VACCINES  Aged Out    Health Maintenance  Health Maintenance Due  Topic Date Due   COVID-19 Vaccine (3 - Pfizer risk series) 03/04/2020    Colorectal cancer screening: Type of screening: Colonoscopy. Completed 10/25/2018. Repeat every 5 years  Lung Cancer Screening: (Low Dose CT Chest recommended if Age 53-80 years, 30 pack-year currently smoking OR have quit w/in 15years.) does not qualify.   Lung Cancer Screening Referral: no  Additional Screening:  Hepatitis C Screening: does qualify; Completed: yes  Vision Screening: Recommended annual ophthalmology exams for early detection of glaucoma and other disorders of the eye. Is the patient up to date with  their annual eye exam?  Yes  Who is the provider or what is the name of the office in which the patient attends annual eye exams? Rutherford Guys, MD. If pt is not established with a provider, would they like to be referred to a provider to establish care? No .   Dental  Screening: Recommended annual dental exams for proper oral hygiene  Community Resource Referral / Chronic Care Management: CRR required this visit?  No   CCM required this visit?  No      Plan:     I have personally reviewed and noted the following in the patient's chart:   Medical and social history Use of alcohol, tobacco or illicit drugs  Current medications and supplements including opioid prescriptions. Patient is not currently taking opioid prescriptions. Functional ability and status Nutritional status Physical activity Advanced directives List of other physicians Hospitalizations, surgeries, and ER visits in previous 12 months Vitals Screenings to include cognitive, depression, and falls Referrals and appointments  In addition, I have reviewed and discussed with patient certain preventive protocols, quality metrics, and best practice recommendations. A written personalized care plan for preventive services as well as general preventive health recommendations were provided to patient.     Sheral Flow, LPN   70/34/0352   Nurse Notes:  There were no vitals filed for this visit. There is no height or weight on file to calculate BMI. Hearing Screening - Comments:: Patient denied any hearing difficulty.   No hearing aids.  Vision Screening - Comments:: Patient wears corrective glasses/contacts.  Eye exam done annually by: Rutherford Guys, MD.  Medical screening examination/treatment/procedure(s) were performed by non-physician practitioner and as supervising physician I was immediately available for consultation/collaboration.  I agree with above. Lew Dawes, MD

## 2021-10-26 ENCOUNTER — Encounter: Payer: Self-pay | Admitting: Emergency Medicine

## 2021-11-01 ENCOUNTER — Other Ambulatory Visit: Payer: Self-pay

## 2021-11-01 ENCOUNTER — Encounter: Payer: Self-pay | Admitting: Emergency Medicine

## 2021-11-01 ENCOUNTER — Ambulatory Visit (INDEPENDENT_AMBULATORY_CARE_PROVIDER_SITE_OTHER): Payer: Medicare Other | Admitting: Emergency Medicine

## 2021-11-01 VITALS — BP 130/70 | HR 95 | Temp 98.0°F | Ht 75.0 in | Wt 177.0 lb

## 2021-11-01 DIAGNOSIS — E785 Hyperlipidemia, unspecified: Secondary | ICD-10-CM

## 2021-11-01 DIAGNOSIS — D72819 Decreased white blood cell count, unspecified: Secondary | ICD-10-CM

## 2021-11-01 DIAGNOSIS — F0390 Unspecified dementia without behavioral disturbance: Secondary | ICD-10-CM | POA: Diagnosis not present

## 2021-11-01 DIAGNOSIS — I10 Essential (primary) hypertension: Secondary | ICD-10-CM | POA: Diagnosis not present

## 2021-11-01 LAB — COMPREHENSIVE METABOLIC PANEL
ALT: 20 U/L (ref 0–53)
AST: 16 U/L (ref 0–37)
Albumin: 4.2 g/dL (ref 3.5–5.2)
Alkaline Phosphatase: 62 U/L (ref 39–117)
BUN: 14 mg/dL (ref 6–23)
CO2: 32 mEq/L (ref 19–32)
Calcium: 9.4 mg/dL (ref 8.4–10.5)
Chloride: 102 mEq/L (ref 96–112)
Creatinine, Ser: 1.13 mg/dL (ref 0.40–1.50)
GFR: 63.47 mL/min (ref >60.00)
Glucose, Bld: 88 mg/dL (ref 70–99)
Potassium: 4.3 mEq/L (ref 3.5–5.1)
Sodium: 140 mEq/L (ref 135–145)
Total Bilirubin: 1 mg/dL (ref 0.2–1.2)
Total Protein: 6.3 g/dL (ref 6.0–8.3)

## 2021-11-01 LAB — LIPID PANEL
Cholesterol: 164 mg/dL (ref 0–200)
HDL: 67.2 mg/dL (ref >39.00)
LDL Cholesterol: 81 mg/dL (ref 0–99)
NonHDL: 97.04
Total CHOL/HDL Ratio: 2
Triglycerides: 80 mg/dL (ref 0.0–149.0)
VLDL: 16 mg/dL (ref 0.0–40.0)

## 2021-11-01 MED ORDER — ROSUVASTATIN CALCIUM 20 MG PO TABS: 20.0000 mg | ORAL_TABLET | Freq: Every day | ORAL | 3 refills | Status: DC

## 2021-11-01 NOTE — Assessment & Plan Note (Signed)
Stable and well-controlled.  Continue Aricept 10 mg daily.

## 2021-11-01 NOTE — Patient Instructions (Signed)
Health Maintenance After Age 75 After age 75, you are at a higher risk for certain [place]-term diseases and infections as well as injuries from falls. Falls are a major cause of broken bones and head injuries in people who are older than age 75. Getting regular preventive care can help to keep you healthy and well. Preventive care includes getting regular testing and making lifestyle changes as recommended by your health care provider. Talk with your health care provider about: Which screenings and tests you should have. A screening is a test that checks for a disease when you have no symptoms. A diet and exercise plan that is right for you. What should I know about screenings and tests to prevent falls? Screening and testing are the best ways to find a health problem early. Early diagnosis and treatment give you the best chance of managing medical conditions that are common after age 75. Certain conditions and lifestyle choices may make you more likely to have a fall. Your health care provider may recommend: Regular vision checks. Poor vision and conditions such as cataracts can make you more likely to have a fall. If you wear glasses, make sure to get your prescription updated if your vision changes. Medicine review. Work with your health care provider to regularly review all of the medicines you are taking, including over-the-counter medicines. Ask your health care provider about any side effects that may make you more likely to have a fall. Tell your health care provider if any medicines that you take make you feel dizzy or sleepy. Strength and balance checks. Your health care provider may recommend certain tests to check your strength and balance while standing, walking, or changing positions. Foot health exam. Foot pain and numbness, as well as not wearing proper footwear, can make you more likely to have a fall. Screenings, including: Osteoporosis screening. Osteoporosis is a condition that causes  the bones to get weaker and break more easily. Blood pressure screening. Blood pressure changes and medicines to control blood pressure can make you feel dizzy. Depression screening. You may be more likely to have a fall if you have a fear of falling, feel depressed, or feel unable to do activities that you used to do. Alcohol use screening. Using too much alcohol can affect your balance and may make you more likely to have a fall. Follow these instructions at home: Lifestyle Do not drink alcohol if: Your health care provider tells you not to drink. If you drink alcohol: Limit how much you have to: 0-1 drink a day for women. 0-2 drinks a day for men. Know how much alcohol is in your drink. In the [place] one drink equals one 12 oz bottle of beer (355 mL), one 5 oz glass of wine (148 mL), or one 1 oz glass of hard liquor (44 mL). Do not use any products that contain nicotine or tobacco. These products include cigarettes, chewing tobacco, and vaping devices, such as e-cigarettes. If you need help quitting, ask your health care provider. Activity  Follow a regular exercise program to stay fit. This will help you maintain your balance. Ask your health care provider what types of exercise are appropriate for you. If you need a cane or walker, use it as recommended by your health care provider. Wear supportive shoes that have nonskid soles. Safety  Remove any tripping hazards, such as rugs, cords, and clutter. Install safety equipment such as grab bars in bathrooms and safety rails on stairs. Keep rooms and walkways   well-lit. General instructions Talk with your health care provider about your risks for falling. Tell your health care provider if: You fall. Be sure to tell your health care provider about all falls, even ones that seem minor. You feel dizzy, tiredness (fatigue), or off-balance. Take over-the-counter and prescription medicines only as told by your health care provider. These include  supplements. Eat a healthy diet and maintain a healthy weight. A healthy diet includes low-fat dairy products, low-fat (lean) meats, and fiber from whole grains, beans, and lots of fruits and vegetables. Stay current with your vaccines. Schedule regular health, dental, and eye exams. Summary Having a healthy lifestyle and getting preventive care can help to protect your health and wellness after age 75. Screening and testing are the best way to find a health problem early and help you avoid having a fall. Early diagnosis and treatment give you the best chance for managing medical conditions that are more common for people who are older than age 75. Falls are a major cause of broken bones and head injuries in people who are older than age 75. Take precautions to prevent a fall at home. Work with your health care provider to learn what changes you can make to improve your health and wellness and to prevent falls. This information is not intended to replace advice given to you by your health care provider. Make sure you discuss any questions you have with your health care provider. Document Revised: 04/12/2021 Document Reviewed: 04/12/2021 Elsevier Patient Education  2022 Elsevier Inc.  

## 2021-11-01 NOTE — Assessment & Plan Note (Signed)
Well-controlled off medications.  Normal blood pressure readings at home and in the office today.

## 2021-11-01 NOTE — Assessment & Plan Note (Signed)
Benign.  No history of recurrent infections.  Negative work-up.

## 2021-11-01 NOTE — Assessment & Plan Note (Signed)
Stable.  Diet and nutrition discussed.  Continue rosuvastatin 20 mg daily.  

## 2021-11-01 NOTE — Progress Notes (Signed)
Omar [name]. 75 y.o.   Chief Complaint  Patient presents with   Follow-up    Cholesterol and BP     HISTORY OF PRESENT ILLNESS: This is a 74 y.o. male here for follow-up of hypertension and dyslipidemia. Here with his wife.  Has no complaints or medical concerns today. Normal blood pressure off medications. Takes rosuvastatin 20 mg daily. Has history of dementia, on Aricept.  Doing well. Physically active. Recently started on Marinol to increase his appetite.  It has helped. Last office visit with hematologist oncologist as follows:  ASSESSMENT:  1.  Leukopenia and neutropenia: - He was evaluated for leukopenia and CBC on 06/17/2021 showed white count 2.6 with ANC of 1.2. 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 - Work-up on 07/16/2021 showed white count 2.4 with ANC of 1.1.  ANA was negative.  Z61 and folic acid were normal.  SPEP, immunofixation were negative on 09/08/2020.  2.  Social/family history: - He worked at [employer].  He also worked as a Theme park manager and currently retired. - He does all his ADLs and IADLs and drives. - Quit smoking more than 20 years ago. - Mother had colon cancer.  Brother had cancer.  Daughter had breast cancer.  Maternal uncle also had cancer, patient does not know type.  3.  Prostate cancer: - He underwent prostatectomy on 11/08/2012. - Pathology showed Gleason score 3+4= 7, PT2CNX. - Last PSA on 07/16/2021 was undetectable.     PLAN:  1.  Leukopenia and neutropenia: - Work-up for nutritional deficiencies, connective tissue disorders and plasma cell disorder was negative. - He had low white count with low ANC ranging between 1000-18 100 for few years with no recurrent infections.  Differential diagnosis includes benign ethnic neutropenia. - Recommend repeat blood counts in 4 months.  If there is any worsening, will consider bone marrow biopsy.  2.  Weight loss: - He has unexplained weight loss of 25 to 30 pounds  in the past 6 months. - He is also eating less.  Wife thinks he has mild dementia. - We reviewed CT CAP done for weight loss from 09/07/2021 which did not show any evidence of adenopathy or suspicious lesions.  No bone mets.  Benign hemangioma in the right hepatic lobe. - We will start him on Marinol 5 mg twice daily for appetite stimulation. - RTC 4 months.   Orders placed this encounter:  No orders of the defined types were placed in this encounter.       Derek Jack, MD [place] [phone]    HPI   Prior to Admission medications   Medication Sig Start Date End Date Taking? Authorizing Provider  donepezil (ARICEPT) 10 MG tablet Take 1 tablet (10 mg total) by mouth at bedtime. 01/13/21  Yes Sater, Nanine Means, MD  dronabinol (MARINOL) 5 MG capsule Take 1 capsule (5 mg total) by mouth 2 (two) times daily before a meal. 09/09/21  Yes Derek Jack, MD  OVER THE COUNTER MEDICATION daily.   Yes [provider]  psyllium (METAMUCIL) 58.6 % powder Take 1 packet by mouth daily.   Yes Horald Pollen, MD  rosuvastatin (CRESTOR) 20 MG tablet Take 1 tablet (20 mg total) by mouth daily. 03/31/21  Yes Navie Lamoreaux, Ines Bloomer, MD  vardenafil (LEVITRA) 20 MG tablet Take 20 mg by mouth as needed.    02/29/12  [provider]    Allergies  Allergen Reactions  Sulfonamide Derivatives Other (See Comments)     blisters on skin    Patient Active Problem List   Diagnosis Date Noted   History of colon cancer 06/17/2021   Lipoma of anterior chest wall 04/29/2021   Snoring 01/13/2021   Dementia without behavioral disturbance ([place]) 01/13/2021   Excessive daytime sleepiness 01/13/2021   Memory loss 09/08/2020   Numbness 09/08/2020   Gait disturbance 09/08/2020   Diverticulosis 04/04/2019   Internal hemorrhoids 04/04/2019   Loss of weight 01/01/2019   Dysplastic mass of the cecum s/p lap right colectomy 08/31/2017 09/02/2017   S/P right colectomy  08/31/2017   Polyneuropathy 11/21/2016   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   History of pulmonary embolism 07/18/2014   Adrenal adenoma 03/18/2014   Coronary atherosclerosis of native coronary artery 03/18/2014   Hyperlipidemia 11/13/2013   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/2013   Prostate cancer ([place]) 11/09/2012   Stricture and stenosis of esophagus 05/31/2012   Esophageal reflux 10/09/2007   Dyslipidemia 07/16/2007   Depression 07/16/2007   COLONIC POLYPS, HX OF 07/16/2007    Past Medical History:  Diagnosis Date   Anxiety    BPH (benign prostatic hypertrophy)    Decreased white blood cell count    Depression    Diverticulosis    Erectile dysfunction    Gastric polyps    GERD (gastroesophageal reflux disease)    History of colonic polyps    History of esophageal stricture 10/31/2007   History of kidney stones    History of prostate surgery    History of rheumatic fever    Hyperlipidemia    Hypertension    Prostate cancer ([place]) 11/09/2012   Prostatism     Past Surgical History:  Procedure Laterality Date   CARPAL TUNNEL RELEASE     Bilateral   ESOPHAGEAL DILATION     last dilation 8'12   INGUINAL HERNIA REPAIR     INSERTION OF MESH N/A 04/15/2013   Procedure: INSERTION OF MESH;  Surgeon: Odis Hollingshead, MD;  Location: [facility];  Service: General;  Laterality: N/A;   KNEE ARTHROSCOPY     left   LAPAROSCOPIC RIGHT HEMI COLECTOMY Right 08/31/2017   Procedure: LAPAROSCOPIC ASSISTED  RIGHT HEMI COLECTOMY;  Surgeon: Johnathan Hausen, MD;  Location: [facility];  Service: General;  Laterality: Right;   LEFT HEART CATHETERIZATION WITH CORONARY ANGIOGRAM N/A 07/18/2014   Procedure: LEFT HEART CATHETERIZATION WITH CORONARY ANGIOGRAM;  Surgeon: Burnell Blanks, MD;  Location: [facility] CATH [facility];  Service: Cardiovascular;  Laterality: N/A;   ROBOT ASSISTED LAPAROSCOPIC RADICAL PROSTATECTOMY  11/08/2012   Procedure: ROBOTIC ASSISTED LAPAROSCOPIC RADICAL  PROSTATECTOMY LEVEL 1;  Surgeon: Molli Hazard, MD;  Location: [facility];  Service: Urology;  Laterality: N/A;      TONSILLECTOMY     UMBILICAL HERNIA REPAIR N/A 04/15/2013   Procedure: HERNIA REPAIR UMBILICAL ADULT;  Surgeon: Odis Hollingshead, MD;  Location: [facility];  Service: General;  Laterality: N/A;    Social History   Socioeconomic History   Marital status: Married    Spouse name: Not on file   Number of children: 4   Years of education: Not on file   Highest education level: Not on file  Occupational History   Occupation: Theme park manager    Employer: [employer]  Tobacco Use   Smoking status: Former    Packs/day: 1.00    Years: 25.00    Pack years: 25.00    Types: Cigarettes    Quit  date: 12/05/1993    Years since quitting: 27.9   Smokeless tobacco: Never  Vaping Use   Vaping Use: Never used  Substance and Sexual Activity   Alcohol use: No   Drug use: No   Sexual activity: Yes  Other Topics Concern   Not on file  Social History Narrative   Lives with wife in a one story home.  Has 4 children.  Works as a Education officer, environmental.     Education: 10th grade.    Caffeine use: soda (one per day)   Right handed   Social Determinants of Health   Financial Resource Strain: Low Risk    Difficulty of Paying Living Expenses: Not hard at all  Food Insecurity: No Food Insecurity   Worried About Programme researcher, broadcasting/film/video in the Last Year: Never true   Ran Out of Food in the Last Year: Never true  Transportation Needs: No Transportation Needs   Lack of Transportation (Medical): No   Lack of Transportation (Non-Medical): No  Physical Activity: Sufficiently Active   Days of Exercise per Week: 5 days   Minutes of Exercise per Session: 30 min  Stress: No Stress Concern Present   Feeling of Stress : Not at all  Social Connections: Socially Integrated   Frequency of Communication with Friends and Family: More than three times a week   Frequency of Social Gatherings with Friends and  Family: More than three times a week   Attends Religious Services: More than 4 times per year   Active Member of [employer] or Organizations: Yes   Attends Engineer, structural: More than 4 times per year   Marital Status: Married  Catering manager Violence: Not At Risk   Fear of Current or Ex-Partner: No   Emotionally Abused: No   Physically Abused: No   Sexually Abused: No    Family History  Problem Relation Age of Onset   Ovarian cancer Mother    Colon cancer Mother 12   High Cholesterol Mother    Stroke Father    Lung cancer Maternal Uncle    Lung cancer Maternal Aunt    Esophageal cancer Neg Hx    Stomach cancer Neg Hx    Rectal cancer Neg Hx    Pancreatic cancer Neg Hx    Liver disease Neg Hx      Review of Systems  Constitutional: Negative.  Negative for chills and fever.  HENT: Negative.  Negative for congestion and sore throat.   Respiratory: Negative.  Negative for cough and shortness of breath.   Cardiovascular: Negative.  Negative for chest pain and palpitations.  Gastrointestinal: Negative.  Negative for abdominal pain, diarrhea, nausea and vomiting.  Genitourinary: Negative.  Negative for dysuria and hematuria.  Skin: Negative.  Negative for rash.  Neurological: Negative.  Negative for dizziness and headaches.  All other systems reviewed and are negative. Today's Vitals   11/01/21 1027 11/01/21 1107  BP: 138/86 130/70  Pulse: 95   Temp: 98 F (36.7 C)   TempSrc: Oral   SpO2: 98%   Weight: 177 lb (80.3 kg)   Height: 6\' 3"  (1.905 m)    Body mass index is 22.12 kg/m. BP Readings from Last 3 Encounters:  11/01/21 130/70  09/09/21 (!) 143/83  08/05/21 (!) 156/94     Physical Exam Vitals reviewed.  Constitutional:      Appearance: Normal appearance.  HENT:     Head: Normocephalic.  Eyes:     Extraocular Movements: Extraocular movements intact.  Conjunctiva/sclera: Conjunctivae normal.     Pupils: Pupils are equal, round, and reactive  to light.  Cardiovascular:     Rate and Rhythm: Normal rate and regular rhythm.     Pulses: Normal pulses.     Heart sounds: Normal heart sounds.  Pulmonary:     Effort: Pulmonary effort is normal.     Breath sounds: Normal breath sounds.  Musculoskeletal:     Cervical back: Normal range of motion and neck supple.     Right lower leg: No edema.     Left lower leg: No edema.  Skin:    General: Skin is warm and dry.     Capillary Refill: Capillary refill takes less than 2 seconds.  Neurological:     General: No focal deficit present.     Mental Status: He is alert and oriented to person, place, and time.  Psychiatric:        Mood and Affect: Mood normal.        Behavior: Behavior normal.     ASSESSMENT & PLAN: Problem List Items Addressed This Visit       Cardiovascular and Mediastinum   Essential hypertension    Well-controlled off medications.  Normal blood pressure readings at home and in the office today.      Relevant Medications   rosuvastatin (CRESTOR) 20 MG tablet     Nervous and Auditory   Dementia without behavioral disturbance (HCC)    Stable and well-controlled.  Continue Aricept 10 mg daily.        Other   Dyslipidemia - Primary    Stable.  Diet and nutrition discussed. Continue rosuvastatin 20 mg daily.      Relevant Medications   rosuvastatin (CRESTOR) 20 MG tablet   Other Relevant Orders   Comprehensive metabolic panel   Lipid panel   Leukopenia    Benign.  No history of recurrent infections.  Negative work-up.      Patient Instructions  Health Maintenance After Age 73 After age 61, you are at a higher risk for certain [place]-term diseases and infections as well as injuries from falls. Falls are a major cause of broken bones and head injuries in people who are older than age 61. Getting regular preventive care can help to keep you healthy and well. Preventive care includes getting regular testing and making lifestyle changes as recommended by  your health care provider. Talk with your health care provider about: Which screenings and tests you should have. A screening is a test that checks for a disease when you have no symptoms. A diet and exercise plan that is right for you. What should I know about screenings and tests to prevent falls? Screening and testing are the best ways to find a health problem early. Early diagnosis and treatment give you the best chance of managing medical conditions that are common after age 17. Certain conditions and lifestyle choices may make you more likely to have a fall. Your health care provider may recommend: Regular vision checks. Poor vision and conditions such as cataracts can make you more likely to have a fall. If you wear glasses, make sure to get your prescription updated if your vision changes. Medicine review. Work with your health care provider to regularly review all of the medicines you are taking, including over-the-counter medicines. Ask your health care provider about any side effects that may make you more likely to have a fall. Tell your health care provider if any medicines that you take  make you feel dizzy or sleepy. Strength and balance checks. Your health care provider may recommend certain tests to check your strength and balance while standing, walking, or changing positions. Foot health exam. Foot pain and numbness, as well as not wearing proper footwear, can make you more likely to have a fall. Screenings, including: Osteoporosis screening. Osteoporosis is a condition that causes the bones to get weaker and break more easily. Blood pressure screening. Blood pressure changes and medicines to control blood pressure can make you feel dizzy. Depression screening. You may be more likely to have a fall if you have a fear of falling, feel depressed, or feel unable to do activities that you used to do. Alcohol use screening. Using too much alcohol can affect your balance and may make you  more likely to have a fall. Follow these instructions at home: Lifestyle Do not drink alcohol if: Your health care provider tells you not to drink. If you drink alcohol: Limit how much you have to: 0-1 drink a day for women. 0-2 drinks a day for men. Know how much alcohol is in your drink. In the [place] one drink equals one 12 oz bottle of beer (355 mL), one 5 oz glass of wine (148 mL), or one 1 oz glass of hard liquor (44 mL). Do not use any products that contain nicotine or tobacco. These products include cigarettes, chewing tobacco, and vaping devices, such as e-cigarettes. If you need help quitting, ask your health care provider. Activity  Follow a regular exercise program to stay fit. This will help you maintain your balance. Ask your health care provider what types of exercise are appropriate for you. If you need a cane or walker, use it as recommended by your health care provider. Wear supportive shoes that have nonskid soles. Safety  Remove any tripping hazards, such as rugs, cords, and clutter. Install safety equipment such as grab bars in bathrooms and safety rails on stairs. Keep rooms and walkways well-lit. General instructions Talk with your health care provider about your risks for falling. Tell your health care provider if: You fall. Be sure to tell your health care provider about all falls, even ones that seem minor. You feel dizzy, tiredness (fatigue), or off-balance. Take over-the-counter and prescription medicines only as told by your health care provider. These include supplements. Eat a healthy diet and maintain a healthy weight. A healthy diet includes low-fat dairy products, low-fat (lean) meats, and fiber from whole grains, beans, and lots of fruits and vegetables. Stay current with your vaccines. Schedule regular health, dental, and eye exams. Summary Having a healthy lifestyle and getting preventive care can help to protect your health and wellness after age  60. Screening and testing are the best way to find a health problem early and help you avoid having a fall. Early diagnosis and treatment give you the best chance for managing medical conditions that are more common for people who are older than age 7. Falls are a major cause of broken bones and head injuries in people who are older than age 14. Take precautions to prevent a fall at home. Work with your health care provider to learn what changes you can make to improve your health and wellness and to prevent falls. This information is not intended to replace advice given to you by your health care provider. Make sure you discuss any questions you have with your health care provider. Document Revised: 04/12/2021 Document Reviewed: 04/12/2021 Elsevier Patient Education  2022 Elsevier  Inc.    Agustina Caroli, MD [facility]

## 2021-11-08 ENCOUNTER — Ambulatory Visit (INDEPENDENT_AMBULATORY_CARE_PROVIDER_SITE_OTHER): Payer: Medicare Other | Admitting: *Deleted

## 2021-11-08 DIAGNOSIS — E785 Hyperlipidemia, unspecified: Secondary | ICD-10-CM

## 2021-11-08 DIAGNOSIS — F0390 Unspecified dementia without behavioral disturbance: Secondary | ICD-10-CM

## 2021-11-08 DIAGNOSIS — D72819 Decreased white blood cell count, unspecified: Secondary | ICD-10-CM

## 2021-11-08 NOTE — Patient Instructions (Signed)
Visit Information  Omar [name], "Omar [name]" Thank you for taking time to talk with me today. Please don't hesitate to contact me if I can be of assistance to you before our next scheduled telephone appointment.  Below are the goals we discussed today:   Patient Self-Care Activities: [name], [name]" will:                 Take medications as prescribed Attend all scheduled provider appointments Call pharmacy for medication refills Call provider office for new concerns or questions Continue to follow heart healthy, low salt, low cholesterol diet Stay as active as possible around weather conditions Continue your efforts to prevent falls  Our next appointment is by telephone on Wednesday, January 25, 2022 at 11:30 am  Please call the care guide team at [phone] if you need to cancel or reschedule your appointment.   If you are experiencing a Mental Health or [place] or need someone to talk to, please  call the Suicide and Crisis Lifeline: 988 call the [place] National Suicide Prevention Lifeline: 864-871-4719 or TTY: 651-024-6962 TTY 931-320-7879) to talk to a trained counselor call 1-800-273-TALK (toll free, 24 hour hotline) go to [facility] [place] [phone]) call the [place]: [phone] call 911   The patient verbalized understanding of instructions, educational materials, and care plan provided today and agreed to receive a mailed copy of patient instructions, educational materials, and care plan  Oneta Rack, RN, BSN, [place] [phone]: direct office [phone]: mobile   Infection Prevention in the Home If you have an infection, may have been exposed to an infection, or are taking care of someone who has an infection, it is important to know how to keep the infection from spreading. Follow your  health care provider's instructions and use these guidelines to help stop the spread of infection. How infections are spread In order for an infection to spread, the following must be present: A germ. This may be a virus, bacteria, fungus, or parasite. A place for the germ to live. This may be: On or in a person, animal, plant, or food. In soil or water. On surfaces, such as a door handle. A person or animal who can develop a disease if the germ enters the body (host). The host does not have resistance to the germ. A way for the germ to enter the host. This may occur by: Direct contact with an infected person or animal. This can happen through shaking hands or hugging. Some germs can also travel through the air and spread to others. This can happen when an infected person coughs or sneezes on or near other people. Indirect contact. This occurs when the germ enters the host through contact with an infected object. Examples include: Eating or drinking food or water that has the germ (is contaminated). Touching a contaminated surface with your hands, and then touching your face, eyes, nose, or mouth. Supplies needed: Soap. Alcohol-based hand sanitizer. Standard cleaning products. Disinfectants, such as bleach. Reusable cleaning cloths, sponges, or paper towels. Disposable or reusable utility gloves. How to prevent infection from spreading There are several things that you can do to help prevent infection from spreading. Take these general actions Everyone should take the following actions to prevent the spread of infection: Wash your hands often with soap and water for at least 20 seconds. If soap and water are not available,  use alcohol-based hand sanitizer. Avoid touching your face, mouth, nose, or eyes. Cough or sneeze into a tissue, sleeve, or elbow instead of into your hand or into the air. If you cough or sneeze into a tissue, throw it away immediately and wash your hands.  Keep your  bathroom clean Provide soap. Change towels and washcloths frequently. Change toothbrushes often and store them separately in a clean, dry place. Clean and disinfect all surfaces, including the toilet, floor, tub, shower, and sink. Do not share personal items, such as razors, toothbrushes, deodorant, combs, brushes, towels, and washcloths. Maintain hygiene in the [facility] your hands before and after preparing food and before you eat. Clean the inside of your refrigerator each week. Keep your refrigerator set at 22F (4C) or less, and set your freezer at 64F (-18C) or less. Keep work surfaces clean. Disinfect them regularly. Wash your dishes in hot, soapy water. Air-dry your dishes or use a dishwasher. Do not share dishes or eating utensils. Handle food safely Store food carefully. Refrigerate leftovers promptly in covered containers. Throw out stale or spoiled food. Thaw foods in the refrigerator or microwave, not at room temperature. Serve foods at the proper temperature. Do not eat raw meat. Make sure it is cooked to the appropriate temperature. Cook eggs until they are firm. Wash fruits and vegetables under running water. Use separate cutting boards, plates, and utensils for raw foods and cooked foods. Use a clean spoon each time you sample food while cooking. Do laundry the right way Wear gloves if laundry is visibly soiled. Do not shake soiled laundry. Doing that may send germs into the air. Wash laundry in hot water. If you cannot wash the laundry right away, place it in a plastic bag and wash it as soon as possible. Be careful around animals and pets Wash your hands before and after touching animals. If you have a pet, ensure that your pet stays clean. Do not let people with weak immune systems touch bird droppings, fish tank water, or a litter box. If you have a pet cage or litter box, be sure to clean it every day. If you are sick, stay away from animals and have  someone else care for them if possible. How to clean and disinfect objects and surfaces Precautions Some disinfectants work for certain germs and not others. Read the manufacturer's instructions or read online resources to determine if the product you are using will work for the germ you are trying to remove. If you choose to use bleach, use it safely. Never mix it with other cleaning products, especially those that contain ammonia. This mixture can create a dangerous gas that may be deadly. Keep proper movement of fresh air in your home (ventilation). Pour used mop water down the utility sink or toilet. Do not pour this water down the kitchen sink. Objects and surfaces  If surfaces are visibly soiled, clean them first with soap and water before disinfecting. Disinfect surfaces that are frequently touched every day. This may include: Counters. Tables. Doorknobs. Sinks and faucets. Electronics, such as: Architectural technologist. Remote controls. Keyboards. Computers and tablets. Cleaning supplies Some cleaning supplies can breed germs. Take good care of them to prevent germs from spreading. To do this: Soak toilet brushes, mops, and sponges in bleach and water for 5 minutes after each use, or according to manufacturer's instructions. Wash reusable cleaning cloths and sanitize sponges after each use. Throw away disposable gloves after one use. Replace reusable utility gloves if they  are cracked or torn or if they start to peel. Additional actions if you are sick If you live with other people:  Avoid close contact with those around you. Stay at least 3 ft (1 m) away from others, if possible. Use a separate bathroom, if possible. If possible, sleep in a separate bedroom or in a separate bed to prevent infecting other household members. Change bedroom linens each week or whenever they are soiled. Have everyone in the household wash hands often with soap and water. If soap and water are not available, use  alcohol-based hand sanitizer. In general: Stay home except to get medical care. Call ahead before visiting your health care provider. Ask others to get groceries and household supplies and to refill prescriptions for you. Avoid public areas. Try not to take public transportation. If you can, wear a mask if you need to go out of the house, or if you are in close contact with someone who is not sick. Avoid visitors until you have completely recovered, or until you have no signs and symptoms of infection. Avoid preparing food or providing care for others. If you must prepare food or provide care for others, wear a mask and wash your hands before and after doing these things. Where to find more information Centers for Disease Control and Prevention: PromotionalReview.nl World Health Organization [facility]): PrintStats.tn Association for Professionals in Infection Control and Epidemiology: professionals.site.StadiumBlog.se Summary It is important to know how to keep the infection from spreading. Make sure everyone in your household washes their hands often with soap and water. Disinfect surfaces that are frequently touched every day. If you are sick, stay home except to get medical care. This information is not intended to replace advice given to you by your health care provider. Make sure you discuss any questions you have with your health care provider. Document Revised: 01/27/2020 Document Reviewed: 02/15/2019 Elsevier Patient Education  [place].

## 2021-11-08 NOTE — Chronic Care Management (AMB) (Signed)
Chronic Care Management   CCM RN Visit Note  11/08/2021 Name: Omar [name]. MRN: [id number] DOB: December 21, 1945  Subjective: Omar [name]. is a 75 y.o. year old male who is a primary care patient of Mitchel Honour, Ines Bloomer, MD. The care management team was consulted for assistance with disease management and care coordination needs.    Engaged with patient by telephone for follow up visit in response to provider referral for case management and/or care coordination services.   Consent to Services:  The patient was given information about Chronic Care Management services, agreed to services, and gave verbal consent prior to initiation of services.  Please see initial visit note for detailed documentation.  Patient agreed to services and verbal consent obtained.   Assessment: Review of patient past medical history, allergies, medications, health status, including review of consultants reports, laboratory and other test data, was performed as part of comprehensive evaluation and provision of chronic care management services.   SDOH (Social Determinants of Health) assessments and interventions performed:  SDOH Interventions    Flowsheet Row Most Recent Value  SDOH Interventions   Food Insecurity Interventions Intervention Not Indicated  Financial Strain Interventions Intervention Not Indicated  Housing Interventions Intervention Not Indicated  Transportation Interventions Intervention Not Indicated  [Patient reports he continues to drive self]      CCM Care Plan Allergies  Allergen Reactions   Sulfonamide Derivatives Other (See Comments)     blisters on skin   Outpatient Encounter Medications as of 11/08/2021  Medication Sig Note   donepezil (ARICEPT) 10 MG tablet Take 1 tablet (10 mg total) by mouth at bedtime.    dronabinol (MARINOL) 5 MG capsule Take 1 capsule (5 mg total) by mouth 2 (two) times daily before a meal.    OVER THE COUNTER MEDICATION daily. 01/21/2020: As needed  Premier protein   psyllium (METAMUCIL) 58.6 % powder Take 1 packet by mouth daily.    rosuvastatin (CRESTOR) 20 MG tablet Take 1 tablet (20 mg total) by mouth daily.    [DISCONTINUED] vardenafil (LEVITRA) 20 MG tablet Take 20 mg by mouth as needed.      Facility-Administered Encounter Medications as of 11/08/2021  Medication   0.9 %  sodium chloride infusion   Patient Active Problem List   Diagnosis Date Noted   Essential hypertension 11/01/2021   History of colon cancer 06/17/2021   Lipoma of anterior chest wall 04/29/2021   Snoring 01/13/2021   Dementia without behavioral disturbance ([place]) 01/13/2021   Excessive daytime sleepiness 01/13/2021   Memory loss 09/08/2020   Numbness 09/08/2020   Gait disturbance 09/08/2020   Diverticulosis 04/04/2019   Internal hemorrhoids 04/04/2019   Loss of weight 01/01/2019   Dysplastic mass of the cecum s/p lap right colectomy 08/31/2017 09/02/2017   S/P right colectomy 08/31/2017   Polyneuropathy 11/21/2016   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   History of pulmonary embolism 07/18/2014   Adrenal adenoma 03/18/2014   Coronary atherosclerosis of native coronary artery 03/18/2014   Hyperlipidemia 11/13/2013   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/2013   Prostate cancer ([place]) 11/09/2012   Stricture and stenosis of esophagus 05/31/2012   Leukopenia 05/27/2009   Esophageal reflux 10/09/2007   Dyslipidemia 07/16/2007   Depression 07/16/2007   COLONIC POLYPS, HX OF 07/16/2007   Conditions to be addressed/monitored:  HLD, Dementia, and leukopenia  Care Plan : RN Care Manager Plan of Care  Updates made by Knox Royalty, RN since 11/08/2021 12:00 AM  Problem: Chronic Disease management Needs   Priority: Medium     [name]   Start Date: 07/09/2021  Expected End Date: 07/09/2022  Priority: Medium  Note:   Current Barriers:  Chronic Disease  Management support and education needs related to HLD and memory loss/ dementia Recent weight loss of ~35 pounds in 4 months, per patient report  RNCM Clinical Goal(s):  Patient will demonstrate ongoing adherence to prescribed treatment plan for HLD and memory loss as evidenced by adherence to prescribed medication regimen contacting provider for new or worsened symptoms or questions attending all provider appointments, ongoing adherence to diet low in cholesterol  through collaboration with RN Care manager, provider, and care team.   Interventions: 1:1 collaboration with primary care provider regarding development and update of comprehensive plan of care as evidenced by provider attestation and co-signature Inter-disciplinary care team collaboration (see longitudinal plan of care) Evaluation of current treatment plan related to  self management and patient's adherence to plan as established by provider;  Hyperlipidemia:  (Status: Goal on Track (progressing): YES.) Lab Results  Component Value Date   CHOL 164 11/01/2021   HDL 67.20 11/01/2021   [place] 81 11/01/2021   LDLDIRECT 114.0 08/16/2016   TRIG 80.0 11/01/2021   CHOLHDL 2 11/01/2021    Provider established cholesterol goals reviewed; Counseled on importance of regular laboratory monitoring as prescribed; Reviewed role and benefits of statin for ASCVD risk reduction; Reviewed importance of limiting foods high in cholesterol; Screening for signs and symptoms of depression related to chronic disease state;  Assessed social determinant of health barriers;  Discussed with patient and caregiver benefits of patient maintaining regular activity around weather conditions- patient reports regular active lifestyle at baseline Reviewed with patient/ spouse recent PCP office visit 11/01/21- confirmed patient has started taking newly prescribed marinol for appetite: reports "helping a little bit" to improve appetite- he currently denies concerns  around ongoing weight loss, states, "it seems to be pretty stable" Reviewed most recent HLD labs with patient; confirmed he continues taking rosuvastatin as prescribed Reviewed upcoming provider appointments with patient/ caregiver:  01/11/22- oncology/ hematology labs; 01/13/22- neurology provider (annual visit); 01/18/22- hematology provider office visit Confirmed patient continues trying to follow low salt, low cholesterol, heart healthy diet; positive reinforcement provided with encouragement to continue efforts  Memory Loss  (Status: Goal on Track (progressing): YES.) Evaluation of current treatment plan related to  memory loss/ dementia ,  self-management and patient's adherence to plan as established by provider Discussed plans with patient for ongoing care management follow up and provided patient with direct contact information for care management team Discussed ongoing memory issues with patient and spouse- they deny changes, reports patient "no better no worse," spouse reports she continues to remind patient of things he forgets, assists with medication administration, confirms spouse has added app to her phone to ensure she is aware of patient's whereabouts "all the time;" she denies that patient has unsafe behavior at home: does not wander off, does not attempt to drive without someone knowing where is going; patient and spouse report no signs/ symptyoms of progressive memory changes since our last outreach Confirmed no concerns around current medications Confirmed no new/ recent falls: continues not using cane/ walker- states "I am steady on my feet for the most part;" fall prevention education reinforced Confirmed patient is aware of and has plans to attend as scheduled upcoming neurology provider appointment on 01/18/22  Patient  Goals/Self-Care Activities: As evidenced by review of EHR, collaboration with care team, and patient reporting during CCM RN CM outreach, Patient Jemarion, "Richard"  will:                 Take medications as prescribed Attend all scheduled provider appointments Call pharmacy for medication refills Call provider office for new concerns or questions Continue to follow heart healthy, low salt, low cholesterol diet Stay as active as possible around weather conditions Continue your efforts to prevent falls    Plan: Telephone follow up appointment with care management team member scheduled for:  Wednesday, January 25, 2022 at 11:30 am The patient has been provided with contact information for the care management team and has been advised to call with any health related questions or concerns  Oneta Rack, RN, BSN, [place] [phone]: direct office [phone]: mobile

## 2021-11-09 ENCOUNTER — Emergency Department (HOSPITAL_COMMUNITY)
Admission: EM | Admit: 2021-11-09 | Discharge: 2021-11-09 | Disposition: A | Discharge status: Home / Self Care | Payer: Medicare Other | Source: Home / Self Care

## 2021-11-18 DIAGNOSIS — C61 Malignant neoplasm of prostate: Secondary | ICD-10-CM | POA: Diagnosis not present

## 2021-11-25 DIAGNOSIS — N5201 Erectile dysfunction due to arterial insufficiency: Secondary | ICD-10-CM | POA: Diagnosis not present

## 2021-11-25 DIAGNOSIS — N393 Stress incontinence (female) (male): Secondary | ICD-10-CM | POA: Diagnosis not present

## 2021-11-25 DIAGNOSIS — C61 Malignant neoplasm of prostate: Secondary | ICD-10-CM | POA: Diagnosis not present

## 2021-12-04 DIAGNOSIS — F0390 Unspecified dementia without behavioral disturbance: Secondary | ICD-10-CM | POA: Diagnosis not present

## 2021-12-04 DIAGNOSIS — E785 Hyperlipidemia, unspecified: Secondary | ICD-10-CM | POA: Diagnosis not present

## 2022-01-04 ENCOUNTER — Other Ambulatory Visit (HOSPITAL_COMMUNITY): Payer: Self-pay

## 2022-01-04 DIAGNOSIS — Z8546 Personal history of malignant neoplasm of prostate: Secondary | ICD-10-CM

## 2022-01-04 MED ORDER — DRONABINOL 5 MG PO CAPS: 5.0000 mg | ORAL_CAPSULE | Freq: Two times a day (BID) | ORAL | 3 refills | Status: DC

## 2022-01-11 ENCOUNTER — Inpatient Hospital Stay (HOSPITAL_COMMUNITY): Payer: Medicare Other

## 2022-01-13 ENCOUNTER — Encounter: Payer: Self-pay | Admitting: Neurology

## 2022-01-13 ENCOUNTER — Ambulatory Visit (INDEPENDENT_AMBULATORY_CARE_PROVIDER_SITE_OTHER): Payer: Medicare Other | Admitting: Neurology

## 2022-01-13 VITALS — BP 146/64 | HR 65 | Ht 75.0 in | Wt 174.5 lb

## 2022-01-13 DIAGNOSIS — G629 Polyneuropathy, unspecified: Secondary | ICD-10-CM

## 2022-01-13 DIAGNOSIS — R200 Anesthesia of skin: Secondary | ICD-10-CM

## 2022-01-13 DIAGNOSIS — F0390 Unspecified dementia without behavioral disturbance: Secondary | ICD-10-CM

## 2022-01-13 DIAGNOSIS — R269 Unspecified abnormalities of gait and mobility: Secondary | ICD-10-CM

## 2022-01-13 MED ORDER — DONEPEZIL HCL 10 MG PO TABS: 10.0000 mg | ORAL_TABLET | Freq: Every day | ORAL | 4 refills | Status: DC

## 2022-01-13 NOTE — Progress Notes (Signed)
[facility]  PATIENT: Omar [name]. DOB: November 20, 1946  REFERRING DOCTOR OR PCP: Agustina Caroli MD SOURCE: Patient, notes from primary care, lab reports.  _________________________________   HISTORICAL  CHIEF COMPLAINT:  Chief Complaint  Patient presents with   Follow-up    Rm 1, w wife. Here for yearly f/u for dementia. Pt memory has worsened since last ov. Pt is repeatedly asking the same thing over and over. Some change in mood. MOCA:13    HISTORY OF PRESENT ILLNESS:  Omar [name]  is a 76 year old man with progressive memory decline for the past 1+ years.    Update 01/13/2022 He has had more meory problems.   He is taking donepezil and he thinks it has helped.   He tolerates it well.    Today he scored 13/30 on the MoCA.   He is driving and wife feels he is doing very well with directions, never getting lost.    He has no difficulty with iADL's.    His wife handles financial matter (he used to do).      His wife notes he does not remember details of conversations.    He is not remembering to take his medications.  He has no difficulty with receptive or expressive language.    He has reduced focus/attention .  He does chores aroubd the house (mow yard, wash car).      He uses GPS for places not familiar to him.  We discussed that there will be a time soon when driving will not be a good idea.   .    He denies any difficulty with sleep.  He sleeps 6 hours at night.   He also takes 2 one hour naps, often while watching TV.    He snores but has no OSA signs.  He sometimes gets out of bed to try to find items .  He denies depression or anxiety.  Physically he is doing well.   He has elevated cholesterol and mild GI issues (GERD and constipation).    TSH, CBCs with differential and CMP are normal.  His wife notes his first cousin has Alzheimer's disease.  He has noted some numbness in his feet for a couple years.  Balance is slightly off at times.   No falls.    He has lost weight and dronabinol was just added.     MoCA below.   He has a 9th grade education.  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        Montreal Cognitive Assessment  01/13/2022 01/13/2021 09/08/2020  Visuospatial/ Executive (0/5) 0 1 1  Naming (0/3) 3 3 3   Attention: Read list of digits (0/2) 1 2 1   Attention: Read list of letters (0/1) 0 1 1  Attention: Serial 7 subtraction starting at 100 (0/3) 1 1 1   Language: Repeat phrase (0/2) 1 0 1  Language : Fluency (0/1) 1 0 0  Abstraction (0/2) 1 1 1   Delayed Recall (0/5) 0 0 0  Orientation (0/6) 5 6 6   Total 13 15 15   Adjusted Score (based on education) - 16 16    MRI [facility] 09/23/2020 showed Scattered T2/FLAIR hyperintense foci consistent with mild to moderate chronic microvascular ischemic change.  None the foci appear to be acute.    Mild generalized cortical atrophy, slightly more than expected for age.  REVIEW OF SYSTEMS: Constitutional: No fevers, chills, sweats, or change in appetite.  He sleeps well most  nights. Eyes: No visual changes, double vision, eye pain Ear, nose and throat: No hearing loss, ear pain, nasal congestion, sore throat Cardiovascular: No chest pain, palpitations Respiratory:  No shortness of breath at rest or with exertion.   No wheezes GastrointestinaI: He notes GERD and constipation. Genitourinary:  No dysuria, urinary retention or frequency.  No nocturia. Musculoskeletal:  No neck pain, back pain Integumentary: No rash, pruritus, skin lesions Neurological: as above Psychiatric: No depression at this time.  No anxiety Endocrine: No palpitations, diaphoresis, change in appetite, change in weigh or increased thirst Hematologic/Lymphatic:  No anemia, purpura, petechiae. Allergic/Immunologic: No itchy/runny eyes, nasal congestion, recent allergic reactions, rashes  ALLERGIES: Allergies  Allergen Reactions   Sulfonamide Derivatives Other (See Comments)     blisters on skin    HOME  MEDICATIONS:  Current Outpatient Medications:    dronabinol (MARINOL) 5 MG capsule, Take 1 capsule (5 mg total) by mouth 2 (two) times daily before a meal., Disp: 60 capsule, Rfl: 3   OVER THE COUNTER MEDICATION, daily., Disp: , Rfl:    psyllium (METAMUCIL) 58.6 % powder, Take 1 packet by mouth daily., Disp: , Rfl:    rosuvastatin (CRESTOR) 20 MG tablet, Take 1 tablet (20 mg total) by mouth daily., Disp: 90 tablet, Rfl: 3   donepezil (ARICEPT) 10 MG tablet, Take 1 tablet (10 mg total) by mouth at bedtime., Disp: 90 tablet, Rfl: 4  Current Facility-Administered Medications:    0.9 %  sodium chloride infusion, 500 mL, Intravenous, Once, Nandigam, Venia Minks, MD  PAST MEDICAL HISTORY: Past Medical History:  Diagnosis Date   Anxiety    BPH (benign prostatic hypertrophy)    Decreased white blood cell count    Depression    Diverticulosis    Erectile dysfunction    Gastric polyps    GERD (gastroesophageal reflux disease)    History of colonic polyps    History of esophageal stricture 10/31/2007   History of kidney stones    History of prostate surgery    History of rheumatic fever    Hyperlipidemia    Hypertension    Prostate cancer ([place]) 11/09/2012   Prostatism     PAST SURGICAL HISTORY: Past Surgical History:  Procedure Laterality Date   CARPAL TUNNEL RELEASE     Bilateral   ESOPHAGEAL DILATION     last dilation 8'12   INGUINAL HERNIA REPAIR     INSERTION OF MESH N/A 04/15/2013   Procedure: INSERTION OF MESH;  Surgeon: Odis Hollingshead, MD;  Location: [place];  Service: General;  Laterality: N/A;   KNEE ARTHROSCOPY     left   LAPAROSCOPIC RIGHT HEMI COLECTOMY Right 08/31/2017   Procedure: LAPAROSCOPIC ASSISTED  RIGHT HEMI COLECTOMY;  Surgeon: Johnathan Hausen, MD;  Location: [facility];  Service: General;  Laterality: Right;   LEFT HEART CATHETERIZATION WITH CORONARY ANGIOGRAM N/A 07/18/2014   Procedure: LEFT HEART CATHETERIZATION WITH CORONARY ANGIOGRAM;  Surgeon: Burnell Blanks, MD;  Location: [facility] CATH LAB;  Service: Cardiovascular;  Laterality: N/A;   ROBOT ASSISTED LAPAROSCOPIC RADICAL PROSTATECTOMY  11/08/2012   Procedure: ROBOTIC ASSISTED LAPAROSCOPIC RADICAL PROSTATECTOMY LEVEL 1;  Surgeon: Molli Hazard, MD;  Location: [facility];  Service: Urology;  Laterality: N/A;      TONSILLECTOMY     UMBILICAL HERNIA REPAIR N/A 04/15/2013   Procedure: HERNIA REPAIR UMBILICAL ADULT;  Surgeon: Odis Hollingshead, MD;  Location: [place];  Service: General;  Laterality: N/A;    FAMILY HISTORY: Family History  Problem  Relation Age of Onset   Ovarian cancer Mother    Colon cancer Mother 54   High Cholesterol Mother    Stroke Father    Lung cancer Maternal Uncle    Lung cancer Maternal Aunt    Esophageal cancer Neg Hx    Stomach cancer Neg Hx    Rectal cancer Neg Hx    Pancreatic cancer Neg Hx    Liver disease Neg Hx     SOCIAL HISTORY:  Social History   Socioeconomic History   Marital status: Married    Spouse name: Not on file   Number of children: 4   Years of education: Not on file   Highest education level: Not on file  Occupational History   Occupation: Theme park manager    Employer: [employer]  Tobacco Use   Smoking status: Former    Packs/day: 1.00    Years: 25.00    Pack years: 25.00    Types: Cigarettes    Quit date: 12/05/1993    Years since quitting: 28.1   Smokeless tobacco: Never  Vaping Use   Vaping Use: Never used  Substance and Sexual Activity   Alcohol use: No   Drug use: No   Sexual activity: Yes  Other Topics Concern   Not on file  Social History Narrative   Lives with wife in a one story home.  Has 4 children.  Works as a Theme park manager.     Education: 10th grade.    Caffeine use: soda (one per day)   Right handed   Social Determinants of Health   Financial Resource Strain: Low Risk    Difficulty of Paying Living Expenses: Not hard at all  Food Insecurity: No Food Insecurity   Worried About Charity fundraiser  in the Last Year: Never true   Ran Out of Food in the Last Year: Never true  Transportation Needs: No Transportation Needs   Lack of Transportation (Medical): No   Lack of Transportation (Non-Medical): No  Physical Activity: Sufficiently Active   Days of Exercise per Week: 5 days   Minutes of Exercise per Session: 30 min  Stress: No Stress Concern Present   Feeling of Stress : Not at all  Social Connections: Socially Integrated   Frequency of Communication with Friends and Family: More than three times a week   Frequency of Social Gatherings with Friends and Family: More than three times a week   Attends Religious Services: More than 4 times per year   Active Member of [employer] or Organizations: Yes   Attends Music therapist: More than 4 times per year   Marital Status: Married  Human resources officer Violence: Not At Risk   Fear of Current or Ex-Partner: No   Emotionally Abused: No   Physically Abused: No   Sexually Abused: No     PHYSICAL EXAM  Vitals:   01/13/22 1038  BP: (!) 146/64  Pulse: 65  Weight: 174 lb 8 oz (79.2 kg)  Height: 6\' 3"  (1.905 m)    Body mass index is 21.81 kg/m.   General: The patient is well-developed and well-nourished and in no acute distress  HEENT:  Head is [place]/AT.  Sclera are anicteric.    Skin: Extremities are without rash or edema.  Musculoskeletal:  Back is nontender  Neurologic Exam  Mental status: The patient is alert and oriented x 3 at the time of the examination.  He had reduced short-term memory (0/5), reduced visual spatial/executive function and  reduced focus/attention.  His Montreal cognitive assessment score was 16/30.   Speech is normal.  Cranial nerves: Extraocular movements are full.   Facial strength and sensation was normal.  No obvious hearing deficits are noted.  Motor:  Muscle bulk is normal.   Tone is normal. Strength is  5 / 5 in all 4 extremities.   Sensory: Sensory testing is intact to pinprick, soft  touch and vibration sensation in the arms and knees.  He had about 20% reduced vibration sensation at the ankles and 75% reduced vibration sensation at the toes.  He also had reduced sensation to touch/pain in the feet more than the lower legs.  Coordination: Cerebellar testing reveals good finger-nose-finger and heel-to-shin bilaterally.  Gait and station: Station is normal.   Gait is normal for his age and he can turn 180 degrees in 3 steps.. Tandem gait is moderately wide. Romberg is negative.   Reflexes: Deep tendon reflexes are 1 and symmetric in the arms and knees and absent at the ankles.     DIAGNOSTIC DATA (LABS, IMAGING, TESTING) - I reviewed patient records, labs, notes, testing and imaging myself where available.  Lab Results  Component Value Date   WBC 2.4 (L) 07/16/2021   HGB 14.1 07/16/2021   HCT 43.6 07/16/2021   HCT 42.4 07/16/2021   MCV 86.7 07/16/2021   PLT 169 07/16/2021      Component Value Date/Time   NA 140 11/01/2021 1114   NA 141 05/26/2020 1506   K 4.3 11/01/2021 1114   CL 102 11/01/2021 1114   CO2 32 11/01/2021 1114   GLUCOSE 88 11/01/2021 1114   BUN 14 11/01/2021 1114   BUN 16 05/26/2020 1506   CREATININE 1.13 11/01/2021 1114   CALCIUM 9.4 11/01/2021 1114   PROT 6.3 11/01/2021 1114   PROT 6.4 09/08/2020 1134   ALBUMIN 4.2 11/01/2021 1114   ALBUMIN 4.3 05/26/2020 1506   AST 16 11/01/2021 1114   ALT 20 11/01/2021 1114   ALKPHOS 62 11/01/2021 1114   BILITOT 1.0 11/01/2021 1114   BILITOT 0.5 05/26/2020 1506   GFRNONAA 74 05/26/2020 1506   GFRAA 85 05/26/2020 1506   Lab Results  Component Value Date   CHOL 164 11/01/2021   HDL 67.20 11/01/2021   LDLCALC 81 11/01/2021   LDLDIRECT 114.0 08/16/2016   TRIG 80.0 11/01/2021   CHOLHDL 2 11/01/2021   Lab Results  Component Value Date   HGBA1C 5.5 08/31/2017   Lab Results  Component Value Date   VITAMINB12 849 07/16/2021   Lab Results  Component Value Date   TSH 1.09 06/17/2021        ASSESSMENT AND PLAN  Dementia without behavioral disturbance (HCC)  Polyneuropathy  Gait disturbance  Numbness   1.  His dementia is just mildly worse than last year.  He is not having much behavioral problems though sometimes is frustrated.  He likely has Alzheimer's disease.  He is tolerating donepezil well and will continue.  We could consider adding Namenda in the future though the benefit is often minimal at best.  I also discussed that aside participation several research studies for Alzheimer's and he may be eligible 2.   Stay active as he is doing 3.   We had a discussion about his driving.  He does not go longer distances without his wife in the car.  He should not drive without somebody else in the car or in bad weather or nighttime or [place] distances.  If he has  an episode of getting lost while driving or if judgment seems to worsen, then he needs to quit completely  4.   Return as needed.  40 minute office visit with the majority of the time spent face-to-face for history and physical, discussion/counseling and decision-making.  Additional time with record review and documentation.   Kalynne Womac A. Felecia Shelling, MD, [facility] 08/11/8863, 8:47 PM Certified in Neurology, Clinical Neurophysiology, Sleep Medicine and Neuroimaging  [facility] [address] [place], [place] [address] [phone]

## 2022-01-18 ENCOUNTER — Ambulatory Visit (HOSPITAL_COMMUNITY): Payer: Medicare Other | Admitting: Hematology

## 2022-01-25 ENCOUNTER — Ambulatory Visit (INDEPENDENT_AMBULATORY_CARE_PROVIDER_SITE_OTHER): Payer: Medicare Other | Admitting: *Deleted

## 2022-01-25 DIAGNOSIS — E785 Hyperlipidemia, unspecified: Secondary | ICD-10-CM

## 2022-01-25 DIAGNOSIS — F0390 Unspecified dementia without behavioral disturbance: Secondary | ICD-10-CM

## 2022-01-25 NOTE — Chronic Care Management (AMB) (Signed)
Chronic Care Management   CCM RN Visit Note  01/25/2022 Name: Omar [name]. MRN: [id number] DOB: 1946/10/17  Subjective: Omar [name]. is a 76 y.o. year old male who is a primary care patient of Omar [name], Omar Bloomer, MD. The care management team was consulted for assistance with disease management and care coordination needs.    Engaged with patient and his spouse-caregiver Vaughan Basta, on [facility] DPR by telephone for follow up visit in response to provider referral for case management and/or care coordination services.   Consent to Services:  The patient was given information about Chronic Care Management services, agreed to services, and gave verbal consent prior to initiation of services.  Please see initial visit note for detailed documentation.  Patient agreed to services and verbal consent obtained.   Assessment: Review of patient past medical history, allergies, medications, health status, including review of consultants reports, laboratory and other test data, was performed as part of comprehensive evaluation and provision of chronic care management services.   SDOH (Social Determinants of Health) assessments and interventions performed:  SDOH Interventions    Flowsheet Row Most Recent Value  SDOH Interventions   Food Insecurity Interventions Intervention Not Indicated  [Continues to deny food insecurity]  Housing Interventions Intervention Not Indicated  Transportation Interventions Intervention Not Indicated  [Reports continues driving self- but only with wofe present in vehicle]      CCM Care Plan  Allergies  Allergen Reactions   Sulfonamide Derivatives Other (See Comments)     blisters on skin   Outpatient Encounter Medications as of 01/25/2022  Medication Sig Note   donepezil (ARICEPT) 10 MG tablet Take 1 tablet (10 mg total) by mouth at bedtime.    dronabinol (MARINOL) 5 MG capsule Take 1 capsule (5 mg total) by mouth 2 (two) times daily before a meal.    OVER THE  COUNTER MEDICATION daily. 01/21/2020: As needed Premier protein   psyllium (METAMUCIL) 58.6 % powder Take 1 packet by mouth daily.    rosuvastatin (CRESTOR) 20 MG tablet Take 1 tablet (20 mg total) by mouth daily.    [DISCONTINUED] vardenafil (LEVITRA) 20 MG tablet Take 20 mg by mouth as needed.      Facility-Administered Encounter Medications as of 01/25/2022  Medication   0.9 %  sodium chloride infusion   Patient Active Problem List   Diagnosis Date Noted   Essential hypertension 11/01/2021   History of colon cancer 06/17/2021   Lipoma of anterior chest wall 04/29/2021   Snoring 01/13/2021   Dementia without behavioral disturbance ([place]) 01/13/2021   Excessive daytime sleepiness 01/13/2021   Memory loss 09/08/2020   Numbness 09/08/2020   Gait disturbance 09/08/2020   Diverticulosis 04/04/2019   Internal hemorrhoids 04/04/2019   Loss of weight 01/01/2019   Dysplastic mass of the cecum s/p lap right colectomy 08/31/2017 09/02/2017   S/P right colectomy 08/31/2017   Polyneuropathy 11/21/2016   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   History of pulmonary embolism 07/18/2014   Adrenal adenoma 03/18/2014   Coronary atherosclerosis of native coronary artery 03/18/2014   Hyperlipidemia 11/13/2013   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/2013   Prostate cancer ([place]) 11/09/2012   Stricture and stenosis of esophagus 05/31/2012   Leukopenia 05/27/2009   Esophageal reflux 10/09/2007   Dyslipidemia 07/16/2007   Depression 07/16/2007   COLONIC POLYPS, HX OF 07/16/2007   Conditions to be addressed/monitored:  HLD and Dementia  Care Plan : RN Care Manager Plan of Care  Updates made by Janae Bonser,  Lenon Oms, RN since 01/25/2022 12:00 AM     Problem: Chronic Disease management Needs   Priority: Medium     [place] Goal: Ongoing adherence to established plan of care for [place] term chronic disease management   Start Date: 07/09/2021  Expected End Date: 07/09/2022  Priority: Medium   Note:   Current Barriers:  Chronic Disease Management support and education needs related to HLD and memory loss/ dementia Recent weight loss of ~35 pounds in 4 months, per patient report  RNCM Clinical Goal(s):  Patient will demonstrate ongoing adherence to prescribed treatment plan for HLD and memory loss as evidenced by adherence to prescribed medication regimen contacting provider for new or worsened symptoms or questions attending all provider appointments, ongoing adherence to diet low in cholesterol  through collaboration with RN Care manager, provider, and care team.   Interventions: 1:1 collaboration with primary care provider regarding development and update of comprehensive plan of care as evidenced by provider attestation and co-signature Inter-disciplinary care team collaboration (see longitudinal plan of care) Evaluation of current treatment plan related to  self management and patient's adherence to plan as established by provider; 01/25/22: SDOH updated: no new/ unmet needs identified Pain assessment updated: patient continues to deny acute/ chronic pain Falls assessment updated: patient/ spouse-caregiver deny new/ recent falls; reports patient is "steady on feet" with ambulation; does not use assistive devices; positive reinforcement provided with encouragement to continue efforts at fall prevention Confirmed no new/ recent medication changes: patient/ spouse continue to report ongoing adherence to medication regimen; spouse continues managing medications for patient Encouraged patient/ spouse to schedule next PCP office visit, as advised at time of last office visit in November 2022-- next visit due late May/ early June; they report they will schedule accordingly  Hyperlipidemia:  (Status: 01/25/22: Goal on Track (progressing): YES.) [place] Term Goal Lab Results  Component Value Date   CHOL 164 11/01/2021   HDL 67.20 11/01/2021   [place] 81 11/01/2021   LDLDIRECT 114.0  08/16/2016   TRIG 80.0 11/01/2021   CHOLHDL 2 11/01/2021    Counseled on importance of regular laboratory monitoring as prescribed; Reviewed importance of limiting foods high in cholesterol; Reviewed exercise goals and target of 150 minutes per week; Confirmed appetite better with use of marinol-- reports "eating good" Confirmed patient believes his recently reported weight loss has stabilized: reports consistent weights between "177-179 lbs" Confirmed patient continues trying to follow low salt, low cholesterol, heart healthy diet; positive reinforcement provided with encouragement to continue efforts  Memory Loss  (Status: 01/25/22: Goal on Track (progressing): YES.) [place] Term Goal Evaluation of current treatment plan related to  memory loss/ dementia ,  self-management and patient's adherence to plan as established by provider Discussed plans with patient for ongoing care management follow up and provided patient with direct contact information for care management team Reviewed recent neurology provider office visit with patient and spouse: reinforced instructions provided then that patient should not be driving without someone in vehicle with him: they are complying with these instructions Provided education around strategies patient/ spouse-caregiver can adopt to manage memory issues at home-- will provide additional printed information as well Confirmed no new/ worsening issues around memory/ memory management- patient and spouse report "he is about the same;" encouraged them to contact neurology/ PCP providers as needed for any worsening or concerns- they verbalize understanding and agreement with this plan  Patient Goals/Self-Care Activities: As evidenced by review of EHR, collaboration with care team, and patient reporting during CCM  RN CM outreach,  Patient Demontray, "Richard" will:                 Take medications as prescribed Attend all scheduled provider appointments Call pharmacy  for medication refills Call provider office for new concerns or questions Continue to follow heart healthy, low salt, low cholesterol diet Stay as active as possible around weather conditions Continue to have someone present with you when you drive It is time to make your next appointment with Dr. Mitchel [name]- next appointment due late May or early June 2023 Continue your efforts to prevent falls    Plan: Telephone follow up appointment with care management team member scheduled for:  Monday, May 23, 2022 at 11:30 am The patient has been provided with contact information for the care management team and has been advised to call with any health related questions or concerns  Oneta Rack, RN, BSN, [place] [phone]: direct office

## 2022-01-25 NOTE — Patient Instructions (Signed)
Visit Information  Marte, "Richard" and Vaughan Basta, thank you for taking time to talk with me today about Richard's health care needs. Please don't hesitate to contact me if I can be of assistance to you before our next scheduled telephone appointment.  Below are the goals we discussed today:  Patient Self-Care Activities: Patient Onur, Mori" will:                 Take medications as prescribed Attend all scheduled provider appointments Call pharmacy for medication refills Call provider office for new concerns or questions Continue to follow heart healthy, low salt, low cholesterol diet Stay as active as possible around weather conditions Continue to have someone present with you when you drive It is time to make your next appointment with Dr. Mitchel Honour- next appointment due late May or early June 2023 Continue your efforts to prevent falls  Our next scheduled telephone follow up visit/ appointment with care management team member is scheduled on:   Monday, May 23, 2022 at 11:30 am- This is a PHONE CALL appointment  If you need to cancel or re-schedule our visit, please call [phone] and our care guide team will be happy to assist you.   I look forward to hearing about your progress.   Oneta Rack, RN, BSN, [place] [phone]: direct office  If you are experiencing a Mental Health or [place] or need someone to talk to, please  call the Suicide and Crisis Lifeline: 988 call the [place] National Suicide Prevention Lifeline: 270-728-4697 or TTY: 8380204231 TTY 7022940660) to talk to a trained counselor call 1-800-273-TALK (toll free, 24 hour hotline) go to [facility] [place] [phone]) call the [place]: [phone] call 911   The patient verbalized understanding of instructions, educational materials, and  care plan provided today and agreed to receive a mailed copy of patient instructions, educational materials, and care plan  Management of Memory Problems  There are some general things you can do to help manage your memory problems.  Your memory may not in fact recover, but by using techniques and strategies you will be able to manage your memory difficulties better.  1)  Establish a routine. Try to establish and then stick to a regular routine.  By doing this, you will get used to what to expect and you will reduce the need to rely on your memory.  Also, try to do things at the same time of day, such as taking your medication or checking your calendar first thing in the morning. Think about think that you can do as a part of a regular routine and make a list.  Then enter them into a daily planner to remind you.  This will help you establish a routine.  2)  Organize your environment. Organize your environment so that it is uncluttered.  Decrease visual stimulation.  Place everyday items such as keys or cell phone in the same place every day (ie.  Basket next to front door) Use post it notes with a brief message to yourself (ie. Turn off light, lock the door) Use labels to indicate where things go (ie. Which cupboards are for food, dishes, etc.) Keep a notepad and pen by the telephone to take messages  3)  Memory Aids A diary or journal/notebook/daily planner Making a list (shopping list, chore list, to do list that needs to be done) Using an  alarm as a reminder (kitchen timer or cell phone alarm) Using cell phone to store information (Notes, Calendar, Reminders) Calendar/White board placed in a prominent position Post-it notes  In order for memory aids to be useful, you need to have good habits.  It's no good remembering to make a note in your journal if you don't remember to look in it.  Try setting aside a certain time of day to look in journal.  4)  Improving mood and managing  fatigue. There may be other factors that contribute to memory difficulties.  Factors, such as anxiety, depression and tiredness can affect memory. Regular gentle exercise can help improve your mood and give you more energy. Simple relaxation techniques may help relieve symptoms of anxiety Try to get back to completing activities or hobbies you enjoyed doing in the past. Learn to pace yourself through activities to decrease fatigue. Find out about some local support groups where you can share experiences with others. Try and achieve 7-8 hours of sleep at night.

## 2022-02-01 DIAGNOSIS — E785 Hyperlipidemia, unspecified: Secondary | ICD-10-CM

## 2022-02-01 DIAGNOSIS — F0390 Unspecified dementia without behavioral disturbance: Secondary | ICD-10-CM | POA: Diagnosis not present

## 2022-04-03 ENCOUNTER — Other Ambulatory Visit: Payer: Self-pay | Admitting: Emergency Medicine

## 2022-04-03 DIAGNOSIS — E785 Hyperlipidemia, unspecified: Secondary | ICD-10-CM

## 2022-04-17 ENCOUNTER — Encounter: Payer: Self-pay | Admitting: Internal Medicine

## 2022-04-17 NOTE — Progress Notes (Signed)
? ? ?Subjective:  ? ? Patient ID: Omar [name]., male    DOB: 1946-04-23, 76 y.o.   MRN: [id number] ? ? ? ? ? ?HPI ?Omar [name] is here for  ?Chief Complaint  ?Patient presents with  ? Weight Loss  ? ? ? ?Weight loss - he is here with his wife for weight loss.  He does not weigh himself at home.  They are here because he feels he has lost weight.  He feels his clothes are fitting differently. ? ? ?He is taking marinol once a day.  He forgets to take it later in the day.  ? ?He eats a good breakfast.  He does not eat lunch - but an early dinner.  He typically does not snack.  Both him and his wife feel he is eating a good amount of food with each meal. ? ? ? ? ?Medications and allergies reviewed with patient and updated if appropriate. ? ?Current Outpatient Medications on File Prior to Visit  ?Medication Sig Dispense Refill  ? atorvastatin (LIPITOR) 40 MG tablet Take 1 tablet (40 mg total) by mouth daily. 90 tablet 3  ? donepezil (ARICEPT) 10 MG tablet Take 1 tablet (10 mg total) by mouth at bedtime. 90 tablet 4  ? dronabinol (MARINOL) 5 MG capsule Take 1 capsule (5 mg total) by mouth 2 (two) times daily before a meal. 60 capsule 3  ? OVER THE COUNTER MEDICATION daily.    ? psyllium (METAMUCIL) 58.6 % powder Take 1 packet by mouth daily.    ? [DISCONTINUED] vardenafil (LEVITRA) 20 MG tablet Take 20 mg by mouth as needed.      ? ?No current facility-administered medications on file prior to visit.  ? ? ?Review of Systems  ?Constitutional:  Positive for diaphoresis. Negative for appetite change (good) and fever.  ?Respiratory:  Negative for cough, shortness of breath and wheezing.   ?Cardiovascular:  Negative for chest pain, palpitations and leg swelling.  ?Gastrointestinal:  Positive for constipation. Negative for abdominal pain, blood in stool, diarrhea and nausea.  ?     Occ indigestion  ?Genitourinary:  Negative for dysuria and hematuria.  ?Neurological:  Negative for light-headedness and headaches.  ? ?    ?Objective:  ? ?Vitals:  ? 04/18/22 1321  ?BP: 118/78  ?Pulse: 86  ?Temp: 98.6 ?F (37 ?C)  ?SpO2: 98%  ? ?BP Readings from Last 3 Encounters:  ?04/18/22 118/78  ?01/13/22 (!) 146/64  ?11/01/21 130/70  ? ?Wt Readings from Last 3 Encounters:  ?04/18/22 175 lb 3.2 oz (79.5 kg)  ?01/13/22 174 lb 8 oz (79.2 kg)  ?11/01/21 177 lb (80.3 kg)  ? ?Body mass index is 21.9 kg/m?. ? ?  ?Physical Exam ?Constitutional:   ?   General: He is not in acute distress. ?   Appearance: Normal appearance. He is not ill-appearing.  ?HENT:  ?   Head: Normocephalic and atraumatic.  ?Eyes:  ?   Conjunctiva/sclera: Conjunctivae normal.  ?Cardiovascular:  ?   Rate and Rhythm: Normal rate and regular rhythm.  ?   Heart sounds: Normal heart sounds. No murmur heard. ?Pulmonary:  ?   Effort: Pulmonary effort is normal. No respiratory distress.  ?   Breath sounds: Normal breath sounds. No wheezing or rales.  ?Musculoskeletal:  ?   Right lower leg: No edema.  ?   Left lower leg: No edema.  ?Skin: ?   General: Skin is warm and dry.  ?   Findings: No rash.  ?  Neurological:  ?   Mental Status: He is alert. Mental status is at baseline.  ?Psychiatric:     ?   Mood and Affect: Mood normal.  ? ?   ? ? ? ? ? ?Assessment & Plan:  ? ? ?Loss of weight: ?Subacute ?He has had weight loss compared to 1 year ago, but over the past 6 months looking at the weights in epic his weight has been fairly stable ?He is taking Marinol 5 mg once daily-it is prescribed for twice daily with his meals, but he and his wife forget for him to take it in the evening ?Encouraged him to find a way to remind themselves for him to take in the evening, which may help.  They can try an alarm on your phone or putting the medication out someplace where they can see it ?Discussed trying to happen do some snacks during the day ?Discussed that they could consider buying a scale so that they can monitor his weight at home, but his weights in epic over the past several months are very reassuring  that he is not losing significant weight ?Continue Marinol 5 mg twice daily ?Follow-up as scheduled ? ? ? ? ?

## 2022-04-18 ENCOUNTER — Ambulatory Visit (INDEPENDENT_AMBULATORY_CARE_PROVIDER_SITE_OTHER): Payer: Medicare Other | Admitting: Internal Medicine

## 2022-04-18 VITALS — BP 118/78 | HR 86 | Temp 98.6°F | Ht 75.0 in | Wt 175.2 lb

## 2022-04-18 DIAGNOSIS — R634 Abnormal weight loss: Secondary | ICD-10-CM | POA: Diagnosis not present

## 2022-04-18 NOTE — Patient Instructions (Signed)
    Medications changes include :   none    

## 2022-05-23 ENCOUNTER — Telehealth: Payer: Medicare Other

## 2022-05-23 ENCOUNTER — Telehealth: Payer: Self-pay | Admitting: *Deleted

## 2022-05-23 ENCOUNTER — Encounter: Payer: Self-pay | Admitting: *Deleted

## 2022-05-23 NOTE — Telephone Encounter (Signed)
  Chronic Care Management   Follow Up Note   05/23/2022 Name: Omar [name]. MRN: [id number] DOB: [birth year]  Referred by: Horald Pollen, MD Reason for referral : Chronic Care Management (CCM RN CM Follow Up Telephone Visit- unsuccessful attempt)  An unsuccessful telephone outreach was attempted today. The patient was referred to the case management team for assistance with care management and care coordination.   Follow Up Plan:  With call attempt today, received automated outgoing voice message stating that patient's phone number has a voice mail box that is full; unable to leave voice message msg requesting call-back Will place request with scheduling care guide to contact patient to re-schedule today's missed CCM RN follow up telephone appointment if I do not hear back from patient by end of day  Oneta Rack, RN, BSN, [place] [phone]: direct office

## 2022-06-01 ENCOUNTER — Telehealth: Payer: Self-pay | Admitting: Emergency Medicine

## 2022-06-01 NOTE — Telephone Encounter (Signed)
Pt called to reschedule CCM Telephone call.

## 2022-06-03 ENCOUNTER — Telehealth: Payer: Self-pay | Admitting: *Deleted

## 2022-06-03 NOTE — Chronic Care Management (AMB) (Unsigned)
  Chronic Care Management Note  06/03/2022 Name: Omar [name]. MRN: [id number] DOB: [birth year]  Omar [name]. is a 76 y.o. year old male who is a primary care patient of Horald Pollen, MD and is actively engaged with the care management team. I reached out to Omar [name]. by phone today to assist with re-scheduling a follow up visit with the RN Case Manager  Follow up plan: Unsuccessful telephone outreach attempt made. The care management team will reach out to the patient again over the next 7 days. If patient returns call to provider office, please advise to call [place]  at [phone].  [place] Management  Direct Dial: [phone]

## 2022-06-06 NOTE — Chronic Care Management (AMB) (Signed)
  Chronic Care Management Note  06/06/2022 Name: Omar [name]. MRN: [id number] DOB: 1946-07-17  Omar [name]. is a 76 y.o. year old male who is a primary care patient of Horald Pollen, MD and is actively engaged with the care management team. I reached out to Omar [name]. by phone today to assist with re-scheduling a follow up visit with the RN Case Manager  Follow up plan: Telephone appointment with care management team member scheduled for:06/08/22  [place], [place] Management  Direct Dial: [phone]

## 2022-06-08 ENCOUNTER — Ambulatory Visit (INDEPENDENT_AMBULATORY_CARE_PROVIDER_SITE_OTHER): Payer: Medicare Other | Admitting: *Deleted

## 2022-06-08 DIAGNOSIS — E785 Hyperlipidemia, unspecified: Secondary | ICD-10-CM

## 2022-06-08 DIAGNOSIS — F0390 Unspecified dementia without behavioral disturbance: Secondary | ICD-10-CM

## 2022-06-08 NOTE — Chronic Care Management (AMB) (Signed)
Chronic Care Management   CCM RN Visit Note  06/08/2022 Name: Omar [name]. MRN: [id number] DOB: 1946-01-19  Subjective: Omar [name]. is a 76 y.o. year old male who is a primary care patient of Mitchel Honour, Ines Bloomer, MD. The care management team was consulted for assistance with disease management and care coordination needs.    Engaged with patient by telephone for follow up visit/ CCM RN CM case closure in response to provider referral for case management and/or care coordination services.   Consent to Services:  The patient was given information about Chronic Care Management services, agreed to services, and gave verbal consent prior to initiation of services.  Please see initial visit note for detailed documentation.  Patient agreed to services and verbal consent obtained.   Assessment: Review of patient past medical history, allergies, medications, health status, including review of consultants reports, laboratory and other test data, was performed as part of comprehensive evaluation and provision of chronic care management services.   SDOH (Social Determinants of Health) assessments and interventions performed:  SDOH Interventions    Flowsheet Row Most Recent Value  SDOH Interventions   Food Insecurity Interventions Intervention Not Indicated  [continues to deny food insecurity]  Transportation Interventions Intervention Not Indicated  [continues to drive self]     CCM Care Plan  Allergies  Allergen Reactions   Sulfonamide Derivatives Other (See Comments)     blisters on skin   Outpatient Encounter Medications as of 06/08/2022  Medication Sig Note   atorvastatin (LIPITOR) 40 MG tablet Take 1 tablet (40 mg total) by mouth daily.    donepezil (ARICEPT) 10 MG tablet Take 1 tablet (10 mg total) by mouth at bedtime.    dronabinol (MARINOL) 5 MG capsule Take 1 capsule (5 mg total) by mouth 2 (two) times daily before a meal.    OVER THE COUNTER MEDICATION daily. 01/21/2020:  As needed Premier protein   psyllium (METAMUCIL) 58.6 % powder Take 1 packet by mouth daily.    [DISCONTINUED] vardenafil (LEVITRA) 20 MG tablet Take 20 mg by mouth as needed.      No facility-administered encounter medications on file as of 06/08/2022.   Patient Active Problem List   Diagnosis Date Noted   Essential hypertension 11/01/2021   History of colon cancer 06/17/2021   Lipoma of anterior chest wall 04/29/2021   Snoring 01/13/2021   Dementia without behavioral disturbance ([place]) 01/13/2021   Excessive daytime sleepiness 01/13/2021   Memory loss 09/08/2020   Numbness 09/08/2020   Gait disturbance 09/08/2020   Diverticulosis 04/04/2019   Internal hemorrhoids 04/04/2019   Loss of weight 01/01/2019   Dysplastic mass of the cecum s/p lap right colectomy 08/31/2017 09/02/2017   S/P right colectomy 08/31/2017   Polyneuropathy 11/21/2016   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   History of pulmonary embolism 07/18/2014   Adrenal adenoma 03/18/2014   Coronary atherosclerosis of native coronary artery 03/18/2014   Hyperlipidemia 11/13/2013   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/2013   Prostate cancer ([place]) 11/09/2012   Stricture and stenosis of esophagus 05/31/2012   Leukopenia 05/27/2009   Esophageal reflux 10/09/2007   Dyslipidemia 07/16/2007   Depression 07/16/2007   COLONIC POLYPS, HX OF 07/16/2007   Conditions to be addressed/monitored:  HLD and Dementia  Care Plan : RN Care Manager Plan of Care  Updates made by Knox Royalty, RN since 06/08/2022 12:00 AM     Problem: Chronic Disease management Needs   Priority: Medium  [place] Goal: Ongoing adherence to established plan of care for [place] term chronic disease management   Start Date: 07/09/2021  Expected End Date: 07/09/2022  Priority: Medium  Note:   Current Barriers:  Chronic Disease Management support and education needs related to HLD and memory loss/ dementia Recent weight loss of ~35  pounds in 4 months, per patient report at [facility] initial assessment  RNCM Clinical Goal(s):  Patient will demonstrate ongoing adherence to prescribed treatment plan for HLD and memory loss as evidenced by adherence to prescribed medication regimen contacting provider for new or worsened symptoms or questions attending all provider appointments, ongoing adherence to diet low in cholesterol  through collaboration with RN Care manager, provider, and care team.   Interventions: 1:1 collaboration with primary care provider regarding development and update of comprehensive plan of care as evidenced by provider attestation and co-signature Inter-disciplinary care team collaboration (see longitudinal plan of care) Evaluation of current treatment plan related to  self management and patient's adherence to plan as established by provider Review of patient status, including review of consultants reports, relevant laboratory and other test results, and medications completed SDOH updated: no new/ unmet concerns identified Pain assessment updated: continues to deny pain Falls assessment updated: continues to deny new/ recent falls x 12 months- currently not using assistive devices; positive reinforcement provided with encouragement to continue efforts at fall prevention; previously provided education around fall risks/ prevention reinforced Medications discussed: reports spouse continues to assist in medication management and denies current concerns/ issues/ questions around medications; endorses adherence to taking all medications as prescribed Reviewed upcoming scheduled provider appointments: 06/09/22- PCP; patient confirms is aware of all and has plans to attend as scheduled Discussed plans with patient for ongoing care management follow up- patient denies current care coordination/ care management needs and is agreeable to CCM RN CM case closure today; verbalizes understanding to contact PCP or other care  providers for any needs that arise in the future, and confirms he has contact information for all care providers     Hyperlipidemia:  (Status: 06/08/22: Goal Met.) [place] Term Goal Lab Results  Component Value Date   CHOL 164 11/01/2021   HDL 67.20 11/01/2021   [place] 81 11/01/2021   LDLDIRECT 114.0 08/16/2016   TRIG 80.0 11/01/2021   CHOLHDL 2 11/01/2021    Counseled on importance of regular laboratory monitoring as prescribed; Reviewed importance of limiting foods high in cholesterol; Confirmed appetite "okay;" however he continues to verbalize concern around ongoing weight loss; reviewed recent PCP office visit with covering provider: she noted no significant weight loss from prior office visits; advised to discuss his ongoing concern with PCP during office visit tomorrow; continues to report "eating good" Confirmed patient continues trying to follow low salt, low cholesterol, heart healthy diet; positive reinforcement provided with encouragement to continue efforts  Memory Loss  (Status: 06/08/22: Goal Met.) [place] Term Goal Evaluation of current treatment plan related to  memory loss/ dementia ,  self-management and patient's adherence to plan as established by provider Reinforced previously provided education around strategies patient/ spouse-caregiver for self management memory issues at home; patient believes he has not ongoing progression and continues to report spouse assists with memory management; patient continues to report he is driving and spouse is "always with him in the car" Encouraged them to contact neurology/ PCP providers as needed for any worsening or concerns- they verbalize understanding and agreement with this plan     Plan: No further follow up required:  patient denies current care coordination/ care management needs and is agreeable to CCM RN CM case closure today; CCM RN CM case closure accordingly     Oneta Rack, RN, BSN, [facility] RN Care  Coordination- [place] [phone]: direct office

## 2022-06-09 ENCOUNTER — Ambulatory Visit (INDEPENDENT_AMBULATORY_CARE_PROVIDER_SITE_OTHER): Payer: Medicare Other | Admitting: Emergency Medicine

## 2022-06-09 ENCOUNTER — Encounter: Payer: Self-pay | Admitting: Emergency Medicine

## 2022-06-09 VITALS — BP 132/74 | HR 70 | Temp 98.2°F | Ht 75.0 in | Wt 173.1 lb

## 2022-06-09 DIAGNOSIS — I10 Essential (primary) hypertension: Secondary | ICD-10-CM | POA: Diagnosis not present

## 2022-06-09 DIAGNOSIS — D72819 Decreased white blood cell count, unspecified: Secondary | ICD-10-CM | POA: Diagnosis not present

## 2022-06-09 DIAGNOSIS — F0390 Unspecified dementia without behavioral disturbance: Secondary | ICD-10-CM | POA: Diagnosis not present

## 2022-06-09 DIAGNOSIS — E785 Hyperlipidemia, unspecified: Secondary | ICD-10-CM

## 2022-06-09 DIAGNOSIS — R634 Abnormal weight loss: Secondary | ICD-10-CM | POA: Diagnosis not present

## 2022-06-09 NOTE — Assessment & Plan Note (Signed)
Stable.  Diet and nutrition discussed.  Recommend to continue atorvastatin 40 mg daily.

## 2022-06-09 NOTE — Assessment & Plan Note (Signed)
Well-controlled hypertension.  Off medications.

## 2022-06-09 NOTE — Assessment & Plan Note (Signed)
Stable.  Well-controlled. Continue Aricept 10 mg at bedtime. Most recent neurology office visit notes reviewed.

## 2022-06-09 NOTE — Assessment & Plan Note (Signed)
Stable without further loss of weight. Advised to continue Marinol 5 mg twice a day before meals.

## 2022-06-09 NOTE — Patient Instructions (Signed)
Health Maintenance After Age 76 After age 76, you are at a higher risk for certain [place]-term diseases and infections as well as injuries from falls. Falls are a major cause of broken bones and head injuries in people who are older than age 76. Getting regular preventive care can help to keep you healthy and well. Preventive care includes getting regular testing and making lifestyle changes as recommended by your health care provider. Talk with your health care provider about: Which screenings and tests you should have. A screening is a test that checks for a disease when you have no symptoms. A diet and exercise plan that is right for you. What should I know about screenings and tests to prevent falls? Screening and testing are the best ways to find a health problem early. Early diagnosis and treatment give you the best chance of managing medical conditions that are common after age 76. Certain conditions and lifestyle choices may make you more likely to have a fall. Your health care provider may recommend: Regular vision checks. Poor vision and conditions such as cataracts can make you more likely to have a fall. If you wear glasses, make sure to get your prescription updated if your vision changes. Medicine review. Work with your health care provider to regularly review all of the medicines you are taking, including over-the-counter medicines. Ask your health care provider about any side effects that may make you more likely to have a fall. Tell your health care provider if any medicines that you take make you feel dizzy or sleepy. Strength and balance checks. Your health care provider may recommend certain tests to check your strength and balance while standing, walking, or changing positions. Foot health exam. Foot pain and numbness, as well as not wearing proper footwear, can make you more likely to have a fall. Screenings, including: Osteoporosis screening. Osteoporosis is a condition that causes  the bones to get weaker and break more easily. Blood pressure screening. Blood pressure changes and medicines to control blood pressure can make you feel dizzy. Depression screening. You may be more likely to have a fall if you have a fear of falling, feel depressed, or feel unable to do activities that you used to do. Alcohol use screening. Using too much alcohol can affect your balance and may make you more likely to have a fall. Follow these instructions at home: Lifestyle Do not drink alcohol if: Your health care provider tells you not to drink. If you drink alcohol: Limit how much you have to: 0-1 drink a day for women. 0-2 drinks a day for men. Know how much alcohol is in your drink. In the [place] one drink equals one 12 oz bottle of beer (355 mL), one 5 oz glass of wine (148 mL), or one 1 oz glass of hard liquor (44 mL). Do not use any products that contain nicotine or tobacco. These products include cigarettes, chewing tobacco, and vaping devices, such as e-cigarettes. If you need help quitting, ask your health care provider. Activity  Follow a regular exercise program to stay fit. This will help you maintain your balance. Ask your health care provider what types of exercise are appropriate for you. If you need a cane or walker, use it as recommended by your health care provider. Wear supportive shoes that have nonskid soles. Safety  Remove any tripping hazards, such as rugs, cords, and clutter. Install safety equipment such as grab bars in bathrooms and safety rails on stairs. Keep rooms and walkways   well-lit. General instructions Talk with your health care provider about your risks for falling. Tell your health care provider if: You fall. Be sure to tell your health care provider about all falls, even ones that seem minor. You feel dizzy, tiredness (fatigue), or off-balance. Take over-the-counter and prescription medicines only as told by your health care provider. These include  supplements. Eat a healthy diet and maintain a healthy weight. A healthy diet includes low-fat dairy products, low-fat (lean) meats, and fiber from whole grains, beans, and lots of fruits and vegetables. Stay current with your vaccines. Schedule regular health, dental, and eye exams. Summary Having a healthy lifestyle and getting preventive care can help to protect your health and wellness after age 76. Screening and testing are the best way to find a health problem early and help you avoid having a fall. Early diagnosis and treatment give you the best chance for managing medical conditions that are more common for people who are older than age 76. Falls are a major cause of broken bones and head injuries in people who are older than age 76. Take precautions to prevent a fall at home. Work with your health care provider to learn what changes you can make to improve your health and wellness and to prevent falls. This information is not intended to replace advice given to you by your health care provider. Make sure you discuss any questions you have with your health care provider. Document Revised: 04/12/2021 Document Reviewed: 04/12/2021 Elsevier Patient Education  2023 Elsevier Inc.  

## 2022-06-09 NOTE — Assessment & Plan Note (Signed)
Chronic nature.  No specific treatment needed at this time. Most recent hematologist office visit notes reviewed.

## 2022-06-09 NOTE — Progress Notes (Signed)
Omar [name]. 76 y.o.   Chief Complaint  Patient presents with   Follow-up    Concerns about weight loss     HISTORY OF PRESENT ILLNESS: This is a 76 y.o. male here for follow-up.  No concerns. Doing well.  No medical complaints today. Weight loss has been stable.  On Marinol twice a day. Most recent oncologist assessment and plan as follows: ASSESSMENT:  1.  Leukopenia and neutropenia: - He was evaluated for leukopenia and CBC on 06/17/2021 showed white count 2.6 with ANC of 1.2. 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 - Work-up on 07/16/2021 showed white count 2.4 with ANC of 1.1.  ANA was negative.  M42 and folic acid were normal.  SPEP, immunofixation were negative on 09/08/2020.  2.  Social/family history: - He worked at [employer].  He also worked as a Theme park manager and currently retired. - He does all his ADLs and IADLs and drives. - Quit smoking more than 20 years ago. - Mother had colon cancer.  Brother had cancer.  Daughter had breast cancer.  Maternal uncle also had cancer, patient does not know type.  3.  Prostate cancer: - He underwent prostatectomy on 11/08/2012. - Pathology showed Gleason score 3+4= 7, PT2CNX. - Last PSA on 07/16/2021 was undetectable.     PLAN:  1.  Leukopenia and neutropenia: - Work-up for nutritional deficiencies, connective tissue disorders and plasma cell disorder was negative. - He had low white count with low ANC ranging between 1000-18 100 for few years with no recurrent infections.  Differential diagnosis includes benign ethnic neutropenia. - Recommend repeat blood counts in 4 months.  If there is any worsening, will consider bone marrow biopsy.  2.  Weight loss: - He has unexplained weight loss of 25 to 30 pounds in the past 6 months. - He is also eating less.  Wife thinks he has mild dementia. - We reviewed CT CAP done for weight loss from 09/07/2021 which did not show any evidence of adenopathy or suspicious  lesions.  No bone mets.  Benign hemangioma in the right hepatic lobe. - We will start him on Marinol 5 mg twice daily for appetite stimulation. - RTC 4 months.   Orders placed this encounter:  No orders of the defined types were placed in this encounter.       Derek Jack, MD [place] [phone] Wt Readings from Last 3 Encounters:  06/09/22 173 lb 2 oz (78.5 kg)  04/18/22 175 lb 3.2 oz (79.5 kg)  01/13/22 174 lb 8 oz (79.2 kg)     HPI   Prior to Admission medications   Medication Sig Start Date End Date Taking? Authorizing Provider  atorvastatin (LIPITOR) 40 MG tablet Take 1 tablet (40 mg total) by mouth daily. 04/04/22  Yes [place], Ines Bloomer, MD  OVER THE COUNTER MEDICATION daily.   Yes [provider]  psyllium (METAMUCIL) 58.6 % powder Take 1 packet by mouth daily.   Yes Horald Pollen, MD  donepezil (ARICEPT) 10 MG tablet Take 1 tablet (10 mg total) by mouth at bedtime. Patient not taking: Reported on 06/09/2022 01/13/22   Britt Bottom, MD  dronabinol (MARINOL) 5 MG capsule Take 1 capsule (5 mg total) by mouth 2 (two) times daily before a meal. Patient not taking: Reported on 06/09/2022 01/04/22   Derek Jack, MD  vardenafil (LEVITRA) 20 MG tablet Take 20 mg by mouth as needed.  02/29/12  [provider]    Allergies  Allergen Reactions   Sulfonamide Derivatives Other (See Comments)     blisters on skin    Patient Active Problem List   Diagnosis Date Noted   Essential hypertension 11/01/2021   History of colon cancer 06/17/2021   Lipoma of anterior chest wall 04/29/2021   Snoring 01/13/2021   Dementia without behavioral disturbance ([place]) 01/13/2021   Excessive daytime sleepiness 01/13/2021   Memory loss 09/08/2020   Numbness 09/08/2020   Gait disturbance 09/08/2020   Diverticulosis 04/04/2019   Internal hemorrhoids 04/04/2019   Loss of weight 01/01/2019   Dysplastic mass of the cecum s/p lap right  colectomy 08/31/2017 09/02/2017   S/P right colectomy 08/31/2017   Polyneuropathy 11/21/2016   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   History of pulmonary embolism 07/18/2014   Adrenal adenoma 03/18/2014   Coronary atherosclerosis of native coronary artery 03/18/2014   Hyperlipidemia 11/13/2013   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/2013   Prostate cancer ([place]) 11/09/2012   Stricture and stenosis of esophagus 05/31/2012   Leukopenia 05/27/2009   Esophageal reflux 10/09/2007   Dyslipidemia 07/16/2007   Depression 07/16/2007   COLONIC POLYPS, HX OF 07/16/2007    Past Medical History:  Diagnosis Date   Anxiety    BPH (benign prostatic hypertrophy)    Decreased white blood cell count    Depression    Diverticulosis    Erectile dysfunction    Gastric polyps    GERD (gastroesophageal reflux disease)    History of colonic polyps    History of esophageal stricture 10/31/2007   History of kidney stones    History of prostate surgery    History of rheumatic fever    Hyperlipidemia    Hypertension    Prostate cancer ([place]) 11/09/2012   Prostatism     Past Surgical History:  Procedure Laterality Date   CARPAL TUNNEL RELEASE     Bilateral   ESOPHAGEAL DILATION     last dilation 8'12   INGUINAL HERNIA REPAIR     INSERTION OF MESH N/A 04/15/2013   Procedure: INSERTION OF MESH;  Surgeon: Odis Hollingshead, MD;  Location: [facility];  Service: General;  Laterality: N/A;   KNEE ARTHROSCOPY     left   LAPAROSCOPIC RIGHT HEMI COLECTOMY Right 08/31/2017   Procedure: LAPAROSCOPIC ASSISTED  RIGHT HEMI COLECTOMY;  Surgeon: Johnathan Hausen, MD;  Location: [facility];  Service: General;  Laterality: Right;   LEFT HEART CATHETERIZATION WITH CORONARY ANGIOGRAM N/A 07/18/2014   Procedure: LEFT HEART CATHETERIZATION WITH CORONARY ANGIOGRAM;  Surgeon: Burnell Blanks, MD;  Location[facility] C[facility];  Service: Cardiovascular;  Laterality: N/A;   ROBOT ASSISTED LAPAROSCOPIC RADICAL  PROSTATECTOMY  11/08/2012   Procedure: ROBOTIC ASSISTED LAPAROSCOPIC RADICAL PROSTATECTOMY LEVEL 1;  Surgeon: Molli Hazard, MD;  Location: [facility];  Service: Urology;  Laterality: N/A;      TONSILLECTOMY     UMBILICAL HERNIA REPAIR N/A 04/15/2013   Procedure: HERNIA REPAIR UMBILICAL ADULT;  Surgeon: Odis Hollingshead, MD;  Location: [facility];  Service: General;  Laterality: N/A;    Social History   Socioeconomic History   Marital status: Married    Spouse name: Not on file   Number of children: 4   Years of education: Not on file   Highest education level: Not on file  Occupational History   Occupation: Theme park manager    Employer: [employer]  Tobacco Use   Smoking status: Former  Packs/day: 1.00    Years: 25.00    Total pack years: 25.00    Types: Cigarettes    Quit date: 12/05/1993    Years since quitting: 28.5   Smokeless tobacco: Never  Vaping Use   Vaping Use: Never used  Substance and Sexual Activity   Alcohol use: No   Drug use: No   Sexual activity: Yes  Other Topics Concern   Not on file  Social History Narrative   Lives with wife in a one story home.  Has 4 children.  Works as a Theme park manager.     Education: 10th grade.    Caffeine use: soda (one per day)   Right handed   Social Determinants of Health   Financial Resource Strain: Low Risk  (11/08/2021)   Overall Financial Resource Strain (CARDIA)    Difficulty of Paying Living Expenses: Not hard at all  Food Insecurity: No Food Insecurity (06/08/2022)   Hunger Vital Sign    Worried About Running Out of Food in the Last Year: Never true    Ran Out of Food in the Last Year: Never true  Transportation Needs: No Transportation Needs (06/08/2022)   PRAPARE - Hydrologist (Medical): No    Lack of Transportation (Non-Medical): No  Physical Activity: Sufficiently Active (09/22/2021)   Exercise Vital Sign    Days of Exercise per Week: 5 days    Minutes of Exercise per Session: 30  min  Stress: No Stress Concern Present (09/22/2021)   [place]    Feeling of Stress : Not at all  Social Connections: [place] (09/22/2021)   Social Connection and Isolation Panel [NHANES]    Frequency of Communication with Friends and Family: More than three times a week    Frequency of Social Gatherings with Friends and Family: More than three times a week    Attends Religious Services: More than 4 times per year    Active Member of [employer] or Organizations: Yes    Attends Music therapist: More than 4 times per year    Marital Status: Married  Human resources officer Violence: Not At Risk (09/22/2021)   Humiliation, Afraid, Rape, and Kick questionnaire    Fear of Current or Ex-Partner: No    Emotionally Abused: No    Physically Abused: No    Sexually Abused: No    Family History  Problem Relation Age of Onset   Ovarian cancer Mother    Colon cancer Mother 4   High Cholesterol Mother    Stroke Father    Lung cancer Maternal Uncle    Lung cancer Maternal Aunt    Esophageal cancer Neg Hx    Stomach cancer Neg Hx    Rectal cancer Neg Hx    Pancreatic cancer Neg Hx    Liver disease Neg Hx      Review of Systems  Constitutional: Negative.  Negative for chills and fever.  HENT: Negative.  Negative for congestion and sore throat.   Respiratory: Negative.  Negative for cough and shortness of breath.   Cardiovascular: Negative.  Negative for chest pain and palpitations.  Gastrointestinal: Negative.  Negative for abdominal pain, nausea and vomiting.  Genitourinary: Negative.   Skin: Negative.  Negative for rash.  Neurological: Negative.  Negative for dizziness and headaches.  All other systems reviewed and are negative.  Today's Vitals   06/09/22 1104  BP: 132/74  Pulse: 70  Temp: 98.2 F (  36.8 C)  TempSrc: Oral  SpO2: 99%  Weight: 173 lb 2 oz (78.5 kg)  Height: 6' 3" (1.905 m)   Body  mass index is 21.64 kg/m.   Physical Exam Vitals reviewed.  Constitutional:      Appearance: Normal appearance.  HENT:     Head: Normocephalic.  Eyes:     Extraocular Movements: Extraocular movements intact.     Conjunctiva/sclera: Conjunctivae normal.     Pupils: Pupils are equal, round, and reactive to light.  Cardiovascular:     Rate and Rhythm: Normal rate and regular rhythm.     Pulses: Normal pulses.     Heart sounds: Normal heart sounds.  Pulmonary:     Effort: Pulmonary effort is normal.     Breath sounds: Normal breath sounds.  Abdominal:     Palpations: Abdomen is soft.     Tenderness: There is no abdominal tenderness.  Musculoskeletal:     Cervical back: No tenderness.     Right lower leg: No edema.     Left lower leg: No edema.  Lymphadenopathy:     Cervical: No cervical adenopathy.  Skin:    General: Skin is warm and dry.     Capillary Refill: Capillary refill takes less than 2 seconds.  Neurological:     General: No focal deficit present.     Mental Status: He is alert and oriented to person, place, and time.  Psychiatric:        Mood and Affect: Mood normal.        Behavior: Behavior normal.      ASSESSMENT & PLAN: A total of 45 minutes was spent with the patient and counseling/coordination of care regarding preparing for this visit, review of most recent office visit notes, review of most recent oncologist office visit note, review of most recent chronic care management visit note, review of most recent blood work results, review of multiple chronic medical problems and their management, review of all medications, education on nutrition, prognosis, documentation, need for follow-up.  Problem List Items Addressed This Visit       Cardiovascular and Mediastinum   Essential hypertension - Primary    Well-controlled hypertension.  Off medications.        Nervous and Auditory   Dementia without behavioral disturbance (HCC)    Stable.   Well-controlled. Continue Aricept 10 mg at bedtime. Most recent neurology office visit notes reviewed.        Other   Dyslipidemia    Stable.  Diet and nutrition discussed.  Recommend to continue atorvastatin 40 mg daily.      Leukopenia    Chronic nature.  No specific treatment needed at this time. Most recent hematologist office visit notes reviewed.      Loss of weight    Stable without further loss of weight. Advised to continue Marinol 5 mg twice a day before meals.      Patient Instructions  Health Maintenance After Age 76 After age 36, you are at a higher risk for certain [place]-term diseases and infections as well as injuries from falls. Falls are a major cause of broken bones and head injuries in people who are older than age 22. Getting regular preventive care can help to keep you healthy and well. Preventive care includes getting regular testing and making lifestyle changes as recommended by your health care provider. Talk with your health care provider about: Which screenings and tests you should have. A screening is a test that checks  for a disease when you have no symptoms. A diet and exercise plan that is right for you. What should I know about screenings and tests to prevent falls? Screening and testing are the best ways to find a health problem early. Early diagnosis and treatment give you the best chance of managing medical conditions that are common after age 35. Certain conditions and lifestyle choices may make you more likely to have a fall. Your health care provider may recommend: Regular vision checks. Poor vision and conditions such as cataracts can make you more likely to have a fall. If you wear glasses, make sure to get your prescription updated if your vision changes. Medicine review. Work with your health care provider to regularly review all of the medicines you are taking, including over-the-counter medicines. Ask your health care provider about any side  effects that may make you more likely to have a fall. Tell your health care provider if any medicines that you take make you feel dizzy or sleepy. Strength and balance checks. Your health care provider may recommend certain tests to check your strength and balance while standing, walking, or changing positions. Foot health exam. Foot pain and numbness, as well as not wearing proper footwear, can make you more likely to have a fall. Screenings, including: Osteoporosis screening. Osteoporosis is a condition that causes the bones to get weaker and break more easily. Blood pressure screening. Blood pressure changes and medicines to control blood pressure can make you feel dizzy. Depression screening. You may be more likely to have a fall if you have a fear of falling, feel depressed, or feel unable to do activities that you used to do. Alcohol use screening. Using too much alcohol can affect your balance and may make you more likely to have a fall. Follow these instructions at home: Lifestyle Do not drink alcohol if: Your health care provider tells you not to drink. If you drink alcohol: Limit how much you have to: 0-1 drink a day for women. 0-2 drinks a day for men. Know how much alcohol is in your drink. In the [place] one drink equals one 12 oz bottle of beer (355 mL), one 5 oz glass of wine (148 mL), or one 1 oz glass of hard liquor (44 mL). Do not use any products that contain nicotine or tobacco. These products include cigarettes, chewing tobacco, and vaping devices, such as e-cigarettes. If you need help quitting, ask your health care provider. Activity  Follow a regular exercise program to stay fit. This will help you maintain your balance. Ask your health care provider what types of exercise are appropriate for you. If you need a cane or walker, use it as recommended by your health care provider. Wear supportive shoes that have nonskid soles. Safety  Remove any tripping hazards, such as  rugs, cords, and clutter. Install safety equipment such as grab bars in bathrooms and safety rails on stairs. Keep rooms and walkways well-lit. General instructions Talk with your health care provider about your risks for falling. Tell your health care provider if: You fall. Be sure to tell your health care provider about all falls, even ones that seem minor. You feel dizzy, tiredness (fatigue), or off-balance. Take over-the-counter and prescription medicines only as told by your health care provider. These include supplements. Eat a healthy diet and maintain a healthy weight. A healthy diet includes low-fat dairy products, low-fat (lean) meats, and fiber from whole grains, beans, and lots of fruits and vegetables. Stay current  with your vaccines. Schedule regular health, dental, and eye exams. Summary Having a healthy lifestyle and getting preventive care can help to protect your health and wellness after age 49. Screening and testing are the best way to find a health problem early and help you avoid having a fall. Early diagnosis and treatment give you the best chance for managing medical conditions that are more common for people who are older than age 76. Falls are a major cause of broken bones and head injuries in people who are older than age 15. Take precautions to prevent a fall at home. Work with your health care provider to learn what changes you can make to improve your health and wellness and to prevent falls. This information is not intended to replace advice given to you by your health care provider. Make sure you discuss any questions you have with your health care provider. Document Revised: 04/12/2021 Document Reviewed: 04/12/2021 Elsevier Patient Education  [place], MD [facility]

## 2022-07-04 DIAGNOSIS — F0390 Unspecified dementia without behavioral disturbance: Secondary | ICD-10-CM | POA: Diagnosis not present

## 2022-07-04 DIAGNOSIS — E785 Hyperlipidemia, unspecified: Secondary | ICD-10-CM | POA: Diagnosis not present

## 2022-07-14 ENCOUNTER — Other Ambulatory Visit (HOSPITAL_COMMUNITY): Payer: Self-pay | Admitting: Hematology

## 2022-07-14 DIAGNOSIS — Z8546 Personal history of malignant neoplasm of prostate: Secondary | ICD-10-CM

## 2022-07-19 ENCOUNTER — Other Ambulatory Visit (HOSPITAL_COMMUNITY): Payer: Self-pay | Admitting: Hematology

## 2022-07-19 DIAGNOSIS — Z8546 Personal history of malignant neoplasm of prostate: Secondary | ICD-10-CM

## 2022-09-14 ENCOUNTER — Telehealth: Payer: Self-pay | Admitting: Emergency Medicine

## 2022-09-14 NOTE — Telephone Encounter (Signed)
N/A unable to leave a message for patient to call back to schedule Medicare Annual Wellness Visit   Last AWV  09/22/21  Please schedule at anytime with [facility] if patient calls the office back.      Any questions, please call me at [phone]

## 2022-09-23 ENCOUNTER — Other Ambulatory Visit: Payer: Self-pay

## 2022-09-23 DIAGNOSIS — D72819 Decreased white blood cell count, unspecified: Secondary | ICD-10-CM

## 2022-09-23 DIAGNOSIS — R718 Other abnormality of red blood cells: Secondary | ICD-10-CM

## 2022-09-26 ENCOUNTER — Inpatient Hospital Stay: Payer: Medicare Other | Attending: Hematology

## 2022-09-26 DIAGNOSIS — Z8546 Personal history of malignant neoplasm of prostate: Secondary | ICD-10-CM | POA: Insufficient documentation

## 2022-09-26 DIAGNOSIS — D72819 Decreased white blood cell count, unspecified: Secondary | ICD-10-CM | POA: Insufficient documentation

## 2022-09-26 DIAGNOSIS — Z9049 Acquired absence of other specified parts of digestive tract: Secondary | ICD-10-CM | POA: Insufficient documentation

## 2022-09-26 DIAGNOSIS — Z803 Family history of malignant neoplasm of breast: Secondary | ICD-10-CM | POA: Insufficient documentation

## 2022-09-26 DIAGNOSIS — Z800 Family history of malignant neoplasm of digestive organs: Secondary | ICD-10-CM | POA: Insufficient documentation

## 2022-09-26 DIAGNOSIS — I10 Essential (primary) hypertension: Secondary | ICD-10-CM | POA: Insufficient documentation

## 2022-09-26 DIAGNOSIS — F03A0 Unspecified dementia, mild, without behavioral disturbance, psychotic disturbance, mood disturbance, and anxiety: Secondary | ICD-10-CM | POA: Insufficient documentation

## 2022-09-26 DIAGNOSIS — R718 Other abnormality of red blood cells: Secondary | ICD-10-CM | POA: Insufficient documentation

## 2022-09-26 DIAGNOSIS — Z801 Family history of malignant neoplasm of trachea, bronchus and lung: Secondary | ICD-10-CM | POA: Insufficient documentation

## 2022-09-26 DIAGNOSIS — Z87891 Personal history of nicotine dependence: Secondary | ICD-10-CM | POA: Insufficient documentation

## 2022-09-26 DIAGNOSIS — R634 Abnormal weight loss: Secondary | ICD-10-CM | POA: Insufficient documentation

## 2022-09-26 DIAGNOSIS — Z8041 Family history of malignant neoplasm of ovary: Secondary | ICD-10-CM | POA: Insufficient documentation

## 2022-09-27 ENCOUNTER — Inpatient Hospital Stay: Payer: Medicare Other

## 2022-09-27 DIAGNOSIS — R718 Other abnormality of red blood cells: Secondary | ICD-10-CM

## 2022-09-27 DIAGNOSIS — Z800 Family history of malignant neoplasm of digestive organs: Secondary | ICD-10-CM | POA: Diagnosis not present

## 2022-09-27 DIAGNOSIS — Z803 Family history of malignant neoplasm of breast: Secondary | ICD-10-CM | POA: Diagnosis not present

## 2022-09-27 DIAGNOSIS — R634 Abnormal weight loss: Secondary | ICD-10-CM | POA: Diagnosis not present

## 2022-09-27 DIAGNOSIS — Z8546 Personal history of malignant neoplasm of prostate: Secondary | ICD-10-CM | POA: Diagnosis not present

## 2022-09-27 DIAGNOSIS — D72819 Decreased white blood cell count, unspecified: Secondary | ICD-10-CM

## 2022-09-27 DIAGNOSIS — Z87891 Personal history of nicotine dependence: Secondary | ICD-10-CM | POA: Diagnosis not present

## 2022-09-27 DIAGNOSIS — Z801 Family history of malignant neoplasm of trachea, bronchus and lung: Secondary | ICD-10-CM | POA: Diagnosis not present

## 2022-09-27 DIAGNOSIS — Z8041 Family history of malignant neoplasm of ovary: Secondary | ICD-10-CM | POA: Diagnosis not present

## 2022-09-27 DIAGNOSIS — Z9049 Acquired absence of other specified parts of digestive tract: Secondary | ICD-10-CM | POA: Diagnosis not present

## 2022-09-27 DIAGNOSIS — I10 Essential (primary) hypertension: Secondary | ICD-10-CM | POA: Diagnosis not present

## 2022-09-27 DIAGNOSIS — F03A0 Unspecified dementia, mild, without behavioral disturbance, psychotic disturbance, mood disturbance, and anxiety: Secondary | ICD-10-CM | POA: Diagnosis not present

## 2022-09-27 LAB — CBC WITH DIFFERENTIAL/PLATELET
Abs Immature Granulocytes: 0 10*3/uL (ref 0.00–0.07)
Basophils Absolute: 0.1 10*3/uL (ref 0.0–0.1)
Basophils Relative: 2 %
Eosinophils Absolute: 0.2 10*3/uL (ref 0.0–0.5)
Eosinophils Relative: 7 %
HCT: 42.2 % (ref 39.0–52.0)
Hemoglobin: 13.6 g/dL (ref 13.0–17.0)
Immature Granulocytes: 0 %
Lymphocytes Relative: 31 %
Lymphs Abs: 0.9 10*3/uL (ref 0.7–4.0)
MCH: 27.4 pg (ref 26.0–34.0)
MCHC: 32.2 g/dL (ref 30.0–36.0)
MCV: 85.1 fL (ref 80.0–100.0)
Monocytes Absolute: 0.2 10*3/uL (ref 0.1–1.0)
Monocytes Relative: 7 %
Neutro Abs: 1.5 10*3/uL — ABNORMAL LOW (ref 1.7–7.7)
Neutrophils Relative %: 53 %
Platelets: 150 10*3/uL (ref 150–400)
RBC: 4.96 MIL/uL (ref 4.22–5.81)
RDW: 13.2 % (ref 11.5–15.5)
WBC: 2.9 10*3/uL — ABNORMAL LOW (ref 4.0–10.5)
nRBC: 0 % (ref 0.0–0.2)

## 2022-09-27 LAB — COMPREHENSIVE METABOLIC PANEL
ALT: 21 U/L (ref 0–44)
AST: 18 U/L (ref 15–41)
Albumin: 3.8 g/dL (ref 3.5–5.0)
Alkaline Phosphatase: 60 U/L (ref 38–126)
Anion gap: 4 — ABNORMAL LOW (ref 5–15)
BUN: 14 mg/dL (ref 8–23)
CO2: 31 mmol/L (ref 22–32)
Calcium: 9 mg/dL (ref 8.9–10.3)
Chloride: 104 mmol/L (ref 98–111)
Creatinine, Ser: 1.28 mg/dL — ABNORMAL HIGH (ref 0.61–1.24)
GFR, Estimated: 58 mL/min — ABNORMAL LOW (ref >60)
Glucose, Bld: 112 mg/dL — ABNORMAL HIGH (ref 70–99)
Potassium: 3.7 mmol/L (ref 3.5–5.1)
Sodium: 139 mmol/L (ref 135–145)
Total Bilirubin: 0.9 mg/dL (ref 0.3–1.2)
Total Protein: 6.3 g/dL — ABNORMAL LOW (ref 6.5–8.1)

## 2022-09-27 LAB — IRON AND TIBC
Iron: 102 ug/dL (ref 45–182)
Saturation Ratios: 31 % (ref 17.9–39.5)
TIBC: 333 ug/dL (ref 250–450)
UIBC: 231 ug/dL

## 2022-09-27 LAB — FERRITIN: Ferritin: 48 ng/mL (ref 24–336)

## 2022-09-27 LAB — FOLATE: Folate: 21.5 ng/mL (ref >5.9)

## 2022-09-27 LAB — VITAMIN B12: Vitamin B-12: 627 pg/mL (ref 180–914)

## 2022-09-29 ENCOUNTER — Ambulatory Visit: Payer: Medicare Other | Admitting: Hematology

## 2022-09-29 ENCOUNTER — Inpatient Hospital Stay (HOSPITAL_BASED_OUTPATIENT_CLINIC_OR_DEPARTMENT_OTHER): Payer: Medicare Other | Admitting: Hematology

## 2022-09-29 DIAGNOSIS — Z8546 Personal history of malignant neoplasm of prostate: Secondary | ICD-10-CM | POA: Diagnosis not present

## 2022-09-29 DIAGNOSIS — I10 Essential (primary) hypertension: Secondary | ICD-10-CM | POA: Diagnosis not present

## 2022-09-29 DIAGNOSIS — Z9049 Acquired absence of other specified parts of digestive tract: Secondary | ICD-10-CM | POA: Diagnosis not present

## 2022-09-29 DIAGNOSIS — R634 Abnormal weight loss: Secondary | ICD-10-CM | POA: Diagnosis not present

## 2022-09-29 DIAGNOSIS — D72819 Decreased white blood cell count, unspecified: Secondary | ICD-10-CM | POA: Diagnosis not present

## 2022-09-29 DIAGNOSIS — R718 Other abnormality of red blood cells: Secondary | ICD-10-CM | POA: Diagnosis not present

## 2022-09-29 DIAGNOSIS — F03A0 Unspecified dementia, mild, without behavioral disturbance, psychotic disturbance, mood disturbance, and anxiety: Secondary | ICD-10-CM | POA: Diagnosis not present

## 2022-09-29 MED ORDER — DRONABINOL 10 MG PO CAPS: ORAL_CAPSULE | ORAL | 5 refills | Status: DC

## 2022-09-29 NOTE — Progress Notes (Signed)
[place], [place] [address]   CLINIC:  Medical Oncology/Hematology  PCP:  Horald Pollen, MD [address] / [place] [address]  [phone]  REASON FOR VISIT:  Follow-up for leukopenia  PRIOR THERAPY: none  CURRENT THERAPY: surveillance  INTERVAL HISTORY:  Omar [name]., a 76 y.o. male, returns for follow-up of leukopenia.  He has mild dementia.  He reports that he is taking Marinol 5 mg twice daily.  But he has lost about 7 pounds in the last 3 months.  He lives at home with his wife and granddaughter.  REVIEW OF SYSTEMS:  Review of Systems  Gastrointestinal:  Positive for constipation.  Neurological:  Positive for numbness (In the feet stable).  All other systems reviewed and are negative.   PAST MEDICAL/SURGICAL HISTORY:  Past Medical History:  Diagnosis Date   Anxiety    BPH (benign prostatic hypertrophy)    Decreased white blood cell count    Depression    Diverticulosis    Erectile dysfunction    Gastric polyps    GERD (gastroesophageal reflux disease)    History of colonic polyps    History of esophageal stricture 10/31/2007   History of kidney stones    History of prostate surgery    History of rheumatic fever    Hyperlipidemia    Hypertension    Prostate cancer ([place]) 11/09/2012   Prostatism    Past Surgical History:  Procedure Laterality Date   CARPAL TUNNEL RELEASE     Bilateral   ESOPHAGEAL DILATION     last dilation 8'12   INGUINAL HERNIA REPAIR     INSERTION OF MESH N/A 04/15/2013   Procedure: INSERTION OF MESH;  Surgeon: Odis Hollingshead, MD;  Location: [facility];  Service: General;  Laterality: N/A;   KNEE ARTHROSCOPY     left   LAPAROSCOPIC RIGHT HEMI COLECTOMY Right 08/31/2017   Procedure: LAPAROSCOPIC ASSISTED  RIGHT HEMI COLECTOMY;  Surgeon: Johnathan Hausen, MD;  Location: [facility];  Service: General;  Laterality: Right;   LEFT HEART CATHETERIZATION WITH CORONARY ANGIOGRAM N/A 07/18/2014    Procedure: LEFT HEART CATHETERIZATION WITH CORONARY ANGIOGRAM;  Surgeon: Burnell Blanks, MD;  Location: [facility] CATH LAB;  Service: Cardiovascular;  Laterality: N/A;   ROBOT ASSISTED LAPAROSCOPIC RADICAL PROSTATECTOMY  11/08/2012   Procedure: ROBOTIC ASSISTED LAPAROSCOPIC RADICAL PROSTATECTOMY LEVEL 1;  Surgeon: Molli Hazard, MD;  Location: [facility];  Service: Urology;  Laterality: N/A;      TONSILLECTOMY     UMBILICAL HERNIA REPAIR N/A 04/15/2013   Procedure: HERNIA REPAIR UMBILICAL ADULT;  Surgeon: Odis Hollingshead, MD;  Location: [facility];  Service: General;  Laterality: N/A;    SOCIAL HISTORY:  Social History   Socioeconomic History   Marital status: Married    Spouse name: Not on file   Number of children: 4   Years of education: Not on file   Highest education level: Not on file  Occupational History   Occupation: Theme park manager    Employer: [employer]  Tobacco Use   Smoking status: Former    Packs/day: 1.00    Years: 25.00    Total pack years: 25.00    Types: Cigarettes    Quit date: 12/05/1993    Years since quitting: 28.8   Smokeless tobacco: Never  Vaping Use   Vaping Use: Never used  Substance and Sexual Activity   Alcohol use: No   Drug use: No  Sexual activity: Yes  Other Topics Concern   Not on file  Social History Narrative   Lives with wife in a one story home.  Has 4 children.  Works as a Theme park manager.     Education: 10th grade.    Caffeine use: soda (one per day)   Right handed   Social Determinants of Health   Financial Resource Strain: Low Risk  (11/08/2021)   Overall Financial Resource Strain (CARDIA)    Difficulty of Paying Living Expenses: Not hard at all  Food Insecurity: No Food Insecurity (06/08/2022)   Hunger Vital Sign    Worried About Running Out of Food in the Last Year: Never true    Ran Out of Food in the Last Year: Never true  Transportation Needs: No Transportation Needs (06/08/2022)   PRAPARE - Radiographer, therapeutic (Medical): No    Lack of Transportation (Non-Medical): No  Physical Activity: Sufficiently Active (09/22/2021)   Exercise Vital Sign    Days of Exercise per Week: 5 days    Minutes of Exercise per Session: 30 min  Stress: No Stress Concern Present (09/22/2021)   [place]    Feeling of Stress : Not at all  Social Connections: [place] (09/22/2021)   Social Connection and Isolation Panel [NHANES]    Frequency of Communication with Friends and Family: More than three times a week    Frequency of Social Gatherings with Friends and Family: More than three times a week    Attends Religious Services: More than 4 times per year    Active Member of [employer] or Organizations: Yes    Attends Music therapist: More than 4 times per year    Marital Status: Married  Human resources officer Violence: Not At Risk (09/22/2021)   Humiliation, Afraid, [name], and Kick questionnaire    Fear of Current or Ex-Partner: No    Emotionally Abused: No    Physically Abused: No    Sexually Abused: No    FAMILY HISTORY:  Family History  Problem Relation Age of Onset   Ovarian cancer Mother    Colon cancer Mother 85   High Cholesterol Mother    Stroke Father    Lung cancer Maternal Uncle    Lung cancer Maternal Aunt    Esophageal cancer Neg Hx    Stomach cancer Neg Hx    Rectal cancer Neg Hx    Pancreatic cancer Neg Hx    Liver disease Neg Hx     CURRENT MEDICATIONS:  Current Outpatient Medications  Medication Sig Dispense Refill   atorvastatin (LIPITOR) 40 MG tablet Take 1 tablet (40 mg total) by mouth daily. (Patient not taking: Reported on 06/09/2022) 90 tablet 3   donepezil (ARICEPT) 10 MG tablet Take 1 tablet (10 mg total) by mouth at bedtime. 90 tablet 4   dronabinol (MARINOL) 5 MG capsule TAKE 1 CAPSULE BY MOUTH TWICE DAILY BEFORE A MEAL 60 capsule 3   OVER THE COUNTER MEDICATION daily.     psyllium  (METAMUCIL) 58.6 % powder Take 1 packet by mouth daily.     No current facility-administered medications for this visit.    ALLERGIES:  Allergies  Allergen Reactions   Sulfonamide Derivatives Other (See Comments)     blisters on skin    PHYSICAL EXAM:  Performance status (ECOG): 0 - Asymptomatic  There were no vitals filed for this visit. Wt Readings from Last 3 Encounters:  06/09/22  173 lb 2 oz (78.5 kg)  04/18/22 175 lb 3.2 oz (79.5 kg)  01/13/22 174 lb 8 oz (79.2 kg)   Physical Exam  LABORATORY DATA:  I have reviewed the labs as listed.     Latest Ref Rng & Units 09/27/2022   11:42 AM 07/16/2021   11:58 AM 06/17/2021    2:22 PM  CBC  WBC 4.0 - 10.5 K/uL 2.9  2.4  2.6   Hemoglobin 13.0 - 17.0 g/dL 13.6  14.1  14.1   Hematocrit 39.0 - 52.0 % 42.2  43.6    42.4  41.5   Platelets 150 - 400 K/uL 150  169  180.0       Latest Ref Rng & Units 09/27/2022   11:42 AM 11/01/2021   11:14 AM 09/07/2021   10:20 AM  CMP  Glucose 70 - 99 mg/dL 112  88    BUN 8 - 23 mg/dL 14  14    Creatinine 0.61 - 1.24 mg/dL 1.28  1.13  1.20   Sodium 135 - 145 mmol/L 139  140    Potassium 3.5 - 5.1 mmol/L 3.7  4.3    Chloride 98 - 111 mmol/L 104  102    CO2 22 - 32 mmol/L 31  32    Calcium 8.9 - 10.3 mg/dL 9.0  9.4    Total Protein 6.5 - 8.1 g/dL 6.3  6.3    Total Bilirubin 0.3 - 1.2 mg/dL 0.9  1.0    Alkaline Phos 38 - 126 U/L 60  62    AST 15 - 41 U/L 18  16    ALT 0 - 44 U/L 21  20        Component Value Date/Time   RBC 4.96 09/27/2022 1142   MCV 85.1 09/27/2022 1142   MCV 83 05/26/2020 1506   MCH 27.4 09/27/2022 1142   MCHC 32.2 09/27/2022 1142   RDW 13.2 09/27/2022 1142   RDW 13.0 05/26/2020 1506   LYMPHSABS 0.9 09/27/2022 1142   LYMPHSABS 1.1 05/26/2020 1506   MONOABS 0.2 09/27/2022 1142   EOSABS 0.2 09/27/2022 1142   EOSABS 0.2 05/26/2020 1506   BASOSABS 0.1 09/27/2022 1142   BASOSABS 0.1 05/26/2020 1506    DIAGNOSTIC IMAGING:  I have independently reviewed the scans  and discussed with the patient. No results found.   ASSESSMENT:  1.  Leukopenia and neutropenia: - He was evaluated for leukopenia and CBC on 06/17/2021 showed white count 2.6 with ANC of 1.2. 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 - Work-up on 07/16/2021 showed white count 2.4 with ANC of 1.1.  ANA was negative.  B01 and folic acid were normal.  SPEP, immunofixation were negative on 09/08/2020.  2.  Social/family history: - He worked at [employer].  He also worked as a Theme park manager and currently retired. - He does all his ADLs and IADLs and drives. - Quit smoking more than 20 years ago. - Mother had colon cancer.  Brother had cancer.  Daughter had breast cancer.  Maternal uncle also had cancer, patient does not know type.  3.  Prostate cancer: - He underwent prostatectomy on 11/08/2012. - Pathology showed Gleason score 3+4= 7, [id number]. - Last PSA on 07/16/2021 was undetectable.   PLAN:  1.  Leukopenia and neutropenia: - Previous work-up for nutritional deficiencies, connective tissue disorders and plasma cell disorders was negative. - He denies any recurrent infections. - Labs from 09/27/2022 shows white  count 2.9 with ANC of 1.5.  Rest of the CBC was normal.  Folic acid, E93 were normal. - No further work-up needed.  RTC 1 year for follow-up.  2.  Weight loss: - CT CAP on 09/07/2021 did not show any evidence of suspicious lesions. - He has dementia which is likely contributing to the weight loss. - He is already taking Marinol 5 mg twice daily but still lost about 7 pounds in the last 3 months. - Recommend increasing Marinol to 10 mg twice daily.  Orders placed this encounter:  No orders of the defined types were placed in this encounter.    Derek Jack, MD [place] [phone]

## 2022-10-03 ENCOUNTER — Other Ambulatory Visit: Payer: Self-pay | Admitting: *Deleted

## 2022-10-03 DIAGNOSIS — Z8546 Personal history of malignant neoplasm of prostate: Secondary | ICD-10-CM

## 2022-10-03 MED ORDER — DRONABINOL 10 MG PO CAPS
ORAL_CAPSULE | ORAL | 5 refills | Status: AC
Start: 2022-10-03 — End: ?

## 2022-11-12 DIAGNOSIS — Z23 Encounter for immunization: Secondary | ICD-10-CM | POA: Diagnosis not present

## 2022-11-24 ENCOUNTER — Telehealth: Payer: Self-pay | Admitting: Emergency Medicine

## 2022-11-24 NOTE — Telephone Encounter (Signed)
Left message for patient to call back to schedule Medicare Annual Wellness Visit   Last AWV  09/22/21  Please schedule at anytime with [facility] if patient calls the office back.     Any questions, please call me at [phone]

## 2022-12-20 DIAGNOSIS — C61 Malignant neoplasm of prostate: Secondary | ICD-10-CM | POA: Diagnosis not present

## 2022-12-27 DIAGNOSIS — C61 Malignant neoplasm of prostate: Secondary | ICD-10-CM | POA: Diagnosis not present

## 2022-12-27 DIAGNOSIS — N393 Stress incontinence (female) (male): Secondary | ICD-10-CM | POA: Diagnosis not present

## 2022-12-27 DIAGNOSIS — D3502 Benign neoplasm of left adrenal gland: Secondary | ICD-10-CM | POA: Diagnosis not present

## 2022-12-27 DIAGNOSIS — N5201 Erectile dysfunction due to arterial insufficiency: Secondary | ICD-10-CM | POA: Diagnosis not present

## 2023-02-01 ENCOUNTER — Telehealth: Payer: Self-pay | Admitting: Neurology

## 2023-02-01 NOTE — Telephone Encounter (Signed)
Pt's wife, Adedamola Gasparyan requesting for in home aide services. Did not have where would like Home care referral sent, will call back.

## 2023-02-03 NOTE — Telephone Encounter (Signed)
Pt wife called asking for a referral for home aide services. She didn't know name of Home Care, will call back

## 2023-02-03 NOTE — Telephone Encounter (Signed)
Pt wife called back and stated the name of the company is [id number] [employer] [address] [employer] [place]. Marcille Blanco ) [id number].

## 2023-02-05 IMAGING — CT CT CHEST-ABD-PELV W/ CM
3 of 5 series · 15 of 36 positions shown, 17 images · IV contrast (Omnipaque or Isovue)
Comparison: None.

CLINICAL DATA: Prostate cancer.  Surveillance.

EXAM:
CT CHEST, ABDOMEN, AND PELVIS WITH CONTRAST
TECHNIQUE: Multidetector CT imaging of the chest, abdomen and pelvis was
performed following the standard protocol during bolus
administration of intravenous contrast.
CONTRAST:  100mL OMNIPAQUE IOHEXOL 300 MG/ML  SOLN

[Series 2: cap with · axial · 0.89mm/px · z∈[+918,+1458]mm · 9 of 136 slices shown, 11 images]
[im 14/136  mediastinal]
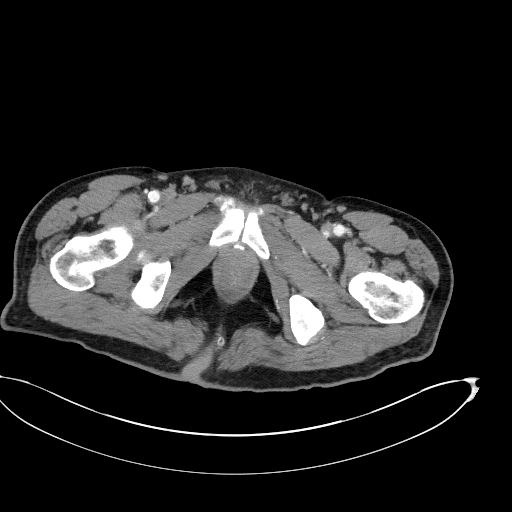
[im 14/136  bone]
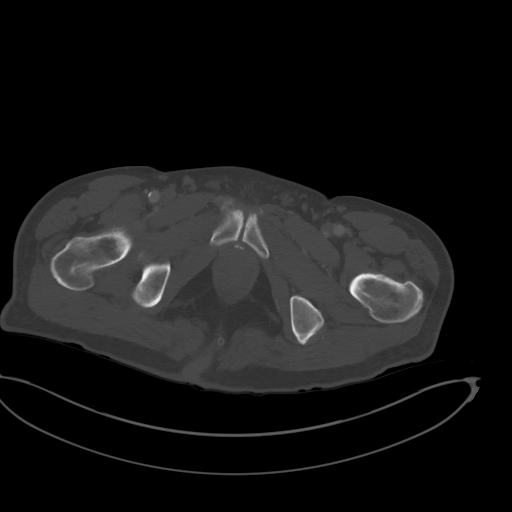
[im 28/136  mediastinal]
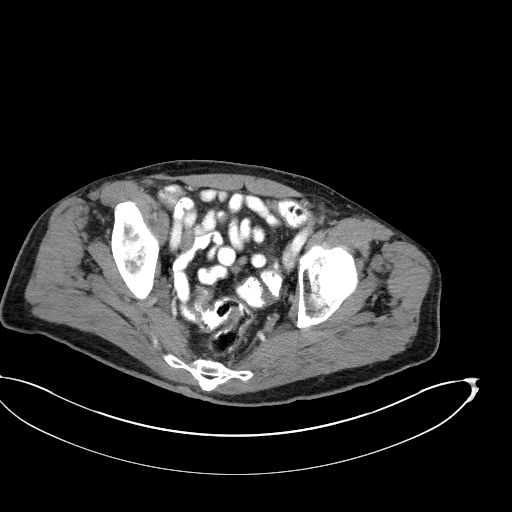
[im 41/136  mediastinal]
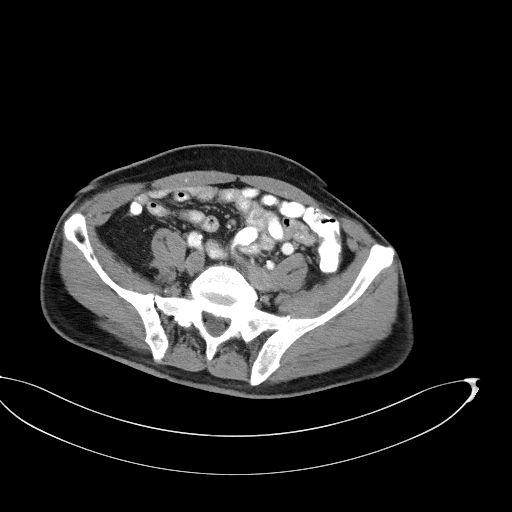
[im 55/136  mediastinal]
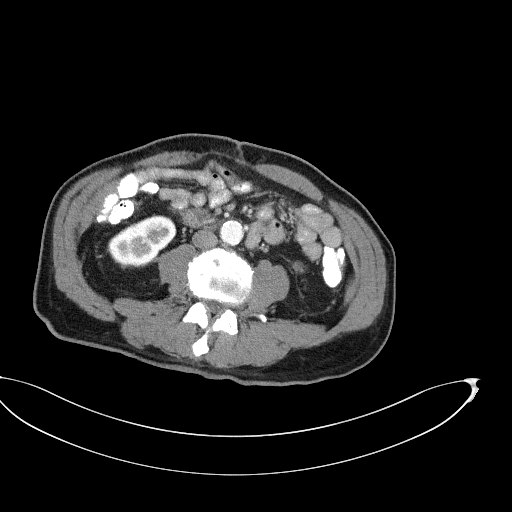
[im 68/136  mediastinal]
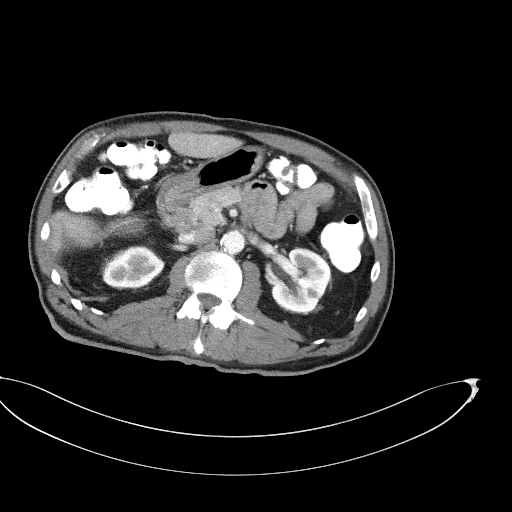
[im 82/136  mediastinal]
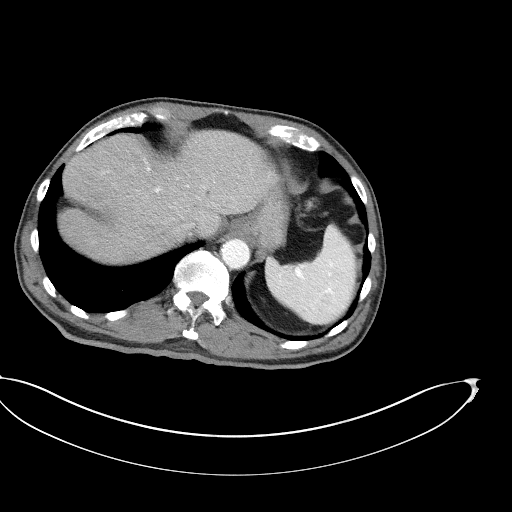
[im 95/136  mediastinal]
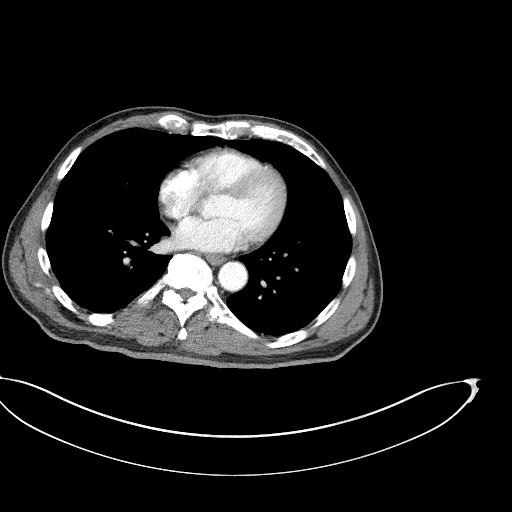
[im 109/136  mediastinal]
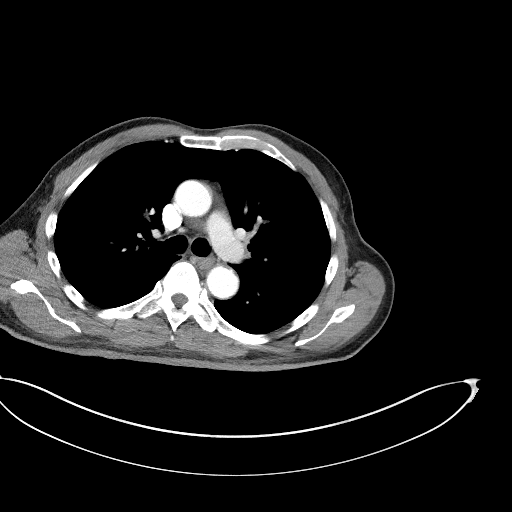
[im 122/136  mediastinal]
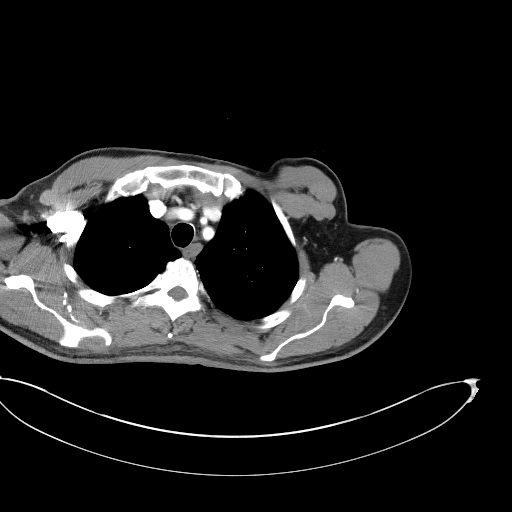
[im 122/136  bone]
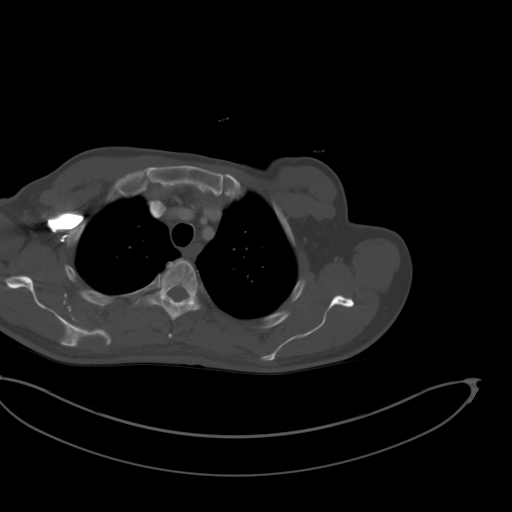

[Series 3: lung · axial · 0.89mm/px · z∈[+1208,+1288]mm · 3 of 174 slices shown]
[im 14/174  bone]
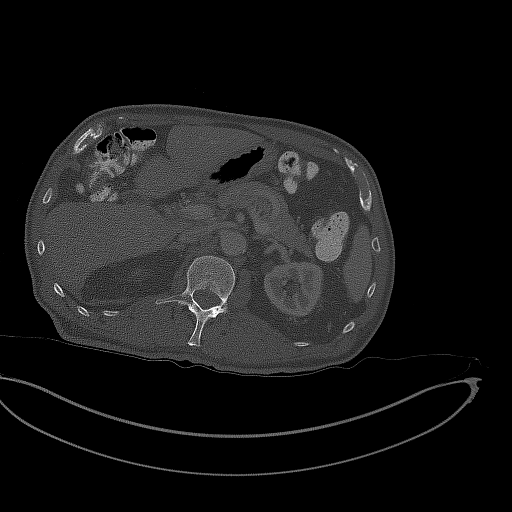
[im 40/174  bone]
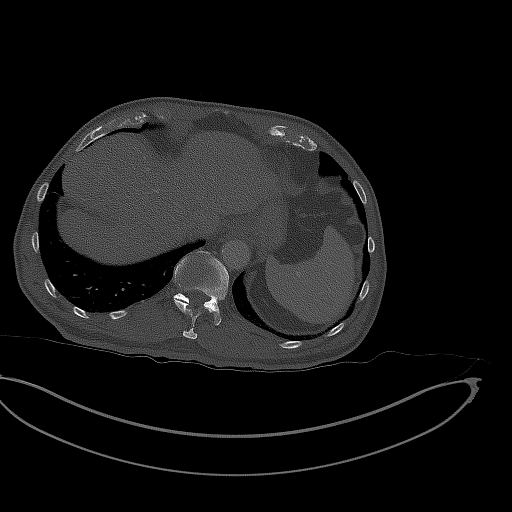
[im 54/174  bone]
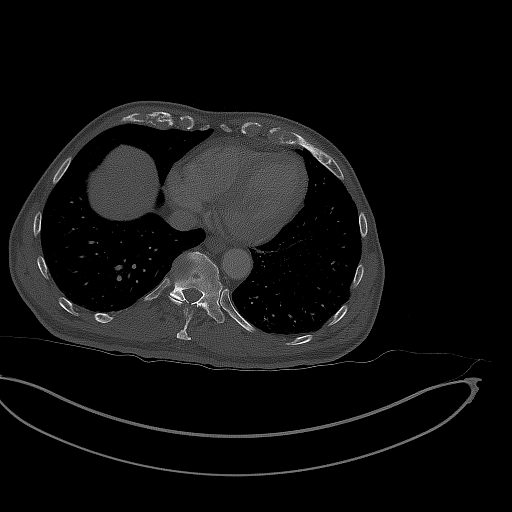

[Series 4: coronals · coronal · 0.92mm/px · 3 of 171 slices shown]
[im 35/171  mediastinal]
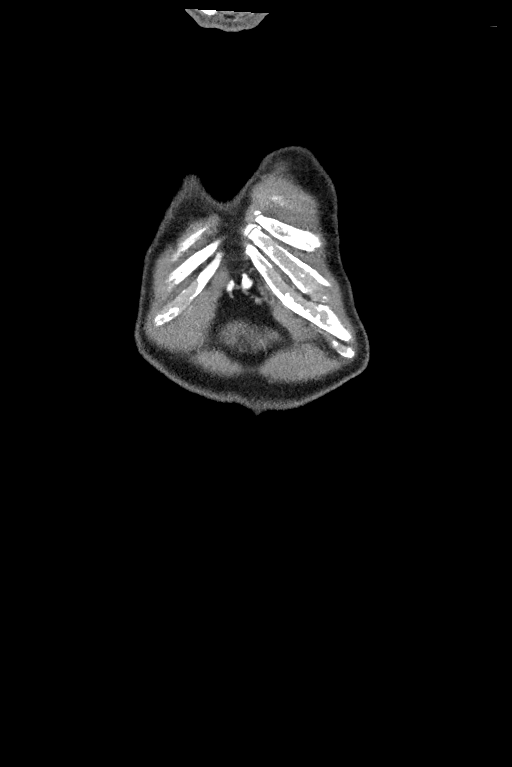
[im 69/171  mediastinal]
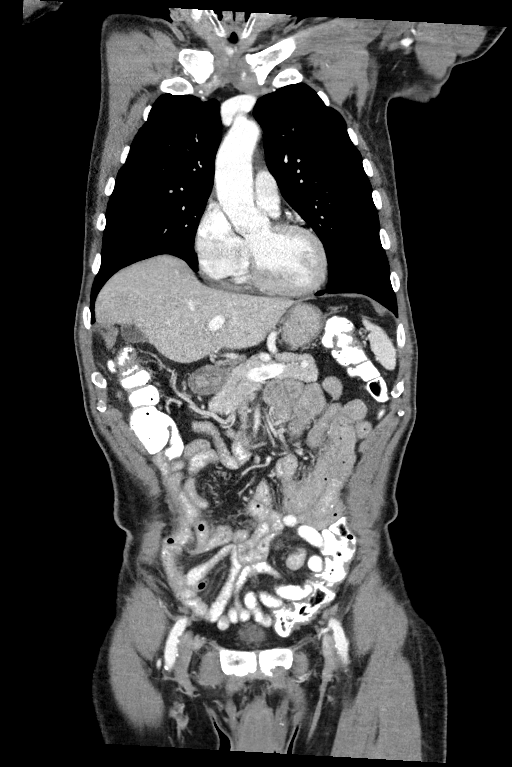
[im 103/171  mediastinal]
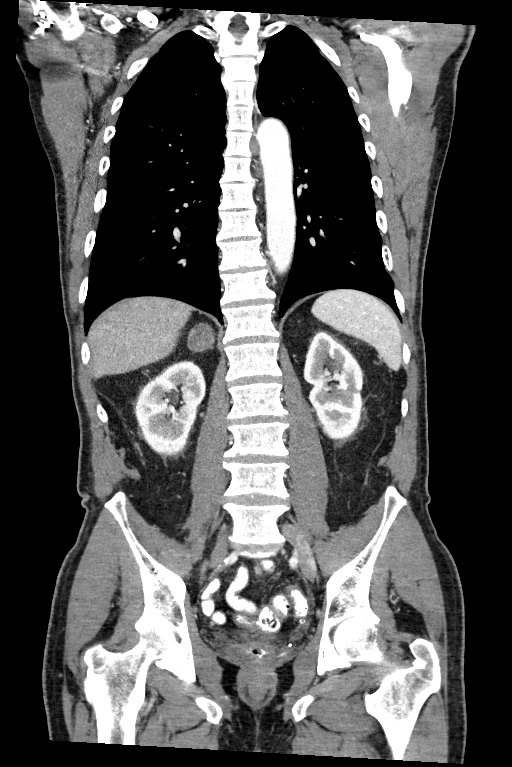

[15 of 36 positions shown; findings below may reference images not displayed]

FINDINGS: CT CHEST FINDINGS

Cardiovascular: No significant vascular findings. Normal heart size.
No pericardial effusion.

Mediastinum/Nodes: No axillary or supraclavicular adenopathy. No
mediastinal or hilar adenopathy. No pericardial fluid. Esophagus
normal.

Lungs/Pleura: No suspicious pulmonary nodules. Normal pleural.
Airways normal.

Musculoskeletal: No aggressive osseous lesion.

CT ABDOMEN AND PELVIS FINDINGS

Hepatobiliary: Peripherally enhancing lesion in the dome of the
RIGHT hepatic lobe is most suggestive of benign hemangioma. Lesion
similar to 09/16/2019.

Pancreas: Pancreas is normal. No ductal dilatation. No pancreatic
inflammation.

Spleen: Normal spleen

Adrenals/urinary tract: Bilateral enlargement of the adrenal glands
not changed from comparison exam 8989. Kidneys, ureters and bladder
normal.

Stomach/Bowel: Stomach, small bowel, appendix, and cecum are normal.
The colon and rectosigmoid colon are normal.

Vascular/Lymphatic: Abdominal aorta is normal caliber with
atherosclerotic calcification. There is no retroperitoneal or
periportal lymphadenopathy. No pelvic lymphadenopathy.

Reproductive: Post hysterectomy.  Adnexa unremarkable

Other: No peritoneal disease

Musculoskeletal: No aggressive osseous lesion.
IMPRESSION: Chest Impression:

No thoracic metastasis

Abdomen / Pelvis Impression:

1. No evidence of pelvic lymphadenopathy or abdominal
lymphadenopathy.
2. No pancreatic lesion identified.
3. No osseous metastasis
4. Benign hemangioma in the RIGHT hepatic lobe. Stable adrenal
lesions most consistent with lipid poor adenomas.

## 2023-02-06 NOTE — Telephone Encounter (Signed)
Please call wife back. Pt has not been seen in over a year. We would need updated visit first. Or, they can request this from primary care physician if he has seen them recently.

## 2023-02-08 ENCOUNTER — Telehealth: Payer: Self-pay | Admitting: Emergency Medicine

## 2023-02-08 NOTE — Telephone Encounter (Signed)
Noted  

## 2023-02-08 NOTE — Telephone Encounter (Addendum)
Called patient wife back in reference to previous message.  Unable to leave message due to VM being full. If patient's wife calls back please schedule an appointment with the provider to discuss [facility] options.

## 2023-02-08 NOTE — Telephone Encounter (Signed)
Pt's wife is requesting a call from Dr. Barry Brunner nurse to help coordinate getting help taking care of the pt at home,. Was unsure if there would need to be an OV or a referral.  Please call at: [phone]

## 2023-02-08 NOTE — Telephone Encounter (Signed)
Called pt wife and informed her of message nurse Emma sent for pt wife. She said she would ask PCP because he was recently at his office.

## 2023-02-21 ENCOUNTER — Other Ambulatory Visit: Payer: Self-pay | Admitting: Neurology

## 2023-02-28 ENCOUNTER — Ambulatory Visit: Payer: Medicare Other | Admitting: Emergency Medicine

## 2023-03-06 ENCOUNTER — Ambulatory Visit (INDEPENDENT_AMBULATORY_CARE_PROVIDER_SITE_OTHER): Payer: Medicare Other | Admitting: Emergency Medicine

## 2023-03-06 ENCOUNTER — Encounter: Payer: Self-pay | Admitting: Emergency Medicine

## 2023-03-06 VITALS — BP 152/100 | HR 60 | Temp 98.4°F | Ht 75.0 in | Wt 165.4 lb

## 2023-03-06 DIAGNOSIS — E785 Hyperlipidemia, unspecified: Secondary | ICD-10-CM

## 2023-03-06 DIAGNOSIS — F32A Depression, unspecified: Secondary | ICD-10-CM

## 2023-03-06 DIAGNOSIS — R634 Abnormal weight loss: Secondary | ICD-10-CM | POA: Diagnosis not present

## 2023-03-06 DIAGNOSIS — F0390 Unspecified dementia without behavioral disturbance: Secondary | ICD-10-CM

## 2023-03-06 DIAGNOSIS — I10 Essential (primary) hypertension: Secondary | ICD-10-CM | POA: Diagnosis not present

## 2023-03-06 LAB — COMPREHENSIVE METABOLIC PANEL
ALT: 18 U/L (ref 0–53)
AST: 17 U/L (ref 0–37)
Albumin: 4.1 g/dL (ref 3.5–5.2)
Alkaline Phosphatase: 52 U/L (ref 39–117)
BUN: 19 mg/dL (ref 6–23)
CO2: 32 mEq/L (ref 19–32)
Calcium: 9.5 mg/dL (ref 8.4–10.5)
Chloride: 105 mEq/L (ref 96–112)
Creatinine, Ser: 1.27 mg/dL (ref 0.40–1.50)
GFR: 54.65 mL/min — ABNORMAL LOW (ref >60.00)
Glucose, Bld: 80 mg/dL (ref 70–99)
Potassium: 4.2 mEq/L (ref 3.5–5.1)
Sodium: 138 mEq/L (ref 135–145)
Total Bilirubin: 0.7 mg/dL (ref 0.2–1.2)
Total Protein: 6.2 g/dL (ref 6.0–8.3)

## 2023-03-06 LAB — LIPID PANEL
Cholesterol: 175 mg/dL (ref 0–200)
HDL: 72.7 mg/dL (ref >39.00)
LDL Cholesterol: 84 mg/dL (ref 0–99)
NonHDL: 102.56
Total CHOL/HDL Ratio: 2
Triglycerides: 93 mg/dL (ref 0.0–149.0)
VLDL: 18.6 mg/dL (ref 0.0–40.0)

## 2023-03-06 NOTE — Assessment & Plan Note (Signed)
Stable. In remission.

## 2023-03-06 NOTE — Assessment & Plan Note (Signed)
Elevated blood pressure reading in the office Off medications Advised to monitor blood pressure reading at home daily for the next couple weeks and contact the office if numbers persistently abnormal May need to be started on antihypertensive medication Dietary approaches to stop hypertension discussed Cardiovascular risk associated with uncontrolled hypertension discussed

## 2023-03-06 NOTE — Assessment & Plan Note (Signed)
Stable.  Continues Aricept 10 mg daily. 

## 2023-03-06 NOTE — Assessment & Plan Note (Signed)
Stable.  Good appetite. Continue Marinol 10 mg twice a day

## 2023-03-06 NOTE — Patient Instructions (Signed)
Hypertension, Adult High blood pressure (hypertension) is when the force of blood pumping through the arteries is too strong. The arteries are the blood vessels that carry blood from the heart throughout the body. Hypertension forces the heart to work harder to pump blood and may cause arteries to become narrow or stiff. Untreated or uncontrolled hypertension can lead to a heart attack, heart failure, a stroke, kidney disease, and other problems. A blood pressure reading consists of a higher number over a lower number. Ideally, your blood pressure should be below 120/80. The first ("top") number is called the systolic pressure. It is a measure of the pressure in your arteries as your heart beats. The second ("bottom") number is called the diastolic pressure. It is a measure of the pressure in your arteries as the heart relaxes. What are the causes? The exact cause of this condition is not known. There are some conditions that result in high blood pressure. What increases the risk? Certain factors may make you more likely to develop high blood pressure. Some of these risk factors are under your control, including: Smoking. Not getting enough exercise or physical activity. Being overweight. Having too much fat, sugar, calories, or salt (sodium) in your diet. Drinking too much alcohol. Other risk factors include: Having a personal history of heart disease, diabetes, high cholesterol, or kidney disease. Stress. Having a family history of high blood pressure and high cholesterol. Having obstructive sleep apnea. Age. The risk increases with age. What are the signs or symptoms? High blood pressure may not cause symptoms. Very high blood pressure (hypertensive crisis) may cause: Headache. Fast or irregular heartbeats (palpitations). Shortness of breath. Nosebleed. Nausea and vomiting. Vision changes. Severe chest pain, dizziness, and seizures. How is this diagnosed? This condition is diagnosed by  measuring your blood pressure while you are seated, with your arm resting on a flat surface, your legs uncrossed, and your feet flat on the floor. The cuff of the blood pressure monitor will be placed directly against the skin of your upper arm at the level of your heart. Blood pressure should be measured at least twice using the same arm. Certain conditions can cause a difference in blood pressure between your right and left arms. If you have a high blood pressure reading during one visit or you have normal blood pressure with other risk factors, you may be asked to: Return on a different day to have your blood pressure checked again. Monitor your blood pressure at home for 1 week or longer. If you are diagnosed with hypertension, you may have other blood or imaging tests to help your health care provider understand your overall risk for other conditions. How is this treated? This condition is treated by making healthy lifestyle changes, such as eating healthy foods, exercising more, and reducing your alcohol intake. You may be referred for counseling on a healthy diet and physical activity. Your health care provider may prescribe medicine if lifestyle changes are not enough to get your blood pressure under control and if: Your systolic blood pressure is above 130. Your diastolic blood pressure is above 80. Your personal target blood pressure may vary depending on your medical conditions, your age, and other factors. Follow these instructions at home: Eating and drinking  Eat a diet that is high in fiber and potassium, and low in sodium, added sugar, and fat. An example of this eating plan is called the DASH diet. DASH stands for Dietary Approaches to Stop Hypertension. To eat this way: Eat   plenty of fresh fruits and vegetables. Try to fill one half of your plate at each meal with fruits and vegetables. Eat whole grains, such as whole-wheat pasta, brown rice, or whole-grain bread. Fill about one  fourth of your plate with whole grains. Eat or drink low-fat dairy products, such as skim milk or low-fat yogurt. Avoid fatty cuts of meat, processed or cured meats, and poultry with skin. Fill about one fourth of your plate with lean proteins, such as fish, chicken without skin, beans, eggs, or tofu. Avoid pre-made and processed foods. These tend to be higher in sodium, added sugar, and fat. Reduce your daily sodium intake. Many people with hypertension should eat less than 1,500 mg of sodium a day. Do not drink alcohol if: Your health care provider tells you not to drink. You are pregnant, may be pregnant, or are planning to become pregnant. If you drink alcohol: Limit how much you have to: 0-1 drink a day for women. 0-2 drinks a day for men. Know how much alcohol is in your drink. In the [place] one drink equals one 12 oz bottle of beer (355 mL), one 5 oz glass of wine (148 mL), or one 1 oz glass of hard liquor (44 mL). Lifestyle  Work with your health care provider to maintain a healthy body weight or to lose weight. Ask what an ideal weight is for you. Get at least 30 minutes of exercise that causes your heart to beat faster (aerobic exercise) most days of the week. Activities may include walking, swimming, or biking. Include exercise to strengthen your muscles (resistance exercise), such as Pilates or lifting weights, as part of your weekly exercise routine. Try to do these types of exercises for 30 minutes at least 3 days a week. Do not use any products that contain nicotine or tobacco. These products include cigarettes, chewing tobacco, and vaping devices, such as e-cigarettes. If you need help quitting, ask your health care provider. Monitor your blood pressure at home as told by your health care provider. Keep all follow-up visits. This is important. Medicines Take over-the-counter and prescription medicines only as told by your health care provider. Follow directions carefully. Blood  pressure medicines must be taken as prescribed. Do not skip doses of blood pressure medicine. Doing this puts you at risk for problems and can make the medicine less effective. Ask your health care provider about side effects or reactions to medicines that you should watch for. Contact a health care provider if you: Think you are having a reaction to a medicine you are taking. Have headaches that keep coming back (recurring). Feel dizzy. Have swelling in your ankles. Have trouble with your vision. Get help right away if you: Develop a severe headache or confusion. Have unusual weakness or numbness. Feel faint. Have severe pain in your chest or abdomen. Vomit repeatedly. Have trouble breathing. These symptoms may be an emergency. Get help right away. Call 911. Do not wait to see if the symptoms will go away. Do not drive yourself to the hospital. Summary Hypertension is when the force of blood pumping through your arteries is too strong. If this condition is not controlled, it may put you at risk for serious complications. Your personal target blood pressure may vary depending on your medical conditions, your age, and other factors. For most people, a normal blood pressure is less than 120/80. Hypertension is treated with lifestyle changes, medicines, or a combination of both. Lifestyle changes include losing weight, eating a healthy,   low-sodium diet, exercising more, and limiting alcohol. This information is not intended to replace advice given to you by your health care provider. Make sure you discuss any questions you have with your health care provider. Document Revised: 09/28/2021 Document Reviewed: 09/28/2021 Elsevier Patient Education  2023 [employer] Inc.  

## 2023-03-06 NOTE — Assessment & Plan Note (Signed)
Stable chronic condition.  Continue atorvastatin 40 mg daily. 

## 2023-03-06 NOTE — Progress Notes (Signed)
Omar [name]. 77 y.o.   Chief Complaint  Patient presents with   Medical Management of Chronic Issues    F/u appt, no concerns     HISTORY OF PRESENT ILLNESS: This is a 77 y.o. male A1A  here for follow-up of chronic medical conditions. Accompanied by wife. Overall doing well.  Has no complaints or medical concerns today. BP Readings from Last 3 Encounters:  09/29/22 (!) 155/94  06/09/22 132/74  04/18/22 118/78   Wt Readings from Last 3 Encounters:  03/06/23 165 lb 6 oz (75 kg)  09/29/22 166 lb (75.3 kg)  06/09/22 173 lb 2 oz (78.5 kg)     HPI   Prior to Admission medications   Medication Sig Start Date End Date Taking? Authorizing Provider  atorvastatin (LIPITOR) 40 MG tablet Take 1 tablet (40 mg total) by mouth daily. 04/04/22  Yes Ozetta Flatley, Ines Bloomer, MD  donepezil (ARICEPT) 10 MG tablet Take 1 tablet (10 mg total) by mouth at bedtime. 01/13/22  Yes Sater, Nanine Means, MD  dronabinol (MARINOL) 10 MG capsule TAKE 1 CAPSULE BY MOUTH TWICE DAILY BEFORE A MEAL 10/03/22  Yes Derek Jack, MD  psyllium (METAMUCIL) 58.6 % powder Take 1 packet by mouth daily.   Yes Ora Mcnatt, Ines Bloomer, MD  OVER THE COUNTER MEDICATION daily.    [provider]  vardenafil (LEVITRA) 20 MG tablet Take 20 mg by mouth as needed.    02/29/12  [provider]    Allergies  Allergen Reactions   Sulfonamide Derivatives Other (See Comments)     blisters on skin    Patient Active Problem List   Diagnosis Date Noted   Essential hypertension 11/01/2021   History of colon cancer 06/17/2021   Lipoma of anterior chest wall 04/29/2021   Snoring 01/13/2021   Dementia without behavioral disturbance 01/13/2021   Excessive daytime sleepiness 01/13/2021   Memory loss 09/08/2020   Numbness 09/08/2020   Gait disturbance 09/08/2020   Diverticulosis 04/04/2019   Internal hemorrhoids 04/04/2019   Loss of weight 01/01/2019   S/P right colectomy 08/31/2017   Polyneuropathy  11/21/2016   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   History of pulmonary embolism 07/18/2014   Adrenal adenoma 03/18/2014   Coronary atherosclerosis of native coronary artery 03/18/2014   Hyperlipidemia 11/13/2013   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/2013   Prostate cancer 11/09/2012   Stricture and stenosis of esophagus 05/31/2012   Leukopenia 05/27/2009   Esophageal reflux 10/09/2007   Dyslipidemia 07/16/2007   Depression 07/16/2007   COLONIC POLYPS, HX OF 07/16/2007    Past Medical History:  Diagnosis Date   Anxiety    BPH (benign prostatic hypertrophy)    Decreased white blood cell count    Depression    Diverticulosis    Erectile dysfunction    Gastric polyps    GERD (gastroesophageal reflux disease)    History of colonic polyps    History of esophageal stricture 10/31/2007   History of kidney stones    History of prostate surgery    History of rheumatic fever    Hyperlipidemia    Hypertension    Prostate cancer ([place]) 11/09/2012   Prostatism     Past Surgical History:  Procedure Laterality Date   CARPAL TUNNEL RELEASE     Bilateral   ESOPHAGEAL DILATION     last dilation 8'12   INGUINAL HERNIA REPAIR     INSERTION OF MESH N/A 04/15/2013   Procedure: INSERTION OF MESH;  Surgeon: Rhunette Croft  Rosenbower, MD;  Location: [facility];  Service: General;  Laterality: N/A;   KNEE ARTHROSCOPY     left   LAPAROSCOPIC RIGHT HEMI COLECTOMY Right 08/31/2017   Procedure: LAPAROSCOPIC ASSISTED  RIGHT HEMI COLECTOMY;  Surgeon: Johnathan Hausen, MD;  Location: [facility];  Service: General;  Laterality: Right;   LEFT HEART CATHETERIZATION WITH CORONARY ANGIOGRAM N/A 07/18/2014   Procedure: LEFT HEART CATHETERIZATION WITH CORONARY ANGIOGRAM;  Surgeon: Burnell Blanks, MD;  Location[facility];  Service: Cardiovascular;  Laterality: N/A;   ROBOT ASSISTED LAPAROSCOPIC RADICAL PROSTATECTOMY  11/08/2012   Procedure: ROBOTIC ASSISTED LAPAROSCOPIC RADICAL PROSTATECTOMY  LEVEL 1;  Surgeon: Molli Hazard, MD;  Location: [facility];  Service: Urology;  Laterality: N/A;      TONSILLECTOMY     UMBILICAL HERNIA REPAIR N/A 04/15/2013   Procedure: HERNIA REPAIR UMBILICAL ADULT;  Surgeon: Odis Hollingshead, MD;  Location: [facility];  Service: General;  Laterality: N/A;    Social History   Socioeconomic History   Marital status: Married    Spouse name: Not on file   Number of children: 4   Years of education: Not on file   Highest education level: Not on file  Occupational History   Occupation: Theme park manager    Employer: [employer]  Tobacco Use   Smoking status: Former    Packs/day: 1.00    Years: 25.00    Additional pack years: 0.00    Total pack years: 25.00    Types: Cigarettes    Quit date: 12/05/1993    Years since quitting: 29.2   Smokeless tobacco: Never  Vaping Use   Vaping Use: Never used  Substance and Sexual Activity   Alcohol use: No   Drug use: No   Sexual activity: Yes  Other Topics Concern   Not on file  Social History Narrative   Lives with wife in a one story home.  Has 4 children.  Works as a Theme park manager.     Education: 10th grade.    Caffeine use: soda (one per day)   Right handed   Social Determinants of Health   Financial Resource Strain: Low Risk  (11/08/2021)   Overall Financial Resource Strain (CARDIA)    Difficulty of Paying Living Expenses: Not hard at all  Food Insecurity: No Food Insecurity (06/08/2022)   Hunger Vital Sign    Worried About Running Out of Food in the Last Year: Never true    Ran Out of Food in the Last Year: Never true  Transportation Needs: No Transportation Needs (06/08/2022)   PRAPARE - Hydrologist (Medical): No    Lack of Transportation (Non-Medical): No  Physical Activity: Sufficiently Active (09/22/2021)   Exercise Vital Sign    Days of Exercise per Week: 5 days    Minutes of Exercise per Session: 30 min  Stress: No Stress Concern Present (09/22/2021)    [place]    Feeling of Stress : Not at all  Social Connections: [place] (09/22/2021)   Social Connection and Isolation Panel [NHANES]    Frequency of Communication with Friends and Family: More than three times a week    Frequency of Social Gatherings with Friends and Family: More than three times a week    Attends Religious Services: More than 4 times per year    Active Member of [employer] or Organizations: Yes    Attends Archivist Meetings: More than 4  times per year    Marital Status: Married  Human resources officer Violence: Not At Risk (09/22/2021)   Humiliation, Afraid, Rape, and Kick questionnaire    Fear of Current or Ex-Partner: No    Emotionally Abused: No    Physically Abused: No    Sexually Abused: No    Family History  Problem Relation Age of Onset   Ovarian cancer Mother    Colon cancer Mother 37   High Cholesterol Mother    Stroke Father    Lung cancer Maternal Uncle    Lung cancer Maternal Aunt    Esophageal cancer Neg Hx    Stomach cancer Neg Hx    Rectal cancer Neg Hx    Pancreatic cancer Neg Hx    Liver disease Neg Hx      Review of Systems  Constitutional: Negative.  Negative for chills and fever.  HENT: Negative.  Negative for congestion and sore throat.   Respiratory: Negative.  Negative for cough and shortness of breath.   Cardiovascular: Negative.  Negative for chest pain and palpitations.  Gastrointestinal:  Negative for abdominal pain, diarrhea, nausea and vomiting.  Genitourinary: Negative.  Negative for dysuria and hematuria.  Skin: Negative.  Negative for rash.  Neurological: Negative.  Negative for dizziness and headaches.  All other systems reviewed and are negative.   Today's Vitals   03/06/23 1444 03/06/23 1505  BP: (!) 146/98 (!) 152/100  Pulse: 60   Temp: 98.4 F (36.9 C)   TempSrc: Oral   SpO2: 99%   Weight: 165 lb 6 oz (75 kg)   Height: 6\' 3"   (1.905 m)    Body mass index is 20.67 kg/m.   Physical Exam Vitals reviewed.  Constitutional:      Appearance: Normal appearance.  HENT:     Head: Normocephalic.     Mouth/Throat:     Mouth: Mucous membranes are moist.     Pharynx: Oropharynx is clear.  Eyes:     Extraocular Movements: Extraocular movements intact.     Pupils: Pupils are equal, round, and reactive to light.  Cardiovascular:     Rate and Rhythm: Normal rate and regular rhythm.     Pulses: Normal pulses.     Heart sounds: Normal heart sounds.  Pulmonary:     Effort: Pulmonary effort is normal.     Breath sounds: Normal breath sounds.  Abdominal:     Palpations: Abdomen is soft.     Tenderness: There is no abdominal tenderness.  Musculoskeletal:     Cervical back: No tenderness.  Lymphadenopathy:     Cervical: No cervical adenopathy.  Skin:    General: Skin is warm and dry.     Capillary Refill: Capillary refill takes less than 2 seconds.  Neurological:     General: No focal deficit present.     Mental Status: He is alert and oriented to person, place, and time.  Psychiatric:        Mood and Affect: Mood normal.        Behavior: Behavior normal.      ASSESSMENT & PLAN: A total of 43 minutes was spent with the patient and counseling/coordination of care regarding preparing for this visit, review of most recent office visit notes, review of most recent blood work results, review of multiple chronic medical conditions under management, review of all medications, education on nutrition, cardiovascular risks associated with uncontrolled hypertension, prognosis, documentation, and need for follow-up.  Problem List Items Addressed This Visit  Cardiovascular and Mediastinum   Essential hypertension - Primary    Elevated blood pressure reading in the office Off medications Advised to monitor blood pressure reading at home daily for the next couple weeks and contact the office if numbers persistently  abnormal May need to be started on antihypertensive medication Dietary approaches to stop hypertension discussed Cardiovascular risk associated with uncontrolled hypertension discussed      Relevant Orders   Comprehensive metabolic panel   Lipid panel     Nervous and Auditory   Dementia without behavioral disturbance    Stable Continues Aricept 10 mg daily        Other   Dyslipidemia    Stable chronic condition Continue atorvastatin 40 mg daily      Depression    Stable.  In remission.      Loss of weight    Stable.  Good appetite. Continue Marinol 10 mg twice a day      Patient Instructions  Hypertension, Adult High blood pressure (hypertension) is when the force of blood pumping through the arteries is too strong. The arteries are the blood vessels that carry blood from the heart throughout the body. Hypertension forces the heart to work harder to pump blood and may cause arteries to become narrow or stiff. Untreated or uncontrolled hypertension can lead to a heart attack, heart failure, a stroke, kidney disease, and other problems. A blood pressure reading consists of a higher number over a lower number. Ideally, your blood pressure should be below 120/80. The first ("top") number is called the systolic pressure. It is a measure of the pressure in your arteries as your heart beats. The second ("bottom") number is called the diastolic pressure. It is a measure of the pressure in your arteries as the heart relaxes. What are the causes? The exact cause of this condition is not known. There are some conditions that result in high blood pressure. What increases the risk? Certain factors may make you more likely to develop high blood pressure. Some of these risk factors are under your control, including: Smoking. Not getting enough exercise or physical activity. Being overweight. Having too much fat, sugar, calories, or salt (sodium) in your diet. Drinking too much  alcohol. Other risk factors include: Having a personal history of heart disease, diabetes, high cholesterol, or kidney disease. Stress. Having a family history of high blood pressure and high cholesterol. Having obstructive sleep apnea. Age. The risk increases with age. What are the signs or symptoms? High blood pressure may not cause symptoms. Very high blood pressure (hypertensive crisis) may cause: Headache. Fast or irregular heartbeats (palpitations). Shortness of breath. Nosebleed. Nausea and vomiting. Vision changes. Severe chest pain, dizziness, and seizures. How is this diagnosed? This condition is diagnosed by measuring your blood pressure while you are seated, with your arm resting on a flat surface, your legs uncrossed, and your feet flat on the floor. The cuff of the blood pressure monitor will be placed directly against the skin of your upper arm at the level of your heart. Blood pressure should be measured at least twice using the same arm. Certain conditions can cause a difference in blood pressure between your right and left arms. If you have a high blood pressure reading during one visit or you have normal blood pressure with other risk factors, you may be asked to: Return on a different day to have your blood pressure checked again. Monitor your blood pressure at home for 1 week or longer. If  you are diagnosed with hypertension, you may have other blood or imaging tests to help your health care provider understand your overall risk for other conditions. How is this treated? This condition is treated by making healthy lifestyle changes, such as eating healthy foods, exercising more, and reducing your alcohol intake. You may be referred for counseling on a healthy diet and physical activity. Your health care provider may prescribe medicine if lifestyle changes are not enough to get your blood pressure under control and if: Your systolic blood pressure is above 130. Your  diastolic blood pressure is above 80. Your personal target blood pressure may vary depending on your medical conditions, your age, and other factors. Follow these instructions at home: Eating and drinking  Eat a diet that is high in fiber and potassium, and low in sodium, added sugar, and fat. An example of this eating plan is called the DASH diet. DASH stands for Dietary Approaches to Stop Hypertension. To eat this way: Eat plenty of fresh fruits and vegetables. Try to fill one half of your plate at each meal with fruits and vegetables. Eat whole grains, such as whole-wheat pasta, brown rice, or whole-grain bread. Fill about one fourth of your plate with whole grains. Eat or drink low-fat dairy products, such as skim milk or low-fat yogurt. Avoid fatty cuts of meat, processed or cured meats, and poultry with skin. Fill about one fourth of your plate with lean proteins, such as fish, chicken without skin, beans, eggs, or tofu. Avoid pre-made and processed foods. These tend to be higher in sodium, added sugar, and fat. Reduce your daily sodium intake. Many people with hypertension should eat less than 1,500 mg of sodium a day. Do not drink alcohol if: Your health care provider tells you not to drink. You are pregnant, may be pregnant, or are planning to become pregnant. If you drink alcohol: Limit how much you have to: 0-1 drink a day for women. 0-2 drinks a day for men. Know how much alcohol is in your drink. In the [place] one drink equals one 12 oz bottle of beer (355 mL), one 5 oz glass of wine (148 mL), or one 1 oz glass of hard liquor (44 mL). Lifestyle  Work with your health care provider to maintain a healthy body weight or to lose weight. Ask what an ideal weight is for you. Get at least 30 minutes of exercise that causes your heart to beat faster (aerobic exercise) most days of the week. Activities may include walking, swimming, or biking. Include exercise to strengthen your muscles  (resistance exercise), such as Pilates or lifting weights, as part of your weekly exercise routine. Try to do these types of exercises for 30 minutes at least 3 days a week. Do not use any products that contain nicotine or tobacco. These products include cigarettes, chewing tobacco, and vaping devices, such as e-cigarettes. If you need help quitting, ask your health care provider. Monitor your blood pressure at home as told by your health care provider. Keep all follow-up visits. This is important. Medicines Take over-the-counter and prescription medicines only as told by your health care provider. Follow directions carefully. Blood pressure medicines must be taken as prescribed. Do not skip doses of blood pressure medicine. Doing this puts you at risk for problems and can make the medicine less effective. Ask your health care provider about side effects or reactions to medicines that you should watch for. Contact a health care provider if you: Think you are  having a reaction to a medicine you are taking. Have headaches that keep coming back (recurring). Feel dizzy. Have swelling in your ankles. Have trouble with your vision. Get help right away if you: Develop a severe headache or confusion. Have unusual weakness or numbness. Feel faint. Have severe pain in your chest or abdomen. Vomit repeatedly. Have trouble breathing. These symptoms may be an emergency. Get help right away. Call 911. Do not wait to see if the symptoms will go away. Do not drive yourself to the hospital. Summary Hypertension is when the force of blood pumping through your arteries is too strong. If this condition is not controlled, it may put you at risk for serious complications. Your personal target blood pressure may vary depending on your medical conditions, your age, and other factors. For most people, a normal blood pressure is less than 120/80. Hypertension is treated with lifestyle changes, medicines, or a  combination of both. Lifestyle changes include losing weight, eating a healthy, low-sodium diet, exercising more, and limiting alcohol. This information is not intended to replace advice given to you by your health care provider. Make sure you discuss any questions you have with your health care provider. Document Revised: 09/28/2021 Document Reviewed: 09/28/2021 Elsevier Patient Education  [place], MD [facility]

## 2023-03-07 ENCOUNTER — Other Ambulatory Visit: Payer: Self-pay | Admitting: Emergency Medicine

## 2023-03-07 DIAGNOSIS — I10 Essential (primary) hypertension: Secondary | ICD-10-CM

## 2023-03-07 MED ORDER — LOSARTAN POTASSIUM 50 MG PO TABS: 50.0000 mg | ORAL_TABLET | Freq: Every day | ORAL | 3 refills | Status: DC

## 2023-03-18 ENCOUNTER — Other Ambulatory Visit: Payer: Self-pay | Admitting: Neurology

## 2023-03-21 NOTE — Telephone Encounter (Signed)
Last seen on 01/13/22 Follow up scheduled on 07/13/23 Rx filled on 11/26/22 #90 tablets (90 day supply)

## 2023-03-28 ENCOUNTER — Telehealth: Payer: Self-pay | Admitting: Emergency Medicine

## 2023-03-28 NOTE — Telephone Encounter (Signed)
Called patient wife and was unable to leave message for patient in reference to paperwork. If patient wife calls back please inform her that paperwork was not received in the office. I have not seen anything come through the fax.

## 2023-03-28 NOTE — Telephone Encounter (Signed)
Paperwork is for age and disability to help with husband dementia.  Please send completed paperwork to [facility] at [address], [place], [place] [address].

## 2023-03-28 NOTE — Telephone Encounter (Signed)
Patient's wife called and said there was paperwork that needed to be filled out. It is supposed to be sent to [facility]. Patient's wife will call back with more information since she did not remember what the paperwork was for. Best callback is [phone].

## 2023-03-31 ENCOUNTER — Other Ambulatory Visit: Payer: Self-pay | Admitting: Emergency Medicine

## 2023-03-31 DIAGNOSIS — E785 Hyperlipidemia, unspecified: Secondary | ICD-10-CM

## 2023-06-15 DIAGNOSIS — U071 COVID-19: Secondary | ICD-10-CM | POA: Diagnosis not present

## 2023-06-15 DIAGNOSIS — R030 Elevated blood-pressure reading, without diagnosis of hypertension: Secondary | ICD-10-CM | POA: Diagnosis not present

## 2023-06-15 DIAGNOSIS — Z6821 Body mass index (BMI) 21.0-21.9, adult: Secondary | ICD-10-CM | POA: Diagnosis not present

## 2023-06-20 ENCOUNTER — Telehealth: Payer: Self-pay | Admitting: Emergency Medicine

## 2023-06-20 NOTE — Telephone Encounter (Signed)
Paperwork received from [facility] [place]. Placed in provider's box for completion.

## 2023-06-21 NOTE — Telephone Encounter (Signed)
Called patient and informed him that an appointment is needed to completed paperwork

## 2023-06-27 ENCOUNTER — Encounter: Payer: Self-pay | Admitting: Emergency Medicine

## 2023-06-27 ENCOUNTER — Ambulatory Visit (INDEPENDENT_AMBULATORY_CARE_PROVIDER_SITE_OTHER): Payer: Medicare Other | Admitting: Emergency Medicine

## 2023-06-27 VITALS — BP 136/70 | HR 73 | Temp 97.9°F | Ht 75.0 in | Wt 164.1 lb

## 2023-06-27 DIAGNOSIS — N1831 Chronic kidney disease, stage 3a: Secondary | ICD-10-CM | POA: Diagnosis not present

## 2023-06-27 DIAGNOSIS — F0390 Unspecified dementia without behavioral disturbance: Secondary | ICD-10-CM

## 2023-06-27 DIAGNOSIS — I10 Essential (primary) hypertension: Secondary | ICD-10-CM | POA: Diagnosis not present

## 2023-06-27 DIAGNOSIS — I2510 Atherosclerotic heart disease of native coronary artery without angina pectoris: Secondary | ICD-10-CM | POA: Diagnosis not present

## 2023-06-27 DIAGNOSIS — E785 Hyperlipidemia, unspecified: Secondary | ICD-10-CM | POA: Diagnosis not present

## 2023-06-27 LAB — COMPREHENSIVE METABOLIC PANEL
ALT: 20 U/L (ref 0–53)
AST: 16 U/L (ref 0–37)
Albumin: 4 g/dL (ref 3.5–5.2)
Alkaline Phosphatase: 54 U/L (ref 39–117)
BUN: 18 mg/dL (ref 6–23)
CO2: 33 mEq/L — ABNORMAL HIGH (ref 19–32)
Calcium: 9.7 mg/dL (ref 8.4–10.5)
Chloride: 104 mEq/L (ref 96–112)
Creatinine, Ser: 1.33 mg/dL (ref 0.40–1.50)
GFR: 51.59 mL/min — ABNORMAL LOW (ref >60.00)
Glucose, Bld: 90 mg/dL (ref 70–99)
Potassium: 4.1 mEq/L (ref 3.5–5.1)
Sodium: 141 mEq/L (ref 135–145)
Total Bilirubin: 0.7 mg/dL (ref 0.2–1.2)
Total Protein: 6.2 g/dL (ref 6.0–8.3)

## 2023-06-27 NOTE — Assessment & Plan Note (Signed)
Stable.  Continues Aricept 10 mg daily. 

## 2023-06-27 NOTE — Progress Notes (Signed)
Omar [name]. 77 y.o.   Chief Complaint  Patient presents with   Medical Management of Chronic Issues    F/u appt, patient wants his lab to be checked. Kidney function     HISTORY OF PRESENT ILLNESS: This is a 77 y.o. male here for 25-month follow-up of chronic medical conditions.  Accompanied by wife. Overall doing well.  Has no complaints or medical concerns today. BP Readings from Last 3 Encounters:  06/27/23 136/70  03/06/23 (!) 152/100  09/29/22 (!) 155/94   Wt Readings from Last 3 Encounters:  06/27/23 164 lb 2 oz (74.4 kg)  03/06/23 165 lb 6 oz (75 kg)  09/29/22 166 lb (75.3 kg)     HPI   Prior to Admission medications   Medication Sig Start Date End Date Taking? Authorizing Provider  atorvastatin (LIPITOR) 40 MG tablet TAKE 1 TABLET BY MOUTH EVERY DAY 03/31/23  Yes Conleigh Heinlein, Eilleen Kempf, MD  donepezil (ARICEPT) 10 MG tablet TAKE 1 TABLET BY MOUTH EVERYDAY AT BEDTIME 03/21/23  Yes Sater, Pearletha Furl, MD  dronabinol (MARINOL) 10 MG capsule TAKE 1 CAPSULE BY MOUTH TWICE DAILY BEFORE A MEAL 10/03/22  Yes Doreatha Massed, MD  losartan (COZAAR) 50 MG tablet Take 1 tablet (50 mg total) by mouth daily. 03/07/23  Yes Margarine Grosshans, Eilleen Kempf, MD  OVER THE COUNTER MEDICATION daily.   Yes [provider]  psyllium (METAMUCIL) 58.6 % powder Take 1 packet by mouth daily.   Yes Georgina Quint, MD  vardenafil (LEVITRA) 20 MG tablet Take 20 mg by mouth as needed.    02/29/12  [provider]    Allergies  Allergen Reactions   Sulfonamide Derivatives Other (See Comments)     blisters on skin    Patient Active Problem List   Diagnosis Date Noted   Essential hypertension 11/01/2021   History of colon cancer 06/17/2021   Lipoma of anterior chest wall 04/29/2021   Snoring 01/13/2021   Dementia without behavioral disturbance (HCC) 01/13/2021   Excessive daytime sleepiness 01/13/2021   Memory loss 09/08/2020   Numbness 09/08/2020   Gait disturbance  09/08/2020   Diverticulosis 04/04/2019   Internal hemorrhoids 04/04/2019   Loss of weight 01/01/2019   S/P right colectomy 08/31/2017   Polyneuropathy 11/21/2016   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   History of pulmonary embolism 07/18/2014   Adrenal adenoma 03/18/2014   Coronary atherosclerosis of native coronary artery 03/18/2014   Hyperlipidemia 11/13/2013   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/2013   Prostate cancer (HCC) 11/09/2012   Stricture and stenosis of esophagus 05/31/2012   Leukopenia 05/27/2009   Esophageal reflux 10/09/2007   Dyslipidemia 07/16/2007   Depression 07/16/2007   COLONIC POLYPS, HX OF 07/16/2007    Past Medical History:  Diagnosis Date   Anxiety    BPH (benign prostatic hypertrophy)    Decreased white blood cell count    Depression    Diverticulosis    Erectile dysfunction    Gastric polyps    GERD (gastroesophageal reflux disease)    History of colonic polyps    History of esophageal stricture 10/31/2007   History of kidney stones    History of prostate surgery    History of rheumatic fever    Hyperlipidemia    Hypertension    Prostate cancer (HCC) 11/09/2012   Prostatism     Past Surgical History:  Procedure Laterality Date   CARPAL TUNNEL RELEASE     Bilateral   ESOPHAGEAL DILATION  last dilation 8'12   INGUINAL HERNIA REPAIR     INSERTION OF MESH N/A 04/15/2013   Procedure: INSERTION OF MESH;  Surgeon: Adolph Pollack, MD;  Location: [facility] OR;  Service: General;  Laterality: N/A;   KNEE ARTHROSCOPY     left   LAPAROSCOPIC RIGHT HEMI COLECTOMY Right 08/31/2017   Procedure: LAPAROSCOPIC ASSISTED  RIGHT HEMI COLECTOMY;  Surgeon: Luretha Murphy, MD;  Location: [facility] ORS;  Service: General;  Laterality: Right;   LEFT HEART CATHETERIZATION WITH CORONARY ANGIOGRAM N/A 07/18/2014   Procedure: LEFT HEART CATHETERIZATION WITH CORONARY ANGIOGRAM;  Surgeon: Kathleene Hazel, MD;  Location: [facility] CATH LAB;  Service:  Cardiovascular;  Laterality: N/A;   ROBOT ASSISTED LAPAROSCOPIC RADICAL PROSTATECTOMY  11/08/2012   Procedure: ROBOTIC ASSISTED LAPAROSCOPIC RADICAL PROSTATECTOMY LEVEL 1;  Surgeon: Milford Cage, MD;  Location: [facility] ORS;  Service: Urology;  Laterality: N/A;      TONSILLECTOMY     UMBILICAL HERNIA REPAIR N/A 04/15/2013   Procedure: HERNIA REPAIR UMBILICAL ADULT;  Surgeon: Adolph Pollack, MD;  Location: [facility] OR;  Service: General;  Laterality: N/A;    Social History   Socioeconomic History   Marital status: Married    Spouse name: Not on file   Number of children: 4   Years of education: Not on file   Highest education level: Not on file  Occupational History   Occupation: Education officer, environmental    Employer: [employer]  Tobacco Use   Smoking status: Former    Current packs/day: 0.00    Average packs/day: 1 pack/day for 25.0 years (25.0 ttl pk-yrs)    Types: Cigarettes    Start date: 12/05/1968    Quit date: 12/05/1993    Years since quitting: 29.5   Smokeless tobacco: Never  Vaping Use   Vaping status: Never Used  Substance and Sexual Activity   Alcohol use: No   Drug use: No   Sexual activity: Yes  Other Topics Concern   Not on file  Social History Narrative   Lives with wife in a one story home.  Has 4 children.  Works as a Education officer, environmental.     Education: 10th grade.    Caffeine use: soda (one per day)   Right handed   Social Determinants of Health   Financial Resource Strain: Low Risk  (11/08/2021)   Overall Financial Resource Strain (CARDIA)    Difficulty of Paying Living Expenses: Not hard at all  Food Insecurity: No Food Insecurity (06/08/2022)   Hunger Vital Sign    Worried About Running Out of Food in the Last Year: Never true    Ran Out of Food in the Last Year: Never true  Transportation Needs: No Transportation Needs (06/08/2022)   PRAPARE - Administrator, Civil Service (Medical): No    Lack of Transportation (Non-Medical): No  Physical Activity:  Sufficiently Active (09/22/2021)   Exercise Vital Sign    Days of Exercise per Week: 5 days    Minutes of Exercise per Session: 30 min  Stress: No Stress Concern Present (09/22/2021)   [employer] of Occupational Health - Occupational Stress Questionnaire    Feeling of Stress : Not at all  Social Connections: Socially Integrated (09/22/2021)   Social Connection and Isolation Panel [NHANES]    Frequency of Communication with Friends and Family: More than three times a week    Frequency of Social Gatherings with Friends and Family: More than three times a week    Attends Religious  Services: More than 4 times per year    Active Member of Clubs or Organizations: Yes    Attends Banker Meetings: More than 4 times per year    Marital Status: Married  Catering manager Violence: Not At Risk (09/22/2021)   Humiliation, Afraid, Rape, and Kick questionnaire    Fear of Current or Ex-Partner: No    Emotionally Abused: No    Physically Abused: No    Sexually Abused: No    Family History  Problem Relation Age of Onset   Ovarian cancer Mother    Colon cancer Mother 48   High Cholesterol Mother    Stroke Father    Lung cancer Maternal Uncle    Lung cancer Maternal Aunt    Esophageal cancer Neg Hx    Stomach cancer Neg Hx    Rectal cancer Neg Hx    Pancreatic cancer Neg Hx    Liver disease Neg Hx      Review of Systems  Constitutional: Negative.  Negative for chills and fever.  HENT: Negative.  Negative for congestion and sore throat.   Respiratory: Negative.  Negative for cough and shortness of breath.   Cardiovascular: Negative.  Negative for chest pain.  Gastrointestinal:  Negative for abdominal pain, diarrhea, nausea and vomiting.  Genitourinary: Negative.  Negative for dysuria and hematuria.  Skin: Negative.  Negative for rash.  Neurological: Negative.  Negative for dizziness and headaches.  All other systems reviewed and are negative.   Vitals:   06/27/23  1028  BP: 136/70  Pulse: 73  Temp: 97.9 F (36.6 C)  SpO2: 97%    Physical Exam Vitals reviewed.  Constitutional:      Appearance: Normal appearance.  HENT:     Head: Normocephalic.     Mouth/Throat:     Mouth: Mucous membranes are moist.     Pharynx: Oropharynx is clear.  Eyes:     Extraocular Movements: Extraocular movements intact.     Pupils: Pupils are equal, round, and reactive to light.  Cardiovascular:     Rate and Rhythm: Normal rate and regular rhythm.     Pulses: Normal pulses.     Heart sounds: Normal heart sounds.  Pulmonary:     Effort: Pulmonary effort is normal.     Breath sounds: Normal breath sounds.  Abdominal:     Palpations: Abdomen is soft.     Tenderness: There is no abdominal tenderness.  Musculoskeletal:     Cervical back: No tenderness.  Lymphadenopathy:     Cervical: No cervical adenopathy.  Skin:    General: Skin is warm and dry.     Capillary Refill: Capillary refill takes less than 2 seconds.  Neurological:     General: No focal deficit present.     Mental Status: He is alert and oriented to person, place, and time.  Psychiatric:        Mood and Affect: Mood normal.        Behavior: Behavior normal.      ASSESSMENT & PLAN: A total of 46 minutes was spent with the patient and counseling/coordination of care regarding preparing for this visit, review of most recent office visit notes, review of multiple chronic medical conditions under management, review of all medications, review of most recent blood work results, education on nutrition, review of health maintenance items, prognosis, documentation, and need for follow-up.  Problem List Items Addressed This Visit       Cardiovascular and Mediastinum   Coronary atherosclerosis  of native coronary artery    Stable.  No recent anginal episodes.      Essential hypertension - Primary    BP Readings from Last 3 Encounters:  06/27/23 136/70  03/06/23 (!) 152/100  09/29/22 (!) 155/94   Well-controlled hypertension off medications Cardiovascular risk associated with hypertension discussed Diet and nutrition discussed Not taking losartan per wife.       Relevant Orders   Comprehensive metabolic panel     Nervous and Auditory   Dementia without behavioral disturbance (HCC)    Stable.  Continues Aricept 10 mg daily        Genitourinary   Stage 3a chronic kidney disease (HCC)    CMP done today. Chronic stable condition. Advised to stay well-hydrated and avoid NSAIDs        Other   Dyslipidemia    Stable chronic condition Diet and nutrition discussed Continue atorvastatin 40 mg daily.      Patient Instructions  Health Maintenance After Age 66 After age 18, you are at a higher risk for certain Strieter-term diseases and infections as well as injuries from falls. Falls are a major cause of broken bones and head injuries in people who are older than age 13. Getting regular preventive care can help to keep you healthy and well. Preventive care includes getting regular testing and making lifestyle changes as recommended by your health care provider. Talk with your health care provider about: Which screenings and tests you should have. A screening is a test that checks for a disease when you have no symptoms. A diet and exercise plan that is right for you. What should I know about screenings and tests to prevent falls? Screening and testing are the best ways to find a health problem early. Early diagnosis and treatment give you the best chance of managing medical conditions that are common after age 49. Certain conditions and lifestyle choices may make you more likely to have a fall. Your health care provider may recommend: Regular vision checks. Poor vision and conditions such as cataracts can make you more likely to have a fall. If you wear glasses, make sure to get your prescription updated if your vision changes. Medicine review. Work with your health care provider to  regularly review all of the medicines you are taking, including over-the-counter medicines. Ask your health care provider about any side effects that may make you more likely to have a fall. Tell your health care provider if any medicines that you take make you feel dizzy or sleepy. Strength and balance checks. Your health care provider may recommend certain tests to check your strength and balance while standing, walking, or changing positions. Foot health exam. Foot pain and numbness, as well as not wearing proper footwear, can make you more likely to have a fall. Screenings, including: Osteoporosis screening. Osteoporosis is a condition that causes the bones to get weaker and break more easily. Blood pressure screening. Blood pressure changes and medicines to control blood pressure can make you feel dizzy. Depression screening. You may be more likely to have a fall if you have a fear of falling, feel depressed, or feel unable to do activities that you used to do. Alcohol use screening. Using too much alcohol can affect your balance and may make you more likely to have a fall. Follow these instructions at home: Lifestyle Do not drink alcohol if: Your health care provider tells you not to drink. If you drink alcohol: Limit how much you have to: 0-1  drink a day for women. 0-2 drinks a day for men. Know how much alcohol is in your drink. In the [place] one drink equals one 12 oz bottle of beer (355 mL), one 5 oz glass of wine (148 mL), or one 1 oz glass of hard liquor (44 mL). Do not use any products that contain nicotine or tobacco. These products include cigarettes, chewing tobacco, and vaping devices, such as e-cigarettes. If you need help quitting, ask your health care provider. Activity  Follow a regular exercise program to stay fit. This will help you maintain your balance. Ask your health care provider what types of exercise are appropriate for you. If you need a cane or walker, use it as  recommended by your health care provider. Wear supportive shoes that have nonskid soles. Safety  Remove any tripping hazards, such as rugs, cords, and clutter. Install safety equipment such as grab bars in bathrooms and safety rails on stairs. Keep rooms and walkways well-lit. General instructions Talk with your health care provider about your risks for falling. Tell your health care provider if: You fall. Be sure to tell your health care provider about all falls, even ones that seem minor. You feel dizzy, tiredness (fatigue), or off-balance. Take over-the-counter and prescription medicines only as told by your health care provider. These include supplements. Eat a healthy diet and maintain a healthy weight. A healthy diet includes low-fat dairy products, low-fat (lean) meats, and fiber from whole grains, beans, and lots of fruits and vegetables. Stay current with your vaccines. Schedule regular health, dental, and eye exams. Summary Having a healthy lifestyle and getting preventive care can help to protect your health and wellness after age 53. Screening and testing are the best way to find a health problem early and help you avoid having a fall. Early diagnosis and treatment give you the best chance for managing medical conditions that are more common for people who are older than age 48. Falls are a major cause of broken bones and head injuries in people who are older than age 25. Take precautions to prevent a fall at home. Work with your health care provider to learn what changes you can make to improve your health and wellness and to prevent falls. This information is not intended to replace advice given to you by your health care provider. Make sure you discuss any questions you have with your health care provider. Document Revised: 04/12/2021 Document Reviewed: 04/12/2021 Elsevier Patient Education  2024 Elsevier Inc.    Edwina Barth, MD [facility]

## 2023-06-27 NOTE — Patient Instructions (Signed)
Health Maintenance After Age 77 After age 77, you are at a higher risk for certain long-term diseases and infections as well as injuries from falls. Falls are a major cause of broken bones and head injuries in people who are older than age 77. Getting regular preventive care can help to keep you healthy and well. Preventive care includes getting regular testing and making lifestyle changes as recommended by your health care provider. Talk with your health care provider about: Which screenings and tests you should have. A screening is a test that checks for a disease when you have no symptoms. A diet and exercise plan that is right for you. What should I know about screenings and tests to prevent falls? Screening and testing are the best ways to find a health problem early. Early diagnosis and treatment give you the best chance of managing medical conditions that are common after age 77. Certain conditions and lifestyle choices may make you more likely to have a fall. Your health care provider may recommend: Regular vision checks. Poor vision and conditions such as cataracts can make you more likely to have a fall. If you wear glasses, make sure to get your prescription updated if your vision changes. Medicine review. Work with your health care provider to regularly review all of the medicines you are taking, including over-the-counter medicines. Ask your health care provider about any side effects that may make you more likely to have a fall. Tell your health care provider if any medicines that you take make you feel dizzy or sleepy. Strength and balance checks. Your health care provider may recommend certain tests to check your strength and balance while standing, walking, or changing positions. Foot health exam. Foot pain and numbness, as well as not wearing proper footwear, can make you more likely to have a fall. Screenings, including: Osteoporosis screening. Osteoporosis is a condition that causes  the bones to get weaker and break more easily. Blood pressure screening. Blood pressure changes and medicines to control blood pressure can make you feel dizzy. Depression screening. You may be more likely to have a fall if you have a fear of falling, feel depressed, or feel unable to do activities that you used to do. Alcohol use screening. Using too much alcohol can affect your balance and may make you more likely to have a fall. Follow these instructions at home: Lifestyle Do not drink alcohol if: Your health care provider tells you not to drink. If you drink alcohol: Limit how much you have to: 0-1 drink a day for women. 0-2 drinks a day for men. Know how much alcohol is in your drink. In the [place] one drink equals one 12 oz bottle of beer (355 mL), one 5 oz glass of wine (148 mL), or one 1 oz glass of hard liquor (44 mL). Do not use any products that contain nicotine or tobacco. These products include cigarettes, chewing tobacco, and vaping devices, such as e-cigarettes. If you need help quitting, ask your health care provider. Activity  Follow a regular exercise program to stay fit. This will help you maintain your balance. Ask your health care provider what types of exercise are appropriate for you. If you need a cane or walker, use it as recommended by your health care provider. Wear supportive shoes that have nonskid soles. Safety  Remove any tripping hazards, such as rugs, cords, and clutter. Install safety equipment such as grab bars in bathrooms and safety rails on stairs. Keep rooms and walkways   well-lit. General instructions Talk with your health care provider about your risks for falling. Tell your health care provider if: You fall. Be sure to tell your health care provider about all falls, even ones that seem minor. You feel dizzy, tiredness (fatigue), or off-balance. Take over-the-counter and prescription medicines only as told by your health care provider. These include  supplements. Eat a healthy diet and maintain a healthy weight. A healthy diet includes low-fat dairy products, low-fat (lean) meats, and fiber from whole grains, beans, and lots of fruits and vegetables. Stay current with your vaccines. Schedule regular health, dental, and eye exams. Summary Having a healthy lifestyle and getting preventive care can help to protect your health and wellness after age 77. Screening and testing are the best way to find a health problem early and help you avoid having a fall. Early diagnosis and treatment give you the best chance for managing medical conditions that are more common for people who are older than age 77. Falls are a major cause of broken bones and head injuries in people who are older than age 77. Take precautions to prevent a fall at home. Work with your health care provider to learn what changes you can make to improve your health and wellness and to prevent falls. This information is not intended to replace advice given to you by your health care provider. Make sure you discuss any questions you have with your health care provider. Document Revised: 04/12/2021 Document Reviewed: 04/12/2021 Elsevier Patient Education  2024 Elsevier Inc.  

## 2023-06-27 NOTE — Assessment & Plan Note (Signed)
BP Readings from Last 3 Encounters:  06/27/23 136/70  03/06/23 (!) 152/100  09/29/22 (!) 155/94  Well-controlled hypertension off medications Cardiovascular risk associated with hypertension discussed Diet and nutrition discussed Not taking losartan per wife.

## 2023-06-27 NOTE — Assessment & Plan Note (Signed)
CMP done today. Chronic stable condition. Advised to stay well-hydrated and avoid NSAIDs

## 2023-06-27 NOTE — Assessment & Plan Note (Signed)
Stable chronic condition.  Diet and nutrition discussed.  Continue atorvastatin 40 mg daily.

## 2023-06-27 NOTE — Assessment & Plan Note (Signed)
Stable.  No recent anginal episodes.

## 2023-07-11 NOTE — Telephone Encounter (Signed)
Called patient's wife and informed her that the form was completed and faxed on 7/29 with confirmation received.

## 2023-07-11 NOTE — Telephone Encounter (Signed)
Patient's wife if calling to see if this paperwork was completed yet.  Please call her at [phone]

## 2023-07-13 ENCOUNTER — Ambulatory Visit: Payer: Medicare Other | Admitting: Neurology

## 2023-07-20 ENCOUNTER — Other Ambulatory Visit: Payer: Self-pay | Admitting: Neurology

## 2023-07-20 NOTE — Telephone Encounter (Signed)
Pt last seen on 01/13/22 per note return as needed. No follow up scheduled

## 2023-09-05 ENCOUNTER — Ambulatory Visit (INDEPENDENT_AMBULATORY_CARE_PROVIDER_SITE_OTHER): Payer: Medicare Other | Admitting: Emergency Medicine

## 2023-09-05 ENCOUNTER — Encounter: Payer: Self-pay | Admitting: Emergency Medicine

## 2023-09-05 VITALS — BP 128/78 | HR 64 | Temp 98.0°F | Ht 75.0 in | Wt 161.4 lb

## 2023-09-05 DIAGNOSIS — N1831 Chronic kidney disease, stage 3a: Secondary | ICD-10-CM | POA: Diagnosis not present

## 2023-09-05 DIAGNOSIS — I10 Essential (primary) hypertension: Secondary | ICD-10-CM | POA: Diagnosis not present

## 2023-09-05 DIAGNOSIS — E785 Hyperlipidemia, unspecified: Secondary | ICD-10-CM

## 2023-09-05 DIAGNOSIS — I2510 Atherosclerotic heart disease of native coronary artery without angina pectoris: Secondary | ICD-10-CM

## 2023-09-05 DIAGNOSIS — F0390 Unspecified dementia without behavioral disturbance: Secondary | ICD-10-CM | POA: Diagnosis not present

## 2023-09-05 DIAGNOSIS — Z23 Encounter for immunization: Secondary | ICD-10-CM

## 2023-09-05 NOTE — Assessment & Plan Note (Signed)
Stable.  Continues Aricept 10 mg daily. 

## 2023-09-05 NOTE — Assessment & Plan Note (Signed)
Stable chronic condition.  No recent anginal episodes.

## 2023-09-05 NOTE — Patient Instructions (Signed)
Health Maintenance After Age 77 After age 77, you are at a higher risk for certain -term diseases and infections as well as injuries from falls. Falls are a major cause of broken bones and head injuries in people who are older than age 77. Getting regular preventive care can help to keep you healthy and well. Preventive care includes getting regular testing and making lifestyle changes as recommended by your health care provider. Talk with your health care provider about: Which screenings and tests you should have. A screening is a test that checks for a disease when you have no symptoms. A diet and exercise plan that is right for you. What should I know about screenings and tests to prevent falls? Screening and testing are the best ways to find a health problem early. Early diagnosis and treatment give you the best chance of managing medical conditions that are common after age 77. Certain conditions and lifestyle choices may make you more likely to have a fall. Your health care provider may recommend: Regular vision checks. Poor vision and conditions such as cataracts can make you more likely to have a fall. If you wear glasses, make sure to get your prescription updated if your vision changes. Medicine review. Work with your health care provider to regularly review all of the medicines you are taking, including over-the-counter medicines. Ask your health care provider about any side effects that may make you more likely to have a fall. Tell your health care provider if any medicines that you take make you feel dizzy or sleepy. Strength and balance checks. Your health care provider may recommend certain tests to check your strength and balance while standing, walking, or changing positions. Foot health exam. Foot pain and numbness, as well as not wearing proper footwear, can make you more likely to have a fall. Screenings, including: Osteoporosis screening. Osteoporosis is a condition that causes  the bones to get weaker and break more easily. Blood pressure screening. Blood pressure changes and medicines to control blood pressure can make you feel dizzy. Depression screening. You may be more likely to have a fall if you have a fear of falling, feel depressed, or feel unable to do activities that you used to do. Alcohol use screening. Using too much alcohol can affect your balance and may make you more likely to have a fall. Follow these instructions at home: Lifestyle Do not drink alcohol if: Your health care provider tells you not to drink. If you drink alcohol: Limit how much you have to: 0-1 drink a day for women. 0-2 drinks a day for men. Know how much alcohol is in your drink. In the [place] one drink equals one 12 oz bottle of beer (355 mL), one 5 oz glass of wine (148 mL), or one 1 oz glass of hard liquor (44 mL). Do not use any products that contain nicotine or tobacco. These products include cigarettes, chewing tobacco, and vaping devices, such as e-cigarettes. If you need help quitting, ask your health care provider. Activity  Follow a regular exercise program to stay fit. This will help you maintain your balance. Ask your health care provider what types of exercise are appropriate for you. If you need a cane or walker, use it as recommended by your health care provider. Wear supportive shoes that have nonskid soles. Safety  Remove any tripping hazards, such as rugs, cords, and clutter. Install safety equipment such as grab bars in bathrooms and safety rails on stairs. Keep rooms and walkways   well-lit. General instructions Talk with your health care provider about your risks for falling. Tell your health care provider if: You fall. Be sure to tell your health care provider about all falls, even ones that seem minor. You feel dizzy, tiredness (fatigue), or off-balance. Take over-the-counter and prescription medicines only as told by your health care provider. These include  supplements. Eat a healthy diet and maintain a healthy weight. A healthy diet includes low-fat dairy products, low-fat (lean) meats, and fiber from whole grains, beans, and lots of fruits and vegetables. Stay current with your vaccines. Schedule regular health, dental, and eye exams. Summary Having a healthy lifestyle and getting preventive care can help to protect your health and wellness after age 77. Screening and testing are the best way to find a health problem early and help you avoid having a fall. Early diagnosis and treatment give you the best chance for managing medical conditions that are more common for people who are older than age 77. Falls are a major cause of broken bones and head injuries in people who are older than age 77. Take precautions to prevent a fall at home. Work with your health care provider to learn what changes you can make to improve your health and wellness and to prevent falls. This information is not intended to replace advice given to you by your health care provider. Make sure you discuss any questions you have with your health care provider. Document Revised: 04/12/2021 Document Reviewed: 04/12/2021 Elsevier Patient Education  2024 Elsevier Inc.  

## 2023-09-05 NOTE — Assessment & Plan Note (Signed)
Chronic stable condition Continue atorvastatin 40 mg daily 

## 2023-09-05 NOTE — Progress Notes (Signed)
Omar [name]. 77 y.o.   Chief Complaint  Patient presents with   Medical Management of Chronic Issues    f/u appt, no concerns     HISTORY OF PRESENT ILLNESS: This is a 77 y.o. male accompanied by wife here for 3-month follow-up of chronic medical problems including hypertension and dyslipidemia Overall doing well.  Has no complaints or medical concerns today.  HPI   Prior to Admission medications   Medication Sig Start Date End Date Taking? Authorizing Provider  atorvastatin (LIPITOR) 40 MG tablet TAKE 1 TABLET BY MOUTH EVERY DAY 03/31/23  Yes Roan Miklos, Eilleen Kempf, MD  donepezil (ARICEPT) 10 MG tablet TAKE 1 TABLET BY MOUTH EVERYDAY AT BEDTIME 07/20/23  Yes Sater, Pearletha Furl, MD  dronabinol (MARINOL) 10 MG capsule TAKE 1 CAPSULE BY MOUTH TWICE DAILY BEFORE A MEAL 10/03/22  Yes Doreatha Massed, MD  losartan (COZAAR) 50 MG tablet Take 1 tablet (50 mg total) by mouth daily. 03/07/23  Yes Alizon Schmeling, Eilleen Kempf, MD  OVER THE COUNTER MEDICATION daily.   Yes [provider]  psyllium (METAMUCIL) 58.6 % powder Take 1 packet by mouth daily.   Yes Georgina Quint, MD  vardenafil (LEVITRA) 20 MG tablet Take 20 mg by mouth as needed.    02/29/12  [provider]    Allergies  Allergen Reactions   Sulfonamide Derivatives Other (See Comments)     blisters on skin    Patient Active Problem List   Diagnosis Date Noted   Stage 3a chronic kidney disease (HCC) 06/27/2023   Essential hypertension 11/01/2021   History of colon cancer 06/17/2021   Lipoma of anterior chest wall 04/29/2021   Snoring 01/13/2021   Dementia without behavioral disturbance (HCC) 01/13/2021   Excessive daytime sleepiness 01/13/2021   Memory loss 09/08/2020   Gait disturbance 09/08/2020   Diverticulosis 04/04/2019   Internal hemorrhoids 04/04/2019   Loss of weight 01/01/2019   S/P right colectomy 08/31/2017   Polyneuropathy 11/21/2016   Hereditary and idiopathic peripheral  neuropathy 09/06/2016   History of pulmonary embolism 07/18/2014   Adrenal adenoma 03/18/2014   Coronary atherosclerosis of native coronary artery 03/18/2014   Hyperlipidemia 11/13/2013   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/2013   Prostate cancer (HCC) 11/09/2012   Stricture and stenosis of esophagus 05/31/2012   Esophageal reflux 10/09/2007   Dyslipidemia 07/16/2007   Depression 07/16/2007   History of colonic polyps 07/16/2007    Past Medical History:  Diagnosis Date   Anxiety    BPH (benign prostatic hypertrophy)    Decreased white blood cell count    Depression    Diverticulosis    Erectile dysfunction    Gastric polyps    GERD (gastroesophageal reflux disease)    History of colonic polyps    History of esophageal stricture 10/31/2007   History of kidney stones    History of prostate surgery    History of rheumatic fever    Hyperlipidemia    Hypertension    Prostate cancer (HCC) 11/09/2012   Prostatism     Past Surgical History:  Procedure Laterality Date   CARPAL TUNNEL RELEASE     Bilateral   ESOPHAGEAL DILATION     last dilation 8'12   INGUINAL HERNIA REPAIR     INSERTION OF MESH N/A 04/15/2013   Procedure: INSERTION OF MESH;  Surgeon: Adolph Pollack, MD;  Location: [facility] OR;  Service: General;  Laterality: N/A;   KNEE ARTHROSCOPY     left   LAPAROSCOPIC  RIGHT HEMI COLECTOMY Right 08/31/2017   Procedure: LAPAROSCOPIC ASSISTED  RIGHT HEMI COLECTOMY;  Surgeon: Luretha Murphy, MD;  Location: [facility] ORS;  Service: General;  Laterality: Right;   LEFT HEART CATHETERIZATION WITH CORONARY ANGIOGRAM N/A 07/18/2014   Procedure: LEFT HEART CATHETERIZATION WITH CORONARY ANGIOGRAM;  Surgeon: Kathleene Hazel, MD;  Location: [facility] CATH LAB;  Service: Cardiovascular;  Laterality: N/A;   ROBOT ASSISTED LAPAROSCOPIC RADICAL PROSTATECTOMY  11/08/2012   Procedure: ROBOTIC ASSISTED LAPAROSCOPIC RADICAL PROSTATECTOMY LEVEL 1;  Surgeon: Milford Cage, MD;  Location: [facility] ORS;   Service: Urology;  Laterality: N/A;      TONSILLECTOMY     UMBILICAL HERNIA REPAIR N/A 04/15/2013   Procedure: HERNIA REPAIR UMBILICAL ADULT;  Surgeon: Adolph Pollack, MD;  Location: [facility] OR;  Service: General;  Laterality: N/A;    Social History   Socioeconomic History   Marital status: Married    Spouse name: Not on file   Number of children: 4   Years of education: Not on file   Highest education level: Not on file  Occupational History   Occupation: Education officer, environmental    Employer: [employer]  Tobacco Use   Smoking status: Former    Current packs/day: 0.00    Average packs/day: 1 pack/day for 25.0 years (25.0 ttl pk-yrs)    Types: Cigarettes    Start date: 12/05/1968    Quit date: 12/05/1993    Years since quitting: 29.7   Smokeless tobacco: Never  Vaping Use   Vaping status: Never Used  Substance and Sexual Activity   Alcohol use: No   Drug use: No   Sexual activity: Yes  Other Topics Concern   Not on file  Social History Narrative   Lives with wife in a one story home.  Has 4 children.  Works as a Education officer, environmental.     Education: 10th grade.    Caffeine use: soda (one per day)   Right handed   Social Determinants of Health   Financial Resource Strain: Low Risk  (11/08/2021)   Overall Financial Resource Strain (CARDIA)    Difficulty of Paying Living Expenses: Not hard at all  Food Insecurity: No Food Insecurity (06/08/2022)   Hunger Vital Sign    Worried About Running Out of Food in the Last Year: Never true    Ran Out of Food in the Last Year: Never true  Transportation Needs: No Transportation Needs (06/08/2022)   PRAPARE - Administrator, Civil Service (Medical): No    Lack of Transportation (Non-Medical): No  Physical Activity: Sufficiently Active (09/22/2021)   Exercise Vital Sign    Days of Exercise per Week: 5 days    Minutes of Exercise per Session: 30 min  Stress: No Stress Concern Present (09/22/2021)   [employer] of Occupational Health -  Occupational Stress Questionnaire    Feeling of Stress : Not at all  Social Connections: Socially Integrated (09/22/2021)   Social Connection and Isolation Panel [NHANES]    Frequency of Communication with Friends and Family: More than three times a week    Frequency of Social Gatherings with Friends and Family: More than three times a week    Attends Religious Services: More than 4 times per year    Active Member of [employer] or Organizations: Yes    Attends Banker Meetings: More than 4 times per year    Marital Status: Married  Catering manager Violence: Not At Risk (09/22/2021)   Humiliation, Afraid, Rape, and  Kick questionnaire    Fear of Current or Ex-Partner: No    Emotionally Abused: No    Physically Abused: No    Sexually Abused: No    Family History  Problem Relation Age of Onset   Ovarian cancer Mother    Colon cancer Mother 74   High Cholesterol Mother    Stroke Father    Lung cancer Maternal Uncle    Lung cancer Maternal Aunt    Esophageal cancer Neg Hx    Stomach cancer Neg Hx    Rectal cancer Neg Hx    Pancreatic cancer Neg Hx    Liver disease Neg Hx      Review of Systems  Constitutional: Negative.  Negative for chills and fever.  HENT: Negative.  Negative for congestion and sore throat.   Respiratory: Negative.  Negative for cough and shortness of breath.   Cardiovascular: Negative.  Negative for chest pain and palpitations.  Gastrointestinal:  Negative for abdominal pain, diarrhea, nausea and vomiting.  Genitourinary: Negative.  Negative for dysuria and hematuria.  Skin: Negative.  Negative for rash.  Neurological: Negative.  Negative for dizziness and headaches.  All other systems reviewed and are negative.   Vitals:   09/05/23 1510  BP: 128/78  Pulse: 64  Temp: 98 F (36.7 C)  SpO2: 95%    Physical Exam Vitals reviewed.  Constitutional:      Appearance: Normal appearance.  HENT:     Head: Normocephalic.     Mouth/Throat:      Mouth: Mucous membranes are moist.     Pharynx: Oropharynx is clear.  Eyes:     Extraocular Movements: Extraocular movements intact.     Conjunctiva/sclera: Conjunctivae normal.     Pupils: Pupils are equal, round, and reactive to light.  Cardiovascular:     Rate and Rhythm: Normal rate and regular rhythm.     Pulses: Normal pulses.     Heart sounds: Normal heart sounds.  Pulmonary:     Effort: Pulmonary effort is normal.     Breath sounds: Normal breath sounds.  Abdominal:     Palpations: Abdomen is soft.     Tenderness: There is no abdominal tenderness.  Musculoskeletal:     Cervical back: No tenderness.  Lymphadenopathy:     Cervical: No cervical adenopathy.  Skin:    General: Skin is warm and dry.     Capillary Refill: Capillary refill takes less than 2 seconds.  Neurological:     General: No focal deficit present.     Mental Status: He is alert and oriented to person, place, and time.  Psychiatric:        Mood and Affect: Mood normal.        Behavior: Behavior normal.      ASSESSMENT & PLAN: A total of 43 minutes was spent with the patient and counseling/coordination of care regarding preparing for this visit, review of most recent office visit notes, review of multiple chronic medical conditions under management, review of all medications, review of most recent blood work results, education on nutrition, cardiovascular risks associated with hypertension and dyslipidemia, prognosis, documentation, and need for follow-up.  Problem List Items Addressed This Visit       Cardiovascular and Mediastinum   Coronary atherosclerosis of native coronary artery    Stable chronic condition.  No recent anginal episodes.      Essential hypertension - Primary    BP Readings from Last 3 Encounters:  09/05/23 128/78  06/27/23 136/70  03/06/23 (!) 152/100  Well-controlled hypertension Continue losartan 50 mg daily         Nervous and Auditory   Dementia without behavioral  disturbance (HCC)    Stable.  Continues Aricept 10 mg daily        Genitourinary   Stage 3a chronic kidney disease (HCC)    Chronic stable condition Advised to stay well-hydrated and avoid NSAIDs        Other   Dyslipidemia    Chronic stable condition Continue atorvastatin 40 mg daily      Other Visit Diagnoses     Need for vaccination       Relevant Orders   Flu Vaccine Trivalent High Dose (Fluad) (Completed)        Patient Instructions  Health Maintenance After Age 76 After age 72, you are at a higher risk for certain [place]-term diseases and infections as well as injuries from falls. Falls are a major cause of broken bones and head injuries in people who are older than age 46. Getting regular preventive care can help to keep you healthy and well. Preventive care includes getting regular testing and making lifestyle changes as recommended by your health care provider. Talk with your health care provider about: Which screenings and tests you should have. A screening is a test that checks for a disease when you have no symptoms. A diet and exercise plan that is right for you. What should I know about screenings and tests to prevent falls? Screening and testing are the best ways to find a health problem early. Early diagnosis and treatment give you the best chance of managing medical conditions that are common after age 44. Certain conditions and lifestyle choices may make you more likely to have a fall. Your health care provider may recommend: Regular vision checks. Poor vision and conditions such as cataracts can make you more likely to have a fall. If you wear glasses, make sure to get your prescription updated if your vision changes. Medicine review. Work with your health care provider to regularly review all of the medicines you are taking, including over-the-counter medicines. Ask your health care provider about any side effects that may make you more likely to have a fall.  Tell your health care provider if any medicines that you take make you feel dizzy or sleepy. Strength and balance checks. Your health care provider may recommend certain tests to check your strength and balance while standing, walking, or changing positions. Foot health exam. Foot pain and numbness, as well as not wearing proper footwear, can make you more likely to have a fall. Screenings, including: Osteoporosis screening. Osteoporosis is a condition that causes the bones to get weaker and break more easily. Blood pressure screening. Blood pressure changes and medicines to control blood pressure can make you feel dizzy. Depression screening. You may be more likely to have a fall if you have a fear of falling, feel depressed, or feel unable to do activities that you used to do. Alcohol use screening. Using too much alcohol can affect your balance and may make you more likely to have a fall. Follow these instructions at home: Lifestyle Do not drink alcohol if: Your health care provider tells you not to drink. If you drink alcohol: Limit how much you have to: 0-1 drink a day for women. 0-2 drinks a day for men. Know how much alcohol is in your drink. In the [place] one drink equals one 12 oz bottle of beer (355 mL),  one 5 oz glass of wine (148 mL), or one 1 oz glass of hard liquor (44 mL). Do not use any products that contain nicotine or tobacco. These products include cigarettes, chewing tobacco, and vaping devices, such as e-cigarettes. If you need help quitting, ask your health care provider. Activity  Follow a regular exercise program to stay fit. This will help you maintain your balance. Ask your health care provider what types of exercise are appropriate for you. If you need a cane or walker, use it as recommended by your health care provider. Wear supportive shoes that have nonskid soles. Safety  Remove any tripping hazards, such as rugs, cords, and clutter. Install safety equipment  such as grab bars in bathrooms and safety rails on stairs. Keep rooms and walkways well-lit. General instructions Talk with your health care provider about your risks for falling. Tell your health care provider if: You fall. Be sure to tell your health care provider about all falls, even ones that seem minor. You feel dizzy, tiredness (fatigue), or off-balance. Take over-the-counter and prescription medicines only as told by your health care provider. These include supplements. Eat a healthy diet and maintain a healthy weight. A healthy diet includes low-fat dairy products, low-fat (lean) meats, and fiber from whole grains, beans, and lots of fruits and vegetables. Stay current with your vaccines. Schedule regular health, dental, and eye exams. Summary Having a healthy lifestyle and getting preventive care can help to protect your health and wellness after age 34. Screening and testing are the best way to find a health problem early and help you avoid having a fall. Early diagnosis and treatment give you the best chance for managing medical conditions that are more common for people who are older than age 40. Falls are a major cause of broken bones and head injuries in people who are older than age 21. Take precautions to prevent a fall at home. Work with your health care provider to learn what changes you can make to improve your health and wellness and to prevent falls. This information is not intended to replace advice given to you by your health care provider. Make sure you discuss any questions you have with your health care provider. Document Revised: 04/12/2021 Document Reviewed: 04/12/2021 Elsevier Patient Education  2024 Elsevier Inc.     Edwina Barth, MD [facility] at [facility]

## 2023-09-05 NOTE — Assessment & Plan Note (Signed)
BP Readings from Last 3 Encounters:  09/05/23 128/78  06/27/23 136/70  03/06/23 (!) 152/100  Well-controlled hypertension Continue losartan 50 mg daily

## 2023-09-05 NOTE — Assessment & Plan Note (Signed)
Chronic stable condition. Advised to stay well-hydrated and avoid NSAIDs

## 2023-09-26 ENCOUNTER — Other Ambulatory Visit: Payer: Self-pay

## 2023-09-26 DIAGNOSIS — D72819 Decreased white blood cell count, unspecified: Secondary | ICD-10-CM

## 2023-09-26 DIAGNOSIS — Z8546 Personal history of malignant neoplasm of prostate: Secondary | ICD-10-CM

## 2023-09-26 DIAGNOSIS — D508 Other iron deficiency anemias: Secondary | ICD-10-CM

## 2023-09-26 NOTE — Progress Notes (Signed)
Lab orders entered

## 2023-09-27 ENCOUNTER — Inpatient Hospital Stay: Payer: Medicare Other | Attending: Hematology | Admitting: Hematology

## 2023-09-27 ENCOUNTER — Other Ambulatory Visit: Payer: Self-pay

## 2023-09-27 ENCOUNTER — Encounter: Payer: Self-pay | Admitting: Hematology

## 2023-09-27 ENCOUNTER — Inpatient Hospital Stay: Payer: Medicare Other

## 2023-09-27 VITALS — BP 151/94 | HR 63 | Temp 97.6°F | Resp 16 | Wt 161.1 lb

## 2023-09-27 DIAGNOSIS — D709 Neutropenia, unspecified: Secondary | ICD-10-CM | POA: Diagnosis not present

## 2023-09-27 DIAGNOSIS — R5383 Other fatigue: Secondary | ICD-10-CM | POA: Diagnosis not present

## 2023-09-27 DIAGNOSIS — Z800 Family history of malignant neoplasm of digestive organs: Secondary | ICD-10-CM | POA: Insufficient documentation

## 2023-09-27 DIAGNOSIS — Z803 Family history of malignant neoplasm of breast: Secondary | ICD-10-CM | POA: Diagnosis not present

## 2023-09-27 DIAGNOSIS — Z8546 Personal history of malignant neoplasm of prostate: Secondary | ICD-10-CM | POA: Insufficient documentation

## 2023-09-27 DIAGNOSIS — F0390 Unspecified dementia without behavioral disturbance: Secondary | ICD-10-CM | POA: Diagnosis not present

## 2023-09-27 DIAGNOSIS — Z83438 Family history of other disorder of lipoprotein metabolism and other lipidemia: Secondary | ICD-10-CM | POA: Insufficient documentation

## 2023-09-27 DIAGNOSIS — D508 Other iron deficiency anemias: Secondary | ICD-10-CM | POA: Diagnosis not present

## 2023-09-27 DIAGNOSIS — D72819 Decreased white blood cell count, unspecified: Secondary | ICD-10-CM

## 2023-09-27 DIAGNOSIS — Z801 Family history of malignant neoplasm of trachea, bronchus and lung: Secondary | ICD-10-CM | POA: Insufficient documentation

## 2023-09-27 DIAGNOSIS — R634 Abnormal weight loss: Secondary | ICD-10-CM | POA: Insufficient documentation

## 2023-09-27 DIAGNOSIS — Z8041 Family history of malignant neoplasm of ovary: Secondary | ICD-10-CM | POA: Diagnosis not present

## 2023-09-27 DIAGNOSIS — Z87891 Personal history of nicotine dependence: Secondary | ICD-10-CM | POA: Insufficient documentation

## 2023-09-27 DIAGNOSIS — Z860100 Personal history of colon polyps, unspecified: Secondary | ICD-10-CM | POA: Insufficient documentation

## 2023-09-27 LAB — COMPREHENSIVE METABOLIC PANEL
ALT: 21 U/L (ref 0–44)
AST: 17 U/L (ref 15–41)
Albumin: 3.7 g/dL (ref 3.5–5.0)
Alkaline Phosphatase: 58 U/L (ref 38–126)
Anion gap: 6 (ref 5–15)
BUN: 14 mg/dL (ref 8–23)
CO2: 30 mmol/L (ref 22–32)
Calcium: 8.8 mg/dL — ABNORMAL LOW (ref 8.9–10.3)
Chloride: 100 mmol/L (ref 98–111)
Creatinine, Ser: 1.3 mg/dL — ABNORMAL HIGH (ref 0.61–1.24)
GFR, Estimated: 57 mL/min — ABNORMAL LOW (ref >60)
Glucose, Bld: 96 mg/dL (ref 70–99)
Potassium: 3.4 mmol/L — ABNORMAL LOW (ref 3.5–5.1)
Sodium: 136 mmol/L (ref 135–145)
Total Bilirubin: 0.8 mg/dL (ref 0.3–1.2)
Total Protein: 6.2 g/dL — ABNORMAL LOW (ref 6.5–8.1)

## 2023-09-27 LAB — CBC WITH DIFFERENTIAL/PLATELET
Abs Immature Granulocytes: 0.01 10*3/uL (ref 0.00–0.07)
Basophils Absolute: 0 10*3/uL (ref 0.0–0.1)
Basophils Relative: 1 %
Eosinophils Absolute: 0.2 10*3/uL (ref 0.0–0.5)
Eosinophils Relative: 7 %
HCT: 40 % (ref 39.0–52.0)
Hemoglobin: 13.1 g/dL (ref 13.0–17.0)
Immature Granulocytes: 0 %
Lymphocytes Relative: 35 %
Lymphs Abs: 0.9 10*3/uL (ref 0.7–4.0)
MCH: 27.8 pg (ref 26.0–34.0)
MCHC: 32.8 g/dL (ref 30.0–36.0)
MCV: 84.7 fL (ref 80.0–100.0)
Monocytes Absolute: 0.2 10*3/uL (ref 0.1–1.0)
Monocytes Relative: 9 %
Neutro Abs: 1.3 10*3/uL — ABNORMAL LOW (ref 1.7–7.7)
Neutrophils Relative %: 48 %
Platelets: 163 10*3/uL (ref 150–400)
RBC: 4.72 MIL/uL (ref 4.22–5.81)
RDW: 13.1 % (ref 11.5–15.5)
WBC: 2.6 10*3/uL — ABNORMAL LOW (ref 4.0–10.5)
nRBC: 0 % (ref 0.0–0.2)

## 2023-09-27 LAB — IRON AND TIBC
Iron: 88 ug/dL (ref 45–182)
Saturation Ratios: 28 % (ref 17.9–39.5)
TIBC: 313 ug/dL (ref 250–450)
UIBC: 225 ug/dL

## 2023-09-27 LAB — FOLATE: Folate: 11.6 ng/mL (ref >5.9)

## 2023-09-27 LAB — FERRITIN: Ferritin: 51 ng/mL (ref 24–336)

## 2023-09-27 LAB — VITAMIN B12: Vitamin B-12: 482 pg/mL (ref 180–914)

## 2023-09-27 NOTE — Patient Instructions (Signed)
[facility] at [facility] Discharge Instructions   You were seen and examined today by Dr. Ellin Saba.  He reviewed the results of your lab work which are normal/stable.   We will see you back in 1 year. We will repeat lab work prior this visit.   Return as scheduled.    Thank you for choosing [facility] at [facility] to provide your oncology and hematology care.  To afford each patient quality time with our provider, please arrive at least 15 minutes before your scheduled appointment time.   If you have a lab appointment with the Cancer Center please come in thru the Main Entrance and check in at the main information desk.  You need to re-schedule your appointment should you arrive 10 or more minutes late.  We strive to give you quality time with our providers, and arriving late affects you and other patients whose appointments are after yours.  Also, if you no show three or more times for appointments you may be dismissed from the clinic at the providers discretion.     Again, thank you for choosing [facility].  Our hope is that these requests will decrease the amount of time that you wait before being seen by our physicians.       _____________________________________________________________  Should you have questions after your visit to [facility], please contact our office at [phone] and follow the prompts.  Our office hours are 8:00 a.m. and 4:30 p.m. Monday - Friday.  Please note that voicemails left after 4:00 p.m. may not be returned until the following business day.  We are closed weekends and major holidays.  You do have access to a nurse 24-7, just call the main number to the clinic [phone] and do not press any options, hold on the line and a nurse will answer the phone.    For prescription refill requests, have your pharmacy contact our office and allow 72 hours.    Due to Covid, you will need  to wear a mask upon entering the hospital. If you do not have a mask, a mask will be given to you at the Main Entrance upon arrival. For doctor visits, patients may have 1 support person age 42 or older with them. For treatment visits, patients can not have anyone with them due to social distancing guidelines and our immunocompromised population.

## 2023-09-27 NOTE — Progress Notes (Signed)
[facility] [address] [place], [place] [address]   CLINIC:  Medical Oncology/Hematology  PCP:  Georgina Quint, MD [address] / [place] [address]  [phone]  REASON FOR VISIT:  Follow-up for leukopenia  PRIOR THERAPY: none  CURRENT THERAPY: surveillance  INTERVAL HISTORY:  Mr. Rawlings Scher., a 77 y.o. male, seen for follow-up of leukopenia.  He also has dementia.  He is not taking Marinol for more than 6 months.  He and his family denies any infections in the past 6 months.  Appetite is 50% and energy levels are 75%.  REVIEW OF SYSTEMS:  Review of Systems  Constitutional:  Positive for fatigue.  All other systems reviewed and are negative.   PAST MEDICAL/SURGICAL HISTORY:  Past Medical History:  Diagnosis Date   Anxiety    BPH (benign prostatic hypertrophy)    Decreased white blood cell count    Depression    Diverticulosis    Erectile dysfunction    Gastric polyps    GERD (gastroesophageal reflux disease)    History of colonic polyps    History of esophageal stricture 10/31/2007   History of kidney stones    History of prostate surgery    History of rheumatic fever    Hyperlipidemia    Hypertension    Prostate cancer (HCC) 11/09/2012   Prostatism    Past Surgical History:  Procedure Laterality Date   CARPAL TUNNEL RELEASE     Bilateral   ESOPHAGEAL DILATION     last dilation 8'12   INGUINAL HERNIA REPAIR     INSERTION OF MESH N/A 04/15/2013   Procedure: INSERTION OF MESH;  Surgeon: Adolph Pollack, MD;  Location: [facility] OR;  Service: General;  Laterality: N/A;   KNEE ARTHROSCOPY     left   LAPAROSCOPIC RIGHT HEMI COLECTOMY Right 08/31/2017   Procedure: LAPAROSCOPIC ASSISTED  RIGHT HEMI COLECTOMY;  Surgeon: Luretha Murphy, MD;  Location: [facility] ORS;  Service: General;  Laterality: Right;   LEFT HEART CATHETERIZATION WITH CORONARY ANGIOGRAM N/A 07/18/2014   Procedure: LEFT HEART CATHETERIZATION WITH CORONARY ANGIOGRAM;  Surgeon:  Kathleene Hazel, MD;  Location: [facility] CATH LAB;  Service: Cardiovascular;  Laterality: N/A;   ROBOT ASSISTED LAPAROSCOPIC RADICAL PROSTATECTOMY  11/08/2012   Procedure: ROBOTIC ASSISTED LAPAROSCOPIC RADICAL PROSTATECTOMY LEVEL 1;  Surgeon: Milford Cage, MD;  Location: [facility] ORS;  Service: Urology;  Laterality: N/A;      TONSILLECTOMY     UMBILICAL HERNIA REPAIR N/A 04/15/2013   Procedure: HERNIA REPAIR UMBILICAL ADULT;  Surgeon: Adolph Pollack, MD;  Location: [facility] OR;  Service: General;  Laterality: N/A;    SOCIAL HISTORY:  Social History   Socioeconomic History   Marital status: Married    Spouse name: Not on file   Number of children: 4   Years of education: Not on file   Highest education level: Not on file  Occupational History   Occupation: Education officer, environmental    Employer: [employer]  Tobacco Use   Smoking status: Former    Current packs/day: 0.00    Average packs/day: 1 pack/day for 25.0 years (25.0 ttl pk-yrs)    Types: Cigarettes    Start date: 12/05/1968    Quit date: 12/05/1993    Years since quitting: 29.8   Smokeless tobacco: Never  Vaping Use   Vaping status: Never Used  Substance and Sexual Activity   Alcohol use: No   Drug use: No   Sexual activity:  Yes  Other Topics Concern   Not on file  Social History Narrative   Lives with wife in a one story home.  Has 4 children.  Works as a Education officer, environmental.     Education: 10th grade.    Caffeine use: soda (one per day)   Right handed   Social Determinants of Health   Financial Resource Strain: Low Risk  (11/08/2021)   Overall Financial Resource Strain (CARDIA)    Difficulty of Paying Living Expenses: Not hard at all  Food Insecurity: No Food Insecurity (06/08/2022)   Hunger Vital Sign    Worried About Running Out of Food in the Last Year: Never true    Ran Out of Food in the Last Year: Never true  Transportation Needs: No Transportation Needs (06/08/2022)   PRAPARE - Administrator, Civil Service  (Medical): No    Lack of Transportation (Non-Medical): No  Physical Activity: Sufficiently Active (09/22/2021)   Exercise Vital Sign    Days of Exercise per Week: 5 days    Minutes of Exercise per Session: 30 min  Stress: No Stress Concern Present (09/22/2021)   [employer] of Occupational Health - Occupational Stress Questionnaire    Feeling of Stress : Not at all  Social Connections: Socially Integrated (09/22/2021)   Social Connection and Isolation Panel [NHANES]    Frequency of Communication with Friends and Family: More than three times a week    Frequency of Social Gatherings with Friends and Family: More than three times a week    Attends Religious Services: More than 4 times per year    Active Member of [employer] or Organizations: Yes    Attends Engineer, structural: More than 4 times per year    Marital Status: Married  Catering manager Violence: Not At Risk (09/22/2021)   Humiliation, Afraid, Rape, and Kick questionnaire    Fear of Current or Ex-Partner: No    Emotionally Abused: No    Physically Abused: No    Sexually Abused: No    FAMILY HISTORY:  Family History  Problem Relation Age of Onset   Ovarian cancer Mother    Colon cancer Mother 80   High Cholesterol Mother    Stroke Father    Lung cancer Maternal Uncle    Lung cancer Maternal Aunt    Esophageal cancer Neg Hx    Stomach cancer Neg Hx    Rectal cancer Neg Hx    Pancreatic cancer Neg Hx    Liver disease Neg Hx     CURRENT MEDICATIONS:  Current Outpatient Medications  Medication Sig Dispense Refill   atorvastatin (LIPITOR) 40 MG tablet TAKE 1 TABLET BY MOUTH EVERY DAY 90 tablet 3   donepezil (ARICEPT) 10 MG tablet TAKE 1 TABLET BY MOUTH EVERYDAY AT BEDTIME 90 tablet 1   losartan (COZAAR) 50 MG tablet Take 1 tablet (50 mg total) by mouth daily. 90 tablet 3   psyllium (METAMUCIL) 58.6 % powder Take 1 packet by mouth daily.     dronabinol (MARINOL) 10 MG capsule TAKE 1 CAPSULE BY MOUTH TWICE  DAILY BEFORE A MEAL (Patient not taking: Reported on 09/27/2023) 60 capsule 5   OVER THE COUNTER MEDICATION daily. (Patient not taking: Reported on 09/27/2023)     No current facility-administered medications for this visit.    ALLERGIES:  Allergies  Allergen Reactions   Sulfonamide Derivatives Other (See Comments)     blisters on skin    PHYSICAL EXAM:  Performance status (ECOG): 0 -  Asymptomatic  Vitals:   09/27/23 1437  BP: (!) 151/94  Pulse: 63  Resp: 16  Temp: 97.6 F (36.4 C)  SpO2: 100%   Wt Readings from Last 3 Encounters:  09/27/23 161 lb 1.6 oz (73.1 kg)  09/05/23 161 lb 6 oz (73.2 kg)  06/27/23 164 lb 2 oz (74.4 kg)   Physical Exam Vitals reviewed.  Constitutional:      Appearance: Normal appearance.  Cardiovascular:     Heart sounds: Normal heart sounds.  Pulmonary:     Effort: Pulmonary effort is normal.     Breath sounds: Normal breath sounds.  Abdominal:     Palpations: Abdomen is soft. There is no mass.  Lymphadenopathy:     Cervical: No cervical adenopathy.  Neurological:     Mental Status: He is alert.     LABORATORY DATA:  I have reviewed the labs as listed.     Latest Ref Rng & Units 09/27/2023    1:13 PM 09/27/2022   11:42 AM 07/16/2021   11:58 AM  CBC  WBC 4.0 - 10.5 K/uL 2.6  2.9  2.4   Hemoglobin 13.0 - 17.0 g/dL 87.5  64.3  32.9   Hematocrit 39.0 - 52.0 % 40.0  42.2  43.6    42.4   Platelets 150 - 400 K/uL 163  150  169       Latest Ref Rng & Units 09/27/2023    1:13 PM 06/27/2023   11:06 AM 03/06/2023    3:08 PM  CMP  Glucose 70 - 99 mg/dL 96  90  80   BUN 8 - 23 mg/dL 14  18  19    Creatinine 0.61 - 1.24 mg/dL 5.18  8.41  6.60   Sodium 135 - 145 mmol/L 136  141  138   Potassium 3.5 - 5.1 mmol/L 3.4  4.1  4.2   Chloride 98 - 111 mmol/L 100  104  105   CO2 22 - 32 mmol/L 30  33  32   Calcium 8.9 - 10.3 mg/dL 8.8  9.7  9.5   Total Protein 6.5 - 8.1 g/dL 6.2  6.2  6.2   Total Bilirubin 0.3 - 1.2 mg/dL 0.8  0.7  0.7    Alkaline Phos 38 - 126 U/L 58  54  52   AST 15 - 41 U/L 17  16  17    ALT 0 - 44 U/L 21  20  18        Component Value Date/Time   RBC 4.72 09/27/2023 1313   MCV 84.7 09/27/2023 1313   MCV 83 05/26/2020 1506   MCH 27.8 09/27/2023 1313   MCHC 32.8 09/27/2023 1313   RDW 13.1 09/27/2023 1313   RDW 13.0 05/26/2020 1506   LYMPHSABS 0.9 09/27/2023 1313   LYMPHSABS 1.1 05/26/2020 1506   MONOABS 0.2 09/27/2023 1313   EOSABS 0.2 09/27/2023 1313   EOSABS 0.2 05/26/2020 1506   BASOSABS 0.0 09/27/2023 1313   BASOSABS 0.1 05/26/2020 1506    DIAGNOSTIC IMAGING:  I have independently reviewed the scans and discussed with the patient. No results found.   ASSESSMENT:  1.  Leukopenia and neutropenia: - He was evaluated for leukopenia and CBC on 06/17/2021 showed white count 2.6 with ANC of 1.2. 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 - Work-up on 07/16/2021 showed white count 2.4 with ANC of 1.1.  ANA was negative.  B12 and folic acid were normal.  SPEP,  immunofixation were negative on 09/08/2020.  2.  Social/family history: - He worked at [employer].  He also worked as a Education officer, environmental and currently retired. - He does all his ADLs and IADLs and drives. - Quit smoking more than 20 years ago. - Mother had colon cancer.  Brother had cancer.  Daughter had breast cancer.  Maternal uncle also had cancer, patient does not know type.  3.  Prostate cancer: - He underwent prostatectomy on 11/08/2012. - Pathology showed Gleason score 3+4= 7, [id number]. - Last PSA on 07/16/2021 was undetectable.   PLAN:  1.  Leukopenia and neutropenia: - Previous workup for nutritional deficiencies, connective tissue disorders and plasma cell disorders was negative. - Denied any recurrent infections. - Labs from 09/27/2023: Creatinine 1.30 and LFTs normal.  White count is 2.6 with ANC of 1.3 which is stable.  Hemoglobin and platelets are normal.  Ferritin is 51, B12 is 482 and folic acid was  normal. - No indication for further testing at this time.  RTC 1 year for follow-up with repeat CBC with differential.  2.  Weight loss: - He stopped taking Marinol 6 months ago. - His weight is more or less stable in the last 1 year.  Orders placed this encounter:  No orders of the defined types were placed in this encounter.    Doreatha Massed, MD [facility] [phone]

## 2023-12-22 ENCOUNTER — Ambulatory Visit (INDEPENDENT_AMBULATORY_CARE_PROVIDER_SITE_OTHER): Payer: Medicare HMO

## 2023-12-22 DIAGNOSIS — Z0000 Encounter for general adult medical examination without abnormal findings: Secondary | ICD-10-CM

## 2023-12-22 NOTE — Progress Notes (Signed)
Subjective:   Omar [name]. is a 78 y.o. male who presents for Medicare Annual/Subsequent preventive examination.  Visit Complete: Virtual I connected with  Omar [name]. on 12/22/23 by a audio enabled telemedicine application and verified that I am speaking with the correct person using two identifiers.  Wife also on the call  Interactive audio and video telecommunications were attempted between this provider and patient, however failed, due to patient having technical difficulties OR patient did not have access to video capability.  We continued and completed visit with audio only.  Patient Location: Home  Provider Location: Home Office  I discussed the limitations of evaluation and management by telemedicine. The patient expressed understanding and agreed to proceed.  Vital Signs: Because this visit was a virtual/telehealth visit, some criteria may be missing or patient reported. Any vitals not documented were not able to be obtained and vitals that have been documented are patient reported.    Cardiac Risk Factors include: advanced age (>10men, >76 women);dyslipidemia;hypertension;male gender     Objective:    Today's Vitals   There is no height or weight on file to calculate BMI.     12/22/2023   10:23 AM 09/27/2023    2:40 PM 09/29/2022    1:55 PM 09/22/2021    9:28 AM 09/09/2021   11:29 AM 08/05/2021    3:50 PM 07/16/2021   11:04 AM  Advanced Directives  Does Patient Have a Medical Advance Directive? No No No Yes Yes Yes Yes  Type of Theme park manager;Living will Healthcare Power of [place];Living will Healthcare Power of [place];Living will Healthcare Power of [place];Living will  Does patient want to make changes to medical advance directive?     No - Patient declined No - Patient declined No - Patient declined  Copy of Healthcare Power of Attorney in Chart?    No - copy requested No - copy requested No - copy requested    Would patient like information on creating a medical advance directive?  No - Patient declined No - Patient declined        Current Medications (verified) Outpatient Encounter Medications as of 12/22/2023  Medication Sig   atorvastatin (LIPITOR) 40 MG tablet TAKE 1 TABLET BY MOUTH EVERY DAY   donepezil (ARICEPT) 10 MG tablet TAKE 1 TABLET BY MOUTH EVERYDAY AT BEDTIME   losartan (COZAAR) 50 MG tablet Take 1 tablet (50 mg total) by mouth daily.   psyllium (METAMUCIL) 58.6 % powder Take 1 packet by mouth daily.   dronabinol (MARINOL) 10 MG capsule TAKE 1 CAPSULE BY MOUTH TWICE DAILY BEFORE A MEAL (Patient not taking: Reported on 12/22/2023)   OVER THE COUNTER MEDICATION daily. (Patient not taking: Reported on 12/22/2023)   [DISCONTINUED] vardenafil (LEVITRA) 20 MG tablet Take 20 mg by mouth as needed.     No facility-administered encounter medications on file as of 12/22/2023.    Allergies (verified) Sulfonamide derivatives   History: Past Medical History:  Diagnosis Date   Anxiety    BPH (benign prostatic hypertrophy)    Decreased white blood cell count    Depression    Diverticulosis    Erectile dysfunction    Gastric polyps    GERD (gastroesophageal reflux disease)    History of colonic polyps    History of esophageal stricture 10/31/2007   History of kidney stones    History of prostate surgery    History of rheumatic fever    Hyperlipidemia  Hypertension    Prostate cancer (HCC) 11/09/2012   Prostatism    Past Surgical History:  Procedure Laterality Date   CARPAL TUNNEL RELEASE     Bilateral   ESOPHAGEAL DILATION     last dilation 8'12   INGUINAL HERNIA REPAIR     INSERTION OF MESH N/A 04/15/2013   Procedure: INSERTION OF MESH;  Surgeon: Adolph Pollack, MD;  Location: [facility] OR;  Service: General;  Laterality: N/A;   KNEE ARTHROSCOPY     left   LAPAROSCOPIC RIGHT HEMI COLECTOMY Right 08/31/2017   Procedure: LAPAROSCOPIC ASSISTED  RIGHT HEMI COLECTOMY;  Surgeon:  Luretha Murphy, MD;  Location: [facility] ORS;  Service: General;  Laterality: Right;   LEFT HEART CATHETERIZATION WITH CORONARY ANGIOGRAM N/A 07/18/2014   Procedure: LEFT HEART CATHETERIZATION WITH CORONARY ANGIOGRAM;  Surgeon: Kathleene Hazel, MD;  Location: [facility] CATH LAB;  Service: Cardiovascular;  Laterality: N/A;   ROBOT ASSISTED LAPAROSCOPIC RADICAL PROSTATECTOMY  11/08/2012   Procedure: ROBOTIC ASSISTED LAPAROSCOPIC RADICAL PROSTATECTOMY LEVEL 1;  Surgeon: Milford Cage, MD;  Location: [facility] ORS;  Service: Urology;  Laterality: N/A;      TONSILLECTOMY     UMBILICAL HERNIA REPAIR N/A 04/15/2013   Procedure: HERNIA REPAIR UMBILICAL ADULT;  Surgeon: Adolph Pollack, MD;  Location: [facility] OR;  Service: General;  Laterality: N/A;   Family History  Problem Relation Age of Onset   Ovarian cancer Mother    Colon cancer Mother 71   High Cholesterol Mother    Stroke Father    Lung cancer Maternal Uncle    Lung cancer Maternal Aunt    Esophageal cancer Neg Hx    Stomach cancer Neg Hx    Rectal cancer Neg Hx    Pancreatic cancer Neg Hx    Liver disease Neg Hx    Social History   Socioeconomic History   Marital status: Married    Spouse name: Not on file   Number of children: 4   Years of education: Not on file   Highest education level: Not on file  Occupational History   Occupation: Education officer, environmental    Employer: [employer]  Tobacco Use   Smoking status: Former    Current packs/day: 0.00    Average packs/day: 1 pack/day for 25.0 years (25.0 ttl pk-yrs)    Types: Cigarettes    Start date: 12/05/1968    Quit date: 12/05/1993    Years since quitting: 30.0   Smokeless tobacco: Never  Vaping Use   Vaping status: Never Used  Substance and Sexual Activity   Alcohol use: No   Drug use: No   Sexual activity: Yes  Other Topics Concern   Not on file  Social History Narrative   Lives with wife in a one story home.  Has 4 children.  Works as a Education officer, environmental.     Education: 10th grade.     Caffeine use: soda (one per day)   Right handed   Social Drivers of Health   Financial Resource Strain: Low Risk  (12/22/2023)   Overall Financial Resource Strain (CARDIA)    Difficulty of Paying Living Expenses: Not hard at all  Food Insecurity: No Food Insecurity (12/22/2023)   Hunger Vital Sign    Worried About Running Out of Food in the Last Year: Never true    Ran Out of Food in the Last Year: Never true  Transportation Needs: No Transportation Needs (12/22/2023)   PRAPARE - Administrator, Civil Service (Medical):  No    Lack of Transportation (Non-Medical): No  Physical Activity: Inactive (12/22/2023)   Exercise Vital Sign    Days of Exercise per Week: 0 days    Minutes of Exercise per Session: 0 min  Stress: No Stress Concern Present (12/22/2023)   [employer] of Occupational Health - Occupational Stress Questionnaire    Feeling of Stress : Not at all  Social Connections: Moderately Isolated (12/22/2023)   Social Connection and Isolation Panel [NHANES]    Frequency of Communication with Friends and Family: Twice a week    Frequency of Social Gatherings with Friends and Family: Never    Attends Religious Services: More than 4 times per year    Active Member of [employer] or Organizations: No    Attends Engineer, structural: Never    Marital Status: Married    Tobacco Counseling Counseling given: Not Answered   Clinical Intake:  Pre-visit preparation completed: Yes  Pain : No/denies pain     Nutritional Risks: None Diabetes: No  How often do you need to have someone help you when you read instructions, pamphlets, or other written materials from your doctor or pharmacy?: 3 - Sometimes  Interpreter Needed?: No  Information entered by :: NAllen LPN   Activities of Daily Living    12/22/2023   10:17 AM  In your present state of health, do you have any difficulty performing the following activities:  Hearing? 0  Vision? 0  Difficulty  concentrating or making decisions? 1  Comment dementia  Walking or climbing stairs? 0  Dressing or bathing? 0  Doing errands, shopping? 1  Preparing Food and eating ? N  Using the Toilet? N  In the past six months, have you accidently leaked urine? N  Do you have problems with loss of bowel control? N  Managing your Medications? Y  Managing your Finances? Y  Housekeeping or managing your Housekeeping? N    Patient Care Team: Georgina Quint, MD as PCP - General (Internal Medicine) Natalia Leatherwood, MD as Attending Physician (Urology) Louis Meckel, MD (Inactive) as Attending Physician (Gastroenterology) Nyoka Cowden, MD as Consulting Physician (Pulmonary Disease)  Indicate any recent Medical Services you may have received from other than [facility] providers in the past year (date may be approximate).     Assessment:   This is a routine wellness examination for Omar [name].  Hearing/Vision screen Hearing Screening - Comments:: Denies hearing issues Vision Screening - Comments:: No regular eye exams   Goals Addressed             This Visit's Progress    Patient Stated       12/22/2023, denies goals       Depression Screen    12/22/2023   10:24 AM 09/05/2023    3:11 PM 06/27/2023   10:28 AM 03/06/2023    2:48 PM 06/09/2022   11:07 AM 04/18/2022    1:57 PM 11/08/2021   11:30 AM  PHQ 2/9 Scores  PHQ - 2 Score 0 0 0 4 0 3 0  PHQ- 9 Score    6  9     Fall Risk    12/22/2023   10:24 AM 09/05/2023    3:11 PM 06/27/2023   10:28 AM 03/06/2023    2:48 PM 06/09/2022   11:07 AM  Fall Risk   Falls in the past year? 0 0 0 0 0  Number falls in past yr: 0 0 0 0   Injury with Fall?  0 0 0 0   Risk for fall due to : Medication side effect No Fall Risks No Fall Risks No Fall Risks   Follow up Falls prevention discussed;Falls evaluation completed Falls evaluation completed Falls evaluation completed Falls evaluation completed     MEDICARE RISK AT HOME: Medicare Risk at Home Any  stairs in or around the home?: No If so, are there any without handrails?: No Home free of loose throw rugs in walkways, pet beds, electrical cords, etc?: Yes Adequate lighting in your home to reduce risk of falls?: Yes Life alert?: No Use of a cane, walker or w/c?: No Grab bars in the bathroom?: No Shower chair or bench in shower?: No Elevated toilet seat or a handicapped toilet?: No  TIMED UP AND GO:  Was the test performed?  No    Cognitive Function:  6 CIT not administered. Patient has a diagnosis of dementia. Followed by neurology.       01/13/2022   10:53 AM 01/13/2021   10:59 AM 09/08/2020   10:51 AM  Montreal Cognitive Assessment   Visuospatial/ Executive (0/5) 0 1 1  Naming (0/3) 3 3 3   Attention: Read list of digits (0/2) 1 2 1   Attention: Read list of letters (0/1) 0 1 1  Attention: Serial 7 subtraction starting at 100 (0/3) 1 1 1   Language: Repeat phrase (0/2) 1 0 1  Language : Fluency (0/1) 1 0 0  Abstraction (0/2) 1 1 1   Delayed Recall (0/5) 0 0 0  Orientation (0/6) 5 6 6   Total 13 15 15   Adjusted Score (based on education)  16 16      05/26/2020    2:01 PM  6CIT Screen  What Year? 0 points  What month? 0 points  What time? 0 points  Count back from 20 0 points  Months in reverse 4 points  Repeat phrase 0 points  Total Score 4 points    Immunizations Immunization History  Administered Date(s) Administered   Fluad Quad(high Dose 65+) 11/12/2022   Fluad Trivalent(High Dose 65+) 09/05/2023   Influenza Split 11/09/2012, 09/04/2013   Influenza Whole 09/04/2016   Influenza, High Dose Seasonal PF 08/23/2017, 01/09/2019, 08/01/2019   Influenza,inj,Quad PF,6+ Mos 08/28/2014, 08/26/2015   Influenza-Unspecified 09/17/2021   Moderna Covid-19 Vaccine Bivalent Booster 37yrs & up 09/08/2021, 11/12/2022   PFIZER(Purple Top)SARS-COV-2 Vaccination 01/13/2020, 02/05/2020   Pneumococcal Conjugate-13 07/17/2014, 08/01/2019   Pneumococcal Polysaccharide-23  02/29/2012, 11/09/2012   Td 05/27/2009    TDAP status: Due, Education has been provided regarding the importance of this vaccine. Advised may receive this vaccine at local pharmacy or Health Dept. Aware to provide a copy of the vaccination record if obtained from local pharmacy or Health Dept. Verbalized acceptance and understanding.  Flu Vaccine status: Up to date  Pneumococcal vaccine status: Up to date  Covid-19 vaccine status: Information provided on how to obtain vaccines.   Qualifies for Shingles Vaccine? Yes   Zostavax completed No   Shingrix Completed?: No.    Education has been provided regarding the importance of this vaccine. Patient has been advised to call insurance company to determine out of pocket expense if they have not yet received this vaccine. Advised may also receive vaccine at local pharmacy or Health Dept. Verbalized acceptance and understanding.  Screening Tests Health Maintenance  Topic Date Due   Zoster Vaccines- Shingrix (1 of 2) Never done   COVID-19 Vaccine (5 - 2024-25 season) 08/06/2023   Colonoscopy  10/26/2023   Medicare Annual  Wellness (AWV)  12/21/2024   Pneumonia Vaccine 45+ Years old  Completed   INFLUENZA VACCINE  Completed   Hepatitis C Screening  Completed   HPV VACCINES  Aged Out   DTaP/Tdap/Td  Discontinued    Health Maintenance  Health Maintenance Due  Topic Date Due   Zoster Vaccines- Shingrix (1 of 2) Never done   COVID-19 Vaccine (5 - 2024-25 season) 08/06/2023   Colonoscopy  10/26/2023    Colorectal cancer screening: Type of screening: Colonoscopy. Completed 10/25/2018. Repeat every 5 years  Lung Cancer Screening: (Low Dose CT Chest recommended if Age 67-80 years, 20 pack-year currently smoking OR have quit w/in 15years.) does not qualify.   Lung Cancer Screening Referral: no  Additional Screening:  Hepatitis C Screening: does qualify; Completed 09/13/2016  Vision Screening: Recommended annual ophthalmology exams for  early detection of glaucoma and other disorders of the eye. Is the patient up to date with their annual eye exam?  No  Who is the provider or what is the name of the office in which the patient attends annual eye exams? none If pt is not established with a provider, would they like to be referred to a provider to establish care? No .   Dental Screening: Recommended annual dental exams for proper oral hygiene  Diabetic Foot Exam: n/a  Community Resource Referral / Chronic Care Management: CRR required this visit?  No   CCM required this visit?  No     Plan:     I have personally reviewed and noted the following in the patient's chart:   Medical and social history Use of alcohol, tobacco or illicit drugs  Current medications and supplements including opioid prescriptions. Patient is not currently taking opioid prescriptions. Functional ability and status Nutritional status Physical activity Advanced directives List of other physicians Hospitalizations, surgeries, and ER visits in previous 12 months Vitals Screenings to include cognitive, depression, and falls Referrals and appointments  In addition, I have reviewed and discussed with patient certain preventive protocols, quality metrics, and best practice recommendations. A written personalized care plan for preventive services as well as general preventive health recommendations were provided to patient.     Barb Merino, LPN   [id number]   After Visit Summary: (Pick Up) Due to this being a telephonic visit, with patients personalized plan was offered to patient and patient has requested to Pick up at office.  Nurse Notes: none

## 2023-12-22 NOTE — Patient Instructions (Signed)
Mr. Virani , Thank you for taking time to come for your Medicare Wellness Visit. I appreciate your ongoing commitment to your health goals. Please review the following plan we discussed and let me know if I can assist you in the future.   Referrals/Orders/Follow-Ups/Clinician Recommendations: none  This is a list of the screening recommended for you and due dates:  Health Maintenance  Topic Date Due   Zoster (Shingles) Vaccine (1 of 2) Never done   COVID-19 Vaccine (5 - 2024-25 season) 08/06/2023   Colon Cancer Screening  10/26/2023   Medicare Annual Wellness Visit  12/21/2024   Pneumonia Vaccine  Completed   Flu Shot  Completed   Hepatitis C Screening  Completed   HPV Vaccine  Aged Out   DTaP/Tdap/Td vaccine  Discontinued    Advanced directives: (ACP Link)Information on Advanced Care Planning can be found at [facility] of [place] Advance Health Care Directives Advance Health Care Directives ([email or web address]   Next Medicare Annual Wellness Visit scheduled for next year: Yes  insert Preventive Care attachment Insert FALL PREVENTION attachment if needed

## 2023-12-23 ENCOUNTER — Other Ambulatory Visit: Payer: Self-pay | Admitting: Emergency Medicine

## 2023-12-23 DIAGNOSIS — I10 Essential (primary) hypertension: Secondary | ICD-10-CM

## 2024-02-29 ENCOUNTER — Ambulatory Visit: Payer: Medicare HMO | Admitting: Physician Assistant

## 2024-03-05 ENCOUNTER — Encounter: Payer: Self-pay | Admitting: Emergency Medicine

## 2024-03-05 ENCOUNTER — Ambulatory Visit (INDEPENDENT_AMBULATORY_CARE_PROVIDER_SITE_OTHER): Payer: Medicare Other | Admitting: Emergency Medicine

## 2024-03-05 VITALS — BP 158/96 | HR 58 | Temp 98.1°F | Ht 75.0 in | Wt 170.0 lb

## 2024-03-05 DIAGNOSIS — K219 Gastro-esophageal reflux disease without esophagitis: Secondary | ICD-10-CM | POA: Diagnosis not present

## 2024-03-05 DIAGNOSIS — I10 Essential (primary) hypertension: Secondary | ICD-10-CM | POA: Diagnosis not present

## 2024-03-05 DIAGNOSIS — E785 Hyperlipidemia, unspecified: Secondary | ICD-10-CM

## 2024-03-05 DIAGNOSIS — L84 Corns and callosities: Secondary | ICD-10-CM | POA: Diagnosis not present

## 2024-03-05 DIAGNOSIS — F0390 Unspecified dementia without behavioral disturbance: Secondary | ICD-10-CM | POA: Diagnosis not present

## 2024-03-05 DIAGNOSIS — N1831 Chronic kidney disease, stage 3a: Secondary | ICD-10-CM | POA: Diagnosis not present

## 2024-03-05 MED ORDER — LOSARTAN POTASSIUM-HCTZ 100-12.5 MG PO TABS: 1.0000 | ORAL_TABLET | Freq: Every day | ORAL | 3 refills | Status: DC

## 2024-03-05 NOTE — Patient Instructions (Signed)
 Hypertension, Adult High blood pressure (hypertension) is when the force of blood pumping through the arteries is too strong. The arteries are the blood vessels that carry blood from the heart throughout the body. Hypertension forces the heart to work harder to pump blood and may cause arteries to become narrow or stiff. Untreated or uncontrolled hypertension can lead to a heart attack, heart failure, a stroke, kidney disease, and other problems. A blood pressure reading consists of a higher number over a lower number. Ideally, your blood pressure should be below 120/80. The first ("top") number is called the systolic pressure. It is a measure of the pressure in your arteries as your heart beats. The second ("bottom") number is called the diastolic pressure. It is a measure of the pressure in your arteries as the heart relaxes. What are the causes? The exact cause of this condition is not known. There are some conditions that result in high blood pressure. What increases the risk? Certain factors may make you more likely to develop high blood pressure. Some of these risk factors are under your control, including: Smoking. Not getting enough exercise or physical activity. Being overweight. Having too much fat, sugar, calories, or salt (sodium) in your diet. Drinking too much alcohol. Other risk factors include: Having a personal history of heart disease, diabetes, high cholesterol, or kidney disease. Stress. Having a family history of high blood pressure and high cholesterol. Having obstructive sleep apnea. Age. The risk increases with age. What are the signs or symptoms? High blood pressure may not cause symptoms. Very high blood pressure (hypertensive crisis) may cause: Headache. Fast or irregular heartbeats (palpitations). Shortness of breath. Nosebleed. Nausea and vomiting. Vision changes. Severe chest pain, dizziness, and seizures. How is this diagnosed? This condition is diagnosed by  measuring your blood pressure while you are seated, with your arm resting on a flat surface, your legs uncrossed, and your feet flat on the floor. The cuff of the blood pressure monitor will be placed directly against the skin of your upper arm at the level of your heart. Blood pressure should be measured at least twice using the same arm. Certain conditions can cause a difference in blood pressure between your right and left arms. If you have a high blood pressure reading during one visit or you have normal blood pressure with other risk factors, you may be asked to: Return on a different day to have your blood pressure checked again. Monitor your blood pressure at home for 1 week or longer. If you are diagnosed with hypertension, you may have other blood or imaging tests to help your health care provider understand your overall risk for other conditions. How is this treated? This condition is treated by making healthy lifestyle changes, such as eating healthy foods, exercising more, and reducing your alcohol intake. You may be referred for counseling on a healthy diet and physical activity. Your health care provider may prescribe medicine if lifestyle changes are not enough to get your blood pressure under control and if: Your systolic blood pressure is above 130. Your diastolic blood pressure is above 80. Your personal target blood pressure may vary depending on your medical conditions, your age, and other factors. Follow these instructions at home: Eating and drinking  Eat a diet that is high in fiber and potassium, and low in sodium, added sugar, and fat. An example of this eating plan is called the DASH diet. DASH stands for Dietary Approaches to Stop Hypertension. To eat this way: Eat  plenty of fresh fruits and vegetables. Try to fill one half of your plate at each meal with fruits and vegetables. Eat whole grains, such as whole-wheat pasta, brown rice, or whole-grain bread. Fill about one  fourth of your plate with whole grains. Eat or drink low-fat dairy products, such as skim milk or low-fat yogurt. Avoid fatty cuts of meat, processed or cured meats, and poultry with skin. Fill about one fourth of your plate with lean proteins, such as fish, chicken without skin, beans, eggs, or tofu. Avoid pre-made and processed foods. These tend to be higher in sodium, added sugar, and fat. Reduce your daily sodium intake. Many people with hypertension should eat less than 1,500 mg of sodium a day. Do not drink alcohol if: Your health care provider tells you not to drink. You are pregnant, may be pregnant, or are planning to become pregnant. If you drink alcohol: Limit how much you have to: 0-1 drink a day for women. 0-2 drinks a day for men. Know how much alcohol is in your drink. In the [place] one drink equals one 12 oz bottle of beer (355 mL), one 5 oz glass of wine (148 mL), or one 1 oz glass of hard liquor (44 mL). Lifestyle  Work with your health care provider to maintain a healthy body weight or to lose weight. Ask what an ideal weight is for you. Get at least 30 minutes of exercise that causes your heart to beat faster (aerobic exercise) most days of the week. Activities may include walking, swimming, or biking. Include exercise to strengthen your muscles (resistance exercise), such as Pilates or lifting weights, as part of your weekly exercise routine. Try to do these types of exercises for 30 minutes at least 3 days a week. Do not use any products that contain nicotine or tobacco. These products include cigarettes, chewing tobacco, and vaping devices, such as e-cigarettes. If you need help quitting, ask your health care provider. Monitor your blood pressure at home as told by your health care provider. Keep all follow-up visits. This is important. Medicines Take over-the-counter and prescription medicines only as told by your health care provider. Follow directions carefully. Blood  pressure medicines must be taken as prescribed. Do not skip doses of blood pressure medicine. Doing this puts you at risk for problems and can make the medicine less effective. Ask your health care provider about side effects or reactions to medicines that you should watch for. Contact a health care provider if you: Think you are having a reaction to a medicine you are taking. Have headaches that keep coming back (recurring). Feel dizzy. Have swelling in your ankles. Have trouble with your vision. Get help right away if you: Develop a severe headache or confusion. Have unusual weakness or numbness. Feel faint. Have severe pain in your chest or abdomen. Vomit repeatedly. Have trouble breathing. These symptoms may be an emergency. Get help right away. Call 911. Do not wait to see if the symptoms will go away. Do not drive yourself to the hospital. Summary Hypertension is when the force of blood pumping through your arteries is too strong. If this condition is not controlled, it may put you at risk for serious complications. Your personal target blood pressure may vary depending on your medical conditions, your age, and other factors. For most people, a normal blood pressure is less than 120/80. Hypertension is treated with lifestyle changes, medicines, or a combination of both. Lifestyle changes include losing weight, eating a healthy,  low-sodium diet, exercising more, and limiting alcohol. This information is not intended to replace advice given to you by your health care provider. Make sure you discuss any questions you have with your health care provider. Document Revised: 09/28/2021 Document Reviewed: 09/28/2021 Elsevier Patient Education  2024 [employer].

## 2024-03-05 NOTE — Assessment & Plan Note (Signed)
 Clinically stable and asymptomatic Continues Nexium as needed

## 2024-03-05 NOTE — Assessment & Plan Note (Signed)
Chronic stable condition Continue atorvastatin 40 mg daily 

## 2024-03-05 NOTE — Assessment & Plan Note (Signed)
 Uncontrolled hypertension Cardiovascular risks associated with hypertension discussed Recommend to increase losartan to losartan/HCTZ 100-12.5 mg daily Diet and nutrition discussed

## 2024-03-05 NOTE — Assessment & Plan Note (Signed)
Stable.  Continues Aricept 10 mg daily. 

## 2024-03-05 NOTE — Progress Notes (Signed)
 Omar [name]. 78 y.o.   Chief Complaint  Patient presents with   Follow-up    6 month f/u for HTN. Patient mentions having a corn on his right second toe. Has been there for a awhile and sometime hurts     HISTORY OF PRESENT ILLNESS: This is a 78 y.o. male here for 63-month follow-up of chronic medical conditions including hypertension Accompanied by wife. Also complaining of corn on toe left foot No other complaints or medical concerns today. BP Readings from Last 3 Encounters:  09/27/23 (!) 151/94  09/05/23 128/78  06/27/23 136/70     HPI   Prior to Admission medications   Medication Sig Start Date End Date Taking? Authorizing Provider  atorvastatin (LIPITOR) 40 MG tablet TAKE 1 TABLET BY MOUTH EVERY DAY 03/31/23   Georgina Quint, MD  donepezil (ARICEPT) 10 MG tablet TAKE 1 TABLET BY MOUTH EVERYDAY AT BEDTIME 07/20/23   Sater, Pearletha Furl, MD  dronabinol (MARINOL) 10 MG capsule TAKE 1 CAPSULE BY MOUTH TWICE DAILY BEFORE A MEAL Patient not taking: Reported on 12/22/2023 10/03/22   Doreatha Massed, MD  losartan (COZAAR) 50 MG tablet TAKE 1 TABLET BY MOUTH EVERY DAY 12/24/23   Georgina Quint, MD  OVER THE COUNTER MEDICATION daily. Patient not taking: Reported on 12/22/2023    [provider]  psyllium (METAMUCIL) 58.6 % powder Take 1 packet by mouth daily.    Georgina Quint, MD  vardenafil (LEVITRA) 20 MG tablet Take 20 mg by mouth as needed.    02/29/12  [provider]    Allergies  Allergen Reactions   Sulfonamide Derivatives Other (See Comments)     blisters on skin    Patient Active Problem List   Diagnosis Date Noted   Stage 3a chronic kidney disease (HCC) 06/27/2023   Essential hypertension 11/01/2021   History of colon cancer 06/17/2021   Lipoma of anterior chest wall 04/29/2021   Snoring 01/13/2021   Dementia without behavioral disturbance (HCC) 01/13/2021   Excessive daytime sleepiness 01/13/2021   Memory loss  09/08/2020   Gait disturbance 09/08/2020   Diverticulosis 04/04/2019   Internal hemorrhoids 04/04/2019   Loss of weight 01/01/2019   S/P right colectomy 08/31/2017   Polyneuropathy 11/21/2016   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   History of pulmonary embolism 07/18/2014   Adrenal adenoma 03/18/2014   Coronary atherosclerosis of native coronary artery 03/18/2014   Hyperlipidemia 11/13/2013   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/2013   Prostate cancer (HCC) 11/09/2012   Stricture and stenosis of esophagus 05/31/2012   Esophageal reflux 10/09/2007   Dyslipidemia 07/16/2007   Depression 07/16/2007   History of colonic polyps 07/16/2007    Past Medical History:  Diagnosis Date   Anxiety    BPH (benign prostatic hypertrophy)    Decreased white blood cell count    Depression    Diverticulosis    Erectile dysfunction    Gastric polyps    GERD (gastroesophageal reflux disease)    History of colonic polyps    History of esophageal stricture 10/31/2007   History of kidney stones    History of prostate surgery    History of rheumatic fever    Hyperlipidemia    Hypertension    Prostate cancer (HCC) 11/09/2012   Prostatism     Past Surgical History:  Procedure Laterality Date   CARPAL TUNNEL RELEASE     Bilateral   ESOPHAGEAL DILATION     last dilation 8'12  INGUINAL HERNIA REPAIR     INSERTION OF MESH N/A 04/15/2013   Procedure: INSERTION OF MESH;  Surgeon: Adolph Pollack, MD;  Location: [facility] OR;  Service: General;  Laterality: N/A;   KNEE ARTHROSCOPY     left   LAPAROSCOPIC RIGHT HEMI COLECTOMY Right 08/31/2017   Procedure: LAPAROSCOPIC ASSISTED  RIGHT HEMI COLECTOMY;  Surgeon: Luretha Murphy, MD;  Location: [facility] ORS;  Service: General;  Laterality: Right;   LEFT HEART CATHETERIZATION WITH CORONARY ANGIOGRAM N/A 07/18/2014   Procedure: LEFT HEART CATHETERIZATION WITH CORONARY ANGIOGRAM;  Surgeon: Kathleene Hazel, MD;  Location: [facility] CATH LAB;   Service: Cardiovascular;  Laterality: N/A;   ROBOT ASSISTED LAPAROSCOPIC RADICAL PROSTATECTOMY  11/08/2012   Procedure: ROBOTIC ASSISTED LAPAROSCOPIC RADICAL PROSTATECTOMY LEVEL 1;  Surgeon: Milford Cage, MD;  Location: [facility] ORS;  Service: Urology;  Laterality: N/A;      TONSILLECTOMY     UMBILICAL HERNIA REPAIR N/A 04/15/2013   Procedure: HERNIA REPAIR UMBILICAL ADULT;  Surgeon: Adolph Pollack, MD;  Location: [facility] OR;  Service: General;  Laterality: N/A;    Social History   Socioeconomic History   Marital status: Married    Spouse name: Not on file   Number of children: 4   Years of education: Not on file   Highest education level: Not on file  Occupational History   Occupation: Education officer, environmental    Employer: [employer]  Tobacco Use   Smoking status: Former    Current packs/day: 0.00    Average packs/day: 1 pack/day for 25.0 years (25.0 ttl pk-yrs)    Types: Cigarettes    Start date: 12/05/1968    Quit date: 12/05/1993    Years since quitting: 30.2   Smokeless tobacco: Never  Vaping Use   Vaping status: Never Used  Substance and Sexual Activity   Alcohol use: No   Drug use: No   Sexual activity: Yes  Other Topics Concern   Not on file  Social History Narrative   Lives with wife in a one story home.  Has 4 children.  Works as a Education officer, environmental.     Education: 10th grade.    Caffeine use: soda (one per day)   Right handed   Social Drivers of Health   Financial Resource Strain: Low Risk  (12/22/2023)   Overall Financial Resource Strain (CARDIA)    Difficulty of Paying Living Expenses: Not hard at all  Food Insecurity: No Food Insecurity (12/22/2023)   Hunger Vital Sign    Worried About Running Out of Food in the Last Year: Never true    Ran Out of Food in the Last Year: Never true  Transportation Needs: No Transportation Needs (12/22/2023)   PRAPARE - Administrator, Civil Service (Medical): No    Lack of Transportation (Non-Medical): No  Physical  Activity: Inactive (12/22/2023)   Exercise Vital Sign    Days of Exercise per Week: 0 days    Minutes of Exercise per Session: 0 min  Stress: No Stress Concern Present (12/22/2023)   [employer] of Occupational Health - Occupational Stress Questionnaire    Feeling of Stress : Not at all  Social Connections: Moderately Isolated (12/22/2023)   Social Connection and Isolation Panel [NHANES]    Frequency of Communication with Friends and Family: Twice a week    Frequency of Social Gatherings with Friends and Family: Never    Attends Religious Services: More than 4 times per year    Active Member of [employer]  or Organizations: No    Attends Banker Meetings: Never    Marital Status: Married  Catering manager Violence: Not At Risk (12/22/2023)   Humiliation, Afraid, Rape, and Kick questionnaire    Fear of Current or Ex-Partner: No    Emotionally Abused: No    Physically Abused: No    Sexually Abused: No    Family History  Problem Relation Age of Onset   Ovarian cancer Mother    Colon cancer Mother 42   High Cholesterol Mother    Stroke Father    Lung cancer Maternal Uncle    Lung cancer Maternal Aunt    Esophageal cancer Neg Hx    Stomach cancer Neg Hx    Rectal cancer Neg Hx    Pancreatic cancer Neg Hx    Liver disease Neg Hx      Review of Systems  Constitutional: Negative.  Negative for chills and fever.  HENT: Negative.  Negative for congestion and sore throat.   Respiratory: Negative.  Negative for cough and shortness of breath.   Cardiovascular: Negative.  Negative for chest pain and palpitations.  Gastrointestinal:  Negative for abdominal pain, diarrhea, nausea and vomiting.  Genitourinary: Negative.  Negative for dysuria and hematuria.  Skin: Negative.  Negative for rash.  Neurological: Negative.  Negative for dizziness and headaches.  All other systems reviewed and are negative.   Today's Vitals   03/05/24 1604  BP: (!) 158/96  Pulse: (!) 58  Temp:  98.1 F (36.7 C)  TempSrc: Oral  SpO2: 98%  Weight: 170 lb (77.1 kg)  Height: 6\' 3"  (1.905 m)   Body mass index is 21.25 kg/m.   Physical Exam Vitals reviewed.  Constitutional:      Appearance: Normal appearance.  HENT:     Head: Normocephalic.     Mouth/Throat:     Mouth: Mucous membranes are moist.     Pharynx: Oropharynx is clear.  Eyes:     Extraocular Movements: Extraocular movements intact.     Conjunctiva/sclera: Conjunctivae normal.     Pupils: Pupils are equal, round, and reactive to light.  Cardiovascular:     Rate and Rhythm: Normal rate and regular rhythm.     Pulses: Normal pulses.     Heart sounds: Normal heart sounds.  Pulmonary:     Effort: Pulmonary effort is normal.     Breath sounds: Normal breath sounds.  Abdominal:     Palpations: Abdomen is soft.     Tenderness: There is no abdominal tenderness.  Musculoskeletal:     Cervical back: No tenderness.  Lymphadenopathy:     Cervical: No cervical adenopathy.  Skin:    General: Skin is warm and dry.     Comments: Left foot: Positive corn second toe  Neurological:     General: No focal deficit present.     Mental Status: He is alert and oriented to person, place, and time.  Psychiatric:        Mood and Affect: Mood normal.        Behavior: Behavior normal.      ASSESSMENT & PLAN: A total of 45 minutes was spent with the patient and counseling/coordination of care regarding preparing for this visit, review of most recent office visit notes, review of multiple chronic medical conditions and their management, cardiovascular risks associated with uncontrolled hypertension, review of all medications and changes made, review of most recent bloodwork results, review of health maintenance items, education on nutrition, prognosis, documentation, and need  for follow up.   Problem List Items Addressed This Visit       Cardiovascular and Mediastinum   Essential hypertension - Primary   Uncontrolled  hypertension Cardiovascular risks associated with hypertension discussed Recommend to increase losartan to losartan/HCTZ 100-12.5 mg daily Diet and nutrition discussed      Relevant Medications   losartan-hydrochlorothiazide (HYZAAR) 100-12.5 MG tablet     Digestive   Esophageal reflux   Clinically stable and asymptomatic Continues Nexium as needed        Nervous and Auditory   Dementia without behavioral disturbance (HCC)   Stable.  Continues Aricept 10 mg daily        Genitourinary   Stage 3a chronic kidney disease (HCC)   Chronic stable condition Advised to stay well-hydrated and avoid NSAIDs        Other   Dyslipidemia   Chronic stable condition Continue atorvastatin 40 mg daily      Other Visit Diagnoses       Corn of toe       Relevant Orders   Ambulatory referral to Podiatry      Patient Instructions  Hypertension, Adult High blood pressure (hypertension) is when the force of blood pumping through the arteries is too strong. The arteries are the blood vessels that carry blood from the heart throughout the body. Hypertension forces the heart to work harder to pump blood and may cause arteries to become narrow or stiff. Untreated or uncontrolled hypertension can lead to a heart attack, heart failure, a stroke, kidney disease, and other problems. A blood pressure reading consists of a higher number over a lower number. Ideally, your blood pressure should be below 120/80. The first ("top") number is called the systolic pressure. It is a measure of the pressure in your arteries as your heart beats. The second ("bottom") number is called the diastolic pressure. It is a measure of the pressure in your arteries as the heart relaxes. What are the causes? The exact cause of this condition is not known. There are some conditions that result in high blood pressure. What increases the risk? Certain factors may make you more likely to develop high blood pressure. Some of  these risk factors are under your control, including: Smoking. Not getting enough exercise or physical activity. Being overweight. Having too much fat, sugar, calories, or salt (sodium) in your diet. Drinking too much alcohol. Other risk factors include: Having a personal history of heart disease, diabetes, high cholesterol, or kidney disease. Stress. Having a family history of high blood pressure and high cholesterol. Having obstructive sleep apnea. Age. The risk increases with age. What are the signs or symptoms? High blood pressure may not cause symptoms. Very high blood pressure (hypertensive crisis) may cause: Headache. Fast or irregular heartbeats (palpitations). Shortness of breath. Nosebleed. Nausea and vomiting. Vision changes. Severe chest pain, dizziness, and seizures. How is this diagnosed? This condition is diagnosed by measuring your blood pressure while you are seated, with your arm resting on a flat surface, your legs uncrossed, and your feet flat on the floor. The cuff of the blood pressure monitor will be placed directly against the skin of your upper arm at the level of your heart. Blood pressure should be measured at least twice using the same arm. Certain conditions can cause a difference in blood pressure between your right and left arms. If you have a high blood pressure reading during one visit or you have normal blood pressure with other risk factors,  you may be asked to: Return on a different day to have your blood pressure checked again. Monitor your blood pressure at home for 1 week or longer. If you are diagnosed with hypertension, you may have other blood or imaging tests to help your health care provider understand your overall risk for other conditions. How is this treated? This condition is treated by making healthy lifestyle changes, such as eating healthy foods, exercising more, and reducing your alcohol intake. You may be referred for counseling on a  healthy diet and physical activity. Your health care provider may prescribe medicine if lifestyle changes are not enough to get your blood pressure under control and if: Your systolic blood pressure is above 130. Your diastolic blood pressure is above 80. Your personal target blood pressure may vary depending on your medical conditions, your age, and other factors. Follow these instructions at home: Eating and drinking  Eat a diet that is high in fiber and potassium, and low in sodium, added sugar, and fat. An example of this eating plan is called the DASH diet. DASH stands for Dietary Approaches to Stop Hypertension. To eat this way: Eat plenty of fresh fruits and vegetables. Try to fill one half of your plate at each meal with fruits and vegetables. Eat whole grains, such as whole-wheat pasta, brown rice, or whole-grain bread. Fill about one fourth of your plate with whole grains. Eat or drink low-fat dairy products, such as skim milk or low-fat yogurt. Avoid fatty cuts of meat, processed or cured meats, and poultry with skin. Fill about one fourth of your plate with lean proteins, such as fish, chicken without skin, beans, eggs, or tofu. Avoid pre-made and processed foods. These tend to be higher in sodium, added sugar, and fat. Reduce your daily sodium intake. Many people with hypertension should eat less than 1,500 mg of sodium a day. Do not drink alcohol if: Your health care provider tells you not to drink. You are pregnant, may be pregnant, or are planning to become pregnant. If you drink alcohol: Limit how much you have to: 0-1 drink a day for women. 0-2 drinks a day for men. Know how much alcohol is in your drink. In the [place] one drink equals one 12 oz bottle of beer (355 mL), one 5 oz glass of wine (148 mL), or one 1 oz glass of hard liquor (44 mL). Lifestyle  Work with your health care provider to maintain a healthy body weight or to lose weight. Ask what an ideal weight is for  you. Get at least 30 minutes of exercise that causes your heart to beat faster (aerobic exercise) most days of the week. Activities may include walking, swimming, or biking. Include exercise to strengthen your muscles (resistance exercise), such as Pilates or lifting weights, as part of your weekly exercise routine. Try to do these types of exercises for 30 minutes at least 3 days a week. Do not use any products that contain nicotine or tobacco. These products include cigarettes, chewing tobacco, and vaping devices, such as e-cigarettes. If you need help quitting, ask your health care provider. Monitor your blood pressure at home as told by your health care provider. Keep all follow-up visits. This is important. Medicines Take over-the-counter and prescription medicines only as told by your health care provider. Follow directions carefully. Blood pressure medicines must be taken as prescribed. Do not skip doses of blood pressure medicine. Doing this puts you at risk for problems and can make the medicine  less effective. Ask your health care provider about side effects or reactions to medicines that you should watch for. Contact a health care provider if you: Think you are having a reaction to a medicine you are taking. Have headaches that keep coming back (recurring). Feel dizzy. Have swelling in your ankles. Have trouble with your vision. Get help right away if you: Develop a severe headache or confusion. Have unusual weakness or numbness. Feel faint. Have severe pain in your chest or abdomen. Vomit repeatedly. Have trouble breathing. These symptoms may be an emergency. Get help right away. Call 911. Do not wait to see if the symptoms will go away. Do not drive yourself to the hospital. Summary Hypertension is when the force of blood pumping through your arteries is too strong. If this condition is not controlled, it may put you at risk for serious complications. Your personal target  blood pressure may vary depending on your medical conditions, your age, and other factors. For most people, a normal blood pressure is less than 120/80. Hypertension is treated with lifestyle changes, medicines, or a combination of both. Lifestyle changes include losing weight, eating a healthy, low-sodium diet, exercising more, and limiting alcohol. This information is not intended to replace advice given to you by your health care provider. Make sure you discuss any questions you have with your health care provider. Document Revised: 09/28/2021 Document Reviewed: 09/28/2021 Elsevier Patient Education  2024 Elsevier Inc.     Edwina Barth, MD [facility]

## 2024-03-05 NOTE — Assessment & Plan Note (Signed)
 Chronic stable condition. Advised to stay well-hydrated and avoid NSAIDs

## 2024-03-11 ENCOUNTER — Ambulatory Visit: Admitting: Podiatry

## 2024-03-11 ENCOUNTER — Encounter: Payer: Self-pay | Admitting: Podiatry

## 2024-03-11 VITALS — Ht 75.0 in | Wt 170.0 lb

## 2024-03-11 DIAGNOSIS — M2041 Other hammer toe(s) (acquired), right foot: Secondary | ICD-10-CM | POA: Diagnosis not present

## 2024-03-11 DIAGNOSIS — L84 Corns and callosities: Secondary | ICD-10-CM | POA: Diagnosis not present

## 2024-03-11 NOTE — Patient Instructions (Signed)
 More silicone pads can be purchased from:  [email or web address].[email or web address]   Altra shoes would be a good option ( I like the [employer] model)

## 2024-03-13 ENCOUNTER — Other Ambulatory Visit: Payer: Self-pay | Admitting: Emergency Medicine

## 2024-03-13 DIAGNOSIS — E785 Hyperlipidemia, unspecified: Secondary | ICD-10-CM

## 2024-03-13 MED ORDER — ATORVASTATIN CALCIUM 40 MG PO TABS: 40.0000 mg | ORAL_TABLET | Freq: Every day | ORAL | 3 refills | Status: DC

## 2024-03-13 NOTE — Progress Notes (Signed)
  Subjective:  Patient ID: Omar Beecham., male    DOB: 1946-06-15,  MRN: [id number]  Chief Complaint  Patient presents with   Callouses    Pt is here due to corn on right second toe states it has been there for a while but recently starting to bother him.    78 y.o. male presents with the above complaint. History confirmed with patient.   Objective:  Physical Exam: warm, good capillary refill, no trophic changes or ulcerative lesions, normal DP and PT pulses, normal sensory exam, and dorsal DIPJ painful corn.  Assessment:   1. Hammertoe of right foot   2. Callus of foot      Plan:  Patient was evaluated and treated and all questions answered.  All symptomatic hyperkeratoses were safely debrided with a sterile #15 blade to patient's level of comfort without incident. We discussed preventative and palliative care of these lesions including supportive and accommodative shoegear, padding, as well as surgical treatment with hammertoe correction  No follow-ups on file.

## 2024-03-13 NOTE — Telephone Encounter (Signed)
 Copied from CRM [id number]. Topic: Clinical - Medication Question >> Mar 13, 2024  1:46 PM Denese Killings wrote: Reason for CRM: Patient wants atorvastatin (LIPITOR) 40 MG tablet prescription sent to [facility]. [facility] [address] [place], [place] [address] Fax: [phone]

## 2024-07-17 DIAGNOSIS — H5203 Hypermetropia, bilateral: Secondary | ICD-10-CM | POA: Diagnosis not present

## 2024-08-23 ENCOUNTER — Telehealth: Payer: Self-pay

## 2024-08-23 NOTE — Telephone Encounter (Signed)
 Spoke with patient, made him aware he no longer needs a script for the vaccine. Gave verbal understanding

## 2024-08-23 NOTE — Telephone Encounter (Signed)
 Copied from CRM [id number]. Topic: Clinical - Medication Question >> Aug 23, 2024  1:44 PM [name] wrote: Reason for CRM:  patient would like a prescription for the  Covid Vaccine sent to [id number] - [place], [place] - [address] AT [facility]

## 2024-09-04 ENCOUNTER — Ambulatory Visit: Admitting: Emergency Medicine

## 2024-09-04 ENCOUNTER — Encounter: Payer: Self-pay | Admitting: Emergency Medicine

## 2024-09-04 VITALS — BP 144/90 | HR 60 | Temp 98.0°F | Ht 75.0 in | Wt 164.2 lb

## 2024-09-04 DIAGNOSIS — I10 Essential (primary) hypertension: Secondary | ICD-10-CM

## 2024-09-04 DIAGNOSIS — F0390 Unspecified dementia without behavioral disturbance: Secondary | ICD-10-CM

## 2024-09-04 DIAGNOSIS — R634 Abnormal weight loss: Secondary | ICD-10-CM | POA: Diagnosis not present

## 2024-09-04 DIAGNOSIS — I2510 Atherosclerotic heart disease of native coronary artery without angina pectoris: Secondary | ICD-10-CM | POA: Diagnosis not present

## 2024-09-04 DIAGNOSIS — C61 Malignant neoplasm of prostate: Secondary | ICD-10-CM

## 2024-09-04 DIAGNOSIS — N1831 Chronic kidney disease, stage 3a: Secondary | ICD-10-CM | POA: Diagnosis not present

## 2024-09-04 DIAGNOSIS — Z8546 Personal history of malignant neoplasm of prostate: Secondary | ICD-10-CM | POA: Diagnosis not present

## 2024-09-04 DIAGNOSIS — Z125 Encounter for screening for malignant neoplasm of prostate: Secondary | ICD-10-CM

## 2024-09-04 DIAGNOSIS — K219 Gastro-esophageal reflux disease without esophagitis: Secondary | ICD-10-CM | POA: Diagnosis not present

## 2024-09-04 DIAGNOSIS — E785 Hyperlipidemia, unspecified: Secondary | ICD-10-CM

## 2024-09-04 MED ORDER — ATORVASTATIN CALCIUM 40 MG PO TABS: 40.0000 mg | ORAL_TABLET | Freq: Every day | ORAL | 3 refills | Status: AC

## 2024-09-04 NOTE — Assessment & Plan Note (Signed)
 BP Readings from Last 3 Encounters:  09/04/24 (!) 144/90  03/05/24 (!) 158/96  09/27/23 (!) 151/94  Elevated blood pressure reading in the office Continues Hyzaar 100-12.5 mg daily Cardiovascular risks associated with hypertension discussed Vies to monitor blood pressure reading at home daily for the next couple weeks and contact the office if numbers persistently abnormal Diet and nutrition discussed

## 2024-09-04 NOTE — Patient Instructions (Signed)
 Health Maintenance After Age 78 After age 27, you are at a higher risk for certain long-term diseases and infections as well as injuries from falls. Falls are a major cause of broken bones and head injuries in people who are older than age 73. Getting regular preventive care can help to keep you healthy and well. Preventive care includes getting regular testing and making lifestyle changes as recommended by your health care provider. Talk with your health care provider about: Which screenings and tests you should have. A screening is a test that checks for a disease when you have no symptoms. A diet and exercise plan that is right for you. What should I know about screenings and tests to prevent falls? Screening and testing are the best ways to find a health problem early. Early diagnosis and treatment give you the best chance of managing medical conditions that are common after age 90. Certain conditions and lifestyle choices may make you more likely to have a fall. Your health care provider may recommend: Regular vision checks. Poor vision and conditions such as cataracts can make you more likely to have a fall. If you wear glasses, make sure to get your prescription updated if your vision changes. Medicine review. Work with your health care provider to regularly review all of the medicines you are taking, including over-the-counter medicines. Ask your health care provider about any side effects that may make you more likely to have a fall. Tell your health care provider if any medicines that you take make you feel dizzy or sleepy. Strength and balance checks. Your health care provider may recommend certain tests to check your strength and balance while standing, walking, or changing positions. Foot health exam. Foot pain and numbness, as well as not wearing proper footwear, can make you more likely to have a fall. Screenings, including: Osteoporosis screening. Osteoporosis is a condition that causes  the bones to get weaker and break more easily. Blood pressure screening. Blood pressure changes and medicines to control blood pressure can make you feel dizzy. Depression screening. You may be more likely to have a fall if you have a fear of falling, feel depressed, or feel unable to do activities that you used to do. Alcohol  use screening. Using too much alcohol  can affect your balance and may make you more likely to have a fall. Follow these instructions at home: Lifestyle Do not drink alcohol  if: Your health care provider tells you not to drink. If you drink alcohol : Limit how much you have to: 0-1 drink a day for women. 0-2 drinks a day for men. Know how much alcohol  is in your drink. In the [place] one drink equals one 12 oz bottle of beer (355 mL), one 5 oz glass of wine (148 mL), or one 1 oz glass of hard liquor (44 mL). Do not use any products that contain nicotine or tobacco. These products include cigarettes, chewing tobacco, and vaping devices, such as e-cigarettes. If you need help quitting, ask your health care provider. Activity  Follow a regular exercise program to stay fit. This will help you maintain your balance. Ask your health care provider what types of exercise are appropriate for you. If you need a cane or walker, use it as recommended by your health care provider. Wear supportive shoes that have nonskid soles. Safety  Remove any tripping hazards, such as rugs, cords, and clutter. Install safety equipment such as grab bars in bathrooms and safety rails on stairs. Keep rooms and walkways  well-lit. General instructions Talk with your health care provider about your risks for falling. Tell your health care provider if: You fall. Be sure to tell your health care provider about all falls, even ones that seem minor. You feel dizzy, tiredness (fatigue), or off-balance. Take over-the-counter and prescription medicines only as told by your health care provider. These include  supplements. Eat a healthy diet and maintain a healthy weight. A healthy diet includes low-fat dairy products, low-fat (lean) meats, and fiber from whole grains, beans, and lots of fruits and vegetables. Stay current with your vaccines. Schedule regular health, dental, and eye exams. Summary Having a healthy lifestyle and getting preventive care can help to protect your health and wellness after age 15. Screening and testing are the best way to find a health problem early and help you avoid having a fall. Early diagnosis and treatment give you the best chance for managing medical conditions that are more common for people who are older than age 42. Falls are a major cause of broken bones and head injuries in people who are older than age 64. Take precautions to prevent a fall at home. Work with your health care provider to learn what changes you can make to improve your health and wellness and to prevent falls. This information is not intended to replace advice given to you by your health care provider. Make sure you discuss any questions you have with your health care provider. Document Revised: 04/12/2021 Document Reviewed: 04/12/2021 Elsevier Patient Education  2024 [employer].

## 2024-09-04 NOTE — Assessment & Plan Note (Signed)
 Chronic stable condition. Advised to stay well-hydrated and avoid NSAIDs

## 2024-09-04 NOTE — Assessment & Plan Note (Signed)
Stable.  Continues Aricept 10 mg daily. 

## 2024-09-04 NOTE — Assessment & Plan Note (Signed)
 Prostatectomy in 2013 Asymptomatic

## 2024-09-04 NOTE — Assessment & Plan Note (Signed)
 Wt Readings from Last 3 Encounters:  09/04/24 164 lb 4 oz (74.5 kg)  03/11/24 170 lb (77.1 kg)  03/05/24 170 lb (77.1 kg)  Stable.  Good appetite. Continue Marinol  10 mg twice a day

## 2024-09-04 NOTE — Assessment & Plan Note (Signed)
 Chronic stable condition Continue atorvastatin  40 mg daily Lipid profile done today

## 2024-09-04 NOTE — Assessment & Plan Note (Signed)
Stable chronic condition.  No recent anginal episodes.

## 2024-09-04 NOTE — Progress Notes (Signed)
 Omar [name]. 78 y.o.   No chief complaint on file.   HISTORY OF PRESENT ILLNESS: This is a 78 y.o. male here for follow-up of multiple chronic medical conditions including hypertension Accompanied by wife Oral doing well. No complaints or medical concerns today.  HPI   Prior to Admission medications   Medication Sig Start Date End Date Taking? Authorizing Provider  donepezil  (ARICEPT ) 10 MG tablet TAKE 1 TABLET BY MOUTH EVERYDAY AT BEDTIME 07/20/23  Yes Sater, Charlie LABOR, MD  losartan -hydrochlorothiazide (HYZAAR) 100-12.5 MG tablet Take 1 tablet by mouth daily. 03/05/24  Yes Brytani Voth Jose, MD  psyllium (METAMUCIL) 58.6 % powder Take 1 packet by mouth daily.   Yes Yvonnie Schinke, Emil Schanz, MD  atorvastatin  (LIPITOR) 40 MG tablet Take 1 tablet (40 mg total) by mouth daily. 09/04/24   Purcell Emil Schanz, MD  dronabinol  (MARINOL ) 10 MG capsule TAKE 1 CAPSULE BY MOUTH TWICE DAILY BEFORE A MEAL Patient not taking: Reported on 09/04/2024 10/03/22   Rogers Hai, MD  OVER THE COUNTER MEDICATION daily. Patient not taking: Reported on 09/04/2024    [provider]  vardenafil (LEVITRA) 20 MG tablet Take 20 mg by mouth as needed.    02/29/12  [provider]    Allergies  Allergen Reactions   Sulfonamide Derivatives Other (See Comments)     blisters on skin    Patient Active Problem List   Diagnosis Date Noted   Stage 3a chronic kidney disease (HCC) 06/27/2023   Essential hypertension 11/01/2021   History of colon cancer 06/17/2021   Lipoma of anterior chest wall 04/29/2021   Snoring 01/13/2021   Dementia without behavioral disturbance (HCC) 01/13/2021   Excessive daytime sleepiness 01/13/2021   Memory loss 09/08/2020   Gait disturbance 09/08/2020   Diverticulosis 04/04/2019   Internal hemorrhoids 04/04/2019   Loss of weight 01/01/2019   S/P right colectomy 08/31/2017   Polyneuropathy 11/21/2016   Hereditary and idiopathic peripheral neuropathy  09/06/2016   History of pulmonary embolism 07/18/2014   Adrenal adenoma 03/18/2014   Coronary atherosclerosis of native coronary artery 03/18/2014   Hyperlipidemia 11/13/2013   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/2013   Prostate cancer (HCC) 11/09/2012   Stricture and stenosis of esophagus 05/31/2012   Esophageal reflux 10/09/2007   Dyslipidemia 07/16/2007   Depression 07/16/2007   History of colonic polyps 07/16/2007    Past Medical History:  Diagnosis Date   Anxiety    BPH (benign prostatic hypertrophy)    Decreased white blood cell count    Depression    Diverticulosis    Erectile dysfunction    Gastric polyps    GERD (gastroesophageal reflux disease)    History of colonic polyps    History of esophageal stricture 10/31/2007   History of kidney stones    History of prostate surgery    History of rheumatic fever    Hyperlipidemia    Hypertension    Prostate cancer (HCC) 11/09/2012   Prostatism     Past Surgical History:  Procedure Laterality Date   CARPAL TUNNEL RELEASE     Bilateral   ESOPHAGEAL DILATION     last dilation 8'12   INGUINAL HERNIA REPAIR     INSERTION OF MESH N/A 04/15/2013   Procedure: INSERTION OF MESH;  Surgeon: Krystal JINNY Russell, MD;  Location: [facility] OR;  Service: General;  Laterality: N/A;   KNEE ARTHROSCOPY     left   LAPAROSCOPIC RIGHT HEMI COLECTOMY Right 08/31/2017   Procedure: LAPAROSCOPIC ASSISTED  RIGHT HEMI COLECTOMY;  Surgeon: Gladis Cough, MD;  Location: [facility] ORS;  Service: General;  Laterality: Right;   LEFT HEART CATHETERIZATION WITH CORONARY ANGIOGRAM N/A 07/18/2014   Procedure: LEFT HEART CATHETERIZATION WITH CORONARY ANGIOGRAM;  Surgeon: Lonni JONETTA Cash, MD;  Location: [facility] CATH LAB;  Service: Cardiovascular;  Laterality: N/A;   ROBOT ASSISTED LAPAROSCOPIC RADICAL PROSTATECTOMY  11/08/2012   Procedure: ROBOTIC ASSISTED LAPAROSCOPIC RADICAL PROSTATECTOMY LEVEL 1;  Surgeon: Toribio Neysa Repine, MD;  Location: [facility] ORS;  Service:  Urology;  Laterality: N/A;      TONSILLECTOMY     UMBILICAL HERNIA REPAIR N/A 04/15/2013   Procedure: HERNIA REPAIR UMBILICAL ADULT;  Surgeon: Krystal JINNY Russell, MD;  Location: [facility] OR;  Service: General;  Laterality: N/A;    Social History   Socioeconomic History   Marital status: Married    Spouse name: Not on file   Number of children: 4   Years of education: Not on file   Highest education level: Not on file  Occupational History   Occupation: Education officer, environmental    Employer: [employer]  Tobacco Use   Smoking status: Former    Current packs/day: 0.00    Average packs/day: 1 pack/day for 25.0 years (25.0 ttl pk-yrs)    Types: Cigarettes    Start date: 12/05/1968    Quit date: 12/05/1993    Years since quitting: 30.7   Smokeless tobacco: Never  Vaping Use   Vaping status: Never Used  Substance and Sexual Activity   Alcohol use: No   Drug use: No   Sexual activity: Yes  Other Topics Concern   Not on file  Social History Narrative   Lives with wife in a one story home.  Has 4 children.  Works as a Education officer, environmental.     Education: 10th grade.    Caffeine use: soda (one per day)   Right handed   Social Drivers of Health   Financial Resource Strain: Low Risk  (12/22/2023)   Overall Financial Resource Strain (CARDIA)    Difficulty of Paying Living Expenses: Not hard at all  Food Insecurity: No Food Insecurity (12/22/2023)   Hunger Vital Sign    Worried About Running Out of Food in the Last Year: Never true    Ran Out of Food in the Last Year: Never true  Transportation Needs: No Transportation Needs (12/22/2023)   PRAPARE - Administrator, Civil Service (Medical): No    Lack of Transportation (Non-Medical): No  Physical Activity: Inactive (12/22/2023)   Exercise Vital Sign    Days of Exercise per Week: 0 days    Minutes of Exercise per Session: 0 min  Stress: No Stress Concern Present (12/22/2023)   [employer] of Occupational Health - Occupational Stress  Questionnaire    Feeling of Stress : Not at all  Social Connections: Moderately Isolated (12/22/2023)   Social Connection and Isolation Panel    Frequency of Communication with Friends and Family: Twice a week    Frequency of Social Gatherings with Friends and Family: Never    Attends Religious Services: More than 4 times per year    Active Member of [employer] or Organizations: No    Attends Banker Meetings: Never    Marital Status: Married  Catering manager Violence: Not At Risk (12/22/2023)   Humiliation, Afraid, Rape, and Kick questionnaire    Fear of Current or Ex-Partner: No    Emotionally Abused: No    Physically Abused: No  Sexually Abused: No    Family History  Problem Relation Age of Onset   Ovarian cancer Mother    Colon cancer Mother 66   High Cholesterol Mother    Stroke Father    Lung cancer Maternal Uncle    Lung cancer Maternal Aunt    Esophageal cancer Neg Hx    Stomach cancer Neg Hx    Rectal cancer Neg Hx    Pancreatic cancer Neg Hx    Liver disease Neg Hx      Review of Systems  Constitutional:  Positive for weight loss. Negative for chills and fever.  HENT: Negative.  Negative for congestion and sore throat.   Respiratory: Negative.  Negative for cough and shortness of breath.   Cardiovascular: Negative.  Negative for chest pain and palpitations.  Gastrointestinal:  Negative for abdominal pain, diarrhea, nausea and vomiting.  Genitourinary: Negative.  Negative for dysuria and hematuria.  Skin: Negative.  Negative for rash.  Neurological: Negative.  Negative for dizziness and headaches.  All other systems reviewed and are negative.   Vitals:   09/04/24 1555  BP: (!) 144/90  Pulse: 60  Temp: 98 F (36.7 C)  SpO2: 99%    Physical Exam Vitals reviewed.  Constitutional:      Appearance: Normal appearance.  HENT:     Head: Normocephalic.     Mouth/Throat:     Mouth: Mucous membranes are moist.     Pharynx: Oropharynx is clear.   Eyes:     Extraocular Movements: Extraocular movements intact.     Pupils: Pupils are equal, round, and reactive to light.  Cardiovascular:     Rate and Rhythm: Normal rate and regular rhythm.     Pulses: Normal pulses.     Heart sounds: Normal heart sounds.  Pulmonary:     Effort: Pulmonary effort is normal.     Breath sounds: Normal breath sounds.  Abdominal:     Palpations: Abdomen is soft.     Tenderness: There is no abdominal tenderness.  Musculoskeletal:     Cervical back: No tenderness.     Right lower leg: No edema.     Left lower leg: No edema.  Lymphadenopathy:     Cervical: No cervical adenopathy.  Skin:    General: Skin is warm and dry.     Capillary Refill: Capillary refill takes less than 2 seconds.  Neurological:     Mental Status: He is alert and oriented to person, place, and time.  Psychiatric:        Mood and Affect: Mood normal.        Behavior: Behavior normal.      ASSESSMENT & PLAN: A total of 42 minutes was spent with the patient and counseling/coordination of care regarding preparing for this visit, review of most recent office visit notes, review of multiple chronic medical conditions and their management, review of all medications, review of most recent bloodwork results, review of health maintenance items, education on nutrition, prognosis, documentation, and need for follow up.   Problem List Items Addressed This Visit       Cardiovascular and Mediastinum   Coronary atherosclerosis of native coronary artery   Stable chronic condition.  No recent anginal episodes.      Relevant Medications   atorvastatin  (LIPITOR) 40 MG tablet   Essential hypertension - Primary   BP Readings from Last 3 Encounters:  09/04/24 (!) 144/90  03/05/24 (!) 158/96  09/27/23 (!) 151/94  Elevated blood pressure reading in  the office Continues Hyzaar 100-12.5 mg daily Cardiovascular risks associated with hypertension discussed Vies to monitor blood pressure  reading at home daily for the next couple weeks and contact the office if numbers persistently abnormal Diet and nutrition discussed       Relevant Medications   atorvastatin  (LIPITOR) 40 MG tablet   Other Relevant Orders   Lipid panel   Comprehensive metabolic panel with GFR   CBC with Differential/Platelet     Digestive   Esophageal reflux   Clinically stable and asymptomatic Continues Nexium  as needed      Relevant Orders   CBC with Differential/Platelet     Nervous and Auditory   Dementia without behavioral disturbance (HCC)   Stable. Continues Aricept  10 mg daily         Genitourinary   Prostate cancer (HCC)   Prostatectomy in 2013 Asymptomatic      Stage 3a chronic kidney disease (HCC)   Chronic stable condition Advised to stay well-hydrated and avoid NSAIDs        Other   Dyslipidemia   Chronic stable condition Continue atorvastatin  40 mg daily Lipid profile done today      Relevant Medications   atorvastatin  (LIPITOR) 40 MG tablet   Other Relevant Orders   Hemoglobin A1c   Loss of weight   Wt Readings from Last 3 Encounters:  09/04/24 164 lb 4 oz (74.5 kg)  03/11/24 170 lb (77.1 kg)  03/05/24 170 lb (77.1 kg)  Stable.  Good appetite. Continue Marinol  10 mg twice a day       Other Visit Diagnoses       Screening for prostate cancer       Relevant Orders   PSA     Malignant neoplasm of prostate [facility])          Patient Instructions  Health Maintenance After Age 28 After age 43, you are at a higher risk for certain [place]-term diseases and infections as well as injuries from falls. Falls are a major cause of broken bones and head injuries in people who are older than age 53. Getting regular preventive care can help to keep you healthy and well. Preventive care includes getting regular testing and making lifestyle changes as recommended by your health care provider. Talk with your health care provider about: Which screenings and tests you  should have. A screening is a test that checks for a disease when you have no symptoms. A diet and exercise plan that is right for you. What should I know about screenings and tests to prevent falls? Screening and testing are the best ways to find a health problem early. Early diagnosis and treatment give you the best chance of managing medical conditions that are common after age 13. Certain conditions and lifestyle choices may make you more likely to have a fall. Your health care provider may recommend: Regular vision checks. Poor vision and conditions such as cataracts can make you more likely to have a fall. If you wear glasses, make sure to get your prescription updated if your vision changes. Medicine review. Work with your health care provider to regularly review all of the medicines you are taking, including over-the-counter medicines. Ask your health care provider about any side effects that may make you more likely to have a fall. Tell your health care provider if any medicines that you take make you feel dizzy or sleepy. Strength and balance checks. Your health care provider may recommend certain tests to check your strength  and balance while standing, walking, or changing positions. Foot health exam. Foot pain and numbness, as well as not wearing proper footwear, can make you more likely to have a fall. Screenings, including: Osteoporosis screening. Osteoporosis is a condition that causes the bones to get weaker and break more easily. Blood pressure screening. Blood pressure changes and medicines to control blood pressure can make you feel dizzy. Depression screening. You may be more likely to have a fall if you have a fear of falling, feel depressed, or feel unable to do activities that you used to do. Alcohol use screening. Using too much alcohol can affect your balance and may make you more likely to have a fall. Follow these instructions at home: Lifestyle Do not drink alcohol  if: Your health care provider tells you not to drink. If you drink alcohol: Limit how much you have to: 0-1 drink a day for women. 0-2 drinks a day for men. Know how much alcohol is in your drink. In the [place] one drink equals one 12 oz bottle of beer (355 mL), one 5 oz glass of wine (148 mL), or one 1 oz glass of hard liquor (44 mL). Do not use any products that contain nicotine or tobacco. These products include cigarettes, chewing tobacco, and vaping devices, such as e-cigarettes. If you need help quitting, ask your health care provider. Activity  Follow a regular exercise program to stay fit. This will help you maintain your balance. Ask your health care provider what types of exercise are appropriate for you. If you need a cane or walker, use it as recommended by your health care provider. Wear supportive shoes that have nonskid soles. Safety  Remove any tripping hazards, such as rugs, cords, and clutter. Install safety equipment such as grab bars in bathrooms and safety rails on stairs. Keep rooms and walkways well-lit. General instructions Talk with your health care provider about your risks for falling. Tell your health care provider if: You fall. Be sure to tell your health care provider about all falls, even ones that seem minor. You feel dizzy, tiredness (fatigue), or off-balance. Take over-the-counter and prescription medicines only as told by your health care provider. These include supplements. Eat a healthy diet and maintain a healthy weight. A healthy diet includes low-fat dairy products, low-fat (lean) meats, and fiber from whole grains, beans, and lots of fruits and vegetables. Stay current with your vaccines. Schedule regular health, dental, and eye exams. Summary Having a healthy lifestyle and getting preventive care can help to protect your health and wellness after age 37. Screening and testing are the best way to find a health problem early and help you avoid  having a fall. Early diagnosis and treatment give you the best chance for managing medical conditions that are more common for people who are older than age 25. Falls are a major cause of broken bones and head injuries in people who are older than age 61. Take precautions to prevent a fall at home. Work with your health care provider to learn what changes you can make to improve your health and wellness and to prevent falls. This information is not intended to replace advice given to you by your health care provider. Make sure you discuss any questions you have with your health care provider. Document Revised: 04/12/2021 Document Reviewed: 04/12/2021 Elsevier Patient Education  2024 Elsevier Inc.     Emil Schaumann, MD [facility]

## 2024-09-04 NOTE — Assessment & Plan Note (Signed)
 Clinically stable and asymptomatic Continues Nexium as needed

## 2024-09-05 ENCOUNTER — Ambulatory Visit: Payer: Self-pay | Admitting: Emergency Medicine

## 2024-09-05 LAB — COMPREHENSIVE METABOLIC PANEL WITH GFR
ALT: 14 U/L (ref 0–53)
AST: 16 U/L (ref 0–37)
Albumin: 4.2 g/dL (ref 3.5–5.2)
Alkaline Phosphatase: 55 U/L (ref 39–117)
BUN: 17 mg/dL (ref 6–23)
CO2: 22 meq/L (ref 19–32)
Calcium: 9.5 mg/dL (ref 8.4–10.5)
Chloride: 105 meq/L (ref 96–112)
Creatinine, Ser: 1.32 mg/dL (ref 0.40–1.50)
GFR: 51.63 mL/min — ABNORMAL LOW (ref >60.00)
Glucose, Bld: 85 mg/dL (ref 70–99)
Potassium: 4.3 meq/L (ref 3.5–5.1)
Sodium: 144 meq/L (ref 135–145)
Total Bilirubin: 0.6 mg/dL (ref 0.2–1.2)
Total Protein: 6.3 g/dL (ref 6.0–8.3)

## 2024-09-05 LAB — LIPID PANEL
Cholesterol: 151 mg/dL (ref 0–200)
HDL: 62 mg/dL (ref >39.00)
LDL Cholesterol: 74 mg/dL (ref 0–99)
NonHDL: 88.77
Total CHOL/HDL Ratio: 2
Triglycerides: 73 mg/dL (ref 0.0–149.0)
VLDL: 14.6 mg/dL (ref 0.0–40.0)

## 2024-09-05 LAB — CBC WITH DIFFERENTIAL/PLATELET
Basophils Absolute: 0 K/uL (ref 0.0–0.1)
Basophils Relative: 1.4 % (ref 0.0–3.0)
Eosinophils Absolute: 0.2 K/uL (ref 0.0–0.7)
Eosinophils Relative: 5.8 % — ABNORMAL HIGH (ref 0.0–5.0)
HCT: 38.3 % — ABNORMAL LOW (ref 39.0–52.0)
Hemoglobin: 12.7 g/dL — ABNORMAL LOW (ref 13.0–17.0)
Lymphocytes Relative: 33.4 % (ref 12.0–46.0)
Lymphs Abs: 1.1 K/uL (ref 0.7–4.0)
MCHC: 33.1 g/dL (ref 30.0–36.0)
MCV: 82.2 fl (ref 78.0–100.0)
Monocytes Absolute: 0.3 K/uL (ref 0.1–1.0)
Monocytes Relative: 9.7 % (ref 3.0–12.0)
Neutro Abs: 1.6 K/uL (ref 1.4–7.7)
Neutrophils Relative %: 49.7 % (ref 43.0–77.0)
Platelets: 183 K/uL (ref 150.0–400.0)
RBC: 4.67 Mil/uL (ref 4.22–5.81)
RDW: 14 % (ref 11.5–15.5)
WBC: 3.2 K/uL — ABNORMAL LOW (ref 4.0–10.5)

## 2024-09-05 LAB — HEMOGLOBIN A1C: Hgb A1c MFr Bld: 5.6 % (ref 4.6–6.5)

## 2024-09-05 LAB — PSA: PSA: 0 ng/mL — ABNORMAL LOW (ref 0.10–4.00)

## 2024-09-19 ENCOUNTER — Inpatient Hospital Stay: Payer: Medicare Other | Attending: Oncology

## 2024-09-19 DIAGNOSIS — D709 Neutropenia, unspecified: Secondary | ICD-10-CM | POA: Insufficient documentation

## 2024-09-19 DIAGNOSIS — D508 Other iron deficiency anemias: Secondary | ICD-10-CM

## 2024-09-19 DIAGNOSIS — D72819 Decreased white blood cell count, unspecified: Secondary | ICD-10-CM

## 2024-09-19 DIAGNOSIS — D509 Iron deficiency anemia, unspecified: Secondary | ICD-10-CM | POA: Diagnosis not present

## 2024-09-19 LAB — IRON AND TIBC
Iron: 82 ug/dL (ref 45–182)
Saturation Ratios: 26 % (ref 17.9–39.5)
TIBC: 312 ug/dL (ref 250–450)
UIBC: 231 ug/dL

## 2024-09-19 LAB — COMPREHENSIVE METABOLIC PANEL WITH GFR
ALT: 21 U/L (ref 0–44)
AST: 21 U/L (ref 15–41)
Albumin: 4.1 g/dL (ref 3.5–5.0)
Alkaline Phosphatase: 66 U/L (ref 38–126)
Anion gap: 8 (ref 5–15)
BUN: 14 mg/dL (ref 8–23)
CO2: 29 mmol/L (ref 22–32)
Calcium: 9.1 mg/dL (ref 8.9–10.3)
Chloride: 104 mmol/L (ref 98–111)
Creatinine, Ser: 1.37 mg/dL — ABNORMAL HIGH (ref 0.61–1.24)
GFR, Estimated: 53 mL/min — ABNORMAL LOW (ref >60)
Glucose, Bld: 92 mg/dL (ref 70–99)
Potassium: 4.4 mmol/L (ref 3.5–5.1)
Sodium: 141 mmol/L (ref 135–145)
Total Bilirubin: 0.6 mg/dL (ref 0.0–1.2)
Total Protein: 6 g/dL — ABNORMAL LOW (ref 6.5–8.1)

## 2024-09-19 LAB — CBC WITH DIFFERENTIAL/PLATELET
Abs Immature Granulocytes: 0 K/uL (ref 0.00–0.07)
Basophils Absolute: 0.1 K/uL (ref 0.0–0.1)
Basophils Relative: 2 %
Eosinophils Absolute: 0.2 K/uL (ref 0.0–0.5)
Eosinophils Relative: 6 %
HCT: 38.6 % — ABNORMAL LOW (ref 39.0–52.0)
Hemoglobin: 12.6 g/dL — ABNORMAL LOW (ref 13.0–17.0)
Immature Granulocytes: 0 %
Lymphocytes Relative: 32 %
Lymphs Abs: 1.1 K/uL (ref 0.7–4.0)
MCH: 28.1 pg (ref 26.0–34.0)
MCHC: 32.6 g/dL (ref 30.0–36.0)
MCV: 86 fL (ref 80.0–100.0)
Monocytes Absolute: 0.3 K/uL (ref 0.1–1.0)
Monocytes Relative: 9 %
Neutro Abs: 1.8 K/uL (ref 1.7–7.7)
Neutrophils Relative %: 51 %
Platelets: 180 K/uL (ref 150–400)
RBC: 4.49 MIL/uL (ref 4.22–5.81)
RDW: 13 % (ref 11.5–15.5)
WBC: 3.5 K/uL — ABNORMAL LOW (ref 4.0–10.5)
nRBC: 0 % (ref 0.0–0.2)

## 2024-09-19 LAB — FERRITIN: Ferritin: 51 ng/mL (ref 24–336)

## 2024-09-26 ENCOUNTER — Ambulatory Visit: Payer: Medicare Other | Admitting: Hematology

## 2024-09-26 ENCOUNTER — Inpatient Hospital Stay: Payer: Medicare Other | Admitting: Oncology

## 2024-09-26 NOTE — Assessment & Plan Note (Addendum)
-   Previous workup for nutritional deficiencies, connective tissue disorders and plasma cell disorders was negative. - Denied any recurrent infections. - Labs from 1016 show creatinine 1.37 with normal LFTs.  White count is 3.5 with ANC of 1.8.  Hemoglobin is mildly low at 12.6 platelets are normal.  Ferritin is 51.  Iron saturation 26%. -I would recommend taking iron supplement.

## 2024-09-26 NOTE — Progress Notes (Unsigned)
 [facility] OFFICE PROGRESS NOTE  Omar Emil Schanz, MD  I connected with Omar [name]. on 09/26/24 at  1:30 PM EDT by {Blank single:19197::video enabled telemedicine visit,telephone visit} and verified that I am speaking with the correct person using two identifiers.   I discussed the limitations, risks, security and privacy concerns of performing an evaluation and management service by telemedicine and the availability of in-person appointments. I also discussed with the patient that there may be a patient responsible charge related to this service. The patient expressed understanding and agreed to proceed.   Other persons participating in the visit and their role in the encounter: NP, Patient   Patient's location: Home  Provider's location: Clinic   ASSESSMENT & PLAN:    Assessment & Plan Other neutropenia - Previous workup for nutritional deficiencies, connective tissue disorders and plasma cell disorders was negative. - Denied any recurrent infections. - Labs from 1016 show creatinine 1.37 with normal LFTs.  White count is 3.5 with ANC of 1.8.  Hemoglobin is mildly low at 12.6 platelets are normal.  Ferritin is 51.  Iron saturation 26%. -I would recommend taking iron supplement.  Leukopenia, unspecified type  Other iron deficiency anemia   No orders of the defined types were placed in this encounter.   INTERVAL HISTORY: Omar [name]., a 78 y.o. male, seen for follow-up of leukopenia.  He also has dementia.  He is not taking Marinol  for more than 6 months.  He and his family denies any infections in the past 6 months.  Appetite is 50% and energy levels are 75%.   We reviewed CBC, CMP, iron panel and ferritin.  SUMMARY OF HEMATOLOGIC HISTORY: Oncology History   No history exists.     CBC    Component Value Date/Time   WBC 3.5 (L) 09/19/2024 1334   RBC 4.49 09/19/2024 1334   HGB 12.6 (L) 09/19/2024 1334   HGB 14.0 05/26/2020 1506    HCT 38.6 (L) 09/19/2024 1334   HCT 42.4 07/16/2021 1158   PLT 180 09/19/2024 1334   PLT 173 05/26/2020 1506   MCV 86.0 09/19/2024 1334   MCV 83 05/26/2020 1506   MCH 28.1 09/19/2024 1334   MCHC 32.6 09/19/2024 1334   RDW 13.0 09/19/2024 1334   RDW 13.0 05/26/2020 1506   LYMPHSABS 1.1 09/19/2024 1334   LYMPHSABS 1.1 05/26/2020 1506   MONOABS 0.3 09/19/2024 1334   EOSABS 0.2 09/19/2024 1334   EOSABS 0.2 05/26/2020 1506   BASOSABS 0.1 09/19/2024 1334   BASOSABS 0.1 05/26/2020 1506       Latest Ref Rng & Units 09/19/2024    1:34 PM 09/04/2024    4:11 PM 09/27/2023    1:13 PM  CMP  Glucose 70 - 99 mg/dL 92  85  96   BUN 8 - 23 mg/dL 14  17  14    Creatinine 0.61 - 1.24 mg/dL 8.62  8.67  8.69   Sodium 135 - 145 mmol/L 141  144  136   Potassium 3.5 - 5.1 mmol/L 4.4  4.3  3.4   Chloride 98 - 111 mmol/L 104  105  100   CO2 22 - 32 mmol/L 29  22  30    Calcium  8.9 - 10.3 mg/dL 9.1  9.5  8.8   Total Protein 6.5 - 8.1 g/dL 6.0  6.3  6.2   Total Bilirubin 0.0 - 1.2 mg/dL 0.6  0.6  0.8   Alkaline Phos 38 -  126 U/L 66  55  58   AST 15 - 41 U/L 21  16  17    ALT 0 - 44 U/L 21  14  21       Lab Results  Component Value Date   FERRITIN 51 09/19/2024   VITAMINB12 482 09/27/2023    There were no vitals filed for this visit.  Review of System:  Review of Systems  Unable to perform ROS: Dementia    Physical Exam: Physical Exam Neurological:     Mental Status: He is disoriented.     I provided *** minutes of {Blank single:19197::face-to-face video visit time,non face-to-face telephone visit time} during this encounter, and > 50% was spent counseling as documented under my assessment & plan.  Omar Hope, NP 09/26/2024 12:32 PM

## 2024-10-09 ENCOUNTER — Other Ambulatory Visit: Payer: Self-pay | Admitting: Emergency Medicine

## 2024-10-28 DIAGNOSIS — E785 Hyperlipidemia, unspecified: Secondary | ICD-10-CM | POA: Diagnosis not present

## 2024-10-28 DIAGNOSIS — G309 Alzheimer's disease, unspecified: Secondary | ICD-10-CM | POA: Diagnosis not present

## 2024-10-28 DIAGNOSIS — M2040 Other hammer toe(s) (acquired), unspecified foot: Secondary | ICD-10-CM | POA: Diagnosis not present

## 2024-10-28 DIAGNOSIS — Z8249 Family history of ischemic heart disease and other diseases of the circulatory system: Secondary | ICD-10-CM | POA: Diagnosis not present

## 2024-10-28 DIAGNOSIS — I2510 Atherosclerotic heart disease of native coronary artery without angina pectoris: Secondary | ICD-10-CM | POA: Diagnosis not present

## 2024-10-28 DIAGNOSIS — N189 Chronic kidney disease, unspecified: Secondary | ICD-10-CM | POA: Diagnosis not present

## 2024-10-28 DIAGNOSIS — K219 Gastro-esophageal reflux disease without esophagitis: Secondary | ICD-10-CM | POA: Diagnosis not present

## 2024-10-28 DIAGNOSIS — Z87891 Personal history of nicotine dependence: Secondary | ICD-10-CM | POA: Diagnosis not present

## 2024-10-28 DIAGNOSIS — I129 Hypertensive chronic kidney disease with stage 1 through stage 4 chronic kidney disease, or unspecified chronic kidney disease: Secondary | ICD-10-CM | POA: Diagnosis not present

## 2024-11-15 ENCOUNTER — Other Ambulatory Visit: Payer: Self-pay | Admitting: Neurology

## 2024-11-20 NOTE — Telephone Encounter (Signed)
 Last seen on 01/13/22 No follow up scheduled   Rx denied, Rx can be filled with PCP.

## 2024-12-23 ENCOUNTER — Ambulatory Visit: Payer: Medicare HMO

## 2024-12-24 ENCOUNTER — Ambulatory Visit
